# Patient Record
Sex: Male | Born: 1945
Health system: Southern US, Community
[De-identification: ages and names within clinical notes are randomized; demographics above are authoritative.]

## PROBLEM LIST (undated history)

## (undated) DIAGNOSIS — I4510 Unspecified right bundle-branch block: Secondary | ICD-10-CM

## (undated) DIAGNOSIS — M1990 Unspecified osteoarthritis, unspecified site: Secondary | ICD-10-CM

## (undated) DIAGNOSIS — C9000 Multiple myeloma not having achieved remission: Secondary | ICD-10-CM

## (undated) DIAGNOSIS — I5022 Chronic systolic (congestive) heart failure: Secondary | ICD-10-CM

## (undated) DIAGNOSIS — I10 Essential (primary) hypertension: Secondary | ICD-10-CM

## (undated) DIAGNOSIS — D472 Monoclonal gammopathy: Secondary | ICD-10-CM

## (undated) DIAGNOSIS — H269 Unspecified cataract: Secondary | ICD-10-CM

## (undated) DIAGNOSIS — E785 Hyperlipidemia, unspecified: Secondary | ICD-10-CM

## (undated) DIAGNOSIS — R55 Syncope and collapse: Secondary | ICD-10-CM

## (undated) DIAGNOSIS — Z950 Presence of cardiac pacemaker: Secondary | ICD-10-CM

## (undated) DIAGNOSIS — K219 Gastro-esophageal reflux disease without esophagitis: Secondary | ICD-10-CM

## (undated) DIAGNOSIS — D649 Anemia, unspecified: Secondary | ICD-10-CM

## (undated) DIAGNOSIS — D500 Iron deficiency anemia secondary to blood loss (chronic): Secondary | ICD-10-CM

## (undated) HISTORY — DX: Multiple myeloma not having achieved remission: C90.00

## (undated) HISTORY — DX: Anemia, unspecified: D64.9

## (undated) HISTORY — DX: Hyperlipidemia, unspecified: E78.5

## (undated) HISTORY — DX: Unspecified right bundle-branch block: I45.10

## (undated) HISTORY — DX: Gastro-esophageal reflux disease without esophagitis: K21.9

## (undated) HISTORY — DX: Monoclonal gammopathy: D47.2

## (undated) HISTORY — DX: Syncope and collapse: R55

## (undated) HISTORY — PX: EYE SURGERY: SHX253

## (undated) HISTORY — DX: Unspecified cataract: H26.9

## (undated) HISTORY — DX: Unspecified osteoarthritis, unspecified site: M19.90

## (undated) HISTORY — DX: Essential (primary) hypertension: I10

---
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
## 2009-11-08 ENCOUNTER — Ambulatory Visit: Payer: Self-pay | Admitting: Oncology

## 2009-11-19 LAB — CBC WITH DIFFERENTIAL/PLATELET
BASO%: 0.3 % (ref 0.0–2.0)
EOS%: 5.5 % (ref 0.0–7.0)
HCT: 36.5 % — ABNORMAL LOW (ref 38.4–49.9)
LYMPH%: 38.8 % (ref 14.0–49.0)
MCH: 28.6 pg (ref 27.2–33.4)
MCHC: 33.2 g/dL (ref 32.0–36.0)
MCV: 86.3 fL (ref 79.3–98.0)
MONO%: 6.6 % (ref 0.0–14.0)
NEUT%: 48.8 % (ref 39.0–75.0)
Platelets: 245 10*3/uL (ref 140–400)
RBC: 4.23 10*6/uL (ref 4.20–5.82)

## 2009-11-23 ENCOUNTER — Ambulatory Visit (HOSPITAL_COMMUNITY): Admission: RE | Admit: 2009-11-23 | Discharge: 2009-11-23 | Payer: Self-pay | Admitting: Oncology

## 2009-11-24 LAB — SPEP & IFE WITH QIG
Albumin ELP: 54 % — ABNORMAL LOW (ref 55.8–66.1)
Alpha-1-Globulin: 3.6 % (ref 2.9–4.9)
Beta 2: 5.4 % (ref 3.2–6.5)
Beta Globulin: 7.9 % — ABNORMAL HIGH (ref 4.7–7.2)
Total Protein, Serum Electrophoresis: 7.1 g/dL (ref 6.0–8.3)

## 2009-11-24 LAB — COMPREHENSIVE METABOLIC PANEL
ALT: 19 U/L (ref 0–53)
AST: 17 U/L (ref 0–37)
Alkaline Phosphatase: 42 U/L (ref 39–117)
CO2: 24 mEq/L (ref 19–32)
Creatinine, Ser: 1.02 mg/dL (ref 0.40–1.50)
Total Bilirubin: 0.3 mg/dL (ref 0.3–1.2)

## 2009-11-24 LAB — KAPPA/LAMBDA LIGHT CHAINS
Kappa free light chain: 2.04 mg/dL — ABNORMAL HIGH (ref 0.33–1.94)
Lambda Free Lght Chn: 1.52 mg/dL (ref 0.57–2.63)

## 2009-11-24 LAB — BETA 2 MICROGLOBULIN, SERUM: Beta-2 Microglobulin: 1.27 mg/L (ref 1.01–1.73)

## 2009-11-24 LAB — UIFE/LIGHT CHAINS/TP QN, 24-HR UR
Albumin, U: DETECTED
Alpha 1, Urine: DETECTED — AB
Alpha 2, Urine: DETECTED — AB
Free Kappa Lt Chains,Ur: 2.07 mg/dL — ABNORMAL HIGH (ref 0.04–1.51)
Free Kappa/Lambda Ratio: 13.8 ratio — ABNORMAL HIGH (ref 0.46–4.00)
Free Lambda Excretion/Day: 5.33 mg/d
Gamma Globulin, Urine: DETECTED — AB
Total Protein, Urine: 3.8 mg/dL

## 2009-11-24 LAB — VITAMIN B12: Vitamin B-12: 740 pg/mL (ref 211–911)

## 2009-11-24 LAB — IRON AND TIBC
%SAT: 17 % — ABNORMAL LOW (ref 20–55)
TIBC: 411 ug/dL (ref 215–435)
UIBC: 342 ug/dL

## 2009-11-24 LAB — FOLATE: Folate: >20 ng/mL

## 2009-12-13 ENCOUNTER — Ambulatory Visit: Payer: Self-pay | Admitting: Oncology

## 2009-12-15 LAB — CBC WITH DIFFERENTIAL/PLATELET
Basophils Absolute: 0.1 10*3/uL (ref 0.0–0.1)
Eosinophils Absolute: 0.5 10*3/uL (ref 0.0–0.5)
HGB: 11.8 g/dL — ABNORMAL LOW (ref 13.0–17.1)
MCV: 88.2 fL (ref 79.3–98.0)
MONO#: 0.6 10*3/uL (ref 0.1–0.9)
MONO%: 9.3 % (ref 0.0–14.0)
NEUT#: 2.9 10*3/uL (ref 1.5–6.5)
RBC: 4.03 10*6/uL — ABNORMAL LOW (ref 4.20–5.82)
RDW: 14 % (ref 11.0–14.6)
WBC: 6.4 10*3/uL (ref 4.0–10.3)

## 2010-01-31 ENCOUNTER — Ambulatory Visit: Payer: Self-pay | Admitting: Oncology

## 2010-03-21 ENCOUNTER — Ambulatory Visit: Payer: Self-pay | Admitting: Oncology

## 2010-03-23 LAB — COMPREHENSIVE METABOLIC PANEL
Albumin: 3.9 g/dL (ref 3.5–5.2)
BUN: 15 mg/dL (ref 6–23)
CO2: 27 mEq/L (ref 19–32)
Calcium: 9.4 mg/dL (ref 8.4–10.5)
Chloride: 102 mEq/L (ref 96–112)
Creatinine, Ser: 0.94 mg/dL (ref 0.40–1.50)
Glucose, Bld: 122 mg/dL — ABNORMAL HIGH (ref 70–99)
Potassium: 3.9 mEq/L (ref 3.5–5.3)

## 2010-03-23 LAB — CBC WITH DIFFERENTIAL/PLATELET
Basophils Absolute: 0 10*3/uL (ref 0.0–0.1)
EOS%: 3.3 % (ref 0.0–7.0)
Eosinophils Absolute: 0.3 10*3/uL (ref 0.0–0.5)
HCT: 39.3 % (ref 38.4–49.9)
HGB: 12.7 g/dL — ABNORMAL LOW (ref 13.0–17.1)
MCH: 28.3 pg (ref 27.2–33.4)
MONO#: 0.7 10*3/uL (ref 0.1–0.9)
NEUT#: 5.2 10*3/uL (ref 1.5–6.5)
NEUT%: 60.9 % (ref 39.0–75.0)
RDW: 14.9 % — ABNORMAL HIGH (ref 11.0–14.6)
lymph#: 2.3 10*3/uL (ref 0.9–3.3)

## 2010-03-25 LAB — PROTEIN ELECTROPHORESIS, SERUM
Albumin ELP: 51.8 % — ABNORMAL LOW (ref 55.8–66.1)
Alpha-1-Globulin: 6.9 % — ABNORMAL HIGH (ref 2.9–4.9)
Alpha-2-Globulin: 10.5 % (ref 7.1–11.8)
Gamma Globulin: 18.7 % (ref 11.1–18.8)
Total Protein, Serum Electrophoresis: 7.4 g/dL (ref 6.0–8.3)

## 2010-03-25 LAB — KAPPA/LAMBDA LIGHT CHAINS
Kappa free light chain: 1.95 mg/dL — ABNORMAL HIGH (ref 0.33–1.94)
Lambda Free Lght Chn: 1.72 mg/dL (ref 0.57–2.63)

## 2010-07-18 ENCOUNTER — Ambulatory Visit: Payer: Self-pay | Admitting: Oncology

## 2010-11-14 ENCOUNTER — Ambulatory Visit: Payer: Self-pay | Admitting: Oncology

## 2010-11-16 LAB — CBC WITH DIFFERENTIAL/PLATELET
Basophils Absolute: 0 10*3/uL (ref 0.0–0.1)
EOS%: 4.4 % (ref 0.0–7.0)
MCHC: 33.4 g/dL (ref 32.0–36.0)
MCV: 87.6 fL (ref 79.3–98.0)
NEUT#: 2.8 10*3/uL (ref 1.5–6.5)
NEUT%: 50.8 % (ref 39.0–75.0)
Platelets: 243 10*3/uL (ref 140–400)
RBC: 4.14 10*6/uL — ABNORMAL LOW (ref 4.20–5.82)
RDW: 14.4 % (ref 11.0–14.6)
WBC: 5.4 10*3/uL (ref 4.0–10.3)
lymph#: 1.9 10*3/uL (ref 0.9–3.3)

## 2010-11-18 LAB — PROTEIN ELECTROPHORESIS, SERUM
Albumin ELP: 55.7 % — ABNORMAL LOW (ref 55.8–66.1)
Alpha-1-Globulin: 3.4 % (ref 2.9–4.9)
Alpha-2-Globulin: 10.3 % (ref 7.1–11.8)
Total Protein, Serum Electrophoresis: 7.1 g/dL (ref 6.0–8.3)

## 2010-11-18 LAB — COMPREHENSIVE METABOLIC PANEL
AST: 16 U/L (ref 0–37)
BUN: 17 mg/dL (ref 6–23)
CO2: 26 mEq/L (ref 19–32)
Calcium: 9.3 mg/dL (ref 8.4–10.5)
Chloride: 97 mEq/L (ref 96–112)
Creatinine, Ser: 0.91 mg/dL (ref 0.40–1.50)

## 2010-11-18 LAB — KAPPA/LAMBDA LIGHT CHAINS
Kappa free light chain: 1.81 mg/dL (ref 0.33–1.94)
Kappa:Lambda Ratio: 1.14 (ref 0.26–1.65)
Lambda Free Lght Chn: 1.59 mg/dL (ref 0.57–2.63)

## 2011-02-06 ENCOUNTER — Other Ambulatory Visit: Payer: Self-pay | Admitting: Oncology

## 2011-02-06 ENCOUNTER — Encounter (HOSPITAL_BASED_OUTPATIENT_CLINIC_OR_DEPARTMENT_OTHER): Payer: Medicare Other | Admitting: Oncology

## 2011-02-06 DIAGNOSIS — D6489 Other specified anemias: Secondary | ICD-10-CM

## 2011-02-06 DIAGNOSIS — D649 Anemia, unspecified: Secondary | ICD-10-CM

## 2011-02-06 DIAGNOSIS — D472 Monoclonal gammopathy: Secondary | ICD-10-CM

## 2011-02-06 LAB — CBC WITH DIFFERENTIAL/PLATELET
BASO%: 0.8 % (ref 0.0–2.0)
HCT: 37 % — ABNORMAL LOW (ref 38.4–49.9)
HGB: 12.3 g/dL — ABNORMAL LOW (ref 13.0–17.1)
MCHC: 33.3 g/dL (ref 32.0–36.0)
MONO#: 0.5 10*3/uL (ref 0.1–0.9)
NEUT%: 49 % (ref 39.0–75.0)
RDW: 14.6 % (ref 11.0–14.6)
WBC: 5.8 10*3/uL (ref 4.0–10.3)
lymph#: 2.1 10*3/uL (ref 0.9–3.3)

## 2011-02-08 LAB — COMPREHENSIVE METABOLIC PANEL
AST: 19 U/L (ref 0–37)
Albumin: 4.1 g/dL (ref 3.5–5.2)
Alkaline Phosphatase: 50 U/L (ref 39–117)
Potassium: 4.5 mEq/L (ref 3.5–5.3)
Sodium: 138 mEq/L (ref 135–145)
Total Protein: 6.9 g/dL (ref 6.0–8.3)

## 2011-02-08 LAB — PROTEIN ELECTROPHORESIS, SERUM
Beta 2: 5.1 % (ref 3.2–6.5)
Beta Globulin: 7.4 % — ABNORMAL HIGH (ref 4.7–7.2)
Gamma Globulin: 19.2 % — ABNORMAL HIGH (ref 11.1–18.8)

## 2011-05-08 ENCOUNTER — Encounter (HOSPITAL_BASED_OUTPATIENT_CLINIC_OR_DEPARTMENT_OTHER): Payer: Medicare Other | Admitting: Oncology

## 2011-05-08 ENCOUNTER — Other Ambulatory Visit: Payer: Self-pay | Admitting: Oncology

## 2011-05-08 DIAGNOSIS — D6489 Other specified anemias: Secondary | ICD-10-CM

## 2011-05-08 DIAGNOSIS — D472 Monoclonal gammopathy: Secondary | ICD-10-CM

## 2011-05-08 DIAGNOSIS — D649 Anemia, unspecified: Secondary | ICD-10-CM

## 2011-05-08 LAB — CBC WITH DIFFERENTIAL/PLATELET
BASO%: 0.4 % (ref 0.0–2.0)
Basophils Absolute: 0 10*3/uL (ref 0.0–0.1)
HCT: 38.4 % (ref 38.4–49.9)
HGB: 12.9 g/dL — ABNORMAL LOW (ref 13.0–17.1)
LYMPH%: 30.6 % (ref 14.0–49.0)
MCH: 29 pg (ref 27.2–33.4)
MCHC: 33.6 g/dL (ref 32.0–36.0)
MONO#: 0.5 10*3/uL (ref 0.1–0.9)
NEUT%: 58 % (ref 39.0–75.0)
Platelets: 220 10*3/uL (ref 140–400)
WBC: 6.2 10*3/uL (ref 4.0–10.3)

## 2011-05-10 LAB — PROTEIN ELECTROPHORESIS, SERUM
Albumin ELP: 53.9 % — ABNORMAL LOW (ref 55.8–66.1)
Alpha-1-Globulin: 3.5 % (ref 2.9–4.9)
Alpha-2-Globulin: 10.5 % (ref 7.1–11.8)
Beta Globulin: 7.3 % — ABNORMAL HIGH (ref 4.7–7.2)
Total Protein, Serum Electrophoresis: 7.4 g/dL (ref 6.0–8.3)

## 2011-05-10 LAB — KAPPA/LAMBDA LIGHT CHAINS
Kappa free light chain: 2.14 mg/dL — ABNORMAL HIGH (ref 0.33–1.94)
Kappa:Lambda Ratio: 1.37 (ref 0.26–1.65)
Lambda Free Lght Chn: 1.56 mg/dL (ref 0.57–2.63)

## 2011-05-10 LAB — COMPREHENSIVE METABOLIC PANEL
ALT: 17 U/L (ref 0–53)
BUN: 13 mg/dL (ref 6–23)
CO2: 19 mEq/L (ref 19–32)
Calcium: 9.6 mg/dL (ref 8.4–10.5)
Chloride: 102 mEq/L (ref 96–112)
Creatinine, Ser: 0.88 mg/dL (ref 0.40–1.50)
Glucose, Bld: 167 mg/dL — ABNORMAL HIGH (ref 70–99)
Total Bilirubin: 0.4 mg/dL (ref 0.3–1.2)

## 2011-08-07 ENCOUNTER — Other Ambulatory Visit: Payer: Self-pay | Admitting: Oncology

## 2011-08-07 ENCOUNTER — Encounter (HOSPITAL_BASED_OUTPATIENT_CLINIC_OR_DEPARTMENT_OTHER): Payer: Medicare Other | Admitting: Oncology

## 2011-08-07 DIAGNOSIS — D649 Anemia, unspecified: Secondary | ICD-10-CM

## 2011-08-07 DIAGNOSIS — D472 Monoclonal gammopathy: Secondary | ICD-10-CM

## 2011-08-07 DIAGNOSIS — D6489 Other specified anemias: Secondary | ICD-10-CM

## 2011-08-07 LAB — CBC WITH DIFFERENTIAL/PLATELET
Basophils Absolute: 0 10*3/uL (ref 0.0–0.1)
Eosinophils Absolute: 0.3 10*3/uL (ref 0.0–0.5)
HGB: 12.2 g/dL — ABNORMAL LOW (ref 13.0–17.1)
MCV: 86.8 fL (ref 79.3–98.0)
MONO%: 9.2 % (ref 0.0–14.0)
NEUT#: 3.1 10*3/uL (ref 1.5–6.5)
RBC: 4.19 10*6/uL — ABNORMAL LOW (ref 4.20–5.82)
RDW: 14.6 % (ref 11.0–14.6)
WBC: 6.1 10*3/uL (ref 4.0–10.3)
lymph#: 2.1 10*3/uL (ref 0.9–3.3)

## 2011-08-09 LAB — COMPREHENSIVE METABOLIC PANEL
AST: 21 U/L (ref 0–37)
Albumin: 3.7 g/dL (ref 3.5–5.2)
Alkaline Phosphatase: 45 U/L (ref 39–117)
BUN: 18 mg/dL (ref 6–23)
Calcium: 9.6 mg/dL (ref 8.4–10.5)
Chloride: 104 mEq/L (ref 96–112)
Glucose, Bld: 95 mg/dL (ref 70–99)
Potassium: 4.2 mEq/L (ref 3.5–5.3)
Sodium: 136 mEq/L (ref 135–145)
Total Protein: 6.9 g/dL (ref 6.0–8.3)

## 2011-08-09 LAB — PROTEIN ELECTROPHORESIS, SERUM
Albumin ELP: 51.4 % — ABNORMAL LOW (ref 55.8–66.1)
Beta 2: 5.7 % (ref 3.2–6.5)
Gamma Globulin: 19.4 % — ABNORMAL HIGH (ref 11.1–18.8)

## 2011-08-09 LAB — KAPPA/LAMBDA LIGHT CHAINS: Kappa free light chain: 3.92 mg/dL — ABNORMAL HIGH (ref 0.33–1.94)

## 2011-10-03 ENCOUNTER — Encounter: Payer: Self-pay | Admitting: *Deleted

## 2011-10-10 ENCOUNTER — Encounter: Payer: Self-pay | Admitting: Oncology

## 2011-10-10 DIAGNOSIS — K219 Gastro-esophageal reflux disease without esophagitis: Secondary | ICD-10-CM | POA: Insufficient documentation

## 2011-10-10 DIAGNOSIS — D472 Monoclonal gammopathy: Secondary | ICD-10-CM | POA: Insufficient documentation

## 2011-10-10 DIAGNOSIS — E0829 Diabetes mellitus due to underlying condition with other diabetic kidney complication: Secondary | ICD-10-CM | POA: Insufficient documentation

## 2011-10-10 DIAGNOSIS — I10 Essential (primary) hypertension: Secondary | ICD-10-CM | POA: Insufficient documentation

## 2011-10-10 DIAGNOSIS — E785 Hyperlipidemia, unspecified: Secondary | ICD-10-CM | POA: Insufficient documentation

## 2011-11-06 ENCOUNTER — Other Ambulatory Visit (HOSPITAL_BASED_OUTPATIENT_CLINIC_OR_DEPARTMENT_OTHER): Payer: Medicare Other | Admitting: Lab

## 2011-11-06 DIAGNOSIS — D472 Monoclonal gammopathy: Secondary | ICD-10-CM

## 2011-11-06 LAB — CBC WITH DIFFERENTIAL/PLATELET
BASO%: 0.3 % (ref 0.0–2.0)
Eosinophils Absolute: 0.4 10*3/uL (ref 0.0–0.5)
LYMPH%: 33.3 % (ref 14.0–49.0)
MCHC: 33.5 g/dL (ref 32.0–36.0)
MONO#: 0.5 10*3/uL (ref 0.1–0.9)
NEUT#: 3.7 10*3/uL (ref 1.5–6.5)
Platelets: 231 10*3/uL (ref 140–400)
RBC: 4.42 10*6/uL (ref 4.20–5.82)
RDW: 14.1 % (ref 11.0–14.6)
WBC: 6.9 10*3/uL (ref 4.0–10.3)
lymph#: 2.3 10*3/uL (ref 0.9–3.3)

## 2011-11-08 LAB — KAPPA/LAMBDA LIGHT CHAINS: Kappa free light chain: 2.36 mg/dL — ABNORMAL HIGH (ref 0.33–1.94)

## 2011-11-08 LAB — COMPREHENSIVE METABOLIC PANEL
ALT: 26 U/L (ref 0–53)
Albumin: 3.5 g/dL (ref 3.5–5.2)
CO2: 28 mEq/L (ref 19–32)
Potassium: 4.3 mEq/L (ref 3.5–5.3)
Sodium: 138 mEq/L (ref 135–145)
Total Bilirubin: 0.2 mg/dL — ABNORMAL LOW (ref 0.3–1.2)
Total Protein: 6.4 g/dL (ref 6.0–8.3)

## 2011-11-08 LAB — PROTEIN ELECTROPHORESIS, SERUM
Beta 2: 5.7 % (ref 3.2–6.5)
Beta Globulin: 7.5 % — ABNORMAL HIGH (ref 4.7–7.2)
M-Spike, %: 0.44 g/dL
Total Protein, Serum Electrophoresis: 6.4 g/dL (ref 6.0–8.3)

## 2011-11-13 ENCOUNTER — Ambulatory Visit (HOSPITAL_BASED_OUTPATIENT_CLINIC_OR_DEPARTMENT_OTHER): Payer: Medicare Other | Admitting: Oncology

## 2011-11-13 ENCOUNTER — Telehealth: Payer: Self-pay | Admitting: Oncology

## 2011-11-13 VITALS — BP 166/111 | HR 86 | Temp 97.0°F | Ht 75.5 in | Wt 234.9 lb

## 2011-11-13 DIAGNOSIS — D649 Anemia, unspecified: Secondary | ICD-10-CM

## 2011-11-13 DIAGNOSIS — R030 Elevated blood-pressure reading, without diagnosis of hypertension: Secondary | ICD-10-CM

## 2011-11-13 DIAGNOSIS — R0789 Other chest pain: Secondary | ICD-10-CM

## 2011-11-13 DIAGNOSIS — E119 Type 2 diabetes mellitus without complications: Secondary | ICD-10-CM

## 2011-11-13 DIAGNOSIS — Z87891 Personal history of nicotine dependence: Secondary | ICD-10-CM

## 2011-11-13 DIAGNOSIS — D509 Iron deficiency anemia, unspecified: Secondary | ICD-10-CM

## 2011-11-13 DIAGNOSIS — D472 Monoclonal gammopathy: Secondary | ICD-10-CM

## 2011-11-13 DIAGNOSIS — Z129 Encounter for screening for malignant neoplasm, site unspecified: Secondary | ICD-10-CM

## 2011-11-13 NOTE — Progress Notes (Signed)
[facility] OFFICE PROGRESS NOTE  DIAGNOSIS AND CURRENT THERAPIES:    1.  Monoclonal gammopathy of unknown significant (MGUS); on watchful observation. 2.  Iron deficiency anemia with negative GI work up.  He is on oral iron supplementation.  INTERVAL HISTORY: Michael Wiggins 65 y.o. male returns for regular follow up.  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  He has been having mild bilateral knee pain and occasional bilateral low anterior rib pain. He has good appetite, and has been having weight gain.  He denies cough, hemoptysis, chest pain, shortness of breath, hematemasis, abdominal pain, melena, hematochezia, hematuria, low back pain, bowel bladder incontinence, lower extremity weakness.  He has been having intermittent rash on the diet lateral Richard his nose and eyebrow for many years. This skin rash gets worse with dry weather and improves when he uses Vaseline.  He denies pleurisy, abdominal pain, swelling or pain in his finger joints.   MEDICAL HISTORY: Past Medical History  Diagnosis Date  . Diabetes mellitus   . Hyperlipidemia   . Hypertension   . Osteoarthritis   . MGUS (monoclonal gammopathy of unknown significance)   . GERD (gastroesophageal reflux disease)     SURGICAL HISTORY: No past surgical history on file.  MEDICATIONS: Current Outpatient Prescriptions  Medication Sig Dispense Refill  . atorvastatin (LIPITOR) 10 MG tablet Take 10 mg by mouth daily.        Marland Kitchen BENICAR HCT 40-12.5 MG per tablet       . CINNAMON PO Take 1,000 mg by mouth daily.        Marland Kitchen glipiZIDE (GLUCOTROL) 5 MG tablet Take 5 mg by mouth 2 (two) times daily before a meal.        . meloxicam (MOBIC) 15 MG tablet       . metFORMIN (GLUCOPHAGE) 1000 MG tablet Take 1,000 mg by mouth 2 (two) times daily with a meal.        . Multiple Vitamin (MULTIVITAMIN) tablet Take 1 tablet by mouth daily.        Marland Kitchen aspirin 81 MG tablet Take 81 mg by mouth daily.         . fish oil-omega-3 fatty acids 1000 MG capsule Take 1 capsule by mouth daily.          ALLERGIES:   has no known allergies.  REVIEW OF SYSTEMS:  The rest of the 14-point review of system was negative.   Filed Vitals:   11/13/11 1042  BP: 166/111  Pulse:   Temp:    Wt Readings from Last 3 Encounters:  11/13/11 234 lb 14.4 oz (106.55 kg)  11/16/10 226 lb 14.4 oz (102.921 kg)   ECOG Performance status: 0  PHYSICAL EXAMINATION:  General:  well-nourished in no acute distress.  Eyes:  no scleral icterus.  ENT:  There were no oropharyngeal lesions.  Neck was without thyromegaly.  Lymphatics:  Negative cervical, supraclavicular or axillary adenopathy.  Respiratory: lungs were clear bilaterally without wheezing or crackles.  Cardiovascular:  Regular rate and rhythm, S1/S2, without murmur, rub or gallop.  There was no pedal edema.  GI:  abdomen was soft, flat, nontender, nondistended, without organomegaly.  Muscoloskeletal:  no spinal tenderness of palpation of vertebral spine.  Skin exam showed dry silvery flaky skin with mild erythema in his bilateral nasal ridges and eyebrows.  Neuro exam was nonfocal.  Patient was able to get on and off exam table without  assistance.  Gait was normal.  Patient was alerted and oriented.  Attention was good.   Language was appropriate.  Mood was normal without depression.  Speech was not pressured.  Thought content was not tangential.    LABORATORY/RADIOLOGY DATA:  Lab Results  Component Value Date   WBC 6.9 11/06/2011   HGB 12.9* 11/06/2011   HCT 38.6 11/06/2011   PLT 231 11/06/2011   GLUCOSE 113* 11/06/2011   ALT 26 11/06/2011   AST 28 11/06/2011   NA 138 11/06/2011   K 4.3 11/06/2011   CL 103 11/06/2011   CREATININE 0.89 11/06/2011   BUN 14 11/06/2011   CO2 28 11/06/2011   TSH 1.135 11/19/2009   SPEP:  M-spike at 0.44; and normal kappa:lambda free light chain ratio.   ASSESSMENT AND PLAN:   1.  MGUS:  I discussed with Michael Wiggins that  there is no indication of MGUS progression into active myeloma given that his M spike is improving and normal light chain ratio. He does not have anemia, renal insufficiency or hypercalcemia to suggest active myeloma this time. I recommended again observation with lab test with M spike and light chains in 5 months with visit with me the week after.  2.  Iron deficiency anemia:  Negative colonoscopy and EGD.  He had some benign polyps.  His EGD showed some esophagitis.  His next screening colonoscopy is around 2015.  I advised him to continue his oral iron supplementation.   3.  GERD:  I advised him not to have to have heavy dinner in addition to give at least 3 hours between dinner and going to bed at night to improve his reflux symptoms.    4.  hyperlipidemia: He is on fish and atorvastatin.  5. Diabetes mellitus type 2 he is on metformin and glipizide per PCP.  6.  HTN:  His blood pressure was elevated today. He claims that this is whitecoat syndrome since at home his blood pressure is around 130/90. I advised him to check with his PCP to see whether his antihypertensive medication he to be titrated as appropriate.  7.  Follow up:  In 5 months.   8.  Primary care:  He has already had a flu shot this year.   He smoked up to 2 packs a day of cigarettes between the age of of 83 and 29.  His mother died from lung cancer. According to NCCN guideline, he is at higher risk of lung cancer.  Per NCCN guideline, he qualifies for low radiation CT chest for lung cancer screening.  I ordered this for within the next 1 month.

## 2011-11-13 NOTE — Telephone Encounter (Signed)
lmonvm advising the pt of his lab appt in April and the md appt in may 2013

## 2011-12-05 ENCOUNTER — Inpatient Hospital Stay (HOSPITAL_COMMUNITY)
Admission: RE | Admit: 2011-12-05 | Discharge: 2011-12-05 | Payer: BC Managed Care – PPO | Source: Ambulatory Visit | Attending: Oncology | Admitting: Oncology

## 2011-12-13 ENCOUNTER — Telehealth: Payer: Self-pay | Admitting: Oncology

## 2011-12-13 NOTE — Telephone Encounter (Signed)
Pt called to r/s his ct scan appt that he missed. gve the pt the number to rad scheduling

## 2011-12-14 NOTE — Progress Notes (Signed)
Patient cancelled last chest CT; left message with patient to call office to reschedule CT, per Dr. Gaylyn Rong.

## 2011-12-26 ENCOUNTER — Ambulatory Visit (HOSPITAL_COMMUNITY)
Admission: RE | Admit: 2011-12-26 | Discharge: 2011-12-26 | Disposition: A | Discharge status: Home / Self Care | Payer: Medicare Other | Source: Ambulatory Visit | Attending: Oncology | Admitting: Oncology

## 2011-12-26 DIAGNOSIS — Z129 Encounter for screening for malignant neoplasm, site unspecified: Secondary | ICD-10-CM

## 2011-12-26 DIAGNOSIS — Z87891 Personal history of nicotine dependence: Secondary | ICD-10-CM

## 2011-12-26 DIAGNOSIS — I2510 Atherosclerotic heart disease of native coronary artery without angina pectoris: Secondary | ICD-10-CM | POA: Insufficient documentation

## 2011-12-26 DIAGNOSIS — J438 Other emphysema: Secondary | ICD-10-CM | POA: Insufficient documentation

## 2011-12-26 DIAGNOSIS — R059 Cough, unspecified: Secondary | ICD-10-CM | POA: Insufficient documentation

## 2011-12-26 DIAGNOSIS — R0789 Other chest pain: Secondary | ICD-10-CM

## 2011-12-26 DIAGNOSIS — R05 Cough: Secondary | ICD-10-CM | POA: Insufficient documentation

## 2011-12-26 MED ORDER — IOHEXOL 300 MG/ML  SOLN
80.0000 mL | Freq: Once | INTRAMUSCULAR | Status: AC | PRN
  Administered 2011-12-26: 80 mL via INTRAVENOUS

## 2012-01-08 ENCOUNTER — Telehealth: Payer: Self-pay | Admitting: *Deleted

## 2012-01-08 ENCOUNTER — Ambulatory Visit (INDEPENDENT_AMBULATORY_CARE_PROVIDER_SITE_OTHER): Payer: Medicare Other | Admitting: Internal Medicine

## 2012-01-08 DIAGNOSIS — E782 Mixed hyperlipidemia: Secondary | ICD-10-CM

## 2012-01-08 DIAGNOSIS — E119 Type 2 diabetes mellitus without complications: Secondary | ICD-10-CM

## 2012-01-08 DIAGNOSIS — E8809 Other disorders of plasma-protein metabolism, not elsewhere classified: Secondary | ICD-10-CM

## 2012-01-08 DIAGNOSIS — Z79899 Other long term (current) drug therapy: Secondary | ICD-10-CM

## 2012-01-08 NOTE — Telephone Encounter (Signed)
Please call pt and let him know that the CT chest was clear.  No sign of lung cancer.  Thanks

## 2012-01-08 NOTE — Telephone Encounter (Signed)
Received call from pt stating that he had a ct chest done about 10 days ago and he would like to know the results. Informed pt that MD out of office until Wednesday but this nurse would notify MD via in basket of result request. Verbalized understanding

## 2012-01-11 ENCOUNTER — Telehealth: Payer: Self-pay | Admitting: *Deleted

## 2012-01-11 NOTE — Telephone Encounter (Signed)
Pt called back for results of CT scan.  Informed him that per Dr. Gaylyn Rong his CT chest was clear and no signs of lung cancer.  Pt verbalized understanding.

## 2012-01-18 ENCOUNTER — Telehealth: Payer: Self-pay

## 2012-01-18 NOTE — Telephone Encounter (Addendum)
.  [facility] PT WAS REFERRED TO ANOTHER DR AND WOULD LIKE TO SPEAK WITH SOMEONE WHAT THE FINDINGS WERE. PLEASE CALL [phone] IT WAS ABOUT HIS IRON AND ANEMIA LEVELS

## 2012-01-19 NOTE — Telephone Encounter (Signed)
Pt will return call. Chart in box behind TL

## 2012-01-19 NOTE — Telephone Encounter (Signed)
Called pt back as promised shortly after 5:00 pm, but no answer. LMOM at mobile and home to [facility].

## 2012-01-20 NOTE — Telephone Encounter (Signed)
Dr. Perrin Maltese wants to see the patient.

## 2012-01-20 NOTE — Telephone Encounter (Signed)
Pt came in and stated that he was actually trying to get in touch with Dr. Perrin Maltese regarding a butterfly shaped area of redness on his nose.  Dr. Gaylyn Rong noticed this and pt wanted Dr. Leodis Liverpool advice.  Also wanted [place] to know that Dr. Gaylyn Rong knows about his anemia since Oct 2012, and he is on Iron 30mg /day.  Pls advise.

## 2012-01-20 NOTE — Telephone Encounter (Signed)
LMOM for pt to call back.  ?Is question re: urology referral for rising PSA?  Please get questions.Marland KitchenMarland Kitchen

## 2012-01-21 NOTE — Telephone Encounter (Signed)
Patient notified and will rehcheck for this.

## 2012-02-26 ENCOUNTER — Telehealth: Payer: Self-pay

## 2012-02-26 NOTE — Telephone Encounter (Signed)
.  [facility] PT WOULD LIKE TO HAVE THE NEW SUGAR TEST ALONG WITH THE TEST SCRIPTS. STATES HIS IS SO OLD AND HE CAN GET IT FREE IF HE CAN GET THE NEW ONE PLEASE CALL [phone]  [facility] [address]

## 2012-02-27 NOTE — Telephone Encounter (Signed)
Who follows the pt's Diabetes - when was the last DM OV?  If not within 3 months - needs ov - if within last 3 months - what meter does he want?

## 2012-02-27 NOTE — Telephone Encounter (Signed)
Spoke with patient, last OV for DM was in September.  Patient states he will make appt to followup.  He was wanting the Accucheck Nano, but will check with his insurance first to see what they will cover.  He is currently testing once daily, and will call back after talking with insurance.

## 2012-02-27 NOTE — Telephone Encounter (Signed)
Can we rx new meter, strips and lancets for patient?

## 2012-03-16 ENCOUNTER — Other Ambulatory Visit: Payer: Self-pay | Admitting: Internal Medicine

## 2012-03-17 ENCOUNTER — Other Ambulatory Visit: Payer: Self-pay | Admitting: Physician Assistant

## 2012-03-17 MED ORDER — GLIPIZIDE 5 MG PO TABS: 5.0000 mg | ORAL_TABLET | Freq: Two times a day (BID) | ORAL | Status: DC

## 2012-03-17 NOTE — Telephone Encounter (Signed)
Pull chart please.  Michael Wiggins 

## 2012-03-18 ENCOUNTER — Other Ambulatory Visit: Payer: Self-pay

## 2012-03-18 ENCOUNTER — Other Ambulatory Visit: Payer: Self-pay | Admitting: *Deleted

## 2012-03-18 MED ORDER — ATORVASTATIN CALCIUM 20 MG PO TABS: 20.0000 mg | ORAL_TABLET | Freq: Every day | ORAL | Status: DC

## 2012-04-10 ENCOUNTER — Telehealth: Payer: Self-pay | Admitting: Oncology

## 2012-04-10 NOTE — Telephone Encounter (Signed)
Pt called to cx 4/30 lb appt and moved f/u to after 5/6 due to he is having a complete physical w/primay 5/6 and will have lbs from physical sent to [facility]. Lb for 4/30 cx per pt and f/u moved to 5/13. Message sent to desk nurse.

## 2012-04-11 ENCOUNTER — Other Ambulatory Visit: Payer: BC Managed Care – PPO | Admitting: Lab

## 2012-04-14 ENCOUNTER — Other Ambulatory Visit: Payer: Self-pay | Admitting: Physician Assistant

## 2012-04-18 ENCOUNTER — Ambulatory Visit: Payer: BC Managed Care – PPO | Admitting: Oncology

## 2012-04-29 ENCOUNTER — Ambulatory Visit: Payer: BC Managed Care – PPO | Admitting: Oncology

## 2012-05-06 ENCOUNTER — Other Ambulatory Visit: Payer: Self-pay | Admitting: Internal Medicine

## 2012-05-06 ENCOUNTER — Ambulatory Visit (INDEPENDENT_AMBULATORY_CARE_PROVIDER_SITE_OTHER): Payer: Medicare Other | Admitting: Internal Medicine

## 2012-05-06 ENCOUNTER — Encounter: Payer: Self-pay | Admitting: Internal Medicine

## 2012-05-06 VITALS — BP 159/95 | HR 82 | Temp 96.9°F | Resp 16 | Ht 73.5 in | Wt 222.0 lb

## 2012-05-06 DIAGNOSIS — E782 Mixed hyperlipidemia: Secondary | ICD-10-CM

## 2012-05-06 DIAGNOSIS — Z0000 Encounter for general adult medical examination without abnormal findings: Secondary | ICD-10-CM

## 2012-05-06 DIAGNOSIS — Z23 Encounter for immunization: Secondary | ICD-10-CM

## 2012-05-06 DIAGNOSIS — E119 Type 2 diabetes mellitus without complications: Secondary | ICD-10-CM

## 2012-05-06 DIAGNOSIS — I10 Essential (primary) hypertension: Secondary | ICD-10-CM

## 2012-05-06 DIAGNOSIS — Z79899 Other long term (current) drug therapy: Secondary | ICD-10-CM

## 2012-05-06 DIAGNOSIS — D649 Anemia, unspecified: Secondary | ICD-10-CM

## 2012-05-06 LAB — POCT URINALYSIS DIPSTICK
Bilirubin, UA: NEGATIVE
Glucose, UA: NEGATIVE
Ketones, UA: NEGATIVE
Leukocytes, UA: NEGATIVE
Spec Grav, UA: 1.01

## 2012-05-06 LAB — COMPREHENSIVE METABOLIC PANEL
ALT: 18 U/L (ref 0–53)
Albumin: 3.8 g/dL (ref 3.5–5.2)
CO2: 27 mEq/L (ref 19–32)
Calcium: 9.7 mg/dL (ref 8.4–10.5)
Chloride: 102 mEq/L (ref 96–112)
Glucose, Bld: 160 mg/dL — ABNORMAL HIGH (ref 70–99)
Potassium: 4.4 mEq/L (ref 3.5–5.3)
Sodium: 137 mEq/L (ref 135–145)
Total Bilirubin: 0.4 mg/dL (ref 0.3–1.2)
Total Protein: 6.8 g/dL (ref 6.0–8.3)

## 2012-05-06 LAB — GLUCOSE, POCT (MANUAL RESULT ENTRY): POC Glucose: 171 mg/dl — AB (ref 70–99)

## 2012-05-06 NOTE — Progress Notes (Signed)
  Subjective:    Patient ID: Michael Rabideau Sr., male    DOB: 10/04/1946, 66 y.o.   MRN: [id number]  HPI Recent stress test nl, chest CT scan also nl, Had bilateral catarract surgery and went well. See scanned hx  Diabetes is doing better, exercing more, lost weight Review of Systems See scanned ROS    Objective:   Physical Exam  Constitutional: He is oriented to person, place, and time. He appears well-developed and well-nourished.  HENT:  Right Ear: External ear normal.  Left Ear: External ear normal.  Mouth/Throat: Oropharynx is clear and moist.  Eyes: EOM are normal. Pupils are equal, round, and reactive to light.  Neck: Normal range of motion. Neck supple. No tracheal deviation present. No thyromegaly present.  Cardiovascular: Normal rate, regular rhythm, normal heart sounds and intact distal pulses.   No murmur heard. Pulmonary/Chest: Effort normal and breath sounds normal. No respiratory distress.  Abdominal: Soft. Bowel sounds are normal. He exhibits no mass. There is no tenderness.  Genitourinary: Rectum normal, prostate normal and penis normal.  Musculoskeletal: Normal range of motion. He exhibits tenderness.  Lymphadenopathy:    He has no cervical adenopathy.  Neurological: He is alert and oriented to person, place, and time. He displays normal reflexes. Coordination normal.  Skin: Skin is warm. No erythema.  Psychiatric: He has a normal mood and affect.   138/88  Results for orders placed in visit on 05/06/12  POCT URINALYSIS DIPSTICK      Component Value Range   Color, UA yellow     Clarity, UA clear     Glucose, UA neg     Bilirubin, UA neg     Ketones, UA neg     Spec Grav, UA 1.010     Blood, UA trace     pH, UA 7.0     Protein, UA 100     Urobilinogen, UA 0.2     Nitrite, UA neg     Leukocytes, UA Negative    GLUCOSE, POCT (MANUAL RESULT ENTRY)      Component Value Range   POC Glucose 171 (*) 70 - 99 (mg/dl)  POCT GLYCOSYLATED HEMOGLOBIN (HGB A1C)     Component Value Range   Hemoglobin A1C 8.6          Assessment & Plan:  Diabetes not to goal Glipyzide 10mg  am, 5mg  pm with meals RTC 1 month reck RF all meds 1 yr See nutrionist again

## 2012-05-08 ENCOUNTER — Other Ambulatory Visit: Payer: Self-pay

## 2012-05-08 MED ORDER — GLIPIZIDE 10 MG PO TABS: 10.0000 mg | ORAL_TABLET | Freq: Every morning | ORAL | Status: DC

## 2012-05-08 NOTE — Telephone Encounter (Signed)
[facility] Pt called and stated Dr Perrin Maltese wanted him to take 10 mg in the am and 5 mg at night. He only received 5 mg from pharmacy so Helmut Muster authorized verbally to send in separate RX for pt.

## 2012-05-10 ENCOUNTER — Encounter: Payer: Self-pay | Admitting: Oncology

## 2012-05-10 ENCOUNTER — Ambulatory Visit: Payer: BC Managed Care – PPO | Admitting: Oncology

## 2012-05-20 ENCOUNTER — Other Ambulatory Visit: Payer: Self-pay | Admitting: Internal Medicine

## 2012-06-03 ENCOUNTER — Other Ambulatory Visit: Payer: Self-pay | Admitting: Physician Assistant

## 2012-06-30 ENCOUNTER — Other Ambulatory Visit: Payer: Self-pay | Admitting: Internal Medicine

## 2012-07-08 ENCOUNTER — Ambulatory Visit (INDEPENDENT_AMBULATORY_CARE_PROVIDER_SITE_OTHER): Payer: Medicare Other | Admitting: Internal Medicine

## 2012-07-08 ENCOUNTER — Other Ambulatory Visit: Payer: Self-pay | Admitting: *Deleted

## 2012-07-08 ENCOUNTER — Encounter: Payer: Self-pay | Admitting: Internal Medicine

## 2012-07-08 VITALS — BP 153/90 | HR 63 | Temp 97.5°F | Resp 16 | Ht 73.5 in | Wt 220.8 lb

## 2012-07-08 DIAGNOSIS — E162 Hypoglycemia, unspecified: Secondary | ICD-10-CM

## 2012-07-08 DIAGNOSIS — Z79899 Other long term (current) drug therapy: Secondary | ICD-10-CM

## 2012-07-08 LAB — GLUCOSE, POCT (MANUAL RESULT ENTRY): POC Glucose: 148 mg/dl — AB (ref 70–99)

## 2012-07-08 MED ORDER — GLIPIZIDE 10 MG PO TABS: 10.0000 mg | ORAL_TABLET | Freq: Every morning | ORAL | Status: DC

## 2012-07-08 NOTE — Progress Notes (Signed)
  Subjective:    Patient ID: Michael Willis Sr., male    DOB: Oct 03, 1946, 66 y.o.   MRN: [id number]  HPI Having low blood sugar with 10mg  glypizide Understands how and when to use 10mg  or 5 mg. Works hard in the heat, lost wt. Not sleeping well   Review of Systems     Objective:   Physical Exam Normal  Results for orders placed in visit on 07/08/12  POCT GLYCOSYLATED HEMOGLOBIN (HGB A1C)      Component Value Range   Hemoglobin A1C 8.0    GLUCOSE, POCT (MANUAL RESULT ENTRY)      Component Value Range   POC Glucose 148 (*) 70 - 99 mg/dl   improved    Assessment & Plan:  Insomnia care Careful use of 10mg  glypizide RTC 4 mo RF meds 1 yr

## 2012-07-08 NOTE — Patient Instructions (Addendum)
Hypoglycemia (Low Blood Sugar) Hypoglycemia is when the glucose (sugar) in your blood is too low. Hypoglycemia can happen for many reasons. It can happen to people with or without diabetes. Hypoglycemia can develop quickly and can be a medical emergency.  CAUSES  Having hypoglycemia does not mean that you will develop diabetes. Different causes include:  Missed or delayed meals or not enough carbohydrates eaten.   Medication overdose. This could be by accident or deliberate. If by accident, your medication may need to be adjusted or changed.   Exercise or increased activity without adjustments in carbohydrates or medications.   A nerve disorder that affects body functions like your heart rate, blood pressure and digestion (autonomic neuropathy).   A condition where the stomach muscles do not function properly (gastroparesis). Therefore, medications may not absorb properly.   The inability to recognize the signs of hypoglycemia (hypoglycemic unawareness).   Absorption of insulin - may be altered.   Alcohol consumption.   Pregnancy/menstrual cycles/postpartum. This may be due to hormones.   Certain kinds of tumors. This is very rare.  SYMPTOMS   Sweating.   Hunger.   Dizziness.   Blurred vision.   Drowsiness.   Weakness.   Headache.   Rapid heart beat.   Shakiness.   Nervousness.  DIAGNOSIS  Diagnosis is made by monitoring blood glucose in one or all of the following ways:  Fingerstick blood glucose monitoring.   Laboratory results.  TREATMENT  If you think your blood glucose is low:  Check your blood glucose, if possible. If it is less than 70 mg/dl, take one of the following:   3-4 glucose tablets.    cup juice (prefer clear like apple).    cup "regular" soda pop.   1 cup milk.   -1 tube of glucose gel.   5-6 hard candies.   Do not over treat because your blood glucose (sugar) will only go too high.   Wait 15 minutes and recheck your blood  glucose. If it is still less than 70 mg/dl (or below your target range), repeat treatment.   Eat a snack if it is more than one hour until your next meal.  Sometimes, your blood glucose may go so low that you are unable to treat yourself. You may need someone to help you. You may even pass out or be unable to swallow. This may require you to get an injection of glucagon, which raises the blood glucose. HOME CARE INSTRUCTIONS  Check blood glucose as recommended by your caregiver.   Take medication as prescribed by your caregiver.   Follow your meal plan. Do not skip meals. Eat on time.   If you are going to drink alcohol, drink it only with meals.   Check your blood glucose before driving.   Check your blood glucose before and after exercise. If you exercise longer or different than usual, be sure to check blood glucose more frequently.   Always carry treatment with you. Glucose tablets are the easiest to carry.   Always wear medical alert jewelry or carry some form of identification that states that you have diabetes. This will alert people that you have diabetes. If you have hypoglycemia, they will have a better idea on what to do.  SEEK MEDICAL CARE IF:   You are having problems keeping your blood sugar at target range.   You are having frequent episodes of hypoglycemia.   You feel you might be having side effects from your medicines.  You have symptoms of an illness that is not improving after 3-4 days.   You notice a change in vision or a new problem with your vision.  SEEK IMMEDIATE MEDICAL CARE IF:   You are a family member or friend of a person whose blood glucose goes below 70 mg/dl and is accompanied by:   Confusion.   A change in mental status.   The inability to swallow.   Passing out.  Document Released: 12/04/2005 Document Revised: 11/23/2011 Document Reviewed: 07/29/2009 [facility] Patient Information 2012 [place], [place].Insomnia Insomnia is frequent trouble  falling and/or staying asleep. Insomnia can be a long term problem or a short term problem. Both are common. Insomnia can be a short term problem when the wakefulness is related to a certain stress or worry. Long term insomnia is often related to ongoing stress during waking hours and/or poor sleeping habits. Overtime, sleep deprivation itself can make the problem worse. Every little thing feels more severe because you are overtired and your ability to cope is decreased. CAUSES   Stress, anxiety, and depression.   Poor sleeping habits.   Distractions such as TV in the bedroom.   Naps close to bedtime.   Engaging in emotionally charged conversations before bed.   Technical reading before sleep.   Alcohol and other sedatives. They may make the problem worse. They can hurt normal sleep patterns and normal dream activity.   Stimulants such as caffeine for several hours prior to bedtime.   Pain syndromes and shortness of breath can cause insomnia.   Exercise late at night.   Changing time zones may cause sleeping problems (jet lag).  It is sometimes helpful to have someone observe your sleeping patterns. They should look for periods of not breathing during the night (sleep apnea). They should also look to see how long those periods last. If you live alone or observers are uncertain, you can also be observed at a sleep clinic where your sleep patterns will be professionally monitored. Sleep apnea requires a checkup and treatment. Give your caregivers your medical history. Give your caregivers observations your family has made about your sleep.  SYMPTOMS   Not feeling rested in the morning.   Anxiety and restlessness at bedtime.   Difficulty falling and staying asleep.  TREATMENT   Your caregiver may prescribe treatment for an underlying medical disorders. Your caregiver can give advice or help if you are using alcohol or other drugs for self-medication. Treatment of underlying problems  will usually eliminate insomnia problems.   Medications can be prescribed for short time use. They are generally not recommended for lengthy use.   Over-the-counter sleep medicines are not recommended for lengthy use. They can be habit forming.   You can promote easier sleeping by making lifestyle changes such as:   Using relaxation techniques that help with breathing and reduce muscle tension.   Exercising earlier in the day.   Changing your diet and the time of your last meal. No night time snacks.   Establish a regular time to go to bed.   Counseling can help with stressful problems and worry.   Soothing music and white noise may be helpful if there are background noises you cannot remove.   Stop tedious detailed work at least one hour before bedtime.  HOME CARE INSTRUCTIONS   Keep a diary. Inform your caregiver about your progress. This includes any medication side effects. See your caregiver regularly. Take note of:   Times when you are asleep.  Times when you are awake during the night.   The quality of your sleep.   How you feel the next day.  This information will help your caregiver care for you.  Get out of bed if you are still awake after 15 minutes. Read or do some quiet activity. Keep the lights down. Wait until you feel sleepy and go back to bed.   Keep regular sleeping and waking hours. Avoid naps.   Exercise regularly.   Avoid distractions at bedtime. Distractions include watching television or engaging in any intense or detailed activity like attempting to balance the household checkbook.   Develop a bedtime ritual. Keep a familiar routine of bathing, brushing your teeth, climbing into bed at the same time each night, listening to soothing music. Routines increase the success of falling to sleep faster.   Use relaxation techniques. This can be using breathing and muscle tension release routines. It can also include visualizing peaceful scenes. You can  also help control troubling or intruding thoughts by keeping your mind occupied with boring or repetitive thoughts like the old concept of counting sheep. You can make it more creative like imagining planting one beautiful flower after another in your backyard garden.   During your day, work to eliminate stress. When this is not possible use some of the previous suggestions to help reduce the anxiety that accompanies stressful situations.  MAKE SURE YOU:   Understand these instructions.   Will watch your condition.   Will get help right away if you are not doing well or get worse.  Document Released: 12/01/2000 Document Revised: 11/23/2011 Document Reviewed: 01/01/2008 [facility] Patient Information 2012 [place], [place].

## 2012-07-09 ENCOUNTER — Other Ambulatory Visit: Payer: Self-pay

## 2012-07-09 DIAGNOSIS — E162 Hypoglycemia, unspecified: Secondary | ICD-10-CM

## 2012-07-09 MED ORDER — GLIPIZIDE 10 MG PO TABS: 10.0000 mg | ORAL_TABLET | Freq: Every morning | ORAL | Status: DC

## 2012-08-21 ENCOUNTER — Ambulatory Visit (INDEPENDENT_AMBULATORY_CARE_PROVIDER_SITE_OTHER): Payer: Medicare Other | Admitting: Family Medicine

## 2012-08-21 VITALS — BP 135/72 | HR 70 | Temp 97.9°F | Resp 16 | Ht 73.5 in | Wt 220.0 lb

## 2012-08-21 DIAGNOSIS — S61219A Laceration without foreign body of unspecified finger without damage to nail, initial encounter: Secondary | ICD-10-CM | POA: Insufficient documentation

## 2012-08-21 DIAGNOSIS — M25549 Pain in joints of unspecified hand: Secondary | ICD-10-CM

## 2012-08-21 DIAGNOSIS — S61209A Unspecified open wound of unspecified finger without damage to nail, initial encounter: Secondary | ICD-10-CM

## 2012-08-21 DIAGNOSIS — E119 Type 2 diabetes mellitus without complications: Secondary | ICD-10-CM

## 2012-08-21 DIAGNOSIS — I10 Essential (primary) hypertension: Secondary | ICD-10-CM

## 2012-08-21 MED ORDER — CEPHALEXIN 500 MG PO CAPS: 500.0000 mg | ORAL_CAPSULE | Freq: Two times a day (BID) | ORAL | Status: AC

## 2012-08-21 MED ORDER — OLMESARTAN MEDOXOMIL-HCTZ 40-12.5 MG PO TABS: 1.0000 | ORAL_TABLET | Freq: Every day | ORAL | Status: DC

## 2012-08-21 NOTE — Progress Notes (Signed)
  Subjective:    Patient ID: Michael Vandrunen Sr., male    DOB: 11-10-1946, 66 y.o.   MRN: [id number]  HPI 66 year old male who was working in with a utility knife went to the top of his right index finger. Patient had a lot of bleeding as well as pain. Patient tried to clean it out on his own but didn't that he may need stitches so he came in. Patient denies any fevers or chills. Patient states that he had this done today at 2 pm. Patient denies any numbness any loss of sensation or any stiffness in the finger. Patient has not injured this finger previously.   Review of Systems As stated above in history of present illness    Objective:   Physical Exam Filed Vitals:   08/21/12 1748  BP: 135/72  Pulse: 70  Temp: 97.9 F (36.6 C)  Resp: 16    General: No apparent distress alert and oriented x3 Cardiovascular: Regular in rhythm 1/6 systolic ejection murmur Right index finger does have some dry blood but did have a peroxide placed on it and cleaned thoroughly. Patient has a very small linear incision over the most distal aspect of the index finger. This does not go down to his nail. No signs of erythema no signs of foreign body was cleaned out. Patient then was dressed with Dermabond and Band-Aid placed over it.      Assessment & Plan:   1. Laceration of finger   2. Finger laceration   3. Hypertension   4. Type II or unspecified type diabetes mellitus without mention of complication, not stated as uncontrolled      Finger laceration. Patient was prepped with Dermabond at this time. Due to patient's other medical problems such as diabetes and hypertension patient was given a prescription for Keflex to have on hand in case he has any erythema. Patient declined work note or any pain medications. Patient will followup if he gets any fever, chills, nausea vomiting or any significant swelling of the finger or increasing pain.

## 2012-08-21 NOTE — Patient Instructions (Signed)
Given verbal instructions

## 2012-08-21 NOTE — Assessment & Plan Note (Signed)
Finger laceration. Patient was prepped with Dermabond at this time. Due to patient's other medical problems such as diabetes and hypertension patient was given a prescription for Keflex to have on hand in case he has any erythema. Patient declined work note or any pain medications. Patient will followup if he gets any fever, chills, nausea vomiting or any significant swelling of the finger or increasing pain.

## 2012-08-21 NOTE — Assessment & Plan Note (Signed)
Patient has needed a refill of his medication. Patient was recently seen and has had labs    Chemistry      Component Value Date/Time   NA 137 05/06/2012 1112   K 4.4 05/06/2012 1112   CL 102 05/06/2012 1112   CO2 27 05/06/2012 1112   BUN 14 05/06/2012 1112   CREATININE 0.90 05/06/2012 1112   CREATININE 0.89 11/06/2011 1422      Component Value Date/Time   CALCIUM 9.7 05/06/2012 1112   ALKPHOS 46 05/06/2012 1112   AST 16 05/06/2012 1112   ALT 18 05/06/2012 1112   BILITOT 0.4 05/06/2012 1112    Refilled patient's Benicar today.

## 2012-08-28 ENCOUNTER — Encounter: Payer: Self-pay | Admitting: Internal Medicine

## 2012-09-16 ENCOUNTER — Telehealth: Payer: Self-pay

## 2012-09-16 NOTE — Telephone Encounter (Signed)
OK to dx and follow up in 1-2 months to check labwork and decide what medication to take the place of Lipitor if needed

## 2012-09-16 NOTE — Telephone Encounter (Signed)
Patient is experiencing muscle aches and was told by pharmacy it may be the generic lipitor and suggested he stop taking it for a few days if that is ok with his provider to see if that is what is causing the aches. He would like approval and/or suggestions.  Best [phone]

## 2012-09-17 NOTE — Telephone Encounter (Signed)
I spoke to him and he is advised.

## 2012-09-18 NOTE — Progress Notes (Signed)
History and physical exam reviewed; agree with below assessment and plan.  KMS

## 2012-09-20 NOTE — Progress Notes (Signed)
Reviewed and agree.

## 2012-10-11 ENCOUNTER — Other Ambulatory Visit: Payer: Self-pay | Admitting: Physician Assistant

## 2012-10-21 ENCOUNTER — Ambulatory Visit: Payer: BC Managed Care – PPO | Admitting: Internal Medicine

## 2012-11-04 ENCOUNTER — Encounter: Payer: Self-pay | Admitting: Internal Medicine

## 2012-11-04 ENCOUNTER — Ambulatory Visit (INDEPENDENT_AMBULATORY_CARE_PROVIDER_SITE_OTHER): Payer: Medicare Other | Admitting: Internal Medicine

## 2012-11-04 VITALS — BP 171/106 | HR 75 | Temp 97.9°F | Resp 16 | Ht 73.5 in | Wt 232.0 lb

## 2012-11-04 DIAGNOSIS — E785 Hyperlipidemia, unspecified: Secondary | ICD-10-CM

## 2012-11-04 DIAGNOSIS — E119 Type 2 diabetes mellitus without complications: Secondary | ICD-10-CM

## 2012-11-04 DIAGNOSIS — Z79899 Other long term (current) drug therapy: Secondary | ICD-10-CM

## 2012-11-04 DIAGNOSIS — I10 Essential (primary) hypertension: Secondary | ICD-10-CM

## 2012-11-04 DIAGNOSIS — E7800 Pure hypercholesterolemia, unspecified: Secondary | ICD-10-CM

## 2012-11-04 DIAGNOSIS — Z23 Encounter for immunization: Secondary | ICD-10-CM

## 2012-11-04 LAB — GLUCOSE, POCT (MANUAL RESULT ENTRY): POC Glucose: 139 mg/dl — AB (ref 70–99)

## 2012-11-04 LAB — COMPREHENSIVE METABOLIC PANEL
ALT: 16 U/L (ref 0–53)
CO2: 26 mEq/L (ref 19–32)
Calcium: 9.5 mg/dL (ref 8.4–10.5)
Chloride: 102 mEq/L (ref 96–112)
Creat: 0.86 mg/dL (ref 0.50–1.35)
Glucose, Bld: 139 mg/dL — ABNORMAL HIGH (ref 70–99)
Total Protein: 6.5 g/dL (ref 6.0–8.3)

## 2012-11-04 LAB — POCT GLYCOSYLATED HEMOGLOBIN (HGB A1C): Hemoglobin A1C: 7.5

## 2012-11-04 MED ORDER — AMLODIPINE BESYLATE 10 MG PO TABS: 10.0000 mg | ORAL_TABLET | Freq: Every day | ORAL | Status: DC

## 2012-11-04 NOTE — Progress Notes (Signed)
  Subjective:    Patient ID: Michael Hoctor Sr., male    DOB: 1946/04/25, 66 y.o.   MRN: [id number]  HPI T2D now controlled, less hypoglycemia sxs. HTN not controlled/will add new med today. Statin myopathy with 20mg  lipitor, agrees to cut in half trial. Flu vac Not taking asa as ordered  Review of Systems     Objective:   Physical Exam  Vitals reviewed. Constitutional: He is oriented to person, place, and time. He appears well-developed and well-nourished.  Eyes: No scleral icterus.  Cardiovascular: Normal rate, regular rhythm and normal heart sounds.   Pulmonary/Chest: Effort normal and breath sounds normal.  Neurological: He is alert and oriented to person, place, and time. Coordination normal.  Psychiatric: He has a normal mood and affect.  sensation intact BP 168/94  Results for orders placed in visit on 11/04/12  GLUCOSE, POCT (MANUAL RESULT ENTRY)      Component Value Range   POC Glucose 139 (*) 70 - 99 mg/dl  POCT GLYCOSYLATED HEMOGLOBIN (HGB A1C)      Component Value Range   Hemoglobin A1C 7.5          Assessment & Plan:  Add amlodipine 10mg  lipitor 10mg  to controll muscle aches Reck 2-3 weeks Take asa 81mg  qd

## 2012-11-04 NOTE — Patient Instructions (Addendum)

## 2012-11-25 ENCOUNTER — Ambulatory Visit (INDEPENDENT_AMBULATORY_CARE_PROVIDER_SITE_OTHER): Payer: Medicare Other | Admitting: Internal Medicine

## 2012-11-25 ENCOUNTER — Encounter: Payer: Self-pay | Admitting: Internal Medicine

## 2012-11-25 VITALS — BP 136/90 | HR 76 | Temp 97.7°F | Resp 16 | Ht 73.0 in | Wt 227.0 lb

## 2012-11-25 DIAGNOSIS — I10 Essential (primary) hypertension: Secondary | ICD-10-CM

## 2012-11-25 DIAGNOSIS — T887XXA Unspecified adverse effect of drug or medicament, initial encounter: Secondary | ICD-10-CM

## 2012-11-25 DIAGNOSIS — Z79899 Other long term (current) drug therapy: Secondary | ICD-10-CM

## 2012-11-25 DIAGNOSIS — E119 Type 2 diabetes mellitus without complications: Secondary | ICD-10-CM

## 2012-11-25 DIAGNOSIS — R609 Edema, unspecified: Secondary | ICD-10-CM

## 2012-11-25 LAB — POCT GLYCOSYLATED HEMOGLOBIN (HGB A1C): Hemoglobin A1C: 7.7

## 2012-11-25 LAB — BASIC METABOLIC PANEL
CO2: 27 mEq/L (ref 19–32)
Calcium: 9.2 mg/dL (ref 8.4–10.5)
Creat: 0.89 mg/dL (ref 0.50–1.35)
Glucose, Bld: 117 mg/dL — ABNORMAL HIGH (ref 70–99)

## 2012-11-25 MED ORDER — METOPROLOL TARTRATE 25 MG PO TABS: 25.0000 mg | ORAL_TABLET | Freq: Two times a day (BID) | ORAL | Status: DC

## 2012-11-25 NOTE — Patient Instructions (Signed)
DASH Diet The DASH diet stands for "Dietary Approaches to Stop Hypertension." It is a healthy eating plan that has been shown to reduce high blood pressure (hypertension) in as little as 14 days, while also possibly providing other significant health benefits. These other health benefits include reducing the risk of breast cancer after menopause and reducing the risk of type 2 diabetes, heart disease, colon cancer, and stroke. Health benefits also include weight loss and slowing kidney failure in patients with chronic kidney disease.  DIET GUIDELINES  Limit salt (sodium). Your diet should contain less than 1500 mg of sodium daily.  Limit refined or processed carbohydrates. Your diet should include mostly whole grains. Desserts and added sugars should be used sparingly.  Include small amounts of heart-healthy fats. These types of fats include nuts, oils, and tub margarine. Limit saturated and trans fats. These fats have been shown to be harmful in the body. CHOOSING FOODS  The following food groups are based on a 2000 calorie diet. See your Registered Dietitian for individual calorie needs. Grains and Grain Products (6 to 8 servings daily)  Eat More Often: Whole-wheat bread, brown rice, whole-grain or wheat pasta, quinoa, popcorn without added fat or salt (air popped).  Eat Less Often: White bread, white pasta, white rice, cornbread. Vegetables (4 to 5 servings daily)  Eat More Often: Fresh, frozen, and canned vegetables. Vegetables may be raw, steamed, roasted, or grilled with a minimal amount of fat.  Eat Less Often/Avoid: Creamed or fried vegetables. Vegetables in a cheese sauce. Fruit (4 to 5 servings daily)  Eat More Often: All fresh, canned (in natural juice), or frozen fruits. Dried fruits without added sugar. One hundred percent fruit juice ( cup [237 mL] daily).  Eat Less Often: Dried fruits with added sugar. Canned fruit in light or heavy syrup. [employer], Fish, and Poultry (2  servings or less daily. One serving is 3 to 4 oz [85-114 g]).  Eat More Often: Ninety percent or leaner ground beef, tenderloin, sirloin. Round cuts of beef, chicken breast, [place] breast. All fish. Grill, bake, or broil your meat. Nothing should be fried.  Eat Less Often/Avoid: Fatty cuts of meat, [place], or chicken leg, thigh, or wing. Fried cuts of meat or fish. Dairy (2 to 3 servings)  Eat More Often: Low-fat or fat-free milk, low-fat plain or light yogurt, reduced-fat or part-skim cheese.  Eat Less Often/Avoid: Milk (whole, 2%).Whole milk yogurt. Full-fat cheeses. Nuts, Seeds, and Legumes (4 to 5 servings per week)  Eat More Often: All without added salt.  Eat Less Often/Avoid: Salted nuts and seeds, canned beans with added salt. Fats and Sweets (limited)  Eat More Often: Vegetable oils, tub margarines without trans fats, sugar-free gelatin. Mayonnaise and salad dressings.  Eat Less Often/Avoid: Coconut oils, palm oils, butter, stick margarine, cream, half and half, cookies, candy, pie. FOR MORE INFORMATION The Dash Diet Eating Plan: www.dashdiet.org Document Released: 11/23/2011 Document Revised: 02/26/2012 Document Reviewed: 11/23/2011 [facility] Patient Information 2013 [place], [place]. Metoprolol tablets What is this medicine? METOPROLOL (me TOE proe lole) is a beta-blocker. Beta-blockers reduce the workload on the heart and help it to beat more regularly. This medicine is used to treat high blood pressure and to prevent chest pain. It is also used to after a heart attack and to prevent an additional heart attack from occurring. This medicine may be used for other purposes; ask your health care provider or pharmacist if you have questions. What should I tell my health care provider before  I take this medicine? They need to know if you have any of these conditions: -diabetes -heart or vessel disease like slow heart rate, worsening heart failure, heart block, sick sinus syndrome  or Raynaud's disease -kidney disease -liver disease -lung or breathing disease, like asthma or emphysema -pheochromocytoma -thyroid disease -an unusual or allergic reaction to metoprolol, other beta-blockers, medicines, foods, dyes, or preservatives -pregnant or trying to get pregnant -breast-feeding How should I use this medicine? Take this medicine by mouth with a drink of water. Follow the directions on the prescription label. Take this medicine immediately after meals. Take your doses at regular intervals. Do not take more medicine than directed. Do not stop taking this medicine suddenly. This could lead to serious heart-related effects. Talk to your pediatrician regarding the use of this medicine in children. Special care may be needed. Overdosage: If you think you have taken too much of this medicine contact a poison control center or emergency room at once. NOTE: This medicine is only for you. Do not share this medicine with others. What if I miss a dose? If you miss a dose, take it as soon as you can. If it is almost time for your next dose, take only that dose. Do not take double or extra doses. What may interact with this medicine? Do not take this medicine with any of the following medications: -sotalol This medicine may also interact with the following medications: -clonidine -digoxin -dobutamine -epinephrine -isoproterenol -medicine to control heart rhythm like quinidine, propafenone -medicine for depression like monoamine oxidase (MAO) inhibitors, fluoxetine, and paroxetine -medicine for high blood pressure like calcium channel blockers -reserpine This list may not describe all possible interactions. Give your health care provider a list of all the medicines, herbs, non-prescription drugs, or dietary supplements you use. Also tell them if you smoke, drink alcohol, or use illegal drugs. Some items may interact with your medicine. What should I watch for while using this  medicine? Visit your doctor or health care professional for regular check ups. Contact your doctor right away if your symptoms worsen. Check your blood pressure and pulse rate regularly. Ask your health care professional what your blood pressure and pulse rate should be, and when you should contact them. You may get drowsy or dizzy. Do not drive, use machinery, or do anything that needs mental alertness until you know how this medicine affects you. Do not sit or stand up quickly, especially if you are an older patient. This reduces the risk of dizzy or fainting spells. Contact your doctor if these symptoms continue. Alcohol may interfere with the effect of this medicine. Avoid alcoholic drinks. What side effects may I notice from receiving this medicine? Side effects that you should report to your doctor or health care professional as soon as possible: -allergic reactions like skin rash, itching or hives -cold or numb hands or feet -depression -difficulty breathing -faint -fever with sore throat -irregular heartbeat, chest pain -rapid weight gain -swollen legs or ankles Side effects that usually do not require medical attention (report to your doctor or health care professional if they continue or are bothersome): -anxiety or nervousness -change in sex drive or performance -dry skin -headache -nightmares or trouble sleeping -short term memory loss -stomach upset or diarrhea -unusually tired This list may not describe all possible side effects. Call your doctor for medical advice about side effects. You may report side effects to FDA at 1-800-FDA-1088. Where should I keep my medicine? Keep out of the reach of  children. Store at room temperature between 15 and 30 degrees C (59 and 86 degrees F). Throw away any unused medicine after the expiration date. NOTE: This sheet is a summary. It may not cover all possible information. If you have questions about this medicine, talk to your doctor,  pharmacist, or health care provider.  2012, Elsevier/Gold Standard. (02/14/2008 4:11:19 PM)

## 2012-11-25 NOTE — Progress Notes (Signed)
  Subjective:    Patient ID: Michael Sox Sr., male    DOB: [birth year], 66 y.o.   MRN: [id number]  HPI HTN improved NIDDM still improved Tolerating new amlodipine except marked swellin lower legs!  DC amlodipine Review of Systems     Objective:   Physical Exam  Vitals reviewed. Constitutional: He is oriented to person, place, and time. He appears well-developed and well-nourished.  Cardiovascular: Normal rate, regular rhythm and normal heart sounds.  Exam reveals no gallop.   No murmur heard. Pulmonary/Chest: Effort normal and breath sounds normal.  Musculoskeletal: He exhibits edema.  Neurological: He is alert and oriented to person, place, and time. Coordination normal.  Skin:          Pitting edema caused by amlodipine     Results for orders placed in visit on 11/25/12  GLUCOSE, POCT (MANUAL RESULT ENTRY)      Component Value Range   POC Glucose 124 (*) 70 - 99 mg/dl  POCT GLYCOSYLATED HEMOGLOBIN (HGB A1C)      Component Value Range   Hemoglobin A1C 7.7          Assessment & Plan:  DC amlodipine Start Metoprolol 25mg  bid F/up 2-4weeks

## 2012-11-26 ENCOUNTER — Encounter: Payer: Self-pay | Admitting: *Deleted

## 2012-11-30 ENCOUNTER — Telehealth: Payer: Self-pay

## 2012-11-30 MED ORDER — OLMESARTAN MEDOXOMIL-HCTZ 40-12.5 MG PO TABS: 1.0000 | ORAL_TABLET | Freq: Every day | ORAL | Status: DC

## 2012-11-30 NOTE — Telephone Encounter (Signed)
Patient is requesting a refill of Benicar.  [id number] - [place], [place] - [address]

## 2012-11-30 NOTE — Telephone Encounter (Signed)
The pharmacy should make their request electronically for faster service.  Rx sent.

## 2012-12-01 NOTE — Telephone Encounter (Signed)
Left message for patient rx sent in. Any questions give [place] a call.

## 2012-12-04 ENCOUNTER — Telehealth: Payer: Self-pay

## 2012-12-04 NOTE — Telephone Encounter (Signed)
Called patient and he has been taking metoprolol for a week and states in the mornings his BP readings: diastolic 170's systolic 100's, the afternoons diastolic 150's systolic high 80's low 90's, and the evenings diastolic 130's systolic 80's.

## 2012-12-04 NOTE — Telephone Encounter (Signed)
Patient notified and voiced understanding.

## 2012-12-04 NOTE — Telephone Encounter (Signed)
Pt states he was given BP meds and BP has not dropped at all. Would like to know how quickly meds can work. He would like to know flu symptoms can affect BP readings. #[phone]

## 2012-12-04 NOTE — Telephone Encounter (Signed)
1) It will take some time for your blood pressure to decrease. Your current dose of metoprolol is not likely to be your final dose, we will possibly have to titrate you up to bring your pressure down gradually. Per Dr. Ernestene Mention note RTC 2-4 weeks from 11/25/12, so please keep that follow up while maintaining your medication dose.  2) Having influenza like symptoms can certainly elevate your blood pressure. Blood pressure is very dynamic and is constantly changing.

## 2012-12-30 ENCOUNTER — Ambulatory Visit: Payer: Medicare Other

## 2012-12-30 ENCOUNTER — Encounter: Payer: Self-pay | Admitting: Internal Medicine

## 2012-12-30 ENCOUNTER — Ambulatory Visit (INDEPENDENT_AMBULATORY_CARE_PROVIDER_SITE_OTHER): Payer: Medicare Other | Admitting: Internal Medicine

## 2012-12-30 VITALS — BP 138/82 | HR 60 | Temp 98.0°F | Resp 16 | Ht 75.5 in | Wt 232.8 lb

## 2012-12-30 DIAGNOSIS — R609 Edema, unspecified: Secondary | ICD-10-CM

## 2012-12-30 DIAGNOSIS — R600 Localized edema: Secondary | ICD-10-CM

## 2012-12-30 DIAGNOSIS — E119 Type 2 diabetes mellitus without complications: Secondary | ICD-10-CM

## 2012-12-30 DIAGNOSIS — I10 Essential (primary) hypertension: Secondary | ICD-10-CM

## 2012-12-30 DIAGNOSIS — Z79899 Other long term (current) drug therapy: Secondary | ICD-10-CM

## 2012-12-30 LAB — CBC WITH DIFFERENTIAL/PLATELET
Basophils Absolute: 0.1 10*3/uL (ref 0.0–0.1)
Basophils Relative: 1 % (ref 0–1)
MCHC: 34.1 g/dL (ref 30.0–36.0)
Neutro Abs: 4.2 10*3/uL (ref 1.7–7.7)
Neutrophils Relative %: 55 % (ref 43–77)
RDW: 14.9 % (ref 11.5–15.5)

## 2012-12-30 LAB — POCT URINALYSIS DIPSTICK
Protein, UA: 300
Spec Grav, UA: 1.025
Urobilinogen, UA: 0.2

## 2012-12-30 LAB — COMPREHENSIVE METABOLIC PANEL
AST: 19 U/L (ref 0–37)
Albumin: 3.2 g/dL — ABNORMAL LOW (ref 3.5–5.2)
Alkaline Phosphatase: 49 U/L (ref 39–117)
Potassium: 4.1 mEq/L (ref 3.5–5.3)
Sodium: 139 mEq/L (ref 135–145)
Total Protein: 6.1 g/dL (ref 6.0–8.3)

## 2012-12-30 LAB — POCT UA - MICROSCOPIC ONLY
Casts, Ur, LPF, POC: NEGATIVE
Crystals, Ur, HPF, POC: NEGATIVE

## 2012-12-30 MED ORDER — CHLORTHALIDONE 25 MG PO TABS: 25.0000 mg | ORAL_TABLET | Freq: Every day | ORAL | Status: DC

## 2012-12-30 NOTE — Progress Notes (Signed)
  Subjective:    Patient ID: Michael Thor Sr., male    DOB: 1946-03-31, 67 y.o.   MRN: [id number]  HPI Pedal edema persists. Amlodipine dced and metoprolol started for htn. HTN is controlled, NIDDM improving. Is able to exercise and no sob. Last bmp 1 month ago normal   Review of Systems New ankle edema   Objective:   Physical Exam  Vitals reviewed. Constitutional: He is oriented to person, place, and time. He appears well-developed and well-nourished.  Neck: Neck supple.  Cardiovascular: Normal rate, regular rhythm, normal heart sounds and intact distal pulses.  Exam reveals no gallop.   No murmur heard. Pulmonary/Chest: Effort normal and breath sounds normal.  Musculoskeletal: He exhibits edema.  Neurological: He is alert and oriented to person, place, and time. He exhibits normal muscle tone. Coordination normal.  Skin: No bruising and no rash noted.          3+ pedal edema left worse than right No tenderness, no trauma  Psychiatric: He has a normal mood and affect.   Cmet/ua/microalbumin   [facility] reading (PRIMARY) by  Dr.Lashanti Chambless. Normal cxr     Assessment & Plan:  Persistant pedal edema/started with amlodipine/Has dced amlodipine Cardiology consult Support socks for varicose veins Start chlorthalidone 25mg  qd/RTC 1 week do bmet

## 2012-12-30 NOTE — Patient Instructions (Signed)
Edema Edema is an abnormal build-up of fluids in tissues. Because this is partly dependent on gravity (water flows to the lowest place), it is more common in the legs and thighs (lower extremities). It is also common in the looser tissues, like around the eyes. Painless swelling of the feet and ankles is common and increases as a person ages. It may affect both legs and may include the calves or even thighs. When squeezed, the fluid may move out of the affected area and may leave a dent for a few moments. CAUSES   Prolonged standing or sitting in one place for extended periods of time. Movement helps pump tissue fluid into the veins, and absence of movement prevents this, resulting in edema.  Varicose veins. The valves in the veins do not work as well as they should. This causes fluid to leak into the tissues.  Fluid and salt overload.  Injury, burn, or surgery to the leg, ankle, or foot, may damage veins and allow fluid to leak out.  Sunburn damages vessels. Leaky vessels allow fluid to go out into the sunburned tissues.  Allergies (from insect bites or stings, medications or chemicals) cause swelling by allowing vessels to become leaky.  Protein in the blood helps keep fluid in your vessels. Low protein, as in malnutrition, allows fluid to leak out.  Hormonal changes, including pregnancy and menstruation, cause fluid retention. This fluid may leak out of vessels and cause edema.  Medications that cause fluid retention. Examples are sex hormones, blood pressure medications, steroid treatment, or anti-depressants.  Some illnesses cause edema, especially heart failure, kidney disease, or liver disease.  Surgery that cuts veins or lymph nodes, such as surgery done for the heart or for breast cancer, may result in edema. DIAGNOSIS  Your caregiver is usually easily able to determine what is causing your swelling (edema) by simply asking what is wrong (getting a history) and examining you (doing  a physical). Sometimes x-rays, EKG (electrocardiogram or heart tracing), and blood work may be done to evaluate for underlying medical illness. TREATMENT  General treatment includes:  Leg elevation (or elevation of the affected body part).  Restriction of fluid intake.  Prevention of fluid overload.  Compression of the affected body part. Compression with elastic bandages or support stockings squeezes the tissues, preventing fluid from entering and forcing it back into the blood vessels.  Diuretics (also called water pills or fluid pills) pull fluid out of your body in the form of increased urination. These are effective in reducing the swelling, but can have side effects and must be used only under your caregiver's supervision. Diuretics are appropriate only for some types of edema. The specific treatment can be directed at any underlying causes discovered. Heart, liver, or kidney disease should be treated appropriately. HOME CARE INSTRUCTIONS   Elevate the legs (or affected body part) above the level of the heart, while lying down.  Avoid sitting or standing still for prolonged periods of time.  Avoid putting anything directly under the knees when lying down, and do not wear constricting clothing or garters on the upper legs.  Exercising the legs causes the fluid to work back into the veins and lymphatic channels. This may help the swelling go down.  The pressure applied by elastic bandages or support stockings can help reduce ankle swelling.  A low-salt diet may help reduce fluid retention and decrease the ankle swelling.  Take any medications exactly as prescribed. SEEK MEDICAL CARE IF:  Your edema is   not responding to recommended treatments. SEEK IMMEDIATE MEDICAL CARE IF:   You develop shortness of breath or chest pain.  You cannot breathe when you lay down; or if, while lying down, you have to get up and go to the window to get your breath.  You are having increasing  swelling without relief from treatment.  You develop a fever over 102 F (38.9 C).  You develop pain or redness in the areas that are swollen.  Tell your caregiver right away if you have gained 3 lb/1.4 kg in 1 day or 5 lb/2.3 kg in a week. MAKE SURE YOU:   Understand these instructions.  Will watch your condition.  Will get help right away if you are not doing well or get worse. Document Released: 12/04/2005 Document Revised: 06/04/2012 Document Reviewed: 07/22/2008 [employer] Patient Information 2013 [employer].  

## 2012-12-31 LAB — MICROALBUMIN, URINE: Microalb, Ur: 313.8 mg/dL — ABNORMAL HIGH (ref 0.00–1.89)

## 2013-01-02 ENCOUNTER — Other Ambulatory Visit: Payer: Self-pay | Admitting: Physician Assistant

## 2013-01-07 ENCOUNTER — Ambulatory Visit (INDEPENDENT_AMBULATORY_CARE_PROVIDER_SITE_OTHER): Payer: Medicare Other | Admitting: Internal Medicine

## 2013-01-07 VITALS — BP 153/94 | HR 64 | Temp 97.9°F | Resp 17 | Ht 75.5 in | Wt 228.0 lb

## 2013-01-07 DIAGNOSIS — E162 Hypoglycemia, unspecified: Secondary | ICD-10-CM

## 2013-01-07 DIAGNOSIS — E119 Type 2 diabetes mellitus without complications: Secondary | ICD-10-CM

## 2013-01-07 DIAGNOSIS — R809 Proteinuria, unspecified: Secondary | ICD-10-CM

## 2013-01-07 DIAGNOSIS — Z79899 Other long term (current) drug therapy: Secondary | ICD-10-CM

## 2013-01-07 DIAGNOSIS — I10 Essential (primary) hypertension: Secondary | ICD-10-CM

## 2013-01-07 DIAGNOSIS — R609 Edema, unspecified: Secondary | ICD-10-CM

## 2013-01-07 LAB — GLUCOSE, POCT (MANUAL RESULT ENTRY): POC Glucose: 83 mg/dl (ref 70–99)

## 2013-01-07 NOTE — Progress Notes (Signed)
  Subjective:    Patient ID: Michael Heslin Sr., male    DOB: 05-07-1946, 67 y.o.   MRN: [id number]  HPI Leg edema improved on hygroton/chlorthalidone. Has greatly increased proteinuria/microalbumin States had low Blood glucoses this week, as low as 52. Will adjust meds Blood pressure remains borderline high Need help and will refer back to nephrology   Review of Systems     Objective:   Physical Exam  Vitals reviewed. Constitutional: He is oriented to person, place, and time. He appears well-developed and well-nourished. No distress.  Eyes: EOM are normal.  Cardiovascular: Normal rate, regular rhythm and normal heart sounds.   Pulmonary/Chest: Effort normal and breath sounds normal.  Musculoskeletal: He exhibits edema.  Neurological: He is alert and oriented to person, place, and time.  Psychiatric: He has a normal mood and affect.   153/90  140/80  160/88 Edema much better still 3+ left greater than right  Results for orders placed in visit on 01/07/13  GLUCOSE, POCT (MANUAL RESULT ENTRY)      Component Value Range   POC Glucose 83  70 - 99 mg/dl         Assessment & Plan:  Edema and proteinuria Low glucose spells/ decrease glipizide Continue chlorthalidone HTN not to goal  Refer to nephrology

## 2013-01-07 NOTE — Patient Instructions (Addendum)
Edema Edema is an abnormal build-up of fluids in tissues. Because this is partly dependent on gravity (water flows to the lowest place), it is more common in the legs and thighs (lower extremities). It is also common in the looser tissues, like around the eyes. Painless swelling of the feet and ankles is common and increases as a person ages. It may affect both legs and may include the calves or even thighs. When squeezed, the fluid may move out of the affected area and may leave a dent for a few moments. CAUSES   Prolonged standing or sitting in one place for extended periods of time. Movement helps pump tissue fluid into the veins, and absence of movement prevents this, resulting in edema.  Varicose veins. The valves in the veins do not work as well as they should. This causes fluid to leak into the tissues.  Fluid and salt overload.  Injury, burn, or surgery to the leg, ankle, or foot, may damage veins and allow fluid to leak out.  Sunburn damages vessels. Leaky vessels allow fluid to go out into the sunburned tissues.  Allergies (from insect bites or stings, medications or chemicals) cause swelling by allowing vessels to become leaky.  Protein in the blood helps keep fluid in your vessels. Low protein, as in malnutrition, allows fluid to leak out.  Hormonal changes, including pregnancy and menstruation, cause fluid retention. This fluid may leak out of vessels and cause edema.  Medications that cause fluid retention. Examples are sex hormones, blood pressure medications, steroid treatment, or anti-depressants.  Some illnesses cause edema, especially heart failure, kidney disease, or liver disease.  Surgery that cuts veins or lymph nodes, such as surgery done for the heart or for breast cancer, may result in edema. DIAGNOSIS  Your caregiver is usually easily able to determine what is causing your swelling (edema) by simply asking what is wrong (getting a history) and examining you (doing  a physical). Sometimes x-rays, EKG (electrocardiogram or heart tracing), and blood work may be done to evaluate for underlying medical illness. TREATMENT  General treatment includes:  Leg elevation (or elevation of the affected body part).  Restriction of fluid intake.  Prevention of fluid overload.  Compression of the affected body part. Compression with elastic bandages or support stockings squeezes the tissues, preventing fluid from entering and forcing it back into the blood vessels.  Diuretics (also called water pills or fluid pills) pull fluid out of your body in the form of increased urination. These are effective in reducing the swelling, but can have side effects and must be used only under your caregiver's supervision. Diuretics are appropriate only for some types of edema. The specific treatment can be directed at any underlying causes discovered. Heart, liver, or kidney disease should be treated appropriately. HOME CARE INSTRUCTIONS   Elevate the legs (or affected body part) above the level of the heart, while lying down.  Avoid sitting or standing still for prolonged periods of time.  Avoid putting anything directly under the knees when lying down, and do not wear constricting clothing or garters on the upper legs.  Exercising the legs causes the fluid to work back into the veins and lymphatic channels. This may help the swelling go down.  The pressure applied by elastic bandages or support stockings can help reduce ankle swelling.  A low-salt diet may help reduce fluid retention and decrease the ankle swelling.  Take any medications exactly as prescribed. SEEK MEDICAL CARE IF:  Your edema is   not responding to recommended treatments. SEEK IMMEDIATE MEDICAL CARE IF:   You develop shortness of breath or chest pain.  You cannot breathe when you lay down; or if, while lying down, you have to get up and go to the window to get your breath.  You are having increasing  swelling without relief from treatment.  You develop a fever over 102 F (38.9 C).  You develop pain or redness in the areas that are swollen.  Tell your caregiver right away if you have gained 3 lb/1.4 kg in 1 day or 5 lb/2.3 kg in a week. MAKE SURE YOU:   Understand these instructions.  Will watch your condition.  Will get help right away if you are not doing well or get worse. Document Released: 12/04/2005 Document Revised: 06/04/2012 Document Reviewed: 07/22/2008 [facility] Patient Information 2013 [place], [place]. Hypoglycemia (Low Blood Sugar) Hypoglycemia is when the glucose (sugar) in your blood is too low. Hypoglycemia can happen for many reasons. It can happen to people with or without diabetes. Hypoglycemia can develop quickly and can be a medical emergency.  CAUSES  Having hypoglycemia does not mean that you will develop diabetes. Different causes include:  Missed or delayed meals or not enough carbohydrates eaten.  Medication overdose. This could be by accident or deliberate. If by accident, your medication may need to be adjusted or changed.  Exercise or increased activity without adjustments in carbohydrates or medications.  A nerve disorder that affects body functions like your heart rate, blood pressure and digestion (autonomic neuropathy).  A condition where the stomach muscles do not function properly (gastroparesis). Therefore, medications may not absorb properly.  The inability to recognize the signs of hypoglycemia (hypoglycemic unawareness).  Absorption of insulin  may be altered.  Alcohol consumption.  Pregnancy/menstrual cycles/postpartum. This may be due to hormones.  Certain kinds of tumors. This is very rare. SYMPTOMS   Sweating.  Hunger.  Dizziness.  Blurred vision.  Drowsiness.  Weakness.  Headache.  Rapid heart beat.  Shakiness.  Nervousness. DIAGNOSIS  Diagnosis is made by monitoring blood glucose in one or all of the  following ways:  Fingerstick blood glucose monitoring.  Laboratory results. TREATMENT  If you think your blood glucose is low:  Check your blood glucose, if possible. If it is less than 70 mg/dl, take one of the following:  3-4 glucose tablets.   cup juice (prefer clear like apple).   cup "regular" soda pop.  1 cup milk.  -1 tube of glucose gel.  5-6 hard candies.  Do not over treat because your blood glucose (sugar) will only go too high.  Wait 15 minutes and recheck your blood glucose. If it is still less than 70 mg/dl (or below your target range), repeat treatment.  Eat a snack if it is more than one hour until your next meal. Sometimes, your blood glucose may go so low that you are unable to treat yourself. You may need someone to help you. You may even pass out or be unable to swallow. This may require you to get an injection of glucagon, which raises the blood glucose. HOME CARE INSTRUCTIONS  Check blood glucose as recommended by your caregiver.  Take medication as prescribed by your caregiver.  Follow your meal plan. Do not skip meals. Eat on time.  If you are going to drink alcohol, drink it only with meals.  Check your blood glucose before driving.  Check your blood glucose before and after exercise. If you exercise longer  or different than usual, be sure to check blood glucose more frequently.  Always carry treatment with you. Glucose tablets are the easiest to carry.  Always wear medical alert jewelry or carry some form of identification that states that you have diabetes. This will alert people that you have diabetes. If you have hypoglycemia, they will have a better idea on what to do. SEEK MEDICAL CARE IF:   You are having problems keeping your blood sugar at target range.  You are having frequent episodes of hypoglycemia.  You feel you might be having side effects from your medicines.  You have symptoms of an illness that is not improving after  3-4 days.  You notice a change in vision or a new problem with your vision. SEEK IMMEDIATE MEDICAL CARE IF:   You are a family member or friend of a person whose blood glucose goes below 70 mg/dl and is accompanied by:  Confusion.  A change in mental status.  The inability to swallow.  Passing out. Document Released: 12/04/2005 Document Revised: 02/26/2012 Document Reviewed: 04/01/2012 [facility] Patient Information 2013 [place], [place].

## 2013-01-14 ENCOUNTER — Other Ambulatory Visit: Payer: Self-pay | Admitting: Physician Assistant

## 2013-01-28 ENCOUNTER — Other Ambulatory Visit: Payer: Self-pay | Admitting: Internal Medicine

## 2013-01-28 ENCOUNTER — Other Ambulatory Visit: Payer: Self-pay | Admitting: Physician Assistant

## 2013-02-11 ENCOUNTER — Other Ambulatory Visit: Payer: Self-pay | Admitting: Oncology

## 2013-02-11 DIAGNOSIS — D472 Monoclonal gammopathy: Secondary | ICD-10-CM

## 2013-02-11 NOTE — Progress Notes (Signed)
Received office notes from Dr. Cristal Deer Guest @ [facility]; forwarded to Dr. Gaylyn Rong.

## 2013-02-12 ENCOUNTER — Telehealth: Payer: Self-pay | Admitting: Oncology

## 2013-02-12 NOTE — Telephone Encounter (Signed)
lvm for pt regarding to 3.6.14 appt

## 2013-02-15 DIAGNOSIS — C9000 Multiple myeloma not having achieved remission: Secondary | ICD-10-CM

## 2013-02-15 HISTORY — DX: Multiple myeloma not having achieved remission: C90.00

## 2013-02-20 ENCOUNTER — Other Ambulatory Visit: Payer: BC Managed Care – PPO | Admitting: Lab

## 2013-02-20 ENCOUNTER — Ambulatory Visit: Payer: BC Managed Care – PPO | Admitting: Oncology

## 2013-02-21 NOTE — Progress Notes (Signed)
No show.  I sent reminder letter.

## 2013-02-26 ENCOUNTER — Telehealth: Payer: Self-pay | Admitting: Oncology

## 2013-02-26 NOTE — Telephone Encounter (Signed)
pt called to r/s missed appt....Done °

## 2013-02-27 ENCOUNTER — Other Ambulatory Visit: Payer: Self-pay | Admitting: Physician Assistant

## 2013-03-03 ENCOUNTER — Other Ambulatory Visit (HOSPITAL_BASED_OUTPATIENT_CLINIC_OR_DEPARTMENT_OTHER): Payer: Medicare Other

## 2013-03-03 ENCOUNTER — Ambulatory Visit (HOSPITAL_BASED_OUTPATIENT_CLINIC_OR_DEPARTMENT_OTHER): Payer: Medicare Other | Admitting: Oncology

## 2013-03-03 ENCOUNTER — Other Ambulatory Visit: Payer: Self-pay | Admitting: Oncology

## 2013-03-03 ENCOUNTER — Telehealth: Payer: Self-pay | Admitting: Oncology

## 2013-03-03 VITALS — BP 166/93 | HR 58 | Temp 97.7°F | Resp 20 | Ht 75.5 in | Wt 233.3 lb

## 2013-03-03 DIAGNOSIS — E119 Type 2 diabetes mellitus without complications: Secondary | ICD-10-CM

## 2013-03-03 DIAGNOSIS — D472 Monoclonal gammopathy: Secondary | ICD-10-CM

## 2013-03-03 DIAGNOSIS — D509 Iron deficiency anemia, unspecified: Secondary | ICD-10-CM

## 2013-03-03 DIAGNOSIS — R809 Proteinuria, unspecified: Secondary | ICD-10-CM

## 2013-03-03 LAB — CBC WITH DIFFERENTIAL/PLATELET
Basophils Absolute: 0 10*3/uL (ref 0.0–0.1)
EOS%: 4.7 % (ref 0.0–7.0)
Eosinophils Absolute: 0.4 10*3/uL (ref 0.0–0.5)
HGB: 11.9 g/dL — ABNORMAL LOW (ref 13.0–17.1)
MONO#: 0.6 10*3/uL (ref 0.1–0.9)
NEUT#: 4.9 10*3/uL (ref 1.5–6.5)
RDW: 14.4 % (ref 11.0–14.6)
WBC: 8.3 10*3/uL (ref 4.0–10.3)
lymph#: 2.3 10*3/uL (ref 0.9–3.3)

## 2013-03-03 LAB — COMPREHENSIVE METABOLIC PANEL (CC13)
AST: 15 U/L (ref 5–34)
Albumin: 2.2 g/dL — ABNORMAL LOW (ref 3.5–5.0)
BUN: 17 mg/dL (ref 7.0–26.0)
CO2: 28 mEq/L (ref 22–29)
Calcium: 9.2 mg/dL (ref 8.4–10.4)
Chloride: 104 mEq/L (ref 98–107)
Glucose: 222 mg/dl — ABNORMAL HIGH (ref 70–99)
Potassium: 4.5 mEq/L (ref 3.5–5.1)

## 2013-03-03 NOTE — Patient Instructions (Addendum)
1.  History of iron deficiency anemia:  Will check iron level again to see if your anemia is due to iron deficiency. 2.  History of monoclonal gammopathy of unknown significance (MGUS):  Your kidney doctor asked me to make sure that you're not having progression of MGUS to active myeloma.   *  Will check your SPEP for M-spike and serum light chain.  *  Will rule out anemia of iron deficiency.  If these are positive, we may consider bone marrow biopsy to rule out active myeloma which requires chemotherapy.  Without treatment, active myeloma can cause anemia, kidney failure, bone fracture, and high calcium.  3.  Follow up:  In about 3 months for lab and 6 months for return visit.

## 2013-03-03 NOTE — Progress Notes (Signed)
[facility] OFFICE PROGRESS NOTE  DIAGNOSIS AND CURRENT THERAPIES:    1.  Monoclonal gammopathy of unknown significant (MGUS); on watchful observation. 2.  Iron deficiency anemia with negative GI work up.  He has not been very adherent to oral iron.   INTERVAL HISTORY: Michael Amborn Sr. 67 y.o. male returns for regular follow up.  He has been lost to follow up.  He was referred back to [place] by Dr. Hyman Hopes.  He still works full time.  He has chronic back pain which worsens with exertional activities such has heavy lifting or having intercourse.  Back pain is mild to moderate; across in lower back; no need for pain meds; no radiating; no lower extremities weakness or paresthesia.    Patient denies fever, anorexia, weight loss, fatigue, headache, visual changes, confusion, drenching night sweats, palpable lymph node swelling, mucositis, odynophagia, dysphagia, nausea vomiting, jaundice, chest pain, palpitation, shortness of breath, dyspnea on exertion, productive cough, gum bleeding, epistaxis, hematemesis, hemoptysis, abdominal pain, abdominal swelling, early satiety, melena, hematochezia, hematuria, skin rash, spontaneous bleeding, joint swelling, joint pain, heat or cold intolerance, bowel bladder incontinence, focal motor weakness, paresthesia, depression.    MEDICAL HISTORY: Past Medical History  Diagnosis Date  . Diabetes mellitus   . Hyperlipidemia   . Hypertension   . Osteoarthritis   . MGUS (monoclonal gammopathy of unknown significance)   . GERD (gastroesophageal reflux disease)   . Anemia     SURGICAL HISTORY: No past surgical history on file.  MEDICATIONS: Current Outpatient Prescriptions  Medication Sig Dispense Refill  . aspirin 81 MG tablet Take 81 mg by mouth daily.        Marland Kitchen BENICAR HCT 40-12.5 MG per tablet TAKE 1 TABLET BY MOUTH DAILY.  90 tablet  0  . fish oil-omega-3 fatty acids 1000 MG capsule Take 1 capsule by mouth daily.        Marland Kitchen GINSENG PO Take by mouth  daily.      . Glucosamine-Chondroitin-MSM (TRIPLE FLEX PO) Take by mouth 2 (two) times daily.      . Iron TABS Take by mouth every other day.      . metoprolol tartrate (LOPRESSOR) 25 MG tablet Take 1 tablet (25 mg total) by mouth 2 (two) times daily.  180 tablet  3  . Multiple Vitamin (MULTIVITAMIN) tablet Take 1 tablet by mouth daily.        Marland Kitchen glipiZIDE (GLUCOTROL XL) 5 MG 24 hr tablet TAKE 1 TABLET BY MOUTH TWICE A DAY BEFORE A MEAL  180 tablet  0  . glipiZIDE (GLUCOTROL) 10 MG tablet TAKE 1 TABLET (10 MG TOTAL) BY MOUTH EVERY MORNING.  90 tablet  0  . metFORMIN (GLUCOPHAGE) 1000 MG tablet TAKE 1 TABLET BY MOUTH TWICE A DAY  180 tablet  0   No current facility-administered medications for this visit.    ALLERGIES:  is allergic to amlodipine and crestor.  REVIEW OF SYSTEMS:  The rest of the 14-point review of system was negative.   Filed Vitals:   03/03/13 1517  BP: 166/93  Pulse: 58  Temp: 97.7 F (36.5 C)  Resp: 20   Wt Readings from Last 3 Encounters:  03/03/13 233 lb 4.8 oz (105.824 kg)  01/07/13 228 lb (103.42 kg)  12/30/12 232 lb 12.8 oz (105.597 kg)   ECOG Performance status: 0  PHYSICAL EXAMINATION:  General:  well-nourished man, in no acute distress.  Eyes:  no scleral icterus.  ENT:  There were no oropharyngeal  lesions.  Neck was without thyromegaly.  Lymphatics:  Negative cervical, supraclavicular or axillary adenopathy.  Respiratory: lungs were clear bilaterally without wheezing or crackles.  Cardiovascular:  Regular rate and rhythm, S1/S2, without murmur, rub or gallop.  There was 1+ bilateral pedal edema to ankles left worse than right.  GI:  abdomen was soft, flat, nontender, nondistended, without organomegaly.  Muscoloskeletal:  no spinal tenderness of palpation of vertebral spine.    Neuro exam was nonfocal.  Patient was able to get on and off exam table without assistance.  Gait was normal.  Patient was alerted and oriented.  Attention was good.   Language was  appropriate.  Mood was normal without depression.  Speech was not pressured.  Thought content was not tangential.    LABORATORY/RADIOLOGY DATA:  Lab Results  Component Value Date   WBC 8.3 03/03/2013   HGB 11.9* 03/03/2013   HCT 35.9* 03/03/2013   PLT 264 03/03/2013   GLUCOSE 222* 03/03/2013   ALT 16 03/03/2013   AST 15 03/03/2013   NA 138 03/03/2013   K 4.5 03/03/2013   CL 104 03/03/2013   CREATININE 1.1 03/03/2013   BUN 17.0 03/03/2013   CO2 28 03/03/2013   TSH 1.135 11/19/2009   HGBA1C 7.7 11/25/2012   MICROALBUR 313.80* 12/30/2012   SPEP, serum free light chain, 24-hour urine for UPEP pending.   ASSESSMENT AND PLAN:   1.  History of iron deficiency anemia:  Will check iron level again to see if your anemia is due to iron deficiency. He had negative GI work up in 2010.  2.  History of monoclonal gammopathy of unknown significance (MGUS):  Given anemia, will need to make sure that MGUS has not progressed to active myeloma.  *  Will check your SPEP for M-spike and serum light chain.  *  Will rule out anemia of iron deficiency.  If these are positive, we may consider bone marrow biopsy to rule out active myeloma which requires chemotherapy.  Without treatment, active myeloma can cause anemia, kidney failure, bone fracture, and high calcium.  3.  HTN:  On benicar/HCTZ; metoprolol.  4.  DM:  He has taken himself off of Glipizide for unknown reason.  I asked him to discuss with his PCP.   5.  Follow up:  In about 3 months for lab and 6 months for return visit but sooner if active myeloma.    The length of time of the face-to-face encounter was 15 minutes. More than 50% of time was spent counseling and coordination of care.

## 2013-03-05 LAB — PROTEIN ELECTROPHORESIS, SERUM
Albumin ELP: 45.2 % — ABNORMAL LOW (ref 55.8–66.1)
Alpha-2-Globulin: 16.6 % — ABNORMAL HIGH (ref 7.1–11.8)
Beta Globulin: 7.3 % — ABNORMAL HIGH (ref 4.7–7.2)
M-Spike, %: 0.42 g/dL
Total Protein, Serum Electrophoresis: 5.8 g/dL — ABNORMAL LOW (ref 6.0–8.3)

## 2013-03-05 LAB — IRON AND TIBC: TIBC: 317 ug/dL (ref 215–435)

## 2013-03-05 LAB — KAPPA/LAMBDA LIGHT CHAINS
Kappa:Lambda Ratio: 1.92 — ABNORMAL HIGH (ref 0.26–1.65)
Lambda Free Lght Chn: 2.64 mg/dL — ABNORMAL HIGH (ref 0.57–2.63)

## 2013-03-06 ENCOUNTER — Other Ambulatory Visit: Payer: Self-pay | Admitting: Oncology

## 2013-03-07 ENCOUNTER — Telehealth: Payer: Self-pay | Admitting: Oncology

## 2013-03-07 NOTE — Telephone Encounter (Signed)
Added BMBX for 3/25 @ 8am (shameeka). BMBX added for 8:15am as pt was already on schedule for lb that day. Lb moved to 8am and BMBX added for 8:15am. S/w pt he is aware.

## 2013-03-11 ENCOUNTER — Other Ambulatory Visit (HOSPITAL_COMMUNITY)
Admission: RE | Admit: 2013-03-11 | Discharge: 2013-03-11 | Disposition: A | Discharge status: Home / Self Care | Payer: Medicare Other | Source: Ambulatory Visit | Attending: Oncology | Admitting: Oncology

## 2013-03-11 ENCOUNTER — Other Ambulatory Visit (HOSPITAL_BASED_OUTPATIENT_CLINIC_OR_DEPARTMENT_OTHER): Payer: BC Managed Care – PPO | Admitting: Lab

## 2013-03-11 ENCOUNTER — Ambulatory Visit (HOSPITAL_BASED_OUTPATIENT_CLINIC_OR_DEPARTMENT_OTHER): Payer: Medicare Other | Admitting: Oncology

## 2013-03-11 ENCOUNTER — Other Ambulatory Visit: Payer: Self-pay | Admitting: Oncology

## 2013-03-11 VITALS — BP 160/102 | HR 75

## 2013-03-11 DIAGNOSIS — D472 Monoclonal gammopathy: Secondary | ICD-10-CM

## 2013-03-11 DIAGNOSIS — R21 Rash and other nonspecific skin eruption: Secondary | ICD-10-CM

## 2013-03-11 DIAGNOSIS — D649 Anemia, unspecified: Secondary | ICD-10-CM | POA: Insufficient documentation

## 2013-03-11 LAB — CBC WITH DIFFERENTIAL/PLATELET
BASO%: 1.1 % (ref 0.0–2.0)
EOS%: 5.4 % (ref 0.0–7.0)
HCT: 36.7 % — ABNORMAL LOW (ref 38.4–49.9)
MCH: 29.1 pg (ref 27.2–33.4)
MCHC: 34 g/dL (ref 32.0–36.0)
NEUT%: 52.4 % (ref 39.0–75.0)
RBC: 4.28 10*6/uL (ref 4.20–5.82)
RDW: 14.3 % (ref 11.0–14.6)
WBC: 7.4 10*3/uL (ref 4.0–10.3)
lymph#: 2.3 10*3/uL (ref 0.9–3.3)

## 2013-03-11 LAB — COMPREHENSIVE METABOLIC PANEL (CC13)
ALT: 17 U/L (ref 0–55)
AST: 16 U/L (ref 5–34)
Calcium: 9.2 mg/dL (ref 8.4–10.4)
Chloride: 103 mEq/L (ref 98–107)
Creatinine: 1 mg/dL (ref 0.7–1.3)
Sodium: 138 mEq/L (ref 136–145)
Total Protein: 6.4 g/dL (ref 6.4–8.3)

## 2013-03-11 LAB — BONE MARROW EXAM: Bone Marrow Exam: 210

## 2013-03-11 LAB — UIFE/LIGHT CHAINS/TP QN, 24-HR UR
Albumin, U: DETECTED
Alpha 1, Urine: DETECTED — AB
Alpha 2, Urine: DETECTED — AB
Beta, Urine: DETECTED — AB
Free Kappa Lt Chains,Ur: 8.98 mg/dL — ABNORMAL HIGH (ref 0.14–2.42)
Free Kappa/Lambda Ratio: 12.65 ratio — ABNORMAL HIGH (ref 2.04–10.37)
Free Lambda Excretion/Day: 18.11 mg/d
Free Lambda Lt Chains,Ur: 0.71 mg/dL — ABNORMAL HIGH (ref 0.02–0.67)
Free Lt Chn Excr Rate: 228.99 mg/d
Gamma Globulin, Urine: DETECTED — AB
Time: 24 h
Total Protein, Urine-Ur/day: 2978 mg/d — ABNORMAL HIGH (ref 10–140)
Total Protein, Urine: 116.8 mg/dL
Volume, Urine: 2550 mL

## 2013-03-11 LAB — CBC
HCT: 36.5 % — ABNORMAL LOW (ref 39.0–52.0)
MCHC: 33.7 g/dL (ref 30.0–36.0)
RDW: 14.1 % (ref 11.5–15.5)

## 2013-03-11 NOTE — Progress Notes (Signed)
Please see bone marrow biopsy procedure note dated same day.  

## 2013-03-11 NOTE — Progress Notes (Signed)
Pt given AVS and verbal discharge instructions for bone marrow biopsy.  Pt instructed to call for any bleeding, pain or any other concerns.  Pt verbalized understanding.  Dressing does have some blood on it.  Pt made aware.  Pt to monitor for continued bleeding.

## 2013-03-11 NOTE — Progress Notes (Signed)
Pt states his b/p is normally high and he has not taken his meds this morning.

## 2013-03-11 NOTE — Procedures (Signed)
   [facility]  Telephone:([phone] Fax:([phone]) [phone]   BONE MARROW BIOPSY AND ASPIRATION   INDICATION:  MGUS with worsening anemia; ruling out progression to active myeloma.   Procedure: After obtained from consent, Jarrod Mcenery Sr. was placed in the prone position. Time out was performed verifying correct patient and procedure. The skin overlying the left posterior crest was prepped with Betadine and draped in the usual sterile fashion. The skin and periosteum were infiltrated with 20 mL of 2% lidocaine. A small puncture wound was made with #11 scalpel blade.  Bone marrow aspirate was obtained on the first pass of the aspiration needle.  Two separate core biopsies were obtained through the same incision as the first one was mainly cortical.   The aspirate was sent for routine histology, flow cytometry, and cytogenetics.  Core biopsy was sent for routine histology.   Jeraldine Loots Sr. tolerated procedure well with minimal  blood loss and without immediate complication.   A sterile dressing was applied.   Jethro Bolus M.D. 03/11/2013

## 2013-03-11 NOTE — Patient Instructions (Addendum)
Bone Marrow Aspiration, Bone Marrow Biopsy  Care After  Read the instructions outlined below and refer to this sheet in the next few weeks. These discharge instructions provide you with general information on caring for yourself after you leave the hospital. Your caregiver may also give you specific instructions. While your treatment has been planned according to the most current medical practices available, unavoidable complications occasionally occur. If you have any problems or questions after discharge, call your caregiver.  FINDING OUT THE RESULTS OF YOUR TEST  Not all test results are available during your visit. If your test results are not back during the visit, make an appointment with your caregiver to find out the results. Do not assume everything is normal if you have not heard from your caregiver or the medical facility. It is important for you to follow up on all of your test results.   HOME CARE INSTRUCTIONS   You have had sedation and may be sleepy or dizzy. Your thinking may not be as clear as usual. For the next 24 hours:   Only take over-the-counter or prescription medicines for pain, discomfort, and or fever as directed by your caregiver.   Do not drink alcohol.   Do not smoke.   Do not drive.   Do not make important legal decisions.   Do not operate heavy machinery.   Do not care for small children by yourself.   Keep your dressing clean and dry. You may replace dressing with a bandage after 24 hours.   You may take a bath or shower after 24 hours.   Use an ice pack for 20 minutes every 2 hours while awake for pain as needed.  SEEK MEDICAL CARE IF:    There is redness, swelling, or increasing pain at the biopsy site.   There is pus coming from the biopsy site.   There is drainage from a biopsy site lasting longer than one day.   An unexplained oral temperature above 102 F (38.9 C) develops.  SEEK IMMEDIATE MEDICAL CARE IF:    You develop a rash.   You have difficulty  breathing.   You develop any reaction or side effects to medications given.  Document Released: 06/23/2005 Document Revised: 02/26/2012 Document Reviewed: 12/01/2008  [employer] Patient Information 2013 [employer].

## 2013-03-12 LAB — ANA: Anti Nuclear Antibody(ANA): NEGATIVE

## 2013-03-12 LAB — ANTI-DNA ANTIBODY, DOUBLE-STRANDED: ds DNA Ab: 2 IU/mL (ref <30)

## 2013-03-13 ENCOUNTER — Other Ambulatory Visit: Payer: Self-pay | Admitting: Oncology

## 2013-03-13 DIAGNOSIS — Z5181 Encounter for therapeutic drug level monitoring: Secondary | ICD-10-CM | POA: Insufficient documentation

## 2013-03-15 ENCOUNTER — Telehealth: Payer: Self-pay | Admitting: Oncology

## 2013-03-15 NOTE — Telephone Encounter (Signed)
Called pt and leftb message regarding appt for 03/31/13 MD only

## 2013-03-18 LAB — CHROMOSOME ANALYSIS, BONE MARROW

## 2013-03-20 ENCOUNTER — Other Ambulatory Visit: Payer: Self-pay | Admitting: Oncology

## 2013-03-20 ENCOUNTER — Encounter: Payer: Self-pay | Admitting: Oncology

## 2013-03-20 DIAGNOSIS — C9000 Multiple myeloma not having achieved remission: Secondary | ICD-10-CM

## 2013-03-30 NOTE — Patient Instructions (Addendum)
1.  Diagnosis:  Multiple myeloma. 2.  Causes:  Unknown.  3.  What is it:  Most of the time, a slow growing cancer of the plasma cell in the bone marrow.  Untreated, it can cause:  Hypercalcemia, renal failure, anemia, and bone fracture.  Having one of these 4 criteria indicates need for treatment.   4.  Stage:  Not classily staged like solid cancer; but rather according to bone marrow cytogenetics and whether there are indication to treat.  5.  Treatment:    *  Oral chemo only:  Revlimid (days 1-21); Dexamethasone (once a week); every 4 week-cycle.  *  Oral + SQ chemo:  Revlimid (days 1-21); Velcade SQ once a week (3 weeks on, 1 week off);  Dexamethasone (once a week); every 4 week-cycle.  Combination chemo with both oral and SQ is better than just oral chemo alone in achieving remission faster and possibly improved survival.   *  In patients who are reasonably in good health, we may consider autologous bone marrow transplant to improve chance of cure.   6.  Potential side effects of these chemo:  Low blood count, bleeding, infection, neuropathy, skin rash, blood clot.   7.  Chance of response:  Upward to 70-80% depending on risk stratification.    8.  How to follow response:  M-spike (on SPEP) and serum light chain should decrease with chemo.

## 2013-03-31 ENCOUNTER — Telehealth: Payer: Self-pay | Admitting: Oncology

## 2013-03-31 ENCOUNTER — Ambulatory Visit (HOSPITAL_BASED_OUTPATIENT_CLINIC_OR_DEPARTMENT_OTHER): Payer: Medicare Other | Admitting: Oncology

## 2013-03-31 VITALS — BP 172/102 | HR 75 | Temp 98.5°F | Resp 20 | Ht 75.5 in | Wt 234.2 lb

## 2013-03-31 DIAGNOSIS — I10 Essential (primary) hypertension: Secondary | ICD-10-CM

## 2013-03-31 DIAGNOSIS — N189 Chronic kidney disease, unspecified: Secondary | ICD-10-CM

## 2013-03-31 DIAGNOSIS — D649 Anemia, unspecified: Secondary | ICD-10-CM

## 2013-03-31 DIAGNOSIS — C9000 Multiple myeloma not having achieved remission: Secondary | ICD-10-CM

## 2013-03-31 MED ORDER — DEXAMETHASONE 4 MG PO TABS: 40.0000 mg | ORAL_TABLET | ORAL | Status: DC

## 2013-03-31 MED ORDER — LENALIDOMIDE 25 MG PO CAPS: 25.0000 mg | ORAL_CAPSULE | Freq: Every day | ORAL | Status: DC

## 2013-03-31 NOTE — Progress Notes (Signed)
Pt enrolled in Revlimid REMs program and New Rx for Revlimid along w/ Pt Assistance Application given to Thelma Barge in [facility] Dept.Marland Kitchen   REMS Auth [id number].

## 2013-03-31 NOTE — Progress Notes (Signed)
[facility] OFFICE PROGRESS NOTE  DIAGNOSIS AND CURRENT THERAPIES:    1.  Monoclonal gammopathy of unknown significant (MGUS) with recent diagnosis of myeloma with worsening anemia; and bone marrow biopsy which showed 13% plasma cell.  2.  Iron deficiency anemia with negative GI work up.  He has not been very adherent to oral iron.   INTERVAL HISTORY: Michael Wymer Sr. 67 y.o. male returns for regular follow up to go over result of work up.  He reports feeling well.  He denies back pain.  He recently got over a cold.  He is working full time as Product/process development scientist.   Patient denies fever, anorexia, weight loss, fatigue, headache, visual changes, confusion, drenching night sweats, palpable lymph node swelling, mucositis, odynophagia, dysphagia, nausea vomiting, jaundice, chest pain, palpitation, shortness of breath, dyspnea on exertion, productive cough, gum bleeding, epistaxis, hematemesis, hemoptysis, abdominal pain, abdominal swelling, early satiety, melena, hematochezia, hematuria, skin rash, spontaneous bleeding, joint swelling, joint pain, heat or cold intolerance, bowel bladder incontinence, back pain, focal motor weakness, paresthesia, depression.     MEDICAL HISTORY: Past Medical History  Diagnosis Date  . Diabetes mellitus   . Hyperlipidemia   . Hypertension   . Osteoarthritis   . MGUS (monoclonal gammopathy of unknown significance)   . GERD (gastroesophageal reflux disease)   . Anemia   . Multiple myeloma 02/2013    normal cytogenetics and FISH panel on 03/11/2013.     SURGICAL HISTORY: No past surgical history on file.  MEDICATIONS: Current Outpatient Prescriptions  Medication Sig Dispense Refill  . aspirin 81 MG tablet Take 81 mg by mouth daily.        Marland Kitchen BENICAR HCT 40-12.5 MG per tablet TAKE 1 TABLET BY MOUTH DAILY.  90 tablet  0  . fish oil-omega-3 fatty acids 1000 MG capsule Take 1 capsule by mouth daily.        Marland Kitchen GINSENG PO Take by mouth daily.      Marland Kitchen  glipiZIDE (GLUCOTROL XL) 5 MG 24 hr tablet Take 10 mg by mouth daily.      . Glucosamine-Chondroitin-MSM (TRIPLE FLEX PO) Take by mouth 2 (two) times daily.      . Iron TABS Take by mouth every other day.      . metFORMIN (GLUCOPHAGE) 1000 MG tablet TAKE 1 TABLET BY MOUTH TWICE A DAY  180 tablet  0  . metoprolol tartrate (LOPRESSOR) 25 MG tablet Take 1 tablet (25 mg total) by mouth 2 (two) times daily.  180 tablet  3  . Multiple Vitamin (MULTIVITAMIN) tablet Take 1 tablet by mouth daily.        Marland Kitchen dexamethasone (DECADRON) 4 MG tablet Take 10 tablets (40 mg total) by mouth once a week.  200 tablet  0  . lenalidomide (REVLIMID) 25 MG capsule Take 1 capsule (25 mg total) by mouth daily.  21 capsule  0   No current facility-administered medications for this visit.    ALLERGIES:  is allergic to amlodipine and crestor.  REVIEW OF SYSTEMS:  The rest of the 14-point review of system was negative.   Filed Vitals:   03/31/13 1347  BP: 172/102  Pulse: 75  Temp: 98.5 F (36.9 C)  Resp: 20   Wt Readings from Last 3 Encounters:  03/31/13 234 lb 3.2 oz (106.232 kg)  03/03/13 233 lb 4.8 oz (105.824 kg)  01/07/13 228 lb (103.42 kg)   ECOG Performance status: 0  PHYSICAL EXAMINATION:  General:  well-nourished man, in  no acute distress.  Eyes:  no scleral icterus.  ENT:  There were no oropharyngeal lesions.  Neck was without thyromegaly.  Lymphatics:  Negative cervical, supraclavicular or axillary adenopathy.  Respiratory: lungs were clear bilaterally without wheezing or crackles.  Cardiovascular:  Regular rate and rhythm, S1/S2, without murmur, rub or gallop.  There was 1+ bilateral pedal edema to ankles left worse than right.  GI:  abdomen was soft, flat, nontender, nondistended, without organomegaly.  Muscoloskeletal:  no spinal tenderness of palpation of vertebral spine.    Neuro exam was nonfocal.  Patient was able to get on and off exam table without assistance.  Gait was normal.  Patient was  alerted and oriented.  Attention was good.   Language was appropriate.  Mood was normal without depression.  Speech was not pressured.  Thought content was not tangential.    LABORATORY/RADIOLOGY DATA:  Lab Results  Component Value Date   WBC 7.2 03/11/2013   HGB 12.3* 03/11/2013   HCT 36.5* 03/11/2013   PLT 271 03/11/2013   GLUCOSE 204* 03/11/2013   ALT 17 03/11/2013   AST 16 03/11/2013   NA 138 03/11/2013   K 4.2 03/11/2013   CL 103 03/11/2013   CREATININE 1.0 03/11/2013   BUN 16.8 03/11/2013   CO2 25 03/11/2013   TSH 1.135 11/19/2009   HGBA1C 7.7 11/25/2012   MICROALBUR 313.80* 12/30/2012     ASSESSMENT AND PLAN:   1.  History of iron deficiency anemia:  Recent iron panel did not show deficiency.  2.  HTN:  On benicar/HCTZ; metoprolol.  4.  DM:  He is on Glipizide and metformin per PCP.  5.  Progression of MGUS to myeloma: - Indication for treatment:  Anemia, slight chronic kidney disease.  - Treatment:    *  Oral chemo only:  Revlimid (days 1-21); Dexamethasone (once a week); every 4 week-cycle.  *  Oral + SQ chemo:  Revlimid (days 1-21); Velcade SQ once a week (3 weeks on, 1 week off);  Dexamethasone (once a week); every 4 week-cycle.  Combination chemo with both oral and SQ is better than just oral chemo alone in achieving remission faster and possibly improved survival.   *  In patients who are reasonably in good health, we may consider autologous bone marrow transplant to improve chance of cure.   He would like to only try Revlimid for now to avoid SQ Velcade.  We will reassess after about 2 months to see if he has good response.   If not, we may need to add on Velcade.   6.  Potential side effects of these chemo:  Low blood count, bleeding, infection, neuropathy, skin rash, blood clot.   7.  Chance of response:  Upward to 70-80% depending on risk stratification.    8.  How to follow response:  M-spike (on SPEP) and serum light chain should decrease with chemo.  5.  Follow up:   In about 2 weeks to go over last minute questions regarding side effects before starting chemo.    The length of time of the face-to-face encounter was 40 minutes. More than 50% of time was spent counseling and coordination of care.      Josalyn Dettmann T. Gaylyn Rong, M.D.

## 2013-03-31 NOTE — Telephone Encounter (Signed)
gv pt appt schedule May.

## 2013-04-01 ENCOUNTER — Encounter: Payer: Self-pay | Admitting: Oncology

## 2013-04-03 ENCOUNTER — Encounter: Payer: Self-pay | Admitting: Oncology

## 2013-04-04 ENCOUNTER — Encounter: Payer: Self-pay | Admitting: *Deleted

## 2013-04-10 ENCOUNTER — Encounter: Payer: Self-pay | Admitting: *Deleted

## 2013-04-10 NOTE — Progress Notes (Signed)
Fax received from [facility] ph #[phone], fax #[phone].  They shipped pt's Revlimid for delivery on 4/25.

## 2013-04-11 ENCOUNTER — Telehealth: Payer: Self-pay | Admitting: *Deleted

## 2013-04-11 ENCOUNTER — Ambulatory Visit (HOSPITAL_COMMUNITY)
Admission: RE | Admit: 2013-04-11 | Discharge: 2013-04-11 | Disposition: A | Discharge status: Home / Self Care | Payer: Medicare Other | Source: Ambulatory Visit | Attending: Oncology | Admitting: Oncology

## 2013-04-11 ENCOUNTER — Encounter: Payer: Self-pay | Admitting: *Deleted

## 2013-04-11 DIAGNOSIS — M5030 Other cervical disc degeneration, unspecified cervical region: Secondary | ICD-10-CM | POA: Insufficient documentation

## 2013-04-11 DIAGNOSIS — M51379 Other intervertebral disc degeneration, lumbosacral region without mention of lumbar back pain or lower extremity pain: Secondary | ICD-10-CM | POA: Insufficient documentation

## 2013-04-11 DIAGNOSIS — C9000 Multiple myeloma not having achieved remission: Secondary | ICD-10-CM | POA: Insufficient documentation

## 2013-04-11 DIAGNOSIS — M76899 Other specified enthesopathies of unspecified lower limb, excluding foot: Secondary | ICD-10-CM | POA: Insufficient documentation

## 2013-04-11 DIAGNOSIS — M439 Deforming dorsopathy, unspecified: Secondary | ICD-10-CM | POA: Insufficient documentation

## 2013-04-11 DIAGNOSIS — M5137 Other intervertebral disc degeneration, lumbosacral region: Secondary | ICD-10-CM | POA: Insufficient documentation

## 2013-04-11 NOTE — Telephone Encounter (Signed)
Pt called to report he is going to received delivery of his revlimid today.  Instructed pt to wait to start Revlimid until he sees dr. Gaylyn Rong again next week on 5/01.   Pt verbalized understanding.

## 2013-04-12 ENCOUNTER — Telehealth: Payer: Self-pay

## 2013-04-12 NOTE — Telephone Encounter (Signed)
PATIENT IS CALLING TO FIND OUT RESULTS FROM [facility]. ALSO STATES THAT KIDNEY DOCTOR HAS CANCELLED OUT SOME OF THE MEDICATION THAT DR Perrin Maltese PRESCRIBED FOR HIM. PLEASE CALL BACK AT [phone]

## 2013-04-14 NOTE — Telephone Encounter (Signed)
Left message for return call.

## 2013-04-14 NOTE — Telephone Encounter (Signed)
Pt wanted to give [place] an update on his visits with Dr. Gaylyn Rong.  Dr. Gaylyn Rong has started him on a chemo pill  (Revlimid), ASA 325mg , and dexamethasone and Dr. Hyman Hopes has taken him off some meds.  He would like for you to review Dr. Gaylyn Rong notes.  He has been taking his Glipizide only 10mg  once a day because glucose has been running average of 79.

## 2013-04-14 NOTE — Progress Notes (Signed)
4/15/4 Fax from Debby @ [facility] to get a PA for his Revlimid.  04/02/13 I called [facility] [phone] and spoke with Raynelle Fanning it was approved and the PA # is [id number]. It is valid from 04/02/13 to 10/02/13.  04/03/13 Faxed RX to Ellis Savage @ [facility] [phone]   04/03/13 [facility] Has the Rx.  04/04/13 Co-pay is 2684.01.  They will work on assistance.  04/07/13 [employer] has given him a 10,000.00 grant.  His ID is [id number]. Fax from [facility]  that it has been shipped.

## 2013-04-15 ENCOUNTER — Ambulatory Visit: Payer: Medicare Other

## 2013-04-15 ENCOUNTER — Ambulatory Visit: Payer: Self-pay

## 2013-04-15 ENCOUNTER — Encounter: Payer: Self-pay | Admitting: Internal Medicine

## 2013-04-15 ENCOUNTER — Ambulatory Visit (INDEPENDENT_AMBULATORY_CARE_PROVIDER_SITE_OTHER): Payer: Medicare Other | Admitting: Internal Medicine

## 2013-04-15 VITALS — BP 160/100 | HR 93 | Temp 98.7°F | Resp 16 | Ht 74.0 in | Wt 226.0 lb

## 2013-04-15 DIAGNOSIS — C9000 Multiple myeloma not having achieved remission: Secondary | ICD-10-CM

## 2013-04-15 DIAGNOSIS — E119 Type 2 diabetes mellitus without complications: Secondary | ICD-10-CM

## 2013-04-15 DIAGNOSIS — IMO0001 Reserved for inherently not codable concepts without codable children: Secondary | ICD-10-CM

## 2013-04-15 DIAGNOSIS — R0989 Other specified symptoms and signs involving the circulatory and respiratory systems: Secondary | ICD-10-CM

## 2013-04-15 DIAGNOSIS — M791 Myalgia, unspecified site: Secondary | ICD-10-CM

## 2013-04-15 DIAGNOSIS — R509 Fever, unspecified: Secondary | ICD-10-CM

## 2013-04-15 DIAGNOSIS — R05 Cough: Secondary | ICD-10-CM

## 2013-04-15 DIAGNOSIS — R059 Cough, unspecified: Secondary | ICD-10-CM

## 2013-04-15 DIAGNOSIS — R079 Chest pain, unspecified: Secondary | ICD-10-CM

## 2013-04-15 LAB — POCT CBC
HCT, POC: 41.3 % — AB (ref 43.5–53.7)
Lymph, poc: 1.3 (ref 0.6–3.4)
MCH, POC: 27.6 pg (ref 27–31.2)
MCHC: 31.5 g/dL — AB (ref 31.8–35.4)
MCV: 87.6 fL (ref 80–97)
MID (cbc): 0.8 (ref 0–0.9)
POC LYMPH PERCENT: 15.5 %L (ref 10–50)
Platelet Count, POC: 300 10*3/uL (ref 142–424)
RDW, POC: 14.5 %
WBC: 8.4 10*3/uL (ref 4.6–10.2)

## 2013-04-15 LAB — POCT GLYCOSYLATED HEMOGLOBIN (HGB A1C): Hemoglobin A1C: 7.9

## 2013-04-15 MED ORDER — AZITHROMYCIN 500 MG PO TABS: 500.0000 mg | ORAL_TABLET | Freq: Every day | ORAL | Status: DC

## 2013-04-15 MED ORDER — HYDROCODONE-ACETAMINOPHEN 7.5-325 MG/15ML PO SOLN: 5.0000 mL | Freq: Four times a day (QID) | ORAL | Status: DC | PRN

## 2013-04-15 MED ORDER — ALBUTEROL SULFATE (2.5 MG/3ML) 0.083% IN NEBU
2.5000 mg | INHALATION_SOLUTION | Freq: Once | RESPIRATORY_TRACT | Status: AC
  Administered 2013-04-15: 2.5 mg via RESPIRATORY_TRACT

## 2013-04-15 MED ORDER — IPRATROPIUM BROMIDE 0.02 % IN SOLN
0.5000 mg | Freq: Once | RESPIRATORY_TRACT | Status: AC
  Administered 2013-04-15: 0.5 mg via RESPIRATORY_TRACT

## 2013-04-15 NOTE — Progress Notes (Signed)
  Subjective:    Patient ID: Michael Siverson Sr., male    DOB: 01-Dec-1946, 67 y.o.   MRN: [id number]  HPI Fever, cough, body aches and recent dx of MM cancer.   Review of Systems     Objective:   Physical Exam  Constitutional: He is oriented to person, place, and time. He appears well-developed and well-nourished. No distress.  HENT:  Mouth/Throat: Oropharynx is clear and moist.  Neck: Neck supple.  Cardiovascular: Normal pulses.  A regularly irregular rhythm present. Frequent extrasystoles are present. Tachycardia present.  Exam reveals gallop and S3.   Pulmonary/Chest: Not tachypneic. No respiratory distress. He has decreased breath sounds. He has rhonchi.  Neurological: He is alert and oriented to person, place, and time. He exhibits normal muscle tone. Coordination normal.  Skin: Skin is warm and dry.  Psychiatric: He has a normal mood and affect.   [facility] reading (PRIMARY) by  Dr.Guest increased markings, no infiltrate.  EKG no change, stable  Results for orders placed in visit on 04/15/13  POCT CBC      Result Value Range   WBC 8.4  4.6 - 10.2 K/uL   Lymph, poc 1.3  0.6 - 3.4   POC LYMPH PERCENT 15.5  10 - 50 %L   MID (cbc) 0.8  0 - 0.9   POC MID % 9.2  0 - 12 %M   POC Granulocyte 6.3  2 - 6.9   Granulocyte percent 75.3  37 - 80 %G   RBC 4.71  4.69 - 6.13 M/uL   Hemoglobin 13.0 (*) 14.1 - 18.1 g/dL   HCT, POC 21.3 (*) 08.6 - 53.7 %   MCV 87.6  80 - 97 fL   MCH, POC 27.6  27 - 31.2 pg   MCHC 31.5 (*) 31.8 - 35.4 g/dL   RDW, POC 57.8     Platelet Count, POC 300  142 - 424 K/uL   MPV 9.8  0 - 99.8 fL  GLUCOSE, POCT (MANUAL RESULT ENTRY)      Result Value Range   POC Glucose 169 (*) 70 - 99 mg/dl  POCT GLYCOSYLATED HEMOGLOBIN (HGB A1C)      Result Value Range   Hemoglobin A1C 7.9          Assessment & Plan:  Asthmatic bronchitis

## 2013-04-15 NOTE — Patient Instructions (Addendum)
Multiple Myeloma Multiple myeloma is the most common cancer of bone. It is caused by the uncontrolled multiplication of a type of white blood cell in the marrow. This white blood cell is called a plasma cell. This means the bone marrow is overworking producing plasma cells. Soon these overproduced cells begin to take up room in the marrow that is needed by other cells. This means that there are soon not enough red or white blood cells or platelets. Not enough red cells mean that the person is anemic. There are not enough red blood cells to carry oxygen around the body. There are not enough white blood cells to fight disease. This causes the person with multiple myeloma to not feel well. There is also bone pain through much of the body. SYMPTOMS  Anemia causes fatigue (tiredness) and weakness.  Back pain is common. This is from fractures (break in bones) caused by damage to the bones of the back.  Lack of white blood cells makes infection more likely.  Bleeding is a common problem from lack of the cells (platelets). Platelets help blood clots form. This may show up as bleeding from any place. Commonly this shows up as bleeding from the nose or gums.  Fractures (bone breaks) are more common anywhere. The back and ribs are the most commonly fractured areas. DIAGNOSIS  This tumor is often suggested by blood tests. Often doing a bone marrow sample makes the diagnosis (learning what is wrong). This is a test performed by taking a small sample of bone with a small needle. This bone often comes from the sternum (breast bone). This sample is sent to a pathologist (a specialist in looking at tissue under a microscope). After looking at the sample under the microscope, the pathologist is able to make a diagnosis of the problem. X-rays may also show boney changes. TREATMENT   Occasionally, anti-cancer medications may be used with multiple myeloma. Your caregiver can discuss this with you.  Medications can  also be given to help with the bone pain.  There is no cure for multiple myeloma. Lifestyle changes can add years of quality living. HOME CARE INSTRUCTIONS  Often there is no specific treatment for multiple myeloma. Most of the treatment consists of adjustments in dietary and living activities. Some of these changes include:  Your dietitian or caregiver helping you with your dietary questions.  Taking iron and vitamins as prescribed by your caregiver.  Eating a well balanced diet.  Staying active, but follow restrictions suggested by your caregiver. Avoiding heavy lifting (more than 10 pounds) and activities that cause increased pain.  Drinking plenty of water.  Using back braces and a cane may help with some of the boney pain. SEEK IMMEDIATE MEDICAL CARE IF:  You develop severe, uncontrolled boney pain.  You or your family notices confusion, problems with decision-making or inability to stay awake.  You notice increased urination or constipation.  You notice problems holding your water or stool.  You have numbness or loss of control of your extremities (arms/hands or legs/feet). Document Released: 08/29/2001 Document Revised: 02/26/2012 Document Reviewed: 11/29/2008 [facility] Patient Information 2013 [place], [place]. Bronchitis Bronchitis is the body's way of reacting to injury and/or infection (inflammation) of the bronchi. Bronchi are the air tubes that extend from the windpipe into the lungs. If the inflammation becomes severe, it may cause shortness of breath. CAUSES  Inflammation may be caused by:  A virus.  Germs (bacteria).  Dust.  Allergens.  Pollutants and many other irritants.  The cells lining the bronchial tree are covered with tiny hairs (cilia). These constantly beat upward, away from the lungs, toward the mouth. This keeps the lungs free of pollutants. When these cells become too irritated and are unable to do their job, mucus begins to develop. This causes  the characteristic cough of bronchitis. The cough clears the lungs when the cilia are unable to do their job. Without either of these protective mechanisms, the mucus would settle in the lungs. Then you would develop pneumonia. Smoking is a common cause of bronchitis and can contribute to pneumonia. Stopping this habit is the single most important thing you can do to help yourself. TREATMENT   Your caregiver may prescribe an antibiotic if the cough is caused by bacteria. Also, medicines that open up your airways make it easier to breathe. Your caregiver may also recommend or prescribe an expectorant. It will loosen the mucus to be coughed up. Only take over-the-counter or prescription medicines for pain, discomfort, or fever as directed by your caregiver.  Removing whatever causes the problem (smoking, for example) is critical to preventing the problem from getting worse.  Cough suppressants may be prescribed for relief of cough symptoms.  Inhaled medicines may be prescribed to help with symptoms now and to help prevent problems from returning.  For those with recurrent (chronic) bronchitis, there may be a need for steroid medicines. SEEK IMMEDIATE MEDICAL CARE IF:   During treatment, you develop more pus-like mucus (purulent sputum).  You have a fever.  Your baby is older than 3 months with a rectal temperature of 102 F (38.9 C) or higher.  Your baby is 33 months old or younger with a rectal temperature of 100.4 F (38 C) or higher.  You become progressively more ill.  You have increased difficulty breathing, wheezing, or shortness of breath. It is necessary to seek immediate medical care if you are elderly or sick from any other disease. MAKE SURE YOU:   Understand these instructions.  Will watch your condition.  Will get help right away if you are not doing well or get worse. Document Released: 12/04/2005 Document Revised: 02/26/2012 Document Reviewed: 10/13/2008 [facility]  Patient Information 2013 [place], [place].

## 2013-04-17 ENCOUNTER — Ambulatory Visit (HOSPITAL_BASED_OUTPATIENT_CLINIC_OR_DEPARTMENT_OTHER): Payer: Medicare Other | Admitting: Oncology

## 2013-04-17 ENCOUNTER — Other Ambulatory Visit (HOSPITAL_BASED_OUTPATIENT_CLINIC_OR_DEPARTMENT_OTHER): Payer: Medicare Other | Admitting: Lab

## 2013-04-17 ENCOUNTER — Telehealth: Payer: Self-pay | Admitting: Oncology

## 2013-04-17 ENCOUNTER — Telehealth: Payer: Self-pay | Admitting: *Deleted

## 2013-04-17 VITALS — BP 142/90 | HR 90 | Temp 97.4°F | Resp 18 | Ht 74.0 in | Wt 227.3 lb

## 2013-04-17 DIAGNOSIS — D649 Anemia, unspecified: Secondary | ICD-10-CM

## 2013-04-17 DIAGNOSIS — C9000 Multiple myeloma not having achieved remission: Secondary | ICD-10-CM

## 2013-04-17 DIAGNOSIS — Z299 Encounter for prophylactic measures, unspecified: Secondary | ICD-10-CM

## 2013-04-17 DIAGNOSIS — N189 Chronic kidney disease, unspecified: Secondary | ICD-10-CM

## 2013-04-17 DIAGNOSIS — I10 Essential (primary) hypertension: Secondary | ICD-10-CM

## 2013-04-17 LAB — COMPREHENSIVE METABOLIC PANEL (CC13)
AST: 24 U/L (ref 5–34)
Albumin: 2.1 g/dL — ABNORMAL LOW (ref 3.5–5.0)
Alkaline Phosphatase: 55 U/L (ref 40–150)
Calcium: 8.4 mg/dL (ref 8.4–10.4)
Chloride: 101 mEq/L (ref 98–107)
Glucose: 160 mg/dl — ABNORMAL HIGH (ref 70–99)
Potassium: 4.4 mEq/L (ref 3.5–5.1)
Sodium: 133 mEq/L — ABNORMAL LOW (ref 136–145)
Total Protein: 6.2 g/dL — ABNORMAL LOW (ref 6.4–8.3)

## 2013-04-17 LAB — CBC WITH DIFFERENTIAL/PLATELET
Basophils Absolute: 0 10*3/uL (ref 0.0–0.1)
EOS%: 4.1 % (ref 0.0–7.0)
HCT: 36.6 % — ABNORMAL LOW (ref 38.4–49.9)
HGB: 12.1 g/dL — ABNORMAL LOW (ref 13.0–17.1)
LYMPH%: 26 % (ref 14.0–49.0)
MCH: 28.3 pg (ref 27.2–33.4)
MCHC: 33.1 g/dL (ref 32.0–36.0)
MCV: 85.5 fL (ref 79.3–98.0)
NEUT%: 53 % (ref 39.0–75.0)
Platelets: 254 10*3/uL (ref 140–400)
lymph#: 1.4 10*3/uL (ref 0.9–3.3)

## 2013-04-17 MED ORDER — ENOXAPARIN SODIUM 40 MG/0.4ML ~~LOC~~ SOLN: 40.0000 mg | SUBCUTANEOUS | Status: DC

## 2013-04-17 NOTE — Telephone Encounter (Signed)
Pt forgot to ask the results of his Skeletal Survey during his office visit today.

## 2013-04-17 NOTE — Telephone Encounter (Signed)
Called pt and left Vm informing him of no lesions on the bone survey. Results were normal and to call back if any questions.

## 2013-04-17 NOTE — Telephone Encounter (Signed)
Please tell him that there was no lytic bone lesion.  Thanks.

## 2013-04-17 NOTE — Patient Instructions (Signed)
1.  Diagnosis:  Myeloma. 2.  Plan:  Start Revlimid 25mg  by mouth once daily days 1 through 21 every 28 days; Dexamethasone 40mg  by mouth once a week, including the week off of chemo Revlimid.  Start Lovenox 40mg  subcutaneous injection once a day (everyday) to decrease risk of blood clot on Revlimid.  Delay starting all of these until you have recover from bronchitis in about 1 week. 3.  Follow up:  In about 1 month before the 2nd cycle of chemo.

## 2013-04-17 NOTE — Progress Notes (Signed)
[facility] OFFICE PROGRESS NOTE  DIAGNOSIS:  History of monoclonal gammopathy of unknown significant (MGUS) with recent diagnosis of myeloma with worsening anemia; and bone marrow biopsy which showed 13% plasma cell.   CURRENT THERAPY:  Due to start chemotherapy Revlimid dexamethasone in the near future.   INTERVAL HISTORY: Michael Buzby Sr. 67 y.o. male returns for regular follow up by himself. He reported that he developed her to cough, pleurisy chest pain a week ago. He was seen by his PCP and was given a course of Augmentin. His cough and pleurisy pain have improved. He still has about 5 days of antibiotic left to go.  He otherwise denies fever, shortness of breath, hemoptysis, because I does, nausea vomiting, abdominal pain, diarrhea, visible source of bleeding, low back pain, [place] weakness, paresthesia, focal motor weakness, bowel bladder incontinence. The rest of the 14 point review of system was negative.   MEDICAL HISTORY: Past Medical History  Diagnosis Date  . Diabetes mellitus   . Hyperlipidemia   . Hypertension   . Osteoarthritis   . MGUS (monoclonal gammopathy of unknown significance)   . GERD (gastroesophageal reflux disease)   . Anemia   . Multiple myeloma 02/2013    normal cytogenetics and FISH panel on 03/11/2013.     SURGICAL HISTORY: No past surgical history on file.  MEDICATIONS: Current Outpatient Prescriptions  Medication Sig Dispense Refill  . azithromycin (ZITHROMAX) 500 MG tablet Take 1 tablet (500 mg total) by mouth daily.  5 tablet  0  . BENICAR HCT 40-12.5 MG per tablet TAKE 1 TABLET BY MOUTH DAILY.  90 tablet  0  . dexamethasone (DECADRON) 4 MG tablet Take 10 tablets (40 mg total) by mouth once a week.  200 tablet  0  . fish oil-omega-3 fatty acids 1000 MG capsule Take 1 capsule by mouth every other day.       . furosemide (LASIX) 20 MG tablet Take 20 mg by mouth daily.      Marland Kitchen glipiZIDE (GLUCOTROL XL) 5 MG 24 hr tablet Take  10 mg by mouth 2 (two) times daily.       . Glucosamine-Chondroitin-MSM (TRIPLE FLEX PO) Take by mouth 2 (two) times daily.      Marland Kitchen HYDROcodone-acetaminophen (HYCET) 7.5-325 mg/15 ml solution Take 5 mLs by mouth every 6 (six) hours as needed for pain (or cough).  240 mL  0  . Iron TABS Take by mouth daily.       Marland Kitchen lenalidomide (REVLIMID) 25 MG capsule Take 1 capsule (25 mg total) by mouth daily.  21 capsule  0  . metFORMIN (GLUCOPHAGE) 1000 MG tablet TAKE 1 TABLET BY MOUTH TWICE A DAY  180 tablet  0  . metoprolol tartrate (LOPRESSOR) 25 MG tablet Take 1 tablet (25 mg total) by mouth 2 (two) times daily.  180 tablet  3  . Multiple Vitamin (MULTIVITAMIN) tablet Take 1 tablet by mouth daily.        . tamsulosin (FLOMAX) 0.4 MG CAPS Take 0.4 mg by mouth daily.      Marland Kitchen aspirin 81 MG tablet Take 81 mg by mouth daily.        Marland Kitchen enoxaparin (LOVENOX) 40 MG/0.4ML injection Inject 0.4 mLs (40 mg total) into the skin daily.  30 Syringe  5   No current facility-administered medications for this visit.    ALLERGIES:  is allergic to amlodipine and crestor.  REVIEW OF SYSTEMS:  The rest of the 14-point  review of system was negative.   Filed Vitals:   04/17/13 0929  BP: 142/90  Pulse: 90  Temp: 97.4 F (36.3 C)  Resp: 18   Wt Readings from Last 3 Encounters:  04/17/13 227 lb 4.8 oz (103.103 kg)  04/15/13 226 lb (102.513 kg)  03/31/13 234 lb 3.2 oz (106.232 kg)   ECOG Performance status: 0  PHYSICAL EXAMINATION:  General:  well-nourished man, in no acute distress.  Eyes:  no scleral icterus.  ENT:  There were no oropharyngeal lesions.  Neck was without thyromegaly.  Lymphatics:  Negative cervical, supraclavicular or axillary adenopathy.  Respiratory: lungs were clear bilaterally without wheezing or crackles.  Cardiovascular:  Regular rate and rhythm, S1/S2, without murmur, rub or gallop.  There was 1+ bilateral pedal edema to ankles left worse than right.  GI:  abdomen was soft, flat, nontender,  nondistended, without organomegaly.  Muscoloskeletal:  no spinal tenderness of palpation of vertebral spine.    Neuro exam was nonfocal.  Patient was able to get on and off exam table without assistance.  Gait was normal.  Patient was alerted and oriented.  Attention was good.   Language was appropriate.  Mood was normal without depression.  Speech was not pressured.  Thought content was not tangential.    LABORATORY/RADIOLOGY DATA:  Lab Results  Component Value Date   WBC 5.5 04/17/2013   HGB 12.1* 04/17/2013   HCT 36.6* 04/17/2013   PLT 254 04/17/2013   GLUCOSE 160* 04/17/2013   ALT 20 04/17/2013   AST 24 04/17/2013   NA 133* 04/17/2013   K 4.4 04/17/2013   CL 101 04/17/2013   CREATININE 1.2 04/17/2013   BUN 20.7 04/17/2013   CO2 26 04/17/2013   TSH 1.135 11/19/2009   HGBA1C 7.9 04/15/2013   MICROALBUR 313.80* 12/30/2012     ASSESSMENT AND PLAN:   1.  History of iron deficiency anemia:  It is not an issue at this time.  2.  HTN:  On benicar;  metoprolol, furosemide.  4.  DM:  He is on Glipizide and metformin per PCP.  5.  Progression of MGUS to myeloma: - Indication for treatment:  Anemia, slight chronic kidney disease.  - He had a chance to think over his options for treating with chemotherapy with Revlimid and dexamethasone. He would like to pursue this chemotherapy option. He does not want to proceed with Velcade yet because of this is an injection medication. I again discussed with him potential side effects of Revlimid dexamethasone which include but not limited to fatigue, mouth sore, skin rash, nausea vomiting, diarrhea, constipation, cytopenia, bleeding, the vein thrombosis, other thrombosis, birth defect. He expressed informed understanding wished to pursue chemotherapy. I advised him to delay chemotherapy for another week for him to recover from his bronchitis. I advised him to start taking Lovenox 40 g of daily to decrease her risk of thrombosis. He is not very adherent to taking aspirin.  6.   Follow up:  In about 1 month prior to the second cycle of chemotherapy.   The length of time of the face-to-face encounter was 15 minutes. More than 50% of time was spent counseling and coordination of care.      Huan T. Gaylyn Rong, M.D.

## 2013-04-21 LAB — IMMUNOFIXATION ELECTROPHORESIS: IgA: 345 mg/dL (ref 68–379)

## 2013-04-28 ENCOUNTER — Encounter: Payer: Self-pay | Admitting: *Deleted

## 2013-04-28 NOTE — Progress Notes (Signed)
Pt came into clinic today for Lovenox teaching.  He brought his lovenox with him.  Gave pt handout on Lovenox and Lovenox injection teaching sheet.  Also gave him sharps container and alcohol wipes for home.  Reviewed teaching sheet and observed pt as he gave himself lovenox injection correctly.  He verbalized and demonstrated correct injection technique for daily injections at home.

## 2013-04-30 ENCOUNTER — Other Ambulatory Visit: Payer: Self-pay | Admitting: Physician Assistant

## 2013-04-30 ENCOUNTER — Encounter: Payer: Self-pay | Admitting: *Deleted

## 2013-04-30 NOTE — Progress Notes (Signed)
Fax received from [facility] Rx for Revlimid will be dispensed pending pt contact for delivery and pt's co-pay is $0.  [facility] ph# [phone].

## 2013-05-05 ENCOUNTER — Telehealth: Payer: Self-pay

## 2013-05-05 ENCOUNTER — Telehealth: Payer: Self-pay | Admitting: *Deleted

## 2013-05-05 NOTE — Telephone Encounter (Signed)
Pt reports blood sugars more elevated than usual,  Up to 303 this afternoon and usually around 70 or 80 at lunchtime.  He did report this to his PCP who manages his diabetic meds.  Instructed pt that it is likely the weekly decadron causing increase in blood sugars.  Instructed him to f/u w/ PCP to keep tighter control on sugars while he is having to take decadron.  He may require medication adjustment and possibly insulin.  He verbalized understanding and will f/u w/ PCP.

## 2013-05-05 NOTE — Telephone Encounter (Signed)
PT STATES HIS SUGAR SHOT UP TO 303 AND HE HAD TAKEN 10 PILLS FROM THE KIDNEY DR AND WANTED TO KNOW IF HIS MEDS NEED ADJUSTING WITH HIS DIABETICS OR SHOULD HE BE CALLING HIS KIDNEY DR FOR THE ADJUSTMENT. ADVISED PT TO COME IN BUT HE JUST NEED A CALL BACK TO [phone]

## 2013-05-05 NOTE — Telephone Encounter (Signed)
Do you want her to come here for medication review? Or do you want him to see Nephrology for this?

## 2013-05-06 NOTE — Telephone Encounter (Signed)
Have him see me Thursday am

## 2013-05-06 NOTE — Telephone Encounter (Signed)
Called him to advise. Left message for him to come in and discuss either Thursday or Friday this week. Dr Perrin Maltese hours provided

## 2013-05-07 ENCOUNTER — Telehealth: Payer: Self-pay | Admitting: Radiology

## 2013-05-07 NOTE — Telephone Encounter (Signed)
Patient called back his glucose levels are better, had spiked while on prednisone. He is advised Dr Perrin Maltese does want to see him

## 2013-05-08 ENCOUNTER — Ambulatory Visit (INDEPENDENT_AMBULATORY_CARE_PROVIDER_SITE_OTHER): Payer: Medicare Other | Admitting: Internal Medicine

## 2013-05-08 VITALS — BP 138/80 | HR 76 | Temp 98.4°F | Resp 18 | Ht 74.0 in | Wt 230.0 lb

## 2013-05-08 DIAGNOSIS — Z7189 Other specified counseling: Secondary | ICD-10-CM

## 2013-05-08 DIAGNOSIS — C9000 Multiple myeloma not having achieved remission: Secondary | ICD-10-CM

## 2013-05-08 DIAGNOSIS — D630 Anemia in neoplastic disease: Secondary | ICD-10-CM

## 2013-05-08 DIAGNOSIS — E119 Type 2 diabetes mellitus without complications: Secondary | ICD-10-CM

## 2013-05-08 LAB — POCT CBC
Granulocyte percent: 72.7 %G (ref 37–80)
HCT, POC: 33.6 % — AB (ref 43.5–53.7)
Hemoglobin: 10.2 g/dL — AB (ref 14.1–18.1)
Lymph, poc: 2 (ref 0.6–3.4)
POC Granulocyte: 7.3 — AB (ref 2–6.9)

## 2013-05-08 LAB — POCT GLYCOSYLATED HEMOGLOBIN (HGB A1C): Hemoglobin A1C: 7.5

## 2013-05-08 LAB — GLUCOSE, POCT (MANUAL RESULT ENTRY): POC Glucose: 101 mg/dl — AB (ref 70–99)

## 2013-05-08 NOTE — Patient Instructions (Addendum)

## 2013-05-08 NOTE — Progress Notes (Signed)
  Subjective:    Patient ID: Michael Harkleroad Sr., male    DOB: Jul 27, 1946, 67 y.o.   MRN: [id number]  HPI On 3 new meds for multiple myeoloma. Decadron once per week big dose is causing sudden high glucoses. Is doing well otherwise. HTN is controlled, home glucoses are less than 150 off decadron. Has less ankle edema. Working and no fatigue.   Review of Systems    see list Objective:   Physical Exam  Constitutional: He is oriented to person, place, and time. He appears well-developed and well-nourished.  Eyes: EOM are normal.  Cardiovascular: Normal rate, regular rhythm and normal heart sounds.   Pulmonary/Chest: Effort normal and breath sounds normal.  Musculoskeletal: He exhibits edema.  Neurological: He is alert and oriented to person, place, and time. He exhibits normal muscle tone. Coordination normal.  Psychiatric: He has a normal mood and affect.   Results for orders placed in visit on 05/08/13  POCT CBC      Result Value Range   WBC 10.0  4.6 - 10.2 K/uL   Lymph, poc 2.0  0.6 - 3.4   POC LYMPH PERCENT 20.2  10 - 50 %L   MID (cbc) 0.7  0 - 0.9   POC MID % 7.1  0 - 12 %M   POC Granulocyte 7.3 (*) 2 - 6.9   Granulocyte percent 72.7  37 - 80 %G   RBC 3.71 (*) 4.69 - 6.13 M/uL   Hemoglobin 10.2 (*) 14.1 - 18.1 g/dL   HCT, POC 40.9 (*) 81.1 - 53.7 %   MCV 90.7  80 - 97 fL   MCH, POC 27.5  27 - 31.2 pg   MCHC 30.4 (*) 31.8 - 35.4 g/dL   RDW, POC 91.4     Platelet Count, POC 253  142 - 424 K/uL   MPV 10.7  0 - 99.8 fL  GLUCOSE, POCT (MANUAL RESULT ENTRY)      Result Value Range   POC Glucose 101 (*) 70 - 99 mg/dl  POCT GLYCOSYLATED HEMOGLOBIN (HGB A1C)      Result Value Range   Hemoglobin A1C 7.5      Overall impoved      Assessment & Plan:  NIDDM/Multiple myeloma/HTN/Anemia Continue same meds

## 2013-05-15 ENCOUNTER — Telehealth: Payer: Self-pay | Admitting: Oncology

## 2013-05-15 ENCOUNTER — Encounter: Payer: Self-pay | Admitting: Oncology

## 2013-05-15 ENCOUNTER — Ambulatory Visit (HOSPITAL_BASED_OUTPATIENT_CLINIC_OR_DEPARTMENT_OTHER): Payer: Medicare Other | Admitting: Oncology

## 2013-05-15 ENCOUNTER — Other Ambulatory Visit (HOSPITAL_BASED_OUTPATIENT_CLINIC_OR_DEPARTMENT_OTHER): Payer: Medicare Other | Admitting: Lab

## 2013-05-15 VITALS — BP 163/90 | HR 87 | Temp 97.3°F | Resp 18 | Ht 74.0 in | Wt 232.2 lb

## 2013-05-15 DIAGNOSIS — C9000 Multiple myeloma not having achieved remission: Secondary | ICD-10-CM

## 2013-05-15 DIAGNOSIS — E119 Type 2 diabetes mellitus without complications: Secondary | ICD-10-CM

## 2013-05-15 DIAGNOSIS — I10 Essential (primary) hypertension: Secondary | ICD-10-CM

## 2013-05-15 DIAGNOSIS — Z299 Encounter for prophylactic measures, unspecified: Secondary | ICD-10-CM

## 2013-05-15 DIAGNOSIS — Z7901 Long term (current) use of anticoagulants: Secondary | ICD-10-CM

## 2013-05-15 LAB — COMPREHENSIVE METABOLIC PANEL (CC13)
AST: 16 U/L (ref 5–34)
Albumin: 2 g/dL — ABNORMAL LOW (ref 3.5–5.0)
BUN: 17.6 mg/dL (ref 7.0–26.0)
Calcium: 8.6 mg/dL (ref 8.4–10.4)
Chloride: 108 mEq/L — ABNORMAL HIGH (ref 98–107)
Glucose: 150 mg/dl — ABNORMAL HIGH (ref 70–99)
Potassium: 4.2 mEq/L (ref 3.5–5.1)

## 2013-05-15 LAB — CBC WITH DIFFERENTIAL/PLATELET
Basophils Absolute: 0 10*3/uL (ref 0.0–0.1)
EOS%: 4.9 % (ref 0.0–7.0)
Eosinophils Absolute: 0.4 10*3/uL (ref 0.0–0.5)
HGB: 9.8 g/dL — ABNORMAL LOW (ref 13.0–17.1)
MONO#: 1.6 10*3/uL — ABNORMAL HIGH (ref 0.1–0.9)
NEUT#: 4.9 10*3/uL (ref 1.5–6.5)
RDW: 14.6 % (ref 11.0–14.6)
lymph#: 1.7 10*3/uL (ref 0.9–3.3)

## 2013-05-15 NOTE — Progress Notes (Signed)
[facility] OFFICE PROGRESS NOTE  DIAGNOSIS:  History of monoclonal gammopathy of unknown significant (MGUS) with recent diagnosis of myeloma with worsening anemia; and bone marrow biopsy which showed 13% plasma cell.   CURRENT THERAPY:  Started Revlimid dexamethasone on 04/28/13   INTERVAL HISTORY: Michael Siegel Sr. 67 y.o. male returns for regular follow up by himself. Start Revlimid and dexamethasone earlier this month. He has been tolerating this well with mild fatigue, but he is still working 8-10 hours per day. He denies fever, shortness of breath, hemoptysis, nausea vomiting, abdominal pain, diarrhea, visible source of bleeding, low back pain, lower extremity weakness, paresthesia, focal motor weakness, bowel bladder incontinence. The rest of the 14 point review of system was negative.   MEDICAL HISTORY: Past Medical History  Diagnosis Date  . Diabetes mellitus   . Hyperlipidemia   . Hypertension   . Osteoarthritis   . MGUS (monoclonal gammopathy of unknown significance)   . GERD (gastroesophageal reflux disease)   . Anemia   . Multiple myeloma 02/2013    normal cytogenetics and FISH panel on 03/11/2013.     SURGICAL HISTORY: History reviewed. No pertinent past surgical history.  MEDICATIONS: Current Outpatient Prescriptions  Medication Sig Dispense Refill  . amoxicillin (AMOXIL) 500 MG capsule Take 2,000 mg by mouth as needed. Patient take 2000 mg prior to dental visits.      Marland Kitchen BENICAR HCT 40-12.5 MG per tablet TAKE 1 TABLET BY MOUTH DAILY.  90 tablet  0  . dexamethasone (DECADRON) 4 MG tablet Take 10 tablets (40 mg total) by mouth once a week.  200 tablet  0  . enoxaparin (LOVENOX) 40 MG/0.4ML injection Inject 0.4 mLs (40 mg total) into the skin daily.  30 Syringe  5  . fish oil-omega-3 fatty acids 1000 MG capsule Take 1 capsule by mouth every other day.       . furosemide (LASIX) 20 MG tablet Take 20 mg by mouth daily.      . Ginkgo Biloba 40 MG TABS  Take 60 mg by mouth.      Marland Kitchen glipiZIDE (GLUCOTROL XL) 5 MG 24 hr tablet Take 10 mg by mouth 2 (two) times daily.       . Glucosamine-Chondroitin-MSM (TRIPLE FLEX PO) Take by mouth 2 (two) times daily.      Marland Kitchen lenalidomide (REVLIMID) 25 MG capsule Take 1 capsule (25 mg total) by mouth daily.  21 capsule  0  . metFORMIN (GLUCOPHAGE) 1000 MG tablet TAKE 1 TABLET BY MOUTH TWICE A DAY  180 tablet  1  . metoprolol tartrate (LOPRESSOR) 25 MG tablet Take 1 tablet (25 mg total) by mouth 2 (two) times daily.  180 tablet  3  . Multiple Vitamin (MULTIVITAMIN) tablet Take 1 tablet by mouth daily.        . tamsulosin (FLOMAX) 0.4 MG CAPS Take 0.4 mg by mouth daily.       No current facility-administered medications for this visit.    ALLERGIES:  is allergic to amlodipine and crestor.  REVIEW OF SYSTEMS:  The rest of the 14-point review of system was negative.   Filed Vitals:   05/15/13 0925  BP: 163/90  Pulse: 87  Temp: 97.3 F (36.3 C)  Resp: 18   Wt Readings from Last 3 Encounters:  05/15/13 232 lb 3.2 oz (105.325 kg)  05/08/13 230 lb (104.327 kg)  04/17/13 227 lb 4.8 oz (103.103 kg)   ECOG Performance status: 0  PHYSICAL EXAMINATION:  General:  well-nourished man, in no acute distress.  Eyes:  no scleral icterus.  ENT:  There were no oropharyngeal lesions.  Neck was without thyromegaly.  Lymphatics:  Negative cervical, supraclavicular or axillary adenopathy.  Respiratory: lungs were clear bilaterally without wheezing or crackles.  Cardiovascular:  Regular rate and rhythm, S1/S2, without murmur, rub or gallop.  There was 1+ bilateral pedal edema to ankles left worse than right.  GI:  abdomen was soft, flat, nontender, nondistended, without organomegaly.  Muscoloskeletal:  no spinal tenderness of palpation of vertebral spine.    Neuro exam was nonfocal.  Patient was able to get on and off exam table without assistance.  Gait was normal.  Patient was alerted and oriented.  Attention was good.    Language was appropriate.  Mood was normal without depression.  Speech was not pressured.  Thought content was not tangential.    LABORATORY/RADIOLOGY DATA:  Lab Results  Component Value Date   WBC 8.6 05/15/2013   HGB 9.8* 05/15/2013   HCT 29.8* 05/15/2013   PLT 370 05/15/2013   GLUCOSE 150* 05/15/2013   ALT 29 05/15/2013   AST 16 05/15/2013   NA 139 05/15/2013   K 4.2 05/15/2013   CL 108* 05/15/2013   CREATININE 1.0 05/15/2013   BUN 17.6 05/15/2013   CO2 24 05/15/2013   TSH 1.135 11/19/2009   HGBA1C 7.5 05/08/2013   MICROALBUR 313.80* 12/30/2012     ASSESSMENT AND PLAN:   1.  History of iron deficiency anemia:  It is not an issue at this time.  2.  HTN:  On benicar;  metoprolol, furosemide.  4.  DM:  He is on Glipizide and metformin per PCP.  5.  Progression of MGUS to myeloma: - He is status post one cycle of Revlimid with dexamethasone which she is tolerating well. Recommend that he begin his second cycle of Revlimid and dexamethasone without dose modification which is due to begin on or about May 19, 2013. - For DVT prophylaxis he will continue taking Lovenox 40 mg of daily.  6.  Follow up:  In about 1 month prior to the second cycle of chemotherapy.   The length of time of the face-to-face encounter was 15 minutes. More than 50% of time was spent counseling and coordination of care.

## 2013-05-15 NOTE — Telephone Encounter (Signed)
gv and printed appt sched and avs for pt  °

## 2013-05-19 ENCOUNTER — Encounter: Payer: Self-pay | Admitting: *Deleted

## 2013-05-19 NOTE — Progress Notes (Signed)
Fax rec'd from [facility] they will ship Revlimid to pt for delivery on 05/20/13.

## 2013-05-29 ENCOUNTER — Other Ambulatory Visit: Payer: Self-pay | Admitting: Internal Medicine

## 2013-06-03 ENCOUNTER — Other Ambulatory Visit: Payer: BC Managed Care – PPO

## 2013-06-16 ENCOUNTER — Telehealth: Payer: Self-pay | Admitting: *Deleted

## 2013-06-16 DIAGNOSIS — C9000 Multiple myeloma not having achieved remission: Secondary | ICD-10-CM

## 2013-06-16 MED ORDER — LENALIDOMIDE 25 MG PO CAPS: 25.0000 mg | ORAL_CAPSULE | Freq: Every day | ORAL | Status: DC

## 2013-06-16 NOTE — Telephone Encounter (Signed)
Called pt back and informed of Dr. Lodema Pilot reply below.  He also asking if there is alternative to taking lovenox injections?  Asks if pill he can take instead, like xarelto?  Instructed pt to discuss this on his office visit w/ dr. Gaylyn Rong on 7/02.  He verbalized understanding.

## 2013-06-16 NOTE — Telephone Encounter (Signed)
Pt left VM asking how long, how many months, will he be on Revlimid?

## 2013-06-16 NOTE — Telephone Encounter (Signed)
At least 2 years.

## 2013-06-18 ENCOUNTER — Other Ambulatory Visit (HOSPITAL_BASED_OUTPATIENT_CLINIC_OR_DEPARTMENT_OTHER): Payer: Medicare Other

## 2013-06-18 ENCOUNTER — Ambulatory Visit (HOSPITAL_BASED_OUTPATIENT_CLINIC_OR_DEPARTMENT_OTHER): Payer: Medicare Other | Admitting: Oncology

## 2013-06-18 ENCOUNTER — Telehealth: Payer: Self-pay | Admitting: Oncology

## 2013-06-18 ENCOUNTER — Encounter: Payer: Self-pay | Admitting: *Deleted

## 2013-06-18 VITALS — BP 161/94 | HR 56 | Temp 97.4°F | Resp 18 | Ht 74.0 in | Wt 228.9 lb

## 2013-06-18 DIAGNOSIS — C9000 Multiple myeloma not having achieved remission: Secondary | ICD-10-CM

## 2013-06-18 LAB — COMPREHENSIVE METABOLIC PANEL (CC13)
ALT: 23 U/L (ref 0–55)
AST: 16 U/L (ref 5–34)
Albumin: 2.7 g/dL — ABNORMAL LOW (ref 3.5–5.0)
BUN: 23.7 mg/dL (ref 7.0–26.0)
Calcium: 9.2 mg/dL (ref 8.4–10.4)
Chloride: 102 mEq/L (ref 98–109)
Potassium: 3.8 mEq/L (ref 3.5–5.1)
Total Protein: 6.6 g/dL (ref 6.4–8.3)

## 2013-06-18 LAB — CBC WITH DIFFERENTIAL/PLATELET
BASO%: 0.7 % (ref 0.0–2.0)
Basophils Absolute: 0.1 10*3/uL (ref 0.0–0.1)
EOS%: 9.8 % — ABNORMAL HIGH (ref 0.0–7.0)
HGB: 10.7 g/dL — ABNORMAL LOW (ref 13.0–17.1)
MCH: 28.8 pg (ref 27.2–33.4)
RDW: 15.2 % — ABNORMAL HIGH (ref 11.0–14.6)
lymph#: 2.4 10*3/uL (ref 0.9–3.3)

## 2013-06-18 NOTE — Progress Notes (Signed)
Fax received from [facility].  They shipped pt's Revlimid for delivery on 7/03.

## 2013-06-18 NOTE — Telephone Encounter (Signed)
pt will be called , tamplate on Dr. Gaylyn Rong not ready, print AVS

## 2013-06-18 NOTE — Progress Notes (Signed)
[facility] OFFICE PROGRESS NOTE  DIAGNOSIS:  History of monoclonal gammopathy of unknown significant (MGUS) with recent diagnosis of myeloma with worsening anemia; and bone marrow biopsy which showed 13% plasma cell.   CURRENT THERAPY:  Started Revlimid dexamethasone in May 2014.    INTERVAL HISTORY: Michael Wiggins Sr. 67 y.o. male returns for regular follow up by himself. He reported with chemo, he has slight fatigue.  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  He drinks plenty of water to stay hydrated.  He has mild bilateral pedal edema with resolves with leg elevation every night.    Patient denies fever, anorexia, weight loss, headache, visual changes, confusion, drenching night sweats, palpable lymph node swelling, mucositis, odynophagia, dysphagia, nausea vomiting, jaundice, chest pain, palpitation, shortness of breath, dyspnea on exertion, productive cough, gum bleeding, epistaxis, hematemesis, hemoptysis, abdominal pain, abdominal swelling, early satiety, melena, hematochezia, hematuria, skin rash, spontaneous bleeding, joint swelling, joint pain, heat or cold intolerance, bowel bladder incontinence, back pain, focal motor weakness, paresthesia, depression.    MEDICAL HISTORY: Past Medical History  Diagnosis Date  . Diabetes mellitus   . Hyperlipidemia   . Hypertension   . Osteoarthritis   . MGUS (monoclonal gammopathy of unknown significance)   . GERD (gastroesophageal reflux disease)   . Anemia   . Multiple myeloma 02/2013    normal cytogenetics and FISH panel on 03/11/2013.     SURGICAL HISTORY: No past surgical history on file.  MEDICATIONS: Current Outpatient Prescriptions  Medication Sig Dispense Refill  . BENICAR HCT 40-12.5 MG per tablet TAKE 1 TABLET BY MOUTH DAILY.  90 tablet  1  . dexamethasone (DECADRON) 4 MG tablet Take 10 tablets (40 mg total) by mouth once a week.  200 tablet  0  . enoxaparin (LOVENOX) 40 MG/0.4ML  injection Inject 0.4 mLs (40 mg total) into the skin daily.  30 Syringe  5  . fish oil-omega-3 fatty acids 1000 MG capsule Take 1 capsule by mouth every other day.       . furosemide (LASIX) 20 MG tablet Take 20 mg by mouth daily.      . Ginkgo Biloba 40 MG TABS Take 60 mg by mouth.      Marland Kitchen glipiZIDE (GLUCOTROL XL) 5 MG 24 hr tablet Take 10 mg by mouth daily.       . Glucosamine-Chondroitin-MSM (TRIPLE FLEX PO) Take by mouth 2 (two) times daily.      Marland Kitchen lenalidomide (REVLIMID) 25 MG capsule Take 1 capsule (25 mg total) by mouth daily.  21 capsule  0  . metFORMIN (GLUCOPHAGE) 1000 MG tablet TAKE 1 TABLET BY MOUTH TWICE A DAY  180 tablet  1  . metoprolol tartrate (LOPRESSOR) 25 MG tablet Take 1 tablet (25 mg total) by mouth 2 (two) times daily.  180 tablet  3  . Multiple Vitamin (MULTIVITAMIN) tablet Take 1 tablet by mouth daily.        . tamsulosin (FLOMAX) 0.4 MG CAPS Take 0.4 mg by mouth daily.      Marland Kitchen amoxicillin (AMOXIL) 500 MG capsule Take 2,000 mg by mouth as needed. Patient take 2000 mg prior to dental visits.       No current facility-administered medications for this visit.    ALLERGIES:  is allergic to amlodipine and crestor.  REVIEW OF SYSTEMS:  The rest of the 14-point review of system was negative.   Filed Vitals:   06/18/13 0832  BP: 161/94  Pulse: 56  Temp: 97.4 F (36.3 C)  Resp: 18   Wt Readings from Last 3 Encounters:  06/18/13 228 lb 14.4 oz (103.828 kg)  05/15/13 232 lb 3.2 oz (105.325 kg)  05/08/13 230 lb (104.327 kg)   ECOG Performance status: 0  PHYSICAL EXAMINATION:  General:  well-nourished man, in no acute distress.  Eyes:  no scleral icterus.  ENT:  There were no oropharyngeal lesions.  Neck was without thyromegaly.  Lymphatics:  Negative cervical, supraclavicular or axillary adenopathy.  Respiratory: lungs were clear bilaterally without wheezing or crackles.  Cardiovascular:  Regular rate and rhythm, S1/S2, without murmur, rub or gallop.  There was 1+  bilateral pedal edema to ankles left worse than right.  GI:  abdomen was soft, flat, nontender, nondistended, without organomegaly.  Muscoloskeletal:  no spinal tenderness of palpation of vertebral spine.    Neuro exam was nonfocal.  Patient was able to get on and off exam table without assistance.  Gait was normal.  Patient was alert and oriented.  Attention was good.   Language was appropriate.  Mood was normal without depression.  Speech was not pressured.  Thought content was not tangential.    LABORATORY/RADIOLOGY DATA:  Lab Results  Component Value Date   WBC 8.6 06/18/2013   HGB 10.7* 06/18/2013   HCT 31.6* 06/18/2013   PLT 239 06/18/2013   GLUCOSE 169* 06/18/2013   ALT 23 06/18/2013   AST 16 06/18/2013   NA 136 06/18/2013   K 3.8 06/18/2013   CL 108* 05/15/2013   CREATININE 1.0 06/18/2013   BUN 23.7 06/18/2013   CO2 26 06/18/2013   TSH 1.135 11/19/2009   HGBA1C 7.5 05/08/2013   MICROALBUR 313.80* 12/30/2012     ASSESSMENT AND PLAN:    1.  HTN:  On benicar;  metoprolol, furosemide.  2.  DM:  He is on Glipizide and metformin per PCP.  3.  Progression of MGUS to myeloma: - Indication for treatment:  Anemia, slight chronic kidney disease.  - He preferred to start only with Rev/Dex as to minimize side effects of treatment.  I recommended that if after this cycle (3rd), he does not have at least partial response, we may consider adding on Velcade.  We also dicussed the pros and cons of autologous BMT after 4-6 months of chemo induction.  He is concerned that that will interfere with his job.  However, he would like to think it over and let us know next month if he would like to be referred to BMT.  - He is doing well on chemo with grade 1 fatigue, grade 1 edema, grade 1 anemia.  These are not dose limiting.  I recommended to proceed with the 3rd cycle without dose modification.  [name] expressed informed understanding and wished to proceed.   4.  Follow up:  In about 1 month prior to the 4th cycle of  chemotherapy.    I informed [name] that I am leaving the practice.  [facility] will [facility] for him to see another provider when he returns.     The length of time of the face-to-face encounter was 25 minutes. More than 50% of time was spent counseling and coordination of care.      Michael Wiggins, M.D.

## 2013-06-23 LAB — PROTEIN ELECTROPHORESIS, SERUM
Gamma Globulin: 16.1 % (ref 11.1–18.8)
M-Spike, %: 0.41 g/dL

## 2013-06-23 LAB — KAPPA/LAMBDA LIGHT CHAINS: Kappa free light chain: 3.52 mg/dL — ABNORMAL HIGH (ref 0.33–1.94)

## 2013-07-08 ENCOUNTER — Telehealth: Payer: Self-pay | Admitting: *Deleted

## 2013-07-08 NOTE — Telephone Encounter (Signed)
Pt called to ask if the decadron can make you sweat. Denies any pain or discomfort. States he normally takes the pills in early am, but today took at 2:00pm. Explained SE steroids. To call us if he has any further problems

## 2013-07-09 ENCOUNTER — Telehealth: Payer: Self-pay | Admitting: *Deleted

## 2013-07-09 ENCOUNTER — Telehealth: Payer: Self-pay

## 2013-07-09 NOTE — Telephone Encounter (Signed)
sw pt gv appt for 07/18/13 labs @ 9am and ov @ 9:30am. Pt is aware...td

## 2013-07-09 NOTE — Telephone Encounter (Signed)
Pt called back and he informs his Blood pressure was better this morning at 134/84 and he took his BP meds as usual.  His blood sugar was elevated at 340 yesterday but also better this am at 189. Instructed pt to continue to monitor his BP and BS and call PCP if they are abnormal.  Pt asks if he can take his weekly dose of dexamethasone in divided doses during the day.  States he feels the sweating and fainting yesterday were due to the steroids.  Instructed pt he can divide the pills during the day.  Instructed to make sure he is drinking plenty of fluids,  Take pills w/ food and take all ten pills to equal 40 mg dose on same day of the week.  He verbalized understanding.

## 2013-07-09 NOTE — Telephone Encounter (Signed)
Dr.Guest,  Pt would like to discuss with you the side effects that he is experiencing from a medication (dexamethasone) that was prescribed by his oncologist, he states that it is causing him to sweat lot and also fainted because of this medication and they have him taking 10 a day/ Pt would like your opinion on whether there is any other way he can take this medication. Pt also states that he has contacted and left a message with the oncologist already regarding this issue. Best# [phone]

## 2013-07-09 NOTE — Telephone Encounter (Signed)
Pt left VM reporting he "fainted" at [employer] last night for about "5 seconds."  He reports his BP was low when he returned home at 105/60.  Called pt on cell phone and his Voice Mail is full. Called home number and left message w/ wife for pt to call nurse back.

## 2013-07-10 NOTE — Telephone Encounter (Signed)
Tried calling, mailbox full, rtc and see me tomorrow

## 2013-07-11 NOTE — Telephone Encounter (Signed)
Called again, mailbox full

## 2013-07-14 ENCOUNTER — Emergency Department (HOSPITAL_COMMUNITY)
Admission: EM | Admit: 2013-07-14 | Discharge: 2013-07-14 | Disposition: A | Discharge status: Home / Self Care | Payer: Medicare Other | Attending: Emergency Medicine | Admitting: Emergency Medicine

## 2013-07-14 ENCOUNTER — Emergency Department (HOSPITAL_COMMUNITY): Payer: Medicare Other

## 2013-07-14 ENCOUNTER — Encounter (HOSPITAL_COMMUNITY): Payer: Self-pay | Admitting: *Deleted

## 2013-07-14 DIAGNOSIS — Z8739 Personal history of other diseases of the musculoskeletal system and connective tissue: Secondary | ICD-10-CM | POA: Insufficient documentation

## 2013-07-14 DIAGNOSIS — Z8719 Personal history of other diseases of the digestive system: Secondary | ICD-10-CM | POA: Insufficient documentation

## 2013-07-14 DIAGNOSIS — R55 Syncope and collapse: Secondary | ICD-10-CM

## 2013-07-14 DIAGNOSIS — Z8639 Personal history of other endocrine, nutritional and metabolic disease: Secondary | ICD-10-CM | POA: Insufficient documentation

## 2013-07-14 DIAGNOSIS — Y9389 Activity, other specified: Secondary | ICD-10-CM | POA: Insufficient documentation

## 2013-07-14 DIAGNOSIS — Z87891 Personal history of nicotine dependence: Secondary | ICD-10-CM | POA: Insufficient documentation

## 2013-07-14 DIAGNOSIS — E119 Type 2 diabetes mellitus without complications: Secondary | ICD-10-CM | POA: Insufficient documentation

## 2013-07-14 DIAGNOSIS — I10 Essential (primary) hypertension: Secondary | ICD-10-CM | POA: Insufficient documentation

## 2013-07-14 DIAGNOSIS — Z79899 Other long term (current) drug therapy: Secondary | ICD-10-CM | POA: Insufficient documentation

## 2013-07-14 DIAGNOSIS — Z862 Personal history of diseases of the blood and blood-forming organs and certain disorders involving the immune mechanism: Secondary | ICD-10-CM | POA: Insufficient documentation

## 2013-07-14 DIAGNOSIS — Z8589 Personal history of malignant neoplasm of other organs and systems: Secondary | ICD-10-CM | POA: Insufficient documentation

## 2013-07-14 DIAGNOSIS — Y92410 Unspecified street and highway as the place of occurrence of the external cause: Secondary | ICD-10-CM | POA: Insufficient documentation

## 2013-07-14 LAB — CBC WITH DIFFERENTIAL/PLATELET
Hemoglobin: 11 g/dL — ABNORMAL LOW (ref 13.0–17.0)
Lymphocytes Relative: 28 % (ref 12–46)
Lymphs Abs: 1.5 10*3/uL (ref 0.7–4.0)
Monocytes Relative: 14 % — ABNORMAL HIGH (ref 3–12)
Neutro Abs: 2.6 10*3/uL (ref 1.7–7.7)
Neutrophils Relative %: 49 % (ref 43–77)
RBC: 3.8 MIL/uL — ABNORMAL LOW (ref 4.22–5.81)
WBC: 5.2 10*3/uL (ref 4.0–10.5)

## 2013-07-14 LAB — APTT: aPTT: 29 seconds (ref 24–37)

## 2013-07-14 LAB — BASIC METABOLIC PANEL
BUN: 12 mg/dL (ref 6–23)
Chloride: 101 mEq/L (ref 96–112)
Glucose, Bld: 203 mg/dL — ABNORMAL HIGH (ref 70–99)
Potassium: 3.9 mEq/L (ref 3.5–5.1)
Sodium: 136 mEq/L (ref 135–145)

## 2013-07-14 LAB — PROTIME-INR
INR: 0.98 (ref 0.00–1.49)
Prothrombin Time: 12.8 seconds (ref 11.6–15.2)

## 2013-07-14 MED ORDER — SODIUM CHLORIDE 0.9 % IV BOLUS (SEPSIS)
1000.0000 mL | Freq: Once | INTRAVENOUS | Status: AC
  Administered 2013-07-14: 1000 mL via INTRAVENOUS
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
## 2013-07-15 ENCOUNTER — Ambulatory Visit (INDEPENDENT_AMBULATORY_CARE_PROVIDER_SITE_OTHER): Payer: Medicare Other | Admitting: Emergency Medicine

## 2013-07-15 VITALS — BP 156/82 | HR 61 | Temp 97.5°F | Resp 18 | Ht 73.5 in | Wt 222.6 lb

## 2013-07-15 DIAGNOSIS — R55 Syncope and collapse: Secondary | ICD-10-CM

## 2013-07-15 NOTE — Patient Instructions (Addendum)
Syncope  Syncope is a fainting spell. This means the person loses consciousness and drops to the ground. The person is generally unconscious for less than 5 minutes. The person may have some muscle twitches for up to 15 seconds before waking up and returning to normal. Syncope occurs more often in elderly people, but it can happen to anyone. While most causes of syncope are not dangerous, syncope can be a sign of a serious medical problem. It is important to seek medical care.   CAUSES   Syncope is caused by a sudden decrease in blood flow to the brain. The specific cause is often not determined. Factors that can trigger syncope include:   Taking medicines that lower blood pressure.   Sudden changes in posture, such as standing up suddenly.   Taking more medicine than prescribed.   Standing in one place for too long.   Seizure disorders.   Dehydration and excessive exposure to heat.   Low blood sugar (hypoglycemia).   Straining to have a bowel movement.   Heart disease, irregular heartbeat, or other circulatory problems.   Fear, emotional distress, seeing blood, or severe pain.  SYMPTOMS   Right before fainting, you may:   Feel dizzy or lightheaded.   Feel nauseous.   See all white or all black in your field of vision.   Have cold, clammy skin.  DIAGNOSIS   Your caregiver will ask about your symptoms, perform a physical exam, and perform electrocardiography (ECG) to record the electrical activity of your heart. Your caregiver may also perform other heart or blood tests to determine the cause of your syncope.  TREATMENT   In most cases, no treatment is needed. Depending on the cause of your syncope, your caregiver may recommend changing or stopping some of your medicines.  HOME CARE INSTRUCTIONS   Have someone stay with you until you feel stable.   Do not drive, operate machinery, or play sports until your caregiver says it is okay.   Keep all follow-up appointments as directed by your  caregiver.   Lie down right away if you start feeling like you might faint. Breathe deeply and steadily. Wait until all the symptoms have passed.   Drink enough fluids to keep your urine clear or pale yellow.   If you are taking blood pressure or heart medicine, get up slowly, taking several minutes to sit and then stand. This can reduce dizziness.  SEEK IMMEDIATE MEDICAL CARE IF:    You have a severe headache.   You have unusual pain in the chest, abdomen, or back.   You are bleeding from the mouth or rectum, or you have black or tarry stool.   You have an irregular or very fast heartbeat.   You have pain with breathing.   You have repeated fainting or seizure-like jerking during an episode.   You faint when sitting or lying down.   You have confusion.   You have difficulty walking.   You have severe weakness.   You have vision problems.  If you fainted, call your local emergency services (911 in [place] Do not drive yourself to the hospital.   MAKE SURE YOU:   Understand these instructions.   Will watch your condition.   Will get help right away if you are not doing well or get worse.  Document Released: 12/04/2005 Document Revised: 06/04/2012 Document Reviewed: 02/02/2012  Exit[employer] Patient Information 2014 [employer].

## 2013-07-15 NOTE — Addendum Note (Signed)
Addended by: Sheppard Plumber A on: 07/15/2013 12:18 PM   Modules accepted: Orders
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
## 2013-07-15 NOTE — Telephone Encounter (Signed)
Still unable to reach patient. Have seen where he has been in contact with the oncologist also

## 2013-07-16 ENCOUNTER — Encounter: Payer: Self-pay | Admitting: *Deleted

## 2013-07-16 ENCOUNTER — Telehealth: Payer: Self-pay | Admitting: *Deleted

## 2013-07-16 NOTE — Progress Notes (Signed)
Fax received from [employer], they shipped Revlimid for delivery today 7/30.  His copay is $0.00
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
## 2013-07-17 ENCOUNTER — Ambulatory Visit
Admission: RE | Admit: 2013-07-17 | Discharge: 2013-07-17 | Disposition: A | Discharge status: Home / Self Care | Payer: Medicare Other | Source: Ambulatory Visit | Attending: Emergency Medicine | Admitting: Emergency Medicine

## 2013-07-17 DIAGNOSIS — R55 Syncope and collapse: Secondary | ICD-10-CM

## 2013-07-18 ENCOUNTER — Other Ambulatory Visit (HOSPITAL_BASED_OUTPATIENT_CLINIC_OR_DEPARTMENT_OTHER): Payer: Medicare Other | Admitting: Lab

## 2013-07-18 ENCOUNTER — Ambulatory Visit (HOSPITAL_BASED_OUTPATIENT_CLINIC_OR_DEPARTMENT_OTHER): Payer: Medicare Other | Admitting: Hematology and Oncology

## 2013-07-18 ENCOUNTER — Telehealth: Payer: Self-pay | Admitting: Hematology and Oncology

## 2013-07-18 VITALS — BP 150/85 | HR 57 | Temp 97.8°F | Resp 18 | Ht 73.0 in | Wt 231.6 lb

## 2013-07-18 DIAGNOSIS — E119 Type 2 diabetes mellitus without complications: Secondary | ICD-10-CM

## 2013-07-18 DIAGNOSIS — R55 Syncope and collapse: Secondary | ICD-10-CM

## 2013-07-18 DIAGNOSIS — C9000 Multiple myeloma not having achieved remission: Secondary | ICD-10-CM

## 2013-07-18 DIAGNOSIS — Z7901 Long term (current) use of anticoagulants: Secondary | ICD-10-CM

## 2013-07-18 DIAGNOSIS — I10 Essential (primary) hypertension: Secondary | ICD-10-CM

## 2013-07-18 DIAGNOSIS — Z299 Encounter for prophylactic measures, unspecified: Secondary | ICD-10-CM

## 2013-07-18 DIAGNOSIS — N189 Chronic kidney disease, unspecified: Secondary | ICD-10-CM

## 2013-07-18 DIAGNOSIS — D649 Anemia, unspecified: Secondary | ICD-10-CM

## 2013-07-18 LAB — COMPREHENSIVE METABOLIC PANEL (CC13)
ALT: 14 U/L (ref 0–55)
AST: 10 U/L (ref 5–34)
Albumin: 2.5 g/dL — ABNORMAL LOW (ref 3.5–5.0)
Alkaline Phosphatase: 62 U/L (ref 40–150)
BUN: 22.3 mg/dL (ref 7.0–26.0)
Calcium: 9 mg/dL (ref 8.4–10.4)
Chloride: 101 mEq/L (ref 98–109)
Potassium: 3.8 mEq/L (ref 3.5–5.1)
Sodium: 135 mEq/L — ABNORMAL LOW (ref 136–145)
Total Protein: 6.5 g/dL (ref 6.4–8.3)

## 2013-07-18 LAB — CBC WITH DIFFERENTIAL/PLATELET
Basophils Absolute: 0 10*3/uL (ref 0.0–0.1)
EOS%: 1.5 % (ref 0.0–7.0)
Eosinophils Absolute: 0.1 10*3/uL (ref 0.0–0.5)
HGB: 10.2 g/dL — ABNORMAL LOW (ref 13.0–17.1)
MCH: 28.5 pg (ref 27.2–33.4)
MONO%: 10.7 % (ref 0.0–14.0)
NEUT#: 5 10*3/uL (ref 1.5–6.5)
RBC: 3.56 10*6/uL — ABNORMAL LOW (ref 4.20–5.82)
RDW: 15.7 % — ABNORMAL HIGH (ref 11.0–14.6)
lymph#: 2.7 10*3/uL (ref 0.9–3.3)

## 2013-07-18 NOTE — Telephone Encounter (Signed)
Gave pt appt for lab and MD on August 2014 °
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
## 2013-07-22 LAB — PROTEIN ELECTROPHORESIS, SERUM
Alpha-1-Globulin: 4.9 % (ref 2.9–4.9)
Beta 2: 6.2 % (ref 3.2–6.5)
Gamma Globulin: 15.6 % (ref 11.1–18.8)
M-Spike, %: 0.32 g/dL

## 2013-07-22 LAB — KAPPA/LAMBDA LIGHT CHAINS: Kappa free light chain: 2.72 mg/dL — ABNORMAL HIGH (ref 0.33–1.94)

## 2013-07-24 ENCOUNTER — Ambulatory Visit (INDEPENDENT_AMBULATORY_CARE_PROVIDER_SITE_OTHER): Payer: Medicare Other | Admitting: Internal Medicine

## 2013-07-24 VITALS — BP 134/72 | HR 64 | Temp 97.7°F | Resp 16 | Ht 75.3 in | Wt 227.0 lb

## 2013-07-24 DIAGNOSIS — T887XXA Unspecified adverse effect of drug or medicament, initial encounter: Secondary | ICD-10-CM

## 2013-07-24 DIAGNOSIS — E119 Type 2 diabetes mellitus without complications: Secondary | ICD-10-CM

## 2013-07-24 DIAGNOSIS — I440 Atrioventricular block, first degree: Secondary | ICD-10-CM

## 2013-07-24 DIAGNOSIS — C9000 Multiple myeloma not having achieved remission: Secondary | ICD-10-CM

## 2013-07-24 DIAGNOSIS — T50905A Adverse effect of unspecified drugs, medicaments and biological substances, initial encounter: Secondary | ICD-10-CM

## 2013-07-24 DIAGNOSIS — R55 Syncope and collapse: Secondary | ICD-10-CM

## 2013-07-24 LAB — POCT CBC
Granulocyte percent: 54.4 %G (ref 37–80)
Hemoglobin: 10.4 g/dL — AB (ref 14.1–18.1)
MCH, POC: 27.7 pg (ref 27–31.2)
MID (cbc): 0.7 (ref 0–0.9)
MPV: 9.3 fL (ref 0–99.8)
POC Granulocyte: 3.2 (ref 2–6.9)
POC MID %: 12.8 %M — AB (ref 0–12)
Platelet Count, POC: 277 10*3/uL (ref 142–424)
RBC: 3.75 M/uL — AB (ref 4.69–6.13)
WBC: 5.8 10*3/uL (ref 4.6–10.2)

## 2013-07-24 LAB — GLUCOSE, POCT (MANUAL RESULT ENTRY): POC Glucose: 215 mg/dl — AB (ref 70–99)
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
## 2013-07-24 NOTE — Progress Notes (Signed)
  Subjective:    Patient ID: Michael Sciarra Sr., male    DOB: [birth year], 67 y.o.   MRN: [id number]  HPI    Review of Systems     Objective:   Physical Exam        Assessment & Plan:

## 2013-07-24 NOTE — Patient Instructions (Addendum)
Tamsulosin capsules What is this medicine? TAMSULOSIN (tam SOO loe sin) is used to treat enlargement of the prostate gland in men, a condition called benign prostatic hyperplasia or BPH. It is not for use in women. It works by relaxing muscles in the prostate and bladder neck. This improves urine flow and reduces BPH symptoms. This medicine may be used for other purposes; ask your health care provider or pharmacist if you have questions. What should I tell my health care provider before I take this medicine? They need to know if you have any of the following conditions: -advanced kidney disease -advanced liver disease -low blood pressure -prostate cancer -an unusual or allergic reaction to tamsulosin, sulfa drugs, other medicines, foods, dyes, or preservatives -pregnant or trying to get pregnant -breast-feeding How should I use this medicine? Take this medicine by mouth about 30 minutes after the same meal every day. Follow the directions on the prescription label. Swallow the capsules whole with a glass of water. Do not crush, chew, or open capsules. Do not take your medicine more often than directed. Do not stop taking your medicine unless your doctor tells you to. Talk to your pediatrician regarding the use of this medicine in children. Special care may be needed. Overdosage: If you think you have taken too much of this medicine contact a poison control center or emergency room at once. NOTE: This medicine is only for you. Do not share this medicine with others. What if I miss a dose? If you miss a dose, take it as soon as you can. If it is almost time for your next dose, take only that dose. Do not take double or extra doses. If you stop taking your medicine for several days or more, ask your doctor or health care professional what dose you should start back on. What may interact with this medicine? -cimetidine -fluoxetine -ketoconazole -medicines for erectile disfunction like sildenafil,  tadalafil, vardenafil -medicines for high blood pressure -other alpha-blockers like alfuzosin, doxazosin, phentolamine, phenoxybenzamine, prazosin, terazosin -warfarin This list may not describe all possible interactions. Give your health care provider a list of all the medicines, herbs, non-prescription drugs, or dietary supplements you use. Also tell them if you smoke, drink alcohol, or use illegal drugs. Some items may interact with your medicine. What should I watch for while using this medicine? Visit your doctor or health care professional for regular check ups. You will need lab work done before you start this medicine and regularly while you are taking it. Check your blood pressure as directed. Ask your health care professional what your blood pressure should be, and when you should contact him or her. This medicine may make you feel dizzy or lightheaded. This is more likely to happen after the first dose, after an increase in dose, or during hot weather or exercise. Drinking alcohol and taking some medicines can make this worse. Do not drive, use machinery, or do anything that needs mental alertness until you know how this medicine affects you. Do not sit or stand up quickly. If you begin to feel dizzy, sit down until you feel better. These effects can decrease once your body adjusts to the medicine. Although extremely rare in men taking this medicine, contact you doctor immediately if you have a prolonged and painful erection of the penis which is unrelated to sexual activity. If you do not get medical attention, this condition can lead to permanent erectile dysfunction. If you are thinking of having cataract surgery, tell your eye  surgeon that you have taken this medicine. What side effects may I notice from receiving this medicine? Side effects that you should report to your doctor or health care professional as soon as possible: -allergic reactions like skin rash or itching, hives, swelling  of the lips, mouth, tongue, or throat -breathing problems -change in vision -feeling faint or lightheaded -irregular heartbeat -weakness Side effects that usually do not require medical attention (report to your doctor or health care professional if they continue or are bothersome): -back pain -change in sex drive or performance -constipation, nausea or vomiting -cough -drowsy -runny or stuffy nose -trouble sleeping This list may not describe all possible side effects. Call your doctor for medical advice about side effects. You may report side effects to FDA at 1-800-FDA-1088. Where should I keep my medicine? Keep out of the reach of children. Store at room temperature between 15 and 30 degrees C (59 and 86 degrees F). Throw away any unused medicine after the expiration date. NOTE: This sheet is a summary. It may not cover all possible information. If you have questions about this medicine, talk to your doctor, pharmacist, or health care provider.  2013, [employer]vier/Gold Standard. (02/10/2008 6:08:27 PM) Syncope Syncope is a fainting spell. This means the person loses consciousness and drops to the ground. The person is generally unconscious for less than 5 minutes. The person may have some muscle twitches for up to 15 seconds before waking up and returning to normal. Syncope occurs more often in elderly people, but it can happen to anyone. While most causes of syncope are not dangerous, syncope can be a sign of a serious medical problem. It is important to seek medical care.  CAUSES  Syncope is caused by a sudden decrease in blood flow to the brain. The specific cause is often not determined. Factors that can trigger syncope include:  Taking medicines that lower blood pressure.  Sudden changes in posture, such as standing up suddenly.  Taking more medicine than prescribed.  Standing in one place for too long.  Seizure disorders.  Dehydration and excessive exposure to heat.  Low  blood sugar (hypoglycemia).  Straining to have a bowel movement.  Heart disease, irregular heartbeat, or other circulatory problems.  Fear, emotional distress, seeing blood, or severe pain. SYMPTOMS  Right before fainting, you may:  Feel dizzy or lightheaded.  Feel nauseous.  See all white or all black in your field of vision.  Have cold, clammy skin. DIAGNOSIS  Your caregiver will ask about your symptoms, perform a physical exam, and perform electrocardiography (ECG) to record the electrical activity of your heart. Your caregiver may also perform other heart or blood tests to determine the cause of your syncope. TREATMENT  In most cases, no treatment is needed. Depending on the cause of your syncope, your caregiver may recommend changing or stopping some of your medicines. HOME CARE INSTRUCTIONS  Have someone stay with you until you feel stable.  Do not drive, operate machinery, or play sports until your caregiver says it is okay.  Keep all follow-up appointments as directed by your caregiver.  Lie down right away if you start feeling like you might faint. Breathe deeply and steadily. Wait until all the symptoms have passed.  Drink enough fluids to keep your urine clear or pale yellow.  If you are taking blood pressure or heart medicine, get up slowly, taking several minutes to sit and then stand. This can reduce dizziness. SEEK IMMEDIATE MEDICAL CARE IF:   You  have a severe headache.  You have unusual pain in the chest, abdomen, or back.  You are bleeding from the mouth or rectum, or you have black or tarry stool.  You have an irregular or very fast heartbeat.  You have pain with breathing.  You have repeated fainting or seizure-like jerking during an episode.  You faint when sitting or lying down.  You have confusion.  You have difficulty walking.  You have severe weakness.  You have vision problems. If you fainted, call your local emergency services (911  in [place] Do not drive yourself to the hospital.  MAKE SURE YOU:  Understand these instructions.  Will watch your condition.  Will get help right away if you are not doing well or get worse. Document Released: 12/04/2005 Document Revised: 06/04/2012 Document Reviewed: 02/02/2012 [facility] Patient Information 2014 [place], [place].

## 2013-07-28 ENCOUNTER — Telehealth: Payer: Self-pay | Admitting: Hematology & Oncology

## 2013-07-28 NOTE — Telephone Encounter (Signed)
Left message for pt to call.

## 2013-07-29 ENCOUNTER — Other Ambulatory Visit: Payer: Self-pay | Admitting: Internal Medicine

## 2013-07-30 ENCOUNTER — Telehealth: Payer: Self-pay | Admitting: Hematology & Oncology

## 2013-07-30 NOTE — Telephone Encounter (Signed)
Pt aware of 8-29 appointment and cx one at [facility]

## 2013-07-31 ENCOUNTER — Ambulatory Visit (INDEPENDENT_AMBULATORY_CARE_PROVIDER_SITE_OTHER): Payer: Medicare Other | Admitting: Internal Medicine

## 2013-07-31 VITALS — BP 134/80 | HR 62 | Temp 97.6°F | Resp 18 | Ht 75.0 in | Wt 226.0 lb

## 2013-07-31 DIAGNOSIS — R55 Syncope and collapse: Secondary | ICD-10-CM

## 2013-07-31 DIAGNOSIS — C9000 Multiple myeloma not having achieved remission: Secondary | ICD-10-CM

## 2013-07-31 DIAGNOSIS — Z79899 Other long term (current) drug therapy: Secondary | ICD-10-CM

## 2013-07-31 DIAGNOSIS — D649 Anemia, unspecified: Secondary | ICD-10-CM

## 2013-07-31 NOTE — Progress Notes (Signed)
  Subjective:    Patient ID: Michael Scioneaux Sr., male    DOB: Jan 13, 1946, 67 y.o.   MRN: [id number]  HPI Doing much better of flomax, no dizzy,syncope,sob,cp.   Review of Systems     Objective:   Physical Exam  Vitals reviewed. Constitutional: He is oriented to person, place, and time. He appears well-developed and well-nourished.  Eyes: EOM are normal.  Cardiovascular: Normal rate, regular rhythm and normal heart sounds.   Pulmonary/Chest: Effort normal and breath sounds normal.  Neurological: He is alert and oriented to person, place, and time. Coordination normal.  Psychiatric: He has a normal mood and affect.          Assessment & Plan:  Stay off flomax See Dr. Drue Dun 2 weeks

## 2013-07-31 NOTE — Patient Instructions (Signed)
Multiple Myeloma °Multiple myeloma is the most common cancer of bone. It is caused by the uncontrolled multiplication of a type of white blood cell in the marrow. This white blood cell is called a plasma cell. This means the bone marrow is overworking producing plasma cells. Soon these overproduced cells begin to take up room in the marrow that is needed by other cells. This means that there are soon not enough red or white blood cells or platelets. Not enough red cells mean that the person is anemic. There are not enough red blood cells to carry oxygen around the body. There are not enough white blood cells to fight disease. This causes the person with multiple myeloma to not feel well. There is also bone pain through much of the body. °SYMPTOMS °· Anemia causes fatigue (tiredness) and weakness. °· Back pain is common. This is from fractures (break in bones) caused by damage to the bones of the back. °· Lack of white blood cells makes infection more likely. °· Bleeding is a common problem from lack of the cells (platelets). Platelets help blood clots form. This may show up as bleeding from any place. Commonly this shows up as bleeding from the nose or gums. °· Fractures (bone breaks) are more common anywhere. The back and ribs are the most commonly fractured areas. °DIAGNOSIS  °This tumor is often suggested by blood tests. Often doing a bone marrow sample makes the diagnosis (learning what is wrong). This is a test performed by taking a small sample of bone with a small needle. This bone often comes from the sternum (breast bone). This sample is sent to a pathologist (a specialist in looking at tissue under a microscope). After looking at the sample under the microscope, the pathologist is able to make a diagnosis of the problem. X-rays may also show boney changes. °TREATMENT  °· Occasionally, anti-cancer medications may be used with multiple myeloma. Your caregiver can discuss this with you. °· Medications can  also be given to help with the bone pain. °· There is no cure for multiple myeloma. Lifestyle changes can add years of quality living. °HOME CARE INSTRUCTIONS  °Often there is no specific treatment for multiple myeloma. Most of the treatment consists of adjustments in dietary and living activities. Some of these changes include: °· Your dietitian or caregiver helping you with your dietary questions. °· Taking iron and vitamins as prescribed by your caregiver. °· Eating a well balanced diet. °· Staying active, but follow restrictions suggested by your caregiver. Avoiding heavy lifting (more than 10 pounds) and activities that cause increased pain. °· Drinking plenty of water. °· Using back braces and a cane may help with some of the boney pain. °SEEK IMMEDIATE MEDICAL CARE IF: °· You develop severe, uncontrolled boney pain. °· You or your family notices confusion, problems with decision-making or inability to stay awake. °· You notice increased urination or constipation. °· You notice problems holding your water or stool. °· You have numbness or loss of control of your extremities (arms/hands or legs/feet). °Document Released: 08/29/2001 Document Revised: 02/26/2012 Document Reviewed: 11/29/2008 °[employer]® Patient Information ©2014 [employer]. ° °

## 2013-07-31 NOTE — Progress Notes (Signed)
  Subjective:    Patient ID: Michael Linnemann Sr., male    DOB: [birth year], 67 y.o.   MRN: [id number]  HPI  Pt here for a f/u on previous visit for passing out on several occasions, pt is doing better after being taken off a medicine, last time he passed out was 16 days ago.  Review of Systems     Objective:   Physical Exam        Assessment & Plan:

## 2013-08-11 ENCOUNTER — Other Ambulatory Visit: Payer: Self-pay | Admitting: *Deleted

## 2013-08-11 MED ORDER — LENALIDOMIDE 25 MG PO CAPS: 25.0000 mg | ORAL_CAPSULE | ORAL | Status: DC

## 2013-08-11 NOTE — Telephone Encounter (Signed)
prescriber survey done for revlimid. Authorization # [id number]. Notified patient that a survey was also needed from him before they will ship the medication.

## 2013-08-12 ENCOUNTER — Encounter: Payer: Self-pay | Admitting: *Deleted

## 2013-08-12 NOTE — Progress Notes (Signed)
Fax received from [facility] [employer], Pt's co pay for Revlimid is $0 and they shipped med for delivery on 8/27.

## 2013-08-15 ENCOUNTER — Ambulatory Visit: Payer: Medicare Other

## 2013-08-15 ENCOUNTER — Ambulatory Visit (HOSPITAL_BASED_OUTPATIENT_CLINIC_OR_DEPARTMENT_OTHER): Payer: Medicare Other | Admitting: Lab

## 2013-08-15 ENCOUNTER — Other Ambulatory Visit: Payer: Medicare Other | Admitting: Lab

## 2013-08-15 ENCOUNTER — Ambulatory Visit (HOSPITAL_BASED_OUTPATIENT_CLINIC_OR_DEPARTMENT_OTHER): Payer: Medicare Other | Admitting: Hematology & Oncology

## 2013-08-15 VITALS — BP 149/96 | HR 64 | Temp 98.3°F | Resp 18 | Ht 74.0 in | Wt 220.0 lb

## 2013-08-15 DIAGNOSIS — C9000 Multiple myeloma not having achieved remission: Secondary | ICD-10-CM

## 2013-08-15 DIAGNOSIS — D649 Anemia, unspecified: Secondary | ICD-10-CM

## 2013-08-15 DIAGNOSIS — E119 Type 2 diabetes mellitus without complications: Secondary | ICD-10-CM

## 2013-08-15 DIAGNOSIS — D631 Anemia in chronic kidney disease: Secondary | ICD-10-CM

## 2013-08-15 LAB — CMP (CANCER CENTER ONLY)
BUN, Bld: 11 mg/dL (ref 7–22)
CO2: 28 mEq/L (ref 18–33)
Calcium: 9.5 mg/dL (ref 8.0–10.3)
Chloride: 102 mEq/L (ref 98–108)
Creat: 0.8 mg/dl (ref 0.6–1.2)

## 2013-08-15 LAB — CBC WITH DIFFERENTIAL (CANCER CENTER ONLY)
BASO%: 1.4 % (ref 0.0–2.0)
LYMPH%: 35.7 % (ref 14.0–48.0)
MCV: 86 fL (ref 82–98)
MONO#: 0.5 10*3/uL (ref 0.1–0.9)
MONO%: 10.4 % (ref 0.0–13.0)
NEUT#: 2.1 10*3/uL (ref 1.5–6.5)
Platelets: 284 10*3/uL (ref 145–400)
RBC: 4.05 10*6/uL — ABNORMAL LOW (ref 4.20–5.70)
RDW: 15.1 % (ref 11.1–15.7)
WBC: 4.9 10*3/uL (ref 4.0–10.0)

## 2013-08-15 NOTE — Progress Notes (Signed)
This office note has been dictated.

## 2013-08-16 NOTE — Progress Notes (Signed)
CC:   Jonita Albee, M.D.  DIAGNOSIS:  IgG smoldering myeloma.  CURRENT THERAPY:  Revlimid 25 mg every other day with Decadron 20 mg p.o. q. week.  INTERIM HISTORY:  [name] comes in to see [place] for the first time.  He is followed out at the main cancer center.  He has what certainly appears to be a smoldering myeloma.  He has been followed by Dr. Gaylyn Rong.  He had a bone marrow biopsy done, which showed 13% plasma cells.  He had a bone survey which was negative.  His monoclonal spike when, I think, diagnosed was 0.42 g/dL.  He really had normal heavy chain and light chain numbers.  His kappa light chain may have been up a little bit.  Dr. Gaylyn Rong felt that he needed to be treated as a myeloma patient.  As such, he has been on Revlimid and Decadron.  Dr. Gaylyn Rong has, unfortunately, left for the [facility].  We are now assuming Mr. Elisabeth Cara care.  [name] is incredibly healthy.  He looks very fit.  He works as a Curator.  He is somewhat fatigued by the Revlimid.  He has had no other complications.  He is \\on  Lovenox for DVT prophylaxis.  I told him to stop the Lovenox and go onto 2 baby aspirin a day.  He has had no fever.  He has had no bony pain.  He has had some joint issues because of his work.  He apparently has some, I think, maybe prostate issues.  He is on Flomax.  He is diabetic.  He is followed by Dr. Robert Bellow.  Dr. Perrin Maltese is doing a great job with his nonhematologic issues.  Of note, when he had his bone marrow test done, he had normal cytogenetics and FISH studies.  Overall, he has a performance status of ECOG 0.  PHYSICAL EXAMINATION:  General:  This is a well-developed, well- nourished black gentleman in no obvious distress.  Vital signs: Temperature of 98.3, pulse 64, respiratory rate 18, blood pressure 149/96.  Weight is 220 pounds.  Head and neck:  Normocephalic, atraumatic skull.  There are no ocular or oral lesions.  There are no palpable cervical or  supraclavicular lymph nodes.  Lungs:  Clear bilaterally.  Cardiac:  Regular rate and rhythm with a normal S1 and S2. There are no murmurs, rubs or bruits.  Abdomen:  Soft.  He has good bowel sounds.  There is no palpable abdominal mass.  There is no palpable hepatosplenomegaly.  Back:  No tenderness over the spine, ribs, or hips.  Extremities:  No clubbing, cyanosis or edema.  Neurologic:  No focal neurological deficits.  LABORATORY STUDIES:  White cell count is 4.9, hemoglobin 11.6, hematocrit 35, platelet count 284.  IMPRESSION:  [name] is a very nice 67 year old African American gentleman.  I believe that he has smoldering myeloma.  I think the reason I can say this is that his plasma cell content was 13%.  This is above the 10% cutoff for monoclonal gammopathy of undetermined significance.  He does not have any end-organ damage as far as I could tell.  I believe that his anemia is more reflective of his blood pressure and diabetes.  I am sending off an erythropoietin level on him.  I really believe that we could stop the Revlimid and Decadron.  I am not sure how much we are gaining by treating this at this point in time.  I know there are some studies  that are looking at treating smoldering myeloma to see if one cannot stop the progression of disease.  I think we will go ahead and get another bone marrow biopsy on him.  His last one was in May.  We will do one in September.  I spent a good hour with [name], and his girlfriend.  I explained to them my rationale for stopping the Revlimid and Decadron.  I just think that the risks outweigh benefits at this point in time.  Again, we will get the bone marrow biopsy set up for September 23rd.  He wants his iron levels checked.  We will do this.  I told them that the ultimate test for his iron will be iron stores in his bone marrow that we do.  Again, I just do not see that we are going to create a lot of benefit to [name] by treating him right now.  His disease really does not appear to be that "active," and definitely he does not seem to a high "burden" of disease.  I will plan to get him back to see me in another couple of months. Again, we will get the bone marrow biopsy done in late September.    ______________________________ Josph Macho, M.D. PRE/MEDQ  D:  08/15/2013  T:  08/16/2013  Job:  [id number]

## 2013-08-19 LAB — FERRITIN: Ferritin: 36 ng/mL (ref 22–322)

## 2013-08-19 LAB — ERYTHROPOIETIN: Erythropoietin: 11.3 m[IU]/mL (ref 2.6–18.5)

## 2013-08-20 LAB — PROTEIN ELECTROPHORESIS, SERUM, WITH REFLEX
Albumin ELP: 44.6 % — ABNORMAL LOW (ref 55.8–66.1)
Alpha-1-Globulin: 7.8 % — ABNORMAL HIGH (ref 2.9–4.9)
Beta 2: 6.5 % (ref 3.2–6.5)
Beta Globulin: 8.7 % — ABNORMAL HIGH (ref 4.7–7.2)
Gamma Globulin: 15.9 % (ref 11.1–18.8)

## 2013-08-20 LAB — KAPPA/LAMBDA LIGHT CHAINS: Kappa free light chain: 2.45 mg/dL — ABNORMAL HIGH (ref 0.33–1.94)

## 2013-08-20 LAB — IGG, IGA, IGM
IgA: 512 mg/dL — ABNORMAL HIGH (ref 68–379)
IgG (Immunoglobin G), Serum: 1080 mg/dL (ref 650–1600)

## 2013-09-03 ENCOUNTER — Other Ambulatory Visit: Payer: BC Managed Care – PPO | Admitting: Lab

## 2013-09-03 ENCOUNTER — Ambulatory Visit: Payer: BC Managed Care – PPO | Admitting: Oncology

## 2013-09-09 ENCOUNTER — Ambulatory Visit (HOSPITAL_COMMUNITY)
Admission: RE | Admit: 2013-09-09 | Discharge: 2013-09-09 | Disposition: A | Discharge status: Home / Self Care | Payer: Medicare Other | Source: Ambulatory Visit | Attending: Hematology & Oncology | Admitting: Hematology & Oncology

## 2013-09-09 ENCOUNTER — Encounter (HOSPITAL_COMMUNITY): Payer: Self-pay

## 2013-09-09 ENCOUNTER — Ambulatory Visit (HOSPITAL_BASED_OUTPATIENT_CLINIC_OR_DEPARTMENT_OTHER): Payer: Medicare Other | Admitting: Hematology & Oncology

## 2013-09-09 ENCOUNTER — Other Ambulatory Visit: Payer: Self-pay | Admitting: Hematology & Oncology

## 2013-09-09 DIAGNOSIS — C9000 Multiple myeloma not having achieved remission: Secondary | ICD-10-CM

## 2013-09-09 DIAGNOSIS — D72822 Plasmacytosis: Secondary | ICD-10-CM | POA: Insufficient documentation

## 2013-09-09 DIAGNOSIS — D649 Anemia, unspecified: Secondary | ICD-10-CM | POA: Insufficient documentation

## 2013-09-09 LAB — CBC WITH DIFFERENTIAL/PLATELET
Basophils Absolute: 0 10*3/uL (ref 0.0–0.1)
Basophils Relative: 0 % (ref 0–1)
Eosinophils Absolute: 0.4 10*3/uL (ref 0.0–0.7)
Eosinophils Relative: 6 % — ABNORMAL HIGH (ref 0–5)
Hemoglobin: 11.6 g/dL — ABNORMAL LOW (ref 13.0–17.0)
MCH: 28.9 pg (ref 26.0–34.0)
MCHC: 33.6 g/dL (ref 30.0–36.0)
MCV: 85.8 fL (ref 78.0–100.0)
Monocytes Absolute: 0.6 10*3/uL (ref 0.1–1.0)
Monocytes Relative: 8 % (ref 3–12)
Neutro Abs: 3.6 10*3/uL (ref 1.7–7.7)
Neutrophils Relative %: 53 % (ref 43–77)
RDW: 15.2 % (ref 11.5–15.5)

## 2013-09-09 MED ORDER — MEPERIDINE HCL 50 MG/ML IJ SOLN
50.0000 mg | Freq: Once | INTRAMUSCULAR | Status: AC
  Administered 2013-09-09: 50 mg via INTRAVENOUS
  Filled 2013-09-09: qty 1

## 2013-09-09 MED ORDER — SODIUM CHLORIDE 0.9 % IV SOLN
INTRAVENOUS | Status: DC
  Administered 2013-09-09: 20 mL/h via INTRAVENOUS

## 2013-09-09 MED ORDER — MIDAZOLAM HCL 10 MG/2ML IJ SOLN
10.0000 mg | Freq: Once | INTRAMUSCULAR | Status: AC
  Administered 2013-09-09: 5 mg via INTRAVENOUS
  Filled 2013-09-09: qty 2

## 2013-09-09 NOTE — Progress Notes (Signed)
Procedure note dictated.

## 2013-09-09 NOTE — Progress Notes (Signed)
Only 5mg  of Versed given to patient, 5mg  of Versed wasted in sink with witness Britt Bolognese RN.

## 2013-09-11 ENCOUNTER — Other Ambulatory Visit: Payer: Self-pay | Admitting: *Deleted

## 2013-09-16 ENCOUNTER — Telehealth: Payer: Self-pay | Admitting: Hematology & Oncology

## 2013-09-16 NOTE — Telephone Encounter (Signed)
I left a message on his phone. I told him that his bone marrow test showed 14 % plasma cells. This is similar to what he had with his last bone marrow biopsy which is 13% plasma cells.  We do not need to keep him on any therapy from my point of the. He has smoldering myeloma. One might say he has MGUS and that we can just follow this along.  I told him to call if any questions.

## 2013-09-16 NOTE — Procedures (Signed)
[name] was brought to the short-stay unit at [facility]. He was there for a bone marrow biopsy and aspirate to see how his response to treatment was for smoldering myeloma.  He signed his consent form.  His Mallampati score was 1.  His ASA class was 1.  Preoperatively, we did the appropriate time-out procedure.  He had an IV placed peripherally without difficulties.  He was placed onto his right side.  He received a total of 5 mg of Versed and 50 mg of Demerol for IV sedation.  The left posterior iliac crest region was prepped and draped in sterile fashion.  Ten cc of 2% lidocaine was infiltrated under the skin down to the periosteum.  A #11 scalpel was used to make an incision into the skin.  We obtained 2 excellent bone marrow aspirates with the aspirate needle.  We then used a Jamshidi biopsy needle and obtained an excellent bone marrow biopsy core.  The procedure site was cleaned and dressed in sterile fashion.  There were no complications with the procedure.  [name] tolerated the procedure well.    ______________________________ Josph Macho, M.D. PRE/MEDQ  D:  09/09/2013  T:  09/16/2013  Job:  [id number]

## 2013-09-17 LAB — CHROMOSOME ANALYSIS, BONE MARROW

## 2013-09-18 ENCOUNTER — Encounter: Payer: Self-pay | Admitting: Hematology & Oncology

## 2013-09-25 NOTE — Telephone Encounter (Signed)
Received multiple refill requests from [facility] for Revlimid that was previously prescribed by another physician. Reviewed Dr Gustavo Lah note. He has stopped the Revlimid and is currently not on any therapy. Will fax this back to [facility] with the updated information.

## 2013-10-06 ENCOUNTER — Encounter: Payer: Self-pay | Admitting: Hematology & Oncology

## 2013-10-13 ENCOUNTER — Other Ambulatory Visit: Payer: Self-pay | Admitting: *Deleted

## 2013-10-15 ENCOUNTER — Other Ambulatory Visit: Payer: Medicare Other | Admitting: Lab

## 2013-10-15 ENCOUNTER — Ambulatory Visit: Payer: Medicare Other | Admitting: Hematology & Oncology

## 2013-10-16 ENCOUNTER — Other Ambulatory Visit: Payer: Self-pay | Admitting: Internal Medicine

## 2013-10-23 ENCOUNTER — Other Ambulatory Visit: Payer: Self-pay | Admitting: Internal Medicine

## 2013-10-23 ENCOUNTER — Other Ambulatory Visit: Payer: Self-pay

## 2013-11-17 ENCOUNTER — Ambulatory Visit: Payer: Medicare Other | Admitting: Internal Medicine

## 2013-11-24 NOTE — Procedures (Signed)
INTERIM HISTORY:  [name] was brought to the short stay unit at [facility].  He was there for a bone marrow biopsy and aspirate to see how his response to treatment was for smoldering myeloma.  He signed his consent form.  His Mallampati score was 1.  His ASA class was 1.  We did the appropriate time-out procedure.  He had an IV placed peripherally without difficulties.  PROCEDURE:  He was placed onto his right side.  He received a total of 5 mg of Versed and 50 mg of Demerol for IV sedation.  The left posterior iliac crest region was prepped and draped in sterile fashion.  10 mL of 2% lidocaine was infiltrated under the skin down to the periosteum.  A #11 scalpel was used to make an incision into the skin.  We obtained 2 excellent bone marrow aspirates with the aspirate needle.  We then used a Jamshidi biopsy needle and obtained an excellent bone marrow biopsy core.  The procedure site was cleaned and dressed in sterile fashion.  There are no complications with the procedure.  [name] tolerated the procedure well.    ______________________________ Josph Macho, M.D. PRE/MEDQ  D:  09/09/2013  T:  11/23/2013  Job:  [id number]

## 2013-11-26 ENCOUNTER — Other Ambulatory Visit: Payer: Self-pay | Admitting: Internal Medicine

## 2013-12-02 ENCOUNTER — Other Ambulatory Visit: Payer: Self-pay | Admitting: Internal Medicine

## 2013-12-23 ENCOUNTER — Other Ambulatory Visit: Payer: Self-pay | Admitting: Nurse Practitioner

## 2013-12-23 DIAGNOSIS — D472 Monoclonal gammopathy: Secondary | ICD-10-CM

## 2013-12-23 DIAGNOSIS — C9000 Multiple myeloma not having achieved remission: Secondary | ICD-10-CM

## 2013-12-24 ENCOUNTER — Ambulatory Visit (HOSPITAL_BASED_OUTPATIENT_CLINIC_OR_DEPARTMENT_OTHER): Payer: Medicare Other | Admitting: Hematology & Oncology

## 2013-12-24 ENCOUNTER — Other Ambulatory Visit (HOSPITAL_BASED_OUTPATIENT_CLINIC_OR_DEPARTMENT_OTHER): Payer: Medicare Other | Admitting: Lab

## 2013-12-24 ENCOUNTER — Encounter: Payer: Self-pay | Admitting: *Deleted

## 2013-12-24 VITALS — BP 155/92 | HR 61 | Temp 97.5°F | Resp 18 | Ht 74.0 in | Wt 224.0 lb

## 2013-12-24 DIAGNOSIS — C9000 Multiple myeloma not having achieved remission: Secondary | ICD-10-CM

## 2013-12-24 DIAGNOSIS — D509 Iron deficiency anemia, unspecified: Secondary | ICD-10-CM

## 2013-12-24 DIAGNOSIS — D472 Monoclonal gammopathy: Secondary | ICD-10-CM

## 2013-12-24 DIAGNOSIS — D649 Anemia, unspecified: Secondary | ICD-10-CM

## 2013-12-24 LAB — IRON AND TIBC CHCC
%SAT: 28 % (ref 20–55)
Iron: 104 ug/dL (ref 42–163)
TIBC: 373 ug/dL (ref 202–409)
UIBC: 269 ug/dL (ref 117–376)

## 2013-12-24 LAB — CBC WITH DIFFERENTIAL (CANCER CENTER ONLY)
BASO#: 0 10*3/uL (ref 0.0–0.2)
BASO%: 0.3 % (ref 0.0–2.0)
EOS%: 5.4 % (ref 0.0–7.0)
Eosinophils Absolute: 0.3 10*3/uL (ref 0.0–0.5)
HEMATOCRIT: 37.1 % — AB (ref 38.7–49.9)
HGB: 12.3 g/dL — ABNORMAL LOW (ref 13.0–17.1)
LYMPH#: 2.3 10*3/uL (ref 0.9–3.3)
LYMPH%: 35.9 % (ref 14.0–48.0)
MCH: 28.8 pg (ref 28.0–33.4)
MCHC: 33.2 g/dL (ref 32.0–35.9)
MCV: 87 fL (ref 82–98)
MONO#: 0.5 10*3/uL (ref 0.1–0.9)
MONO%: 8.5 % (ref 0.0–13.0)
NEUT%: 49.9 % (ref 40.0–80.0)
NEUTROS ABS: 3.1 10*3/uL (ref 1.5–6.5)
Platelets: 248 10*3/uL (ref 145–400)
RBC: 4.27 10*6/uL (ref 4.20–5.70)
RDW: 13.3 % (ref 11.1–15.7)
WBC: 6.3 10*3/uL (ref 4.0–10.0)

## 2013-12-24 LAB — FERRITIN CHCC: Ferritin: 42 ng/ml (ref 22–316)

## 2013-12-24 NOTE — Progress Notes (Signed)
Called patient to let him know that he left the office without his 24 hour urine jug.  Left message to have him come and pick it up

## 2013-12-24 NOTE — Progress Notes (Signed)
This office note has been dictated.

## 2013-12-25 ENCOUNTER — Telehealth: Payer: Self-pay | Admitting: *Deleted

## 2013-12-25 LAB — BETA 2 MICROGLOBULIN, SERUM: BETA 2 MICROGLOBULIN: 1.72 mg/L (ref 1.01–1.73)

## 2013-12-25 NOTE — Telephone Encounter (Addendum)
Message copied by Orlando Penner on Thu Dec 25, 2013 12:18 PM ------      Message from: Burney Gauze R      Created: Wed Dec 24, 2013  5:53 PM       Call - iron is ok!!  Laurey Arrow ------This message left on pt's home answering machine.

## 2013-12-25 NOTE — Progress Notes (Signed)
CC:   Michael Wiggins, M.D.  DIAGNOSES: 1. IgG kappa smoldering myeloma. 2. Iron-deficiency anemia.  CURRENT THERAPY:  Observation.  INTERIM HISTORY:  [name] comes in for followup.  He is doing pretty well.  We last saw him back in August.  We did do a bone marrow test on him in September.  The bone marrow report ([id number]) showed variably cell marrow with 14% plasma cells. There were no sheets.  Plasma cells were slightly atypical.  There were some small clusters.  He did have an decreased iron stores.  He had been on Revlimid.  I thought we could probably stop the Revlimid. His bone marrow biopsy back in the springtime showed 13% plasma cells.  He did have a bone survey done back in April of past year.  There was no suspicious abnormalities with lytic lesion in his bones.  When we saw him in August, his monoclonal spike was 0.37 g/dL.  Kappa light chain was 2.45 mg/dL.  IgG level was 1080.  Again, he feels pretty well right now.  His diabetes is under poor control.  He says his hemoglobin A1c was 8.1.  I talked to him at length about this.  I told him that his blood sugars will be much more problem than myeloma ever if he does get the hemoglobin A1c down below 7.  He is taking iron pills.  I told him that he can stop taking iron pills if he wanted to.  He has had no problem with fever.  He has had no change in bowel or bladder habits.  PHYSICAL EXAMINATION:  General:  This is a well-developed, well- nourished African American gentleman in no obvious distress.  Vital Signs:  Temperature of 97.5, pulse 61, respiratory rate 18, blood pressure 155/92, weight is 224 pounds.  Head and Neck:  Normocephalic, atraumatic skull.  There are no ocular or oral lesions.  There are no palpable cervical or supraclavicular lymph nodes.  Lungs:  Clear bilaterally.  Cardiac:  Regular rate and rhythm with normal S1 and S2. There are no murmurs, rubs, or bruits.  Abdomen:  Soft.  He has  good bowel sounds.  There is no fluid wave.  There is no palpable abdominal mass.  There is no palpable hepatosplenomegaly.  Back:  No tenderness over the spine, ribs, or hips.  Extremities:  No clubbing, cyanosis, or edema.  He has good strength in his arms and legs.  Skin:  No rashes, ecchymosis, or petechia.  LABORATORY STUDIES:  White cell count is 6.3, hemoglobin 12.3, hematocrit 37.1, platelet count 248.  MCV is 87.  Ferritin is 42 with an iron saturation of 28%.  IMPRESSION:  [name] is a 68 year old gentleman with IgG kappa smoldering myeloma.  He is doing well right now.  He is asymptomatic. He was on Revlimid.  We stopped the Revlimid for now.  We will plan to get him back in about 3 more months.  I think this would be appropriate for him.  I do want to get a 24-hour urine test on him when we see him back.  I do not see that we need any additional studies on him right now.  Again, I do not see anything that would suggest actual myeloma.    ______________________________ Volanda Napoleon, M.D. PRE/MEDQ  D:  12/24/2013  T:  12/25/2013  Job:  [id number]

## 2013-12-31 ENCOUNTER — Other Ambulatory Visit: Payer: Medicare Other | Admitting: Lab

## 2014-01-02 LAB — UIFE/LIGHT CHAINS/TP QN, 24-HR UR
ALPHA 1 UR: DETECTED — AB
Albumin, U: DETECTED
Beta, Urine: DETECTED — AB
FREE KAPPA LT CHAINS, UR: 4.01 mg/dL — AB (ref 0.14–2.42)
Free Kappa/Lambda Ratio: 6.17 ratio (ref 2.04–10.37)
Free Lambda Excretion/Day: 24.05 mg/d
Free Lambda Lt Chains,Ur: 0.65 mg/dL (ref 0.02–0.67)
Free Lt Chn Excr Rate: 148.37 mg/d
Time: 24 hours
Total Protein, Urine-Ur/day: 1702 mg/d — ABNORMAL HIGH (ref 10–140)
Total Protein, Urine: 46 mg/dL
Volume, Urine: 3700 mL

## 2014-01-05 ENCOUNTER — Telehealth: Payer: Self-pay

## 2014-01-05 ENCOUNTER — Other Ambulatory Visit: Payer: Self-pay | Admitting: Internal Medicine

## 2014-01-05 NOTE — Telephone Encounter (Addendum)
Message copied by Jennell Corner on Mon Jan 05, 2014  2:02 PM ------      Message from: Burney Gauze R      Created: Sun Jan 04, 2014  7:54 PM       Call - urine dosen't show any myeloma protein.  Laurey Arrow ------Left message for Mr. Rosanna Randy voice mail regarding her lab result.

## 2014-01-20 ENCOUNTER — Ambulatory Visit (INDEPENDENT_AMBULATORY_CARE_PROVIDER_SITE_OTHER): Payer: Medicare Other | Admitting: Internal Medicine

## 2014-01-20 VITALS — BP 145/90 | HR 64 | Temp 97.7°F | Resp 18 | Wt 220.0 lb

## 2014-01-20 DIAGNOSIS — C9002 Multiple myeloma in relapse: Secondary | ICD-10-CM

## 2014-01-20 DIAGNOSIS — E785 Hyperlipidemia, unspecified: Secondary | ICD-10-CM

## 2014-01-20 DIAGNOSIS — E119 Type 2 diabetes mellitus without complications: Secondary | ICD-10-CM

## 2014-01-20 DIAGNOSIS — I10 Essential (primary) hypertension: Secondary | ICD-10-CM

## 2014-01-20 LAB — COMPREHENSIVE METABOLIC PANEL
ALT: 14 U/L (ref 0–53)
AST: 13 U/L (ref 0–37)
Albumin: 4 g/dL (ref 3.5–5.2)
Alkaline Phosphatase: 53 U/L (ref 39–117)
BILIRUBIN TOTAL: 0.4 mg/dL (ref 0.2–1.2)
BUN: 15 mg/dL (ref 6–23)
CALCIUM: 10.2 mg/dL (ref 8.4–10.5)
CHLORIDE: 99 meq/L (ref 96–112)
CO2: 28 meq/L (ref 19–32)
Creat: 0.89 mg/dL (ref 0.50–1.35)
Glucose, Bld: 181 mg/dL — ABNORMAL HIGH (ref 70–99)
Potassium: 4.2 mEq/L (ref 3.5–5.3)
SODIUM: 137 meq/L (ref 135–145)
Total Protein: 7 g/dL (ref 6.0–8.3)

## 2014-01-20 LAB — TSH: TSH: 1.486 u[IU]/mL (ref 0.350–4.500)

## 2014-01-20 LAB — GLUCOSE, POCT (MANUAL RESULT ENTRY): POC Glucose: 175 mg/dl — AB (ref 70–99)

## 2014-01-20 LAB — PSA, MEDICARE: PSA: 2.88 ng/mL (ref <=4.00)

## 2014-01-20 LAB — LIPID PANEL
CHOL/HDL RATIO: 6.3 ratio
Cholesterol: 222 mg/dL — ABNORMAL HIGH (ref 0–200)
HDL: 35 mg/dL — AB (ref >39)
LDL CALC: 155 mg/dL — AB (ref 0–99)
Triglycerides: 160 mg/dL — ABNORMAL HIGH (ref <150)
VLDL: 32 mg/dL (ref 0–40)

## 2014-01-20 LAB — POCT GLYCOSYLATED HEMOGLOBIN (HGB A1C): HEMOGLOBIN A1C: 7.8

## 2014-01-20 MED ORDER — GLIPIZIDE ER 5 MG PO TB24: 5.0000 mg | ORAL_TABLET | Freq: Two times a day (BID) | ORAL | Status: DC

## 2014-01-20 MED ORDER — METOPROLOL TARTRATE 25 MG PO TABS: 25.0000 mg | ORAL_TABLET | Freq: Two times a day (BID) | ORAL | Status: DC

## 2014-01-20 MED ORDER — OLMESARTAN MEDOXOMIL-HCTZ 40-12.5 MG PO TABS: 1.0000 | ORAL_TABLET | Freq: Every morning | ORAL | Status: DC

## 2014-01-20 MED ORDER — METFORMIN HCL 1000 MG PO TABS: 1000.0000 mg | ORAL_TABLET | Freq: Two times a day (BID) | ORAL | Status: DC

## 2014-01-20 MED ORDER — ATORVASTATIN CALCIUM 10 MG PO TABS: 10.0000 mg | ORAL_TABLET | Freq: Every day | ORAL | Status: DC

## 2014-01-20 NOTE — Progress Notes (Signed)
   Subjective:    Patient ID: Michael Shon Sr., male    DOB: [birth year], 68 y.o.   MRN: [id number]  HPI Here for rfs of meds. Dr. Marin Olp has his multiple myeloma controlled. His NIDDM has been slipping, last A1c 7.9 HTN usually controlled but he did not take his meds today! Will cover with statin, start lipitor $RemoveBefor'10mg'QxBiCsqVQHVP$  today. He feels good with no side affects.   Review of Systems     Objective:   Physical Exam  Constitutional: He is oriented to person, place, and time. He appears well-developed and well-nourished. No distress.  HENT:  Head: Normocephalic.  Eyes: EOM are normal. Pupils are equal, round, and reactive to light.  Neck: Normal range of motion. Neck supple.  Cardiovascular: Normal rate, regular rhythm, normal heart sounds and intact distal pulses.   Pulmonary/Chest: Effort normal.  Musculoskeletal: Normal range of motion. He exhibits no edema.  Neurological: He is alert and oriented to person, place, and time. No cranial nerve deficit or sensory deficit. He exhibits normal muscle tone. Coordination normal.  Skin: No rash noted.  Psychiatric: He has a normal mood and affect. His behavior is normal.   Results for orders placed in visit on 01/20/14  GLUCOSE, POCT (MANUAL RESULT ENTRY)      Result Value Range   POC Glucose 175 (*) 70 - 99 mg/dl  POCT GLYCOSYLATED HEMOGLOBIN (HGB A1C)      Result Value Range   Hemoglobin A1C 7.8            Assessment & Plan:  NIDDM/Hyperlipidemia/HTN/MM RF all meds F/up 3 mo

## 2014-01-20 NOTE — Patient Instructions (Signed)
Diabetes Meal Planning Guide The diabetes meal planning guide is a tool to help you plan your meals and snacks. It is important for people with diabetes to manage their blood glucose (sugar) levels. Choosing the right foods and the right amounts throughout your day will help control your blood glucose. Eating right can even help you improve your blood pressure and reach or maintain a healthy weight. CARBOHYDRATE COUNTING MADE EASY When you eat carbohydrates, they turn to sugar. This raises your blood glucose level. Counting carbohydrates can help you control this level so you feel better. When you plan your meals by counting carbohydrates, you can have more flexibility in what you eat and balance your medicine with your food intake. Carbohydrate counting simply means adding up the total amount of carbohydrate grams in your meals and snacks. Try to eat about the same amount at each meal. Foods with carbohydrates are listed below. Each portion below is 1 carbohydrate serving or 15 grams of carbohydrates. Ask your dietician how many grams of carbohydrates you should eat at each meal or snack. Grains and Starches  1 slice bread.   English muffin or hotdog/hamburger bun.   cup cold cereal (unsweetened).   cup cooked pasta or rice.   cup starchy vegetables (corn, potatoes, peas, beans, winter squash).  1 tortilla (6 inches).   bagel.  1 waffle or pancake (size of a CD).   cup cooked cereal.  4 to 6 small crackers. *Whole grain is recommended. Fruit  1 cup fresh unsweetened berries, melon, papaya, pineapple.  1 small fresh fruit.   banana or mango.   cup fruit juice (4 oz unsweetened).   cup canned fruit in natural juice or water.  2 tbs dried fruit.  12 to 15 grapes or cherries. Milk and Yogurt  1 cup fat-free or 1% milk.  1 cup soy milk.  6 oz light yogurt with sugar-free sweetener.  6 oz low-fat soy yogurt.  6 oz plain yogurt. Vegetables  1 cup raw or  cup  cooked is counted as 0 carbohydrates or a "free" food.  If you eat 3 or more servings at 1 meal, count them as 1 carbohydrate serving. Other Carbohydrates   oz chips or pretzels.   cup ice cream or frozen yogurt.   cup sherbet or sorbet.  2 inch square cake, no frosting.  1 tbs honey, sugar, jam, jelly, or syrup.  2 small cookies.  3 squares of graham crackers.  3 cups popcorn.  6 crackers.  1 cup broth-based soup.  Count 1 cup casserole or other mixed foods as 2 carbohydrate servings.  Foods with less than 20 calories in a serving may be counted as 0 carbohydrates or a "free" food. You may want to purchase a book or computer software that lists the carbohydrate gram counts of different foods. In addition, the nutrition facts panel on the labels of the foods you eat are a good source of this information. The label will tell you how big the serving size is and the total number of carbohydrate grams you will be eating per serving. Divide this number by 15 to obtain the number of carbohydrate servings in a portion. Remember, 1 carbohydrate serving equals 15 grams of carbohydrate. SERVING SIZES Measuring foods and serving sizes helps you make sure you are getting the right amount of food. The list below tells how big or small some common serving sizes are.  1 oz.........4 stacked dice.  3 oz.........Deck of cards.  1 tsp........Tip   of little finger.  1 tbs......Marland KitchenMarland KitchenThumb.  2 tbs.......Marland KitchenGolf ball.   cup......Marland KitchenHalf of a fist.  1 cup.......Marland KitchenA fist. SAMPLE DIABETES MEAL PLAN Below is a sample meal plan that includes foods from the grain and starches, dairy, vegetable, fruit, and meat groups. A dietician can individualize a meal plan to fit your calorie needs and tell you the number of servings needed from each food group. However, controlling the total amount of carbohydrates in your meal or snack is more important than making sure you include all of the food groups at every  meal. You may interchange carbohydrate containing foods (dairy, starches, and fruits). The meal plan below is an example of a 2000 calorie diet using carbohydrate counting. This meal plan has 17 carbohydrate servings. Breakfast  1 cup oatmeal (2 carb servings).   cup light yogurt (1 carb serving).  1 cup blueberries (1 carb serving).   cup almonds. Snack  1 large apple (2 carb servings).  1 low-fat string cheese stick. Lunch  Chicken breast salad.  1 cup spinach.   cup chopped tomatoes.  2 oz chicken breast, sliced.  2 tbs low-fat [place] dressing.  12 whole-wheat crackers (2 carb servings).  12 to 15 grapes (1 carb serving).  1 cup low-fat milk (1 carb serving). Snack  1 cup carrots.   cup hummus (1 carb serving). Dinner  3 oz broiled salmon.  1 cup brown rice (3 carb servings). Snack  1  cups steamed broccoli (1 carb serving) drizzled with 1 tsp olive oil and lemon juice.  1 cup light pudding (2 carb servings). DIABETES MEAL PLANNING WORKSHEET Your dietician can use this worksheet to help you decide how many servings of foods and what types of foods are right for you.  BREAKFAST Food Group and Servings / Carb Servings Grain/Starches __________________________________ Dairy __________________________________________ Vegetable ______________________________________ Fruit ___________________________________________ Meat __________________________________________ Fat ____________________________________________ LUNCH Food Group and Servings / Carb Servings Grain/Starches ___________________________________ Dairy ___________________________________________ Fruit ____________________________________________ Meat ___________________________________________ Fat _____________________________________________ Michael Wiggins Food Group and Servings / Carb Servings Grain/Starches ___________________________________ Dairy  ___________________________________________ Fruit ____________________________________________ Meat ___________________________________________ Fat _____________________________________________ SNACKS Food Group and Servings / Carb Servings Grain/Starches ___________________________________ Dairy ___________________________________________ Vegetable _______________________________________ Fruit ____________________________________________ Meat ___________________________________________ Fat _____________________________________________ DAILY TOTALS Starches _________________________ Vegetable ________________________ Fruit ____________________________ Dairy ____________________________ Meat ____________________________ Fat ______________________________ Document Released: 08/31/2005 Document Revised: 02/26/2012 Document Reviewed: 07/12/2009 [employer] Patient Information 2014 [employer]. Diabetes and Foot Care Diabetes may cause you to have problems because of poor blood supply (circulation) to your feet and legs. This may cause the skin on your feet to become thinner, break easier, and heal more slowly. Your skin may become dry, and the skin may peel and crack. You may also have nerve damage in your legs and feet causing decreased feeling in them. You may not notice minor injuries to your feet that could lead to infections or more serious problems. Taking care of your feet is one of the most important things you can do for yourself.  HOME CARE INSTRUCTIONS  Wear shoes at all times, even in the house. Do not go barefoot. Bare feet are easily injured.  Check your feet daily for blisters, cuts, and redness. If you cannot see the bottom of your feet, use a mirror or ask someone for help.  Wash your feet with warm water (do not use hot water) and mild soap. Then pat your feet and the areas between your toes until they are completely dry. Do not soak your feet as this can dry your  skin.  Apply a moisturizing lotion or petroleum jelly (that does not contain alcohol and is unscented) to the skin on your feet and to dry, brittle toenails. Do not apply lotion between your toes.  Trim your toenails straight across. Do not dig under them or around the cuticle. File the edges of your nails with an emery board or nail file.  Do not cut corns or calluses or try to remove them with medicine.  Wear clean socks or stockings every day. Make sure they are not too tight. Do not wear knee-high stockings since they may decrease blood flow to your legs.  Wear shoes that fit properly and have enough cushioning. To break in new shoes, wear them for just a few hours a day. This prevents you from injuring your feet. Always look in your shoes before you put them on to be sure there are no objects inside.  Do not cross your legs. This may decrease the blood flow to your feet.  If you find a minor scrape, cut, or break in the skin on your feet, keep it and the skin around it clean and dry. These areas may be cleansed with mild soap and water. Do not cleanse the area with peroxide, alcohol, or iodine.  When you remove an adhesive bandage, be sure not to damage the skin around it.  If you have a wound, look at it several times a day to make sure it is healing.  Do not use heating pads or hot water bottles. They may burn your skin. If you have lost feeling in your feet or legs, you may not know it is happening until it is too late.  Make sure your health care provider performs a complete foot exam at least annually or more often if you have foot problems. Report any cuts, sores, or bruises to your health care provider immediately. SEEK MEDICAL CARE IF:   You have an injury that is not healing.  You have cuts or breaks in the skin.  You have an ingrown nail.  You notice redness on your legs or feet.  You feel burning or tingling in your legs or feet.  You have pain or cramps in your  legs and feet.  Your legs or feet are numb.  Your feet always feel cold. SEEK IMMEDIATE MEDICAL CARE IF:   There is increasing redness, swelling, or pain in or around a wound.  There is a red line that goes up your leg.  Pus is coming from a wound.  You develop a fever or as directed by your health care provider.  You notice a bad smell coming from an ulcer or wound. Document Released: 12/01/2000 Document Revised: 08/06/2013 Document Reviewed: 05/13/2013 [facility] Patient Information 2014 [place]. Diabetes and Exercise Exercising regularly is important. It is not just about losing weight. It has many health benefits, such as:  Improving your overall fitness, flexibility, and endurance.  Increasing your bone density.  Helping with weight control.  Decreasing your body fat.  Increasing your muscle strength.  Reducing stress and tension.  Improving your overall health. People with diabetes who exercise gain additional benefits because exercise:  Reduces appetite.  Improves the body's use of blood sugar (glucose).  Helps lower or control blood glucose.  Decreases blood pressure.  Helps control blood lipids (such as cholesterol and triglycerides).  Improves the body's use of the hormone insulin by:  Increasing the body's insulin sensitivity.  Reducing the body's insulin needs.  Decreases  the risk for heart disease because exercising:  Lowers cholesterol and triglycerides levels.  Increases the levels of good cholesterol (such as high-density lipoproteins [HDL]) in the body.  Lowers blood glucose levels. YOUR ACTIVITY PLAN  Choose an activity that you enjoy and set realistic goals. Your health care provider or diabetes educator can help you make an activity plan that works for you. You can break activities into 2 or 3 sessions throughout the day. Doing so is as good as one long session. Exercise ideas include:  Taking the dog for a walk.  Taking the  stairs instead of the elevator.  Dancing to your favorite song.  Doing your favorite exercise with a friend. RECOMMENDATIONS FOR EXERCISING WITH TYPE 1 OR TYPE 2 DIABETES   Check your blood glucose before exercising. If blood glucose levels are greater than 240 mg/dL, check for urine ketones. Do not exercise if ketones are present.  Avoid injecting insulin into areas of the body that are going to be exercised. For example, avoid injecting insulin into:  The arms when playing tennis.  The legs when jogging.  Keep a record of:  Food intake before and after you exercise.  Expected peak times of insulin action.  Blood glucose levels before and after you exercise.  The type and amount of exercise you have done.  Review your records with your health care provider. Your health care provider will help you to develop guidelines for adjusting food intake and insulin amounts before and after exercising.  If you take insulin or oral hypoglycemic agents, watch for signs and symptoms of hypoglycemia. They include:  Dizziness.  Shaking.  Sweating.  Chills.  Confusion.  Drink plenty of water while you exercise to prevent dehydration or heat stroke. Body water is lost during exercise and must be replaced.  Talk to your health care provider before starting an exercise program to make sure it is safe for you. Remember, almost any type of activity is better than none. Document Released: 02/24/2004 Document Revised: 08/06/2013 Document Reviewed: 05/13/2013 [facility] Patient Information 2014 [place].

## 2014-01-21 ENCOUNTER — Encounter: Payer: Self-pay | Admitting: *Deleted

## 2014-01-29 ENCOUNTER — Telehealth: Payer: Self-pay

## 2014-01-29 NOTE — Telephone Encounter (Signed)
Patient wants a glucose monitor  [phone]

## 2014-01-30 NOTE — Telephone Encounter (Signed)
Ok to send in a script for a glucose monitor?

## 2014-01-30 NOTE — Telephone Encounter (Signed)
Ok to send rx for glucometer

## 2014-01-31 MED ORDER — BLOOD GLUCOSE METER KIT: PACK | Status: DC

## 2014-01-31 MED ORDER — BLOOD GLUCOSE TEST VI STRP: ORAL_STRIP | Status: DC

## 2014-01-31 MED ORDER — LANCETS MISC: Status: DC

## 2014-01-31 NOTE — Telephone Encounter (Signed)
Pt notified and rx sent in

## 2014-03-25 ENCOUNTER — Ambulatory Visit (HOSPITAL_BASED_OUTPATIENT_CLINIC_OR_DEPARTMENT_OTHER): Payer: Medicare Other | Admitting: Lab

## 2014-03-25 ENCOUNTER — Encounter: Payer: Self-pay | Admitting: Hematology & Oncology

## 2014-03-25 ENCOUNTER — Ambulatory Visit (HOSPITAL_BASED_OUTPATIENT_CLINIC_OR_DEPARTMENT_OTHER): Payer: Medicare Other | Admitting: Hematology & Oncology

## 2014-03-25 VITALS — BP 180/93 | HR 93 | Temp 97.6°F | Resp 18 | Ht 74.0 in | Wt 221.0 lb

## 2014-03-25 DIAGNOSIS — D509 Iron deficiency anemia, unspecified: Secondary | ICD-10-CM

## 2014-03-25 DIAGNOSIS — C9000 Multiple myeloma not having achieved remission: Secondary | ICD-10-CM

## 2014-03-25 DIAGNOSIS — D472 Monoclonal gammopathy: Secondary | ICD-10-CM

## 2014-03-25 LAB — CBC WITH DIFFERENTIAL (CANCER CENTER ONLY)
BASO#: 0 10*3/uL (ref 0.0–0.2)
BASO%: 0.7 % (ref 0.0–2.0)
EOS%: 5.4 % (ref 0.0–7.0)
Eosinophils Absolute: 0.3 10*3/uL (ref 0.0–0.5)
HEMATOCRIT: 37.9 % — AB (ref 38.7–49.9)
HGB: 12.8 g/dL — ABNORMAL LOW (ref 13.0–17.1)
LYMPH#: 2 10*3/uL (ref 0.9–3.3)
LYMPH%: 35.5 % (ref 14.0–48.0)
MCH: 29 pg (ref 28.0–33.4)
MCHC: 33.8 g/dL (ref 32.0–35.9)
MCV: 86 fL (ref 82–98)
MONO#: 0.5 10*3/uL (ref 0.1–0.9)
MONO%: 9.4 % (ref 0.0–13.0)
NEUT#: 2.8 10*3/uL (ref 1.5–6.5)
NEUT%: 49 % (ref 40.0–80.0)
Platelets: 271 10*3/uL (ref 145–400)
RBC: 4.42 10*6/uL (ref 4.20–5.70)
RDW: 14.2 % (ref 11.1–15.7)
WBC: 5.7 10*3/uL (ref 4.0–10.0)

## 2014-03-25 LAB — CHCC SATELLITE - SMEAR

## 2014-03-25 NOTE — Progress Notes (Signed)
Hematology and Oncology Follow Up Visit  Rayon Mcchristian Sr. [id number] 1946/10/02 68 y.o. 03/25/2014   Principle Diagnosis:   IgG kappa smoldering myeloma  Iron deficiency anemia  Current Therapy:    Observation     Interim History:  Mr.  Rosanna Randy is back for followup. Last him back in January. Since then, he's been doing pretty well.  Assessment patient with his blood sugars. Apparently, his blood sugars drop quite a bit in the afternoon.  He's also having some blood pressure problems. He's not on blood pressure medicine for a month. This was too expensive for him. He says he now can get another refill.  We saw him back in January, his I. she has urine was 148 mg per day.  We checked his iron studies back in January, his ferritin was 42 with an iron saturation of 20%.  He's working out. He is into exercise. Fatigue has not been much of a problem.  Medications: Current outpatient prescriptions:Blood Glucose Monitoring Suppl (BLOOD GLUCOSE METER) kit, Use as instructed, Disp: 1 each, Rfl: 0;  Ginkgo Biloba 30 MG CAPS, Take by mouth every other day., Disp: , Rfl: ;  glipiZIDE (GLUCOTROL XL) 5 MG 24 hr tablet, Take 1 tablet (5 mg total) by mouth 2 (two) times daily at 8 am and 10 pm., Disp: 180 tablet, Rfl: 3 Glucosamine-Chondroitin-MSM (TRIPLE FLEX PO), Take 1 tablet by mouth 2 (two) times daily. , Disp: , Rfl: ;  Glucose Blood (BLOOD GLUCOSE TEST STRIPS) STRP, Use as instructed twice a day, Disp: 50 each, Rfl: 2;  Lancets MISC, Use as instructed twice a day, Disp: 100 each, Rfl: 2;  metFORMIN (GLUCOPHAGE) 1000 MG tablet, Take 1 tablet (1,000 mg total) by mouth 2 (two) times daily with a meal., Disp: 180 tablet, Rfl: 3 metoprolol tartrate (LOPRESSOR) 25 MG tablet, Take 1 tablet (25 mg total) by mouth 2 (two) times daily. PATIENT NEEDS OFFICE VISIT FOR ADDITIONAL REFILLS - 2nd NOTICE, Disp: 180 tablet, Rfl: 3;  Multiple Vitamin (MULTIVITAMIN WITH MINERALS) TABS, Take 1 tablet by mouth every  morning., Disp: , Rfl: ;  atorvastatin (LIPITOR) 10 MG tablet, Take 1 tablet (10 mg total) by mouth daily., Disp: 90 tablet, Rfl: 3 olmesartan-hydrochlorothiazide (BENICAR HCT) 40-12.5 MG per tablet, Take 1 tablet by mouth every morning., Disp: 90 tablet, Rfl: 3  Allergies:  Allergies  Allergen Reactions  . Amlodipine Swelling  . Crestor [Rosuvastatin] Other (See Comments)    Per pt unable to focus    Past Medical History, Surgical history, Social history, and Family History were reviewed and updated.  Review of Systems: As above  Physical Exam:  height is _0  (1.88 m) and weight is 221 lb (100.245 kg). His oral temperature is 97.6 F (36.4 C). His blood pressure is 180/93 and his pulse is 93. His respiration is 18.   Well-developed well-nourished Afro-American gentleman. Lungs are clear. Cardiac exam regular in rhythm with no murmurs rubs or bruits. Head and neck exam shows no sclera icterus. He has no adenopathy in the neck. Abdomen is soft. Has good bowel sounds. There is no fluid wave. There is no palpable liver or spleen tip. Extremities shows no clubbing cyanosis or edema. Neurological exam shows no focal neurological deficits. Skin exam no rashes.  Lab Results  Component Value Date   WBC 5.7 03/25/2014   HGB 12.8* 03/25/2014   HCT 37.9* 03/25/2014   MCV 86 03/25/2014   PLT 271 03/25/2014     Chemistry  Component Value Date/Time   NA 137 01/20/2014 1006   NA 138 08/15/2013 1020   NA 135* 07/18/2013 0907   K 4.2 01/20/2014 1006   K 4.2 08/15/2013 1020   K 3.8 07/18/2013 0907   CL 99 01/20/2014 1006   CL 102 08/15/2013 1020   CL 108* 05/15/2013 0912   CO2 28 01/20/2014 1006   CO2 28 08/15/2013 1020   CO2 23 07/18/2013 0907   BUN 15 01/20/2014 1006   BUN 11 08/15/2013 1020   BUN 22.3 07/18/2013 0907   CREATININE 0.89 01/20/2014 1006   CREATININE 1.1 07/18/2013 0907   CREATININE 0.94 07/14/2013 1645      Component Value Date/Time   CALCIUM 10.2 01/20/2014 1006   CALCIUM 9.5 08/15/2013 1020    CALCIUM 9.0 07/18/2013 0907   ALKPHOS 53 01/20/2014 1006   ALKPHOS 58 08/15/2013 1020   ALKPHOS 62 07/18/2013 0907   AST 13 01/20/2014 1006   AST 21 08/15/2013 1020   AST 10 07/18/2013 0907   ALT 14 01/20/2014 1006   ALT 16 08/15/2013 1020   ALT 14 07/18/2013 0907   BILITOT 0.4 01/20/2014 1006   BILITOT 0.70 08/15/2013 1020   BILITOT 0.23 07/18/2013 2706         Impression and Plan: Mr. Rosanna Randy is 68 year old gentleman with IgG kappa smoldering myeloma. So far, I do not see any evidence of progressive disease.  A bone marrow test noted on him back in September showed 40% plasma cells.  We're still watch him closely.  We will plan to get him back in another 3 months. We will check his blood and urine.  I don't see a need for any x-ray studies right now.  Spent a good half hour with him. He comes in with his girlfriend. I answered all of their questions. I reviewed his blood smear. This looked pretty much bland.   Volanda Napoleon, MD 4/8/20159:04 AM

## 2014-03-26 LAB — IRON AND TIBC CHCC
%SAT: 21 % (ref 20–55)
Iron: 74 ug/dL (ref 42–163)
TIBC: 361 ug/dL (ref 202–409)
UIBC: 287 ug/dL (ref 117–376)

## 2014-03-26 LAB — FERRITIN CHCC: Ferritin: 29 ng/ml (ref 22–316)

## 2014-03-27 LAB — PROTEIN ELECTROPHORESIS, SERUM, WITH REFLEX
Albumin ELP: 51.9 % — ABNORMAL LOW (ref 55.8–66.1)
Alpha-1-Globulin: 7.2 % — ABNORMAL HIGH (ref 2.9–4.9)
Alpha-2-Globulin: 11.6 % (ref 7.1–11.8)
BETA 2: 4.6 % (ref 3.2–6.5)
Beta Globulin: 7.9 % — ABNORMAL HIGH (ref 4.7–7.2)
GAMMA GLOBULIN: 16.8 % (ref 11.1–18.8)
M-Spike, %: 0.43 g/dL
Total Protein, Serum Electrophoresis: 6.9 g/dL (ref 6.0–8.3)

## 2014-03-27 LAB — COMPREHENSIVE METABOLIC PANEL
ALT: 14 U/L (ref 0–53)
AST: 15 U/L (ref 0–37)
Albumin: 3.9 g/dL (ref 3.5–5.2)
Alkaline Phosphatase: 44 U/L (ref 39–117)
BILIRUBIN TOTAL: 0.4 mg/dL (ref 0.2–1.2)
BUN: 14 mg/dL (ref 6–23)
CALCIUM: 9.7 mg/dL (ref 8.4–10.5)
CHLORIDE: 101 meq/L (ref 96–112)
CO2: 22 meq/L (ref 19–32)
CREATININE: 0.88 mg/dL (ref 0.50–1.35)
GLUCOSE: 156 mg/dL — AB (ref 70–99)
Potassium: 4.2 mEq/L (ref 3.5–5.3)
Sodium: 136 mEq/L (ref 135–145)
Total Protein: 6.9 g/dL (ref 6.0–8.3)

## 2014-03-27 LAB — KAPPA/LAMBDA LIGHT CHAINS
KAPPA FREE LGHT CHN: 2.49 mg/dL — AB (ref 0.33–1.94)
Kappa:Lambda Ratio: 1.2 (ref 0.26–1.65)
LAMBDA FREE LGHT CHN: 2.08 mg/dL (ref 0.57–2.63)

## 2014-03-27 LAB — IGG, IGA, IGM
IGA: 388 mg/dL — AB (ref 68–379)
IGG (IMMUNOGLOBIN G), SERUM: 1220 mg/dL (ref 650–1600)
IGM, SERUM: 24 mg/dL — AB (ref 41–251)

## 2014-03-27 LAB — IFE INTERPRETATION: Immunofix Electr Int: 0

## 2014-03-30 ENCOUNTER — Telehealth: Payer: Self-pay | Admitting: Hematology & Oncology

## 2014-03-30 ENCOUNTER — Telehealth: Payer: Self-pay | Admitting: Nurse Practitioner

## 2014-03-30 ENCOUNTER — Other Ambulatory Visit: Payer: Self-pay | Admitting: Nurse Practitioner

## 2014-03-30 DIAGNOSIS — D509 Iron deficiency anemia, unspecified: Secondary | ICD-10-CM

## 2014-03-30 DIAGNOSIS — C9000 Multiple myeloma not having achieved remission: Secondary | ICD-10-CM

## 2014-03-30 NOTE — Telephone Encounter (Addendum)
Message copied by Jimmy Footman on Mon Mar 30, 2014 11:19 AM ------      Message from: Burney Gauze R      Created: Thu Mar 26, 2014  6:24 PM       Please call and let him know that the myeloma still looks nice and low. However, his iron is low. He just is not absorbing iron by mouth. This is because of his diabetes. He really needs a dose of IV iron. Please set him up with Feraheme at 1020 mg x1 dose. Set this up in one or 2 weeks. This will make him feel better and have more energy to work out. Thanks. Pete ------LVM for patient to gives [place] a call back to set-up his iron appointments.

## 2014-03-30 NOTE — Telephone Encounter (Signed)
Pt aware of 4-15 iron infusion, Tara aware to precert. Transferred to RN for questions

## 2014-04-01 ENCOUNTER — Ambulatory Visit (HOSPITAL_BASED_OUTPATIENT_CLINIC_OR_DEPARTMENT_OTHER): Payer: Medicare Other

## 2014-04-01 VITALS — BP 171/93 | HR 89 | Temp 97.8°F | Resp 16

## 2014-04-01 DIAGNOSIS — D509 Iron deficiency anemia, unspecified: Secondary | ICD-10-CM

## 2014-04-01 DIAGNOSIS — C9000 Multiple myeloma not having achieved remission: Secondary | ICD-10-CM

## 2014-04-01 MED ORDER — SODIUM CHLORIDE 0.9 % IV SOLN
1020.0000 mg | Freq: Once | INTRAVENOUS | Status: AC
  Administered 2014-04-01: 1020 mg via INTRAVENOUS
  Filled 2014-04-01: qty 34

## 2014-04-01 MED ORDER — SODIUM CHLORIDE 0.9 % IV SOLN
Freq: Once | INTRAVENOUS | Status: AC
  Administered 2014-04-01: 15:00:00 via INTRAVENOUS

## 2014-05-12 ENCOUNTER — Encounter (HOSPITAL_COMMUNITY): Payer: Self-pay

## 2014-06-01 ENCOUNTER — Ambulatory Visit (INDEPENDENT_AMBULATORY_CARE_PROVIDER_SITE_OTHER): Payer: Medicare Other | Admitting: Family Medicine

## 2014-06-01 ENCOUNTER — Encounter: Payer: Self-pay | Admitting: Family Medicine

## 2014-06-01 VITALS — BP 140/92 | HR 59 | Temp 97.6°F | Resp 16 | Ht 73.5 in | Wt 219.0 lb

## 2014-06-01 DIAGNOSIS — E119 Type 2 diabetes mellitus without complications: Secondary | ICD-10-CM

## 2014-06-01 DIAGNOSIS — E785 Hyperlipidemia, unspecified: Secondary | ICD-10-CM

## 2014-06-01 DIAGNOSIS — Z23 Encounter for immunization: Secondary | ICD-10-CM

## 2014-06-01 DIAGNOSIS — Z0000 Encounter for general adult medical examination without abnormal findings: Secondary | ICD-10-CM

## 2014-06-01 LAB — LIPID PANEL
Cholesterol: 211 mg/dL — ABNORMAL HIGH (ref 0–200)
HDL: 33 mg/dL — ABNORMAL LOW (ref >39)
LDL CALC: 152 mg/dL — AB (ref 0–99)
Total CHOL/HDL Ratio: 6.4 Ratio
Triglycerides: 128 mg/dL (ref <150)
VLDL: 26 mg/dL (ref 0–40)

## 2014-06-01 LAB — HEMOGLOBIN A1C
HEMOGLOBIN A1C: 7.8 % — AB (ref <5.7)
Mean Plasma Glucose: 177 mg/dL — ABNORMAL HIGH (ref <117)

## 2014-06-01 LAB — MICROALBUMIN, URINE: Microalb, Ur: 10.3 mg/dL — ABNORMAL HIGH (ref 0.00–1.89)

## 2014-06-01 NOTE — Patient Instructions (Addendum)
I will be in touch with your labs when they come in.  Keep up the good work with exercise and diet.  If your cholesterol continues to run high we can try lipitor or another medication such as pravastatin.     You can get the shingles vaccine at your convenience- however wait a least a month after today's vaccines and double check with Dr. Marin Olp prior to having this vaccine.

## 2014-06-01 NOTE — Progress Notes (Signed)
[facility] [address], [place] [address] [phone]  Date:  06/01/2014   Name:  Michael Danielski Sr.   DOB:  September 14, 1946   MRN:  [id number]  PCP:  Kennon Portela, MD    Chief Complaint: Annual Exam   History of Present Illness:  Michael Plant Sr. is a 68 y.o. very pleasant male patient who presents with the following:  Gentleman with history of DM, HTN, high cholesterol and MGUS/ MM here today for a CPE.  He had labs performed in February; A1c at that time 7.8, PSA looked good.  Then had CMP in April which looked fine.  He is seeing Dr. Marin Olp for his MM which is at this point just being monitored. For DM he is taking metformin and glipizide Lopressor for his HTN- he has not been taking benicar in some time, notes that his BP seems to be ok without this.  He has been rx lipitor for hyperlipidemia - however he is not taking it because it seems to make him "groggy" in the am.  When he does not take it he feels well.  He has really never taken his lipitor.   Next follow-up with Dr. Marin Olp in July.   He has not yet had the zostavax.   His glucose seems to be ok.  He will check in the am- get 130- 180.  In the afternoon he will be 60- 70 generally.     Pneumovax in 2013, td in 2005.  Will need tdap and prevnar today, rx for zostavax He has been out of benicar for about one month due to  Patient Active Problem List   Diagnosis Date Noted  . Encounter for therapeutic drug monitoring 03/13/2013  . Multiple myeloma 02/15/2013  . Edema 01/07/2013  . Finger laceration 08/21/2012  . Diabetes mellitus   . Hyperlipidemia   . Hypertension   . MGUS (monoclonal gammopathy of unknown significance)   . GERD (gastroesophageal reflux disease)     Past Medical History  Diagnosis Date  . Diabetes mellitus   . Hyperlipidemia   . Hypertension   . Osteoarthritis   . MGUS (monoclonal gammopathy of unknown significance)   . GERD (gastroesophageal reflux disease)    . Anemia   . Multiple myeloma 02/2013    normal cytogenetics and FISH panel on 03/11/2013.   . Cataract     Past Surgical History  Procedure Laterality Date  . Eye surgery      History  Substance Use Topics  . Smoking status: Former Smoker -- 2.00 packs/day for 35 years    Types: Cigarettes    Start date: 03/26/1963    Quit date: 12/18/1997  . Smokeless tobacco: Never Used     Comment: quit 15 years  . Alcohol Use: No    No family history on file.  Allergies  Allergen Reactions  . Amlodipine Swelling  . Crestor [Rosuvastatin] Other (See Comments)    Per pt unable to focus    Medication list has been reviewed and updated.  Current Outpatient Prescriptions on File Prior to Visit  Medication Sig Dispense Refill  . Blood Glucose Monitoring Suppl (BLOOD GLUCOSE METER) kit Use as instructed  1 each  0  . Ginkgo Biloba 30 MG CAPS Take by mouth every other day.      Marland Kitchen glipiZIDE (GLUCOTROL XL) 5 MG 24 hr tablet Take 1 tablet (5 mg total) by mouth 2 (two) times daily at 8 am and 10  pm.  180 tablet  3  . Glucose Blood (BLOOD GLUCOSE TEST STRIPS) STRP Use as instructed twice a day  50 each  2  . Lancets MISC Use as instructed twice a day  100 each  2  . metFORMIN (GLUCOPHAGE) 1000 MG tablet Take 1 tablet (1,000 mg total) by mouth 2 (two) times daily with a meal.  180 tablet  3  . metoprolol tartrate (LOPRESSOR) 25 MG tablet Take 1 tablet (25 mg total) by mouth 2 (two) times daily. PATIENT NEEDS OFFICE VISIT FOR ADDITIONAL REFILLS - 2nd NOTICE  180 tablet  3  . Multiple Vitamin (MULTIVITAMIN WITH MINERALS) TABS Take 1 tablet by mouth every morning.      Marland Kitchen atorvastatin (LIPITOR) 10 MG tablet Take 1 tablet (10 mg total) by mouth daily.  90 tablet  3  . Glucosamine-Chondroitin-MSM (TRIPLE FLEX PO) Take 1 tablet by mouth 2 (two) times daily.       Marland Kitchen olmesartan-hydrochlorothiazide (BENICAR HCT) 40-12.5 MG per tablet Take 1 tablet by mouth every morning.  90 tablet  3   No current  facility-administered medications on file prior to visit.    Review of Systems:  As per HPI- otherwise negative.   Physical Examination: Filed Vitals:   06/01/14 0816  BP: 140/92  Pulse: 59  Temp: 97.6 F (36.4 C)  Resp: 16   Filed Vitals:   06/01/14 0816  Height: 6' 1.5" (1.867 m)  Weight: 219 lb (99.338 kg)   Body mass index is 28.5 kg/(m^2). Ideal Body Weight: Weight in (lb) to have BMI = 25: 191.7  GEN: WDWN, NAD, Non-toxic, A & O x 3, mild overweight, looks well HEENT: Atraumatic, Normocephalic. Neck supple. No masses, No LAD.  Bilateral TM wnl, oropharynx normal.  PEERL,EOMI.   Ears and Nose: No external deformity. CV: RRR, No M/G/R. No JVD. No thrill. No extra heart sounds. PULM: CTA B, no wheezes, crackles, rhonchi. No retractions. No resp. distress. No accessory muscle use. ABD: S, NT, ND. No rebound. No HSM. EXTR: No c/c/e NEURO Normal gait.  PSYCH: Normally interactive. Conversant. Not depressed or anxious appearing.  Calm demeanor.  GU: normal exam, no masses or discharge.  Normal prostate exam.    Assessment and Plan: Type II or unspecified type diabetes mellitus without mention of complication, not stated as uncontrolled - Plan: Hemoglobin A1c, Microalbumin, urine, Pneumococcal conjugate vaccine 13-valent IM, CANCELED: Tdap vaccine greater than or equal to 7yo IM  Physical exam  Other and unspecified hyperlipidemia - Plan: Lipid panel  Await A1c and will be in touch with him.  Given immunizations as per orders.   He has not been taking his lipitor due to fatigue when he uses this medication.  Will check FLP and decide what to do next/ he would be willing to try lipitor again if necessary   Signed Lamar Blinks, MD

## 2014-06-24 ENCOUNTER — Other Ambulatory Visit: Payer: Medicare Other | Admitting: Lab

## 2014-06-24 ENCOUNTER — Ambulatory Visit (HOSPITAL_BASED_OUTPATIENT_CLINIC_OR_DEPARTMENT_OTHER): Payer: Medicare Other | Admitting: Hematology & Oncology

## 2014-06-24 DIAGNOSIS — C9000 Multiple myeloma not having achieved remission: Secondary | ICD-10-CM

## 2014-06-24 DIAGNOSIS — E611 Iron deficiency: Secondary | ICD-10-CM

## 2014-06-24 LAB — IRON AND TIBC CHCC
%SAT: 31 % (ref 20–55)
Iron: 95 ug/dL (ref 42–163)
TIBC: 310 ug/dL (ref 202–409)
UIBC: 215 ug/dL (ref 117–376)

## 2014-06-24 LAB — CBC WITH DIFFERENTIAL (CANCER CENTER ONLY)
BASO#: 0 10*3/uL (ref 0.0–0.2)
BASO%: 0.5 % (ref 0.0–2.0)
EOS%: 4.7 % (ref 0.0–7.0)
Eosinophils Absolute: 0.3 10*3/uL (ref 0.0–0.5)
HCT: 37.5 % — ABNORMAL LOW (ref 38.7–49.9)
HEMOGLOBIN: 12.8 g/dL — AB (ref 13.0–17.1)
LYMPH#: 2.4 10*3/uL (ref 0.9–3.3)
LYMPH%: 37.5 % (ref 14.0–48.0)
MCH: 29.6 pg (ref 28.0–33.4)
MCHC: 34.1 g/dL (ref 32.0–35.9)
MCV: 87 fL (ref 82–98)
MONO#: 0.5 10*3/uL (ref 0.1–0.9)
MONO%: 7.6 % (ref 0.0–13.0)
NEUT%: 49.7 % (ref 40.0–80.0)
NEUTROS ABS: 3.1 10*3/uL (ref 1.5–6.5)
Platelets: 251 10*3/uL (ref 145–400)
RBC: 4.32 10*6/uL (ref 4.20–5.70)
RDW: 14.3 % (ref 11.1–15.7)
WBC: 6.3 10*3/uL (ref 4.0–10.0)

## 2014-06-24 LAB — CMP (CANCER CENTER ONLY)
ALT(SGPT): 24 U/L (ref 10–47)
AST: 19 U/L (ref 11–38)
Albumin: 3.6 g/dL (ref 3.3–5.5)
Alkaline Phosphatase: 42 U/L (ref 26–84)
BUN: 10 mg/dL (ref 7–22)
CO2: 26 mEq/L (ref 18–33)
Calcium: 9.1 mg/dL (ref 8.0–10.3)
Chloride: 97 mEq/L — ABNORMAL LOW (ref 98–108)
Creat: 1.2 mg/dl (ref 0.6–1.2)
Glucose, Bld: 161 mg/dL — ABNORMAL HIGH (ref 73–118)
Potassium: 4 mEq/L (ref 3.3–4.7)
Sodium: 139 mEq/L (ref 128–145)
TOTAL PROTEIN: 7.7 g/dL (ref 6.4–8.1)
Total Bilirubin: 0.6 mg/dl (ref 0.20–1.60)

## 2014-06-24 LAB — FERRITIN CHCC: Ferritin: 179 ng/ml (ref 22–316)

## 2014-06-24 LAB — CHCC SATELLITE - SMEAR

## 2014-06-25 ENCOUNTER — Encounter: Payer: Self-pay | Admitting: *Deleted

## 2014-06-25 NOTE — Progress Notes (Signed)
Hematology and Oncology Follow Up Visit  Michael Kuck Sr. [id number] 1946-07-18 68 y.o. 06/25/2014   Principle Diagnosis:   IgG kappa smoldering myeloma  Iron deficiency anemia  Current Therapy:    Observation     Interim History:  Michael Wiggins is back for followup. We last saw him back in April. At that point in time, his myeloma studies showed a monoclonal spike of 0.43 g/L. His IgG level was 1220 mg/dL. His kappa light chain was 2.49 mg/dL.  His iron studies at that time showed a ferritin of 29 with an iron saturation of 21%. He did get some IV iron.  There's been some issues with his blood pressure. This also this issue with his blood sugars. This is being managed by his family doctor.  He still working. He's been very physical. He really has had no problems with this. There is no fever. He's had no rashes. No change in bowel or bladder habits.   Medications: Current outpatient prescriptions:amoxicillin (AMOXIL) 500 MG capsule, Take 500 mg by mouth as needed. FOR DENTAL PROCEDURES ONLY, Disp: , Rfl: ;  Blood Glucose Monitoring Suppl (BLOOD GLUCOSE METER) kit, Use as instructed, Disp: 1 each, Rfl: 0;  Ferrous Fumarate (IRON) 18 MG TBCR, Take by mouth every morning. OTC   MED, Disp: , Rfl: ;  Ginkgo Biloba 30 MG CAPS, Take by mouth every other day., Disp: , Rfl:  glipiZIDE (GLUCOTROL XL) 5 MG 24 hr tablet, Take 1 tablet (5 mg total) by mouth 2 (two) times daily at 8 am and 10 pm., Disp: 180 tablet, Rfl: 3;  Glucosamine-Chondroitin-MSM (TRIPLE FLEX PO), Take 1 tablet by mouth 2 (two) times daily. , Disp: , Rfl: ;  Lancets MISC, Use as instructed twice a day, Disp: 100 each, Rfl: 2 metFORMIN (GLUCOPHAGE) 1000 MG tablet, Take 1 tablet (1,000 mg total) by mouth 2 (two) times daily with a meal., Disp: 180 tablet, Rfl: 3;  metoprolol tartrate (LOPRESSOR) 25 MG tablet, Take 1 tablet (25 mg total) by mouth 2 (two) times daily. PATIENT NEEDS OFFICE VISIT FOR ADDITIONAL REFILLS - 2nd NOTICE, Disp:  180 tablet, Rfl: 3;  Multiple Vitamin (MULTIVITAMIN WITH MINERALS) TABS, Take 1 tablet by mouth every morning., Disp: , Rfl:   Allergies:  Allergies  Allergen Reactions  . Amlodipine Swelling  . Crestor [Rosuvastatin] Other (See Comments)    Per pt unable to focus    Past Medical History, Surgical history, Social history, and Family History were reviewed and updated.  Review of Systems: As above  Physical Exam:  vitals were not taken for this visit.  Well-developed well-nourished Afro-American gentleman. Lungs are clear. Cardiac exam regular in rhythm with no murmurs rubs or bruits. Head and neck exam shows no sclera icterus. He has no adenopathy in the neck. Abdomen is soft. Has good bowel sounds. There is no fluid wave. There is no palpable liver or spleen tip. Extremities shows no clubbing cyanosis or edema. Neurological exam shows no focal neurological deficits. Skin exam no rashes.  Lab Results  Component Value Date   WBC 6.3 06/24/2014   HGB 12.8* 06/24/2014   HCT 37.5* 06/24/2014   MCV 87 06/24/2014   PLT 251 06/24/2014     Chemistry      Component Value Date/Time   NA 139 06/24/2014 1024   NA 136 03/25/2014 0816   NA 135* 07/18/2013 0907   K 4.0 06/24/2014 1024   K 4.2 03/25/2014 0816   K 3.8 07/18/2013 0102  CL 97* 06/24/2014 1024   CL 101 03/25/2014 0816   CL 108* 05/15/2013 0912   CO2 26 06/24/2014 1024   CO2 22 03/25/2014 0816   CO2 23 07/18/2013 0907   BUN 10 06/24/2014 1024   BUN 14 03/25/2014 0816   BUN 22.3 07/18/2013 0907   CREATININE 1.2 06/24/2014 1024   CREATININE 0.88 03/25/2014 0816   CREATININE 1.1 07/18/2013 0907      Component Value Date/Time   CALCIUM 9.1 06/24/2014 1024   CALCIUM 9.7 03/25/2014 0816   CALCIUM 9.0 07/18/2013 0907   ALKPHOS 42 06/24/2014 1024   ALKPHOS 44 03/25/2014 0816   ALKPHOS 62 07/18/2013 0907   AST 19 06/24/2014 1024   AST 15 03/25/2014 0816   AST 10 07/18/2013 0907   ALT 24 06/24/2014 1024   ALT 14 03/25/2014 0816   ALT 14 07/18/2013 0907   BILITOT 0.60 06/24/2014 1024    BILITOT 0.4 03/25/2014 0816   BILITOT 0.23 07/18/2013 0907         Impression and Plan: [name] is 68 year old gentleman with IgG kappa smoldering myeloma. So far, I do not see any evidence of progressive disease.  A bone marrow test done on him back in September 2014 showed 14% plasma cells.  Cytogenetics were normal.   Spent a good half hour with him. He comes in with his girlfriend. She is having quite a bit of problems with her back. I helped her a little bit.   I answered all of their questions. I reviewed his blood smear. This looked pretty much bland.   Volanda Napoleon, MD 7/9/20157:40 AM

## 2014-06-26 LAB — PROTEIN ELECTROPHORESIS, SERUM, WITH REFLEX
ALPHA-2-GLOBULIN: 11.3 % (ref 7.1–11.8)
Albumin ELP: 52.5 % — ABNORMAL LOW (ref 55.8–66.1)
Alpha-1-Globulin: 6.5 % — ABNORMAL HIGH (ref 2.9–4.9)
Beta 2: 5 % (ref 3.2–6.5)
Beta Globulin: 7.2 % (ref 4.7–7.2)
Gamma Globulin: 17.5 % (ref 11.1–18.8)
M-Spike, %: 0.44 g/dL
TOTAL PROTEIN, SERUM ELECTROPHOR: 7.1 g/dL (ref 6.0–8.3)

## 2014-06-26 LAB — IFE INTERPRETATION

## 2014-06-26 LAB — RETICULOCYTES (CHCC)
ABS Retic: 51.8 10*3/uL (ref 19.0–186.0)
RBC.: 4.32 MIL/uL (ref 4.22–5.81)
RETIC CT PCT: 1.2 % (ref 0.4–2.3)

## 2014-06-26 LAB — IGG, IGA, IGM
IGG (IMMUNOGLOBIN G), SERUM: 1440 mg/dL (ref 650–1600)
IgA: 386 mg/dL — ABNORMAL HIGH (ref 68–379)
IgM, Serum: 22 mg/dL — ABNORMAL LOW (ref 41–251)

## 2014-06-26 LAB — KAPPA/LAMBDA LIGHT CHAINS
Kappa free light chain: 2.78 mg/dL — ABNORMAL HIGH (ref 0.33–1.94)
Kappa:Lambda Ratio: 1.49 (ref 0.26–1.65)
Lambda Free Lght Chn: 1.87 mg/dL (ref 0.57–2.63)

## 2014-06-26 LAB — BETA 2 MICROGLOBULIN, SERUM: Beta-2 Microglobulin: 1.93 mg/L (ref <=2.51)

## 2014-06-26 LAB — LACTATE DEHYDROGENASE: LDH: 130 U/L (ref 94–250)

## 2014-08-07 ENCOUNTER — Telehealth: Payer: Self-pay | Admitting: *Deleted

## 2014-08-07 NOTE — Telephone Encounter (Signed)
Patient returned phone call & stated that his eye doctor is Dr. Wille Glaser @ [facility] [phone]).  He is going to call & schedule his appointment & have them fax me a copy of his diabetic eye exam afterwards to [phone].

## 2014-08-07 NOTE — Telephone Encounter (Signed)
Phoned patient's cell number and Wilder me.

## 2014-09-21 ENCOUNTER — Encounter: Payer: Self-pay | Admitting: Family Medicine

## 2014-09-21 ENCOUNTER — Ambulatory Visit (INDEPENDENT_AMBULATORY_CARE_PROVIDER_SITE_OTHER): Payer: Medicare Other | Admitting: Family Medicine

## 2014-09-21 VITALS — BP 160/100 | HR 58 | Temp 97.8°F | Resp 16 | Ht 74.0 in | Wt 217.2 lb

## 2014-09-21 DIAGNOSIS — I10 Essential (primary) hypertension: Secondary | ICD-10-CM

## 2014-09-21 DIAGNOSIS — E119 Type 2 diabetes mellitus without complications: Secondary | ICD-10-CM

## 2014-09-21 DIAGNOSIS — Z23 Encounter for immunization: Secondary | ICD-10-CM

## 2014-09-21 LAB — POCT GLYCOSYLATED HEMOGLOBIN (HGB A1C): Hemoglobin A1C: 7.4

## 2014-09-21 MED ORDER — LOSARTAN POTASSIUM 50 MG PO TABS: ORAL_TABLET | ORAL | Status: DC

## 2014-09-21 NOTE — Progress Notes (Signed)
[facility] [address], [place] [address] [phone]  Date:  09/21/2014   Name:  Michael Seddon Sr.   DOB:  [birth year]   MRN:  [id number]  PCP:  Lamar Blinks, MD    Chief Complaint: Diabetes   History of Present Illness:  Michael Shimabukuro Sr. is a 68 y.o. very pleasant male patient who presents with the following:  Here today for a recheck.  He has HTN, DM, hyperlipidemia, and MM which is being followed by Dr. Marin Olp.    BP Readings from Last 3 Encounters:  09/21/14 160/100  06/01/14 140/92  04/01/14 171/93   Lab Results  Component Value Date   HGBA1C 7.8* 06/01/2014   He is just taking metoprolol 25 BID right now; had been on benicar HCT in the past.  He has noted that his BP is running high recently and feels that he likely needs to add another BP medication. Home readings are mid 130s- 150/ 80s- 90s.  Glucose running between 81 and 159  He is fasting today for labs He has been off cholesterol medication for a few months.  He had a hard time with crestor, and also with lipitor.  These both seemed to make him tired.  He would like to try taking his atorvastatin again but will take it at night; he has 10 and 20 mg pills at home.  He does not have any muscle aches or other problems with statins   Patient Active Problem List   Diagnosis Date Noted  . Encounter for therapeutic drug monitoring 03/13/2013  . Multiple myeloma 02/15/2013  . Edema 01/07/2013  . Finger laceration 08/21/2012  . Diabetes mellitus   . Hyperlipidemia   . Hypertension   . MGUS (monoclonal gammopathy of unknown significance)   . GERD (gastroesophageal reflux disease)     Past Medical History  Diagnosis Date  . Diabetes mellitus   . Hyperlipidemia   . Hypertension   . Osteoarthritis   . MGUS (monoclonal gammopathy of unknown significance)   . GERD (gastroesophageal reflux disease)   . Anemia   . Multiple myeloma 02/2013    normal cytogenetics and FISH panel on  03/11/2013.   . Cataract     Past Surgical History  Procedure Laterality Date  . Eye surgery      History  Substance Use Topics  . Smoking status: Former Smoker -- 2.00 packs/day for 35 years    Types: Cigarettes    Start date: 03/26/1963    Quit date: 12/18/1997  . Smokeless tobacco: Never Used     Comment: quit 15 years  . Alcohol Use: No    No family history on file.  Allergies  Allergen Reactions  . Amlodipine Swelling  . Crestor [Rosuvastatin] Other (See Comments)    Per pt unable to focus    Medication list has been reviewed and updated.  Current Outpatient Prescriptions on File Prior to Visit  Medication Sig Dispense Refill  . amoxicillin (AMOXIL) 500 MG capsule Take 500 mg by mouth as needed. FOR DENTAL PROCEDURES ONLY      . Blood Glucose Monitoring Suppl (BLOOD GLUCOSE METER) kit Use as instructed  1 each  0  . Ferrous Fumarate (IRON) 18 MG TBCR Take by mouth every morning. OTC   MED      . Ginkgo Biloba 30 MG CAPS Take by mouth every other day.      Marland Kitchen glipiZIDE (GLUCOTROL XL) 5 MG 24 hr tablet Take 1 tablet (  5 mg total) by mouth 2 (two) times daily at 8 am and 10 pm.  180 tablet  3  . Glucosamine-Chondroitin-MSM (TRIPLE FLEX PO) Take 1 tablet by mouth 2 (two) times daily.       . Lancets MISC Use as instructed twice a day  100 each  2  . metFORMIN (GLUCOPHAGE) 1000 MG tablet Take 1 tablet (1,000 mg total) by mouth 2 (two) times daily with a meal.  180 tablet  3  . metoprolol tartrate (LOPRESSOR) 25 MG tablet Take 1 tablet (25 mg total) by mouth 2 (two) times daily. PATIENT NEEDS OFFICE VISIT FOR ADDITIONAL REFILLS - 2nd NOTICE  180 tablet  3  . Multiple Vitamin (MULTIVITAMIN WITH MINERALS) TABS Take 1 tablet by mouth every morning.       No current facility-administered medications on file prior to visit.    Review of Systems:  As per HPI- otherwise negative.   Physical Examination: Filed Vitals:   09/21/14 0827  BP: 160/100  Pulse: 58  Temp: 97.8 F  (36.6 C)  Resp: 16   Filed Vitals:   09/21/14 0827  Height: _0  (1.88 m)  Weight: 217 lb 3.2 oz (98.521 kg)   Body mass index is 27.87 kg/(m^2). Ideal Body Weight: Weight in (lb) to have BMI = 25: 194.3  GEN: WDWN, NAD, Non-toxic, A & O x 3 HEENT: Atraumatic, Normocephalic. Neck supple. No masses, No LAD.  Bilateral TM wnl, oropharynx normal.  PEERL,EOMI.   Ears and Nose: No external deformity. CV: RRR, No M/G/R. No JVD. No thrill. No extra heart sounds. PULM: CTA B, no wheezes, crackles, rhonchi. No retractions. No resp. distress. No accessory muscle use. ABD: S, NT, ND EXTR: No c/c/e NEURO Normal gait.  PSYCH: Normally interactive. Conversant. Not depressed or anxious appearing.  Calm demeanor.  Looks well  Results for orders placed in visit on 09/21/14  POCT GLYCOSYLATED HEMOGLOBIN (HGB A1C)      Result Value Ref Range   Hemoglobin A1C 7.4      Assessment and Plan: Diabetes mellitus without complication - Plan: HM Diabetes Foot Exam, POCT glycosylated hemoglobin (Hb A1C)  Flu vaccine need - Plan: Flu Vaccine QUAD 36+ mos IM  Essential hypertension - Plan: losartan (COZAAR) 50 MG tablet  DM control is acceptable today; he will continue to work on diet and exercise Will add losartan 50; start with 1/2 pill and go up to a whole pill if needed He will try taking his lipitor 34m at night and then go to 20 mg if he can See patient instructions for more details.   Follow-up in 3 months   Signed JLamar Blinks MD

## 2014-09-21 NOTE — Patient Instructions (Addendum)
As a diabetic, there are several things you can do to monitor your condition and maintain your health.  1. Check your feet daily for any skin breakdown 2. Exercise and keep track of your diet 3. Let us know before you run out of your medications 4. Get your annual flu shot, and ask if you need a pneumonia shot 5. Ask if you are up to date on your labs; you should have an A1c every 6 months, a urine protein test annually, and a cholesterol test annually.  Your doctor may decide to do labs more often if indicated 6. Take off your shoes and socks at each visit.  Be sure your doctor examines your feet.   7. Ask about your blood pressure.  Your goal is 130/ 80 or less 8. Get an annual eye exam.  Please ask your ophthalmologist to send [place] your report 9. Keep up with your dental cleanings and exams.    Be sure to see your eye doctor!    Try taking 10mg  of atorvastatin (lipitor, for your cholesterol) at night.  If you do well with this try going to 20 mg.   For your blood pressure, start taking 1/2 of a losartan pill (this is similar to benicar but should be less expensive).  If you are running about 130/85 or less, stay with a 1/2 pill. If you are running higher please go to a whole pill   Please see me in 3 months; let's do a fasting cholesterol panel then

## 2014-10-26 ENCOUNTER — Encounter: Payer: Self-pay | Admitting: Hematology & Oncology

## 2014-10-26 ENCOUNTER — Ambulatory Visit (HOSPITAL_BASED_OUTPATIENT_CLINIC_OR_DEPARTMENT_OTHER): Payer: Medicare Other | Admitting: Hematology & Oncology

## 2014-10-26 ENCOUNTER — Ambulatory Visit (HOSPITAL_BASED_OUTPATIENT_CLINIC_OR_DEPARTMENT_OTHER): Payer: Medicare Other | Admitting: Lab

## 2014-10-26 VITALS — BP 178/102 | HR 100 | Temp 97.7°F | Resp 18 | Ht 72.0 in | Wt 220.0 lb

## 2014-10-26 DIAGNOSIS — C9000 Multiple myeloma not having achieved remission: Secondary | ICD-10-CM

## 2014-10-26 DIAGNOSIS — D649 Anemia, unspecified: Secondary | ICD-10-CM

## 2014-10-26 DIAGNOSIS — D508 Other iron deficiency anemias: Secondary | ICD-10-CM

## 2014-10-26 DIAGNOSIS — E611 Iron deficiency: Secondary | ICD-10-CM

## 2014-10-26 LAB — CBC WITH DIFFERENTIAL (CANCER CENTER ONLY)
BASO#: 0 10*3/uL (ref 0.0–0.2)
BASO%: 0.5 % (ref 0.0–2.0)
EOS%: 5.1 % (ref 0.0–7.0)
Eosinophils Absolute: 0.3 10*3/uL (ref 0.0–0.5)
HCT: 37.8 % — ABNORMAL LOW (ref 38.7–49.9)
HEMOGLOBIN: 12.7 g/dL — AB (ref 13.0–17.1)
LYMPH#: 2 10*3/uL (ref 0.9–3.3)
LYMPH%: 31.6 % (ref 14.0–48.0)
MCH: 29.6 pg (ref 28.0–33.4)
MCHC: 33.6 g/dL (ref 32.0–35.9)
MCV: 88 fL (ref 82–98)
MONO#: 0.5 10*3/uL (ref 0.1–0.9)
MONO%: 8.4 % (ref 0.0–13.0)
NEUT#: 3.4 10*3/uL (ref 1.5–6.5)
NEUT%: 54.4 % (ref 40.0–80.0)
PLATELETS: 256 10*3/uL (ref 145–400)
RBC: 4.29 10*6/uL (ref 4.20–5.70)
RDW: 13.4 % (ref 11.1–15.7)
WBC: 6.3 10*3/uL (ref 4.0–10.0)

## 2014-10-26 LAB — CMP (CANCER CENTER ONLY)
ALT(SGPT): 21 U/L (ref 10–47)
AST: 20 U/L (ref 11–38)
Albumin: 3.6 g/dL (ref 3.3–5.5)
Alkaline Phosphatase: 45 U/L (ref 26–84)
BILIRUBIN TOTAL: 0.6 mg/dL (ref 0.20–1.60)
BUN, Bld: 10 mg/dL (ref 7–22)
CALCIUM: 9.5 mg/dL (ref 8.0–10.3)
CO2: 28 meq/L (ref 18–33)
Chloride: 98 mEq/L (ref 98–108)
Creat: 0.9 mg/dl (ref 0.6–1.2)
GLUCOSE: 160 mg/dL — AB (ref 73–118)
Potassium: 3.9 mEq/L (ref 3.3–4.7)
Sodium: 142 mEq/L (ref 128–145)
TOTAL PROTEIN: 7.7 g/dL (ref 6.4–8.1)

## 2014-10-26 LAB — FERRITIN CHCC: Ferritin: 156 ng/ml (ref 22–316)

## 2014-10-26 LAB — IRON AND TIBC CHCC
%SAT: 24 % (ref 20–55)
IRON: 72 ug/dL (ref 42–163)
TIBC: 305 ug/dL (ref 202–409)
UIBC: 233 ug/dL (ref 117–376)

## 2014-10-26 NOTE — Progress Notes (Signed)
Hematology and Oncology Follow Up Visit  Michael Haydel Sr. [id number] 03-12-1946 68 y.o. 10/26/2014   Principle Diagnosis:   IgG kappa smoldering myeloma  Iron deficiency anemia  Current Therapy:    Observation     Interim History:  [name] is back for followup. We last saw him back in July. At that point in time, his myeloma studies showed a monoclonal spike of 0.44 g/L. His IgG level was 1440 mg/dL. His kappa light chain was 2.78 mg/dL.  His iron studies in July showed a ferritin of 179 with an iron saturation of 31%.   There's been some issues with his blood pressure. There also this issue with his blood sugars. This is being managed by his family doctor.  He still working. He's been very physical. He really has had no problems with this. There is no fever. He's had no rashes. No change in bowel or bladder habits. He has had no problems with pain. He has had no cough. He has had no fever.   Medications: Current outpatient prescriptions: aspirin 81 MG tablet, Take 81 mg by mouth daily., Disp: , Rfl: ;  Ferrous Fumarate (IRON) 18 MG TBCR, Take by mouth every morning. OTC   MED, Disp: , Rfl: ;  Ginkgo Biloba 30 MG CAPS, Take by mouth every other day., Disp: , Rfl: ;  glipiZIDE (GLUCOTROL XL) 5 MG 24 hr tablet, Take 1 tablet (5 mg total) by mouth 2 (two) times daily at 8 am and 10 pm., Disp: 180 tablet, Rfl: 3 Glucosamine-Chondroitin-MSM (TRIPLE FLEX PO), Take 1 tablet by mouth 2 (two) times daily. , Disp: , Rfl: ;  Lancets MISC, Use as instructed twice a day, Disp: 100 each, Rfl: 2;  metFORMIN (GLUCOPHAGE) 1000 MG tablet, Take 1 tablet (1,000 mg total) by mouth 2 (two) times daily with a meal., Disp: 180 tablet, Rfl: 3 metoprolol tartrate (LOPRESSOR) 25 MG tablet, Take 1 tablet (25 mg total) by mouth 2 (two) times daily. PATIENT NEEDS OFFICE VISIT FOR ADDITIONAL REFILLS - 2nd NOTICE, Disp: 180 tablet, Rfl: 3;  Multiple Vitamin (MULTIVITAMIN WITH MINERALS) TABS, Take 1 tablet by mouth  every morning., Disp: , Rfl: ;  amoxicillin (AMOXIL) 500 MG capsule, Take 500 mg by mouth as needed. FOR DENTAL PROCEDURES ONLY, Disp: , Rfl:  losartan (COZAAR) 50 MG tablet, Start with a 1/2 tablet daily and go to a whole tablet as needed (Patient taking differently: Start with a 1/2 tablet daily and go to a whole tablet as needed  10-26-14 DID NOT START YET), Disp: 90 tablet, Rfl: 3  Allergies:  Allergies  Allergen Reactions  . Amlodipine Swelling  . Crestor [Rosuvastatin] Other (See Comments)    Per pt unable to focus    Past Medical History, Surgical history, Social history, and Family History were reviewed and updated.  Review of Systems: As above  Physical Exam:  height is 6' (1.829 m) and weight is 220 lb (99.791 kg). His oral temperature is 97.7 F (36.5 C). His blood pressure is 178/102 and his pulse is 100. His respiration is 18.   Well-developed well-nourished Afro-American gentleman. Lungs are clear. Cardiac exam shows a regular rate and rhythm with no murmurs rubs or bruits. Head and neck exam shows no sclera icterus. He has no adenopathy in the neck. Abdomen is soft. Has good bowel sounds. There is no fluid wave. There is no palpable liver or spleen tip. Back exam shows no tenderness over the spine, ribs or hips. Extremities shows  no clubbing cyanosis or edema. He has good range of motion of his joints. He has good strength in his extremities. Neurological exam shows no focal neurological deficits. Skin exam no rashes.  Lab Results  Component Value Date   WBC 6.3 10/26/2014   HGB 12.7* 10/26/2014   HCT 37.8* 10/26/2014   MCV 88 10/26/2014   PLT 256 10/26/2014     Chemistry      Component Value Date/Time   NA 142 10/26/2014 1009   NA 136 03/25/2014 0816   NA 135* 07/18/2013 0907   K 3.9 10/26/2014 1009   K 4.2 03/25/2014 0816   K 3.8 07/18/2013 0907   CL 98 10/26/2014 1009   CL 101 03/25/2014 0816   CL 108* 05/15/2013 0912   CO2 28 10/26/2014 1009   CO2 22  03/25/2014 0816   CO2 23 07/18/2013 0907   BUN 10 10/26/2014 1009   BUN 14 03/25/2014 0816   BUN 22.3 07/18/2013 0907   CREATININE 0.9 10/26/2014 1009   CREATININE 0.88 03/25/2014 0816   CREATININE 1.1 07/18/2013 0907      Component Value Date/Time   CALCIUM 9.5 10/26/2014 1009   CALCIUM 9.7 03/25/2014 0816   CALCIUM 9.0 07/18/2013 0907   ALKPHOS 45 10/26/2014 1009   ALKPHOS 44 03/25/2014 0816   ALKPHOS 62 07/18/2013 0907   AST 20 10/26/2014 1009   AST 15 03/25/2014 0816   AST 10 07/18/2013 0907   ALT 21 10/26/2014 1009   ALT 14 03/25/2014 0816   ALT 14 07/18/2013 0907   BILITOT 0.60 10/26/2014 1009   BILITOT 0.4 03/25/2014 0816   BILITOT 0.23 07/18/2013 0907         Impression and Plan: Michael Wiggins is 68 year old gentleman with IgG kappa smoldering myeloma. So far, I do not see any evidence of progressive disease. His myeloma numbers are slowly trending upward. We will have to watch this closely.  A bone marrow test done on him back in September 2014 showed 14% plasma cells.  Cytogenetics were normal.   Spent a good half hour with him. I talked to him about his blood pressure issues. I told him that this and his blood sugars will be a much bigger problem for him in the long run then myeloma. We can easily treat the myeloma. However, if he does not get better control of his blood pressure and blood sugars, and has a cardiovascular or cerebrovascular event, then this would potentially be a long-term issue.   I will plan to get him back in another 4 months.   Volanda Napoleon, MD 11/9/201511:08 AM

## 2014-11-02 ENCOUNTER — Other Ambulatory Visit: Payer: Self-pay | Admitting: Family Medicine

## 2014-11-03 LAB — IGG, IGA, IGM
IgA: 386 mg/dL — ABNORMAL HIGH (ref 68–379)
IgG (Immunoglobin G), Serum: 1450 mg/dL (ref 650–1600)
IgM, Serum: 21 mg/dL — ABNORMAL LOW (ref 41–251)

## 2014-11-03 LAB — IFE INTERPRETATION

## 2014-11-03 LAB — KAPPA/LAMBDA LIGHT CHAINS
KAPPA LAMBDA RATIO: 1.53 (ref 0.26–1.65)
Kappa free light chain: 2.96 mg/dL — ABNORMAL HIGH (ref 0.33–1.94)
Lambda Free Lght Chn: 1.93 mg/dL (ref 0.57–2.63)

## 2014-11-03 LAB — PROTEIN ELECTROPHORESIS, SERUM, WITH REFLEX
ALBUMIN ELP: 53.6 % — AB (ref 55.8–66.1)
ALPHA-2-GLOBULIN: 11.5 % (ref 7.1–11.8)
Alpha-1-Globulin: 3.4 % (ref 2.9–4.9)
Beta 2: 5.8 % (ref 3.2–6.5)
Beta Globulin: 7.1 % (ref 4.7–7.2)
Gamma Globulin: 18.6 % (ref 11.1–18.8)
M-Spike, %: 0.48 g/dL
Total Protein, Serum Electrophoresis: 7 g/dL (ref 6.0–8.3)

## 2014-11-03 LAB — LACTATE DEHYDROGENASE: LDH: 126 U/L (ref 94–250)

## 2014-11-04 ENCOUNTER — Encounter: Payer: Self-pay | Admitting: *Deleted

## 2014-11-12 ENCOUNTER — Encounter: Payer: Self-pay | Admitting: Family Medicine

## 2014-11-12 DIAGNOSIS — E1129 Type 2 diabetes mellitus with other diabetic kidney complication: Secondary | ICD-10-CM | POA: Insufficient documentation

## 2014-11-12 DIAGNOSIS — R809 Proteinuria, unspecified: Secondary | ICD-10-CM

## 2014-11-21 ENCOUNTER — Ambulatory Visit (INDEPENDENT_AMBULATORY_CARE_PROVIDER_SITE_OTHER): Payer: Medicare Other | Admitting: Family Medicine

## 2014-11-21 VITALS — BP 160/90 | HR 61 | Temp 97.9°F | Resp 16 | Ht 75.0 in | Wt 224.8 lb

## 2014-11-21 DIAGNOSIS — E0829 Diabetes mellitus due to underlying condition with other diabetic kidney complication: Secondary | ICD-10-CM

## 2014-11-21 DIAGNOSIS — Z23 Encounter for immunization: Secondary | ICD-10-CM

## 2014-11-21 DIAGNOSIS — S60552A Superficial foreign body of left hand, initial encounter: Secondary | ICD-10-CM

## 2014-11-21 DIAGNOSIS — M79642 Pain in left hand: Secondary | ICD-10-CM

## 2014-11-21 MED ORDER — AMOXICILLIN-POT CLAVULANATE 875-125 MG PO TABS: 1.0000 | ORAL_TABLET | Freq: Two times a day (BID) | ORAL | Status: DC

## 2014-11-21 NOTE — Progress Notes (Signed)
5cc 2% lido without epi injected to anesthetize area . Area cleaned with betadine. Small x-incision over area. 0.5cm splinter removed. Incision closed with 1 simple inturrupted 5-0 prolene suture. Wound dressed.   Julieta Gutting, PA-C Physician Assistant-Certified [facility]  11/21/2014 4:32 PM

## 2014-11-21 NOTE — Progress Notes (Signed)
I actively participated during the procedure.

## 2014-11-21 NOTE — Progress Notes (Signed)
 Subjective:    Patient ID: Michael Rhett Sr., male    DOB: 08/27/1946, 68 y.o.   MRN: [id number]  HPI Chief Complaint  Patient presents with  . other    happen yesterday; pt states he has something in his hand; possible an splinter   This chart was scribed for Kristi Smith, MD by Raven Small, ED Scribe. This patient was seen in room 10 and the patient's care was started at 10:55 AM.  HPI Comments: Michael Sprowl Sr. is a 68 y.o. male who presents to the [facility] complaining of a foreign body to hand 1 day ago. 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 He denies pain with movement and itchiness. Pt states he tried to remove splinter himself.  Pt last TD- 10/16/2004.  He denies pain with movement and itchiness.  Denies fever/chills/sweats; denies streaking; denies axillary pain.  Past Medical History  Diagnosis Date  . Diabetes mellitus   . Hyperlipidemia   . Hypertension   . Osteoarthritis   . MGUS (monoclonal gammopathy of unknown significance)   . GERD (gastroesophageal reflux disease)   . Anemia   . Multiple myeloma 02/2013    normal cytogenetics and FISH panel on 03/11/2013.   . Cataract    Allergies  Allergen Reactions  . Amlodipine Swelling  . Crestor [Rosuvastatin] Other (See Comments)    Per pt unable to focus   Prior to Admission medications   Medication Sig Start Date End Date Taking? Authorizing Provider  aspirin 81 MG tablet Take 81 mg by mouth daily.   Yes Historical Provider, MD  Ferrous Fumarate (IRON) 18 MG TBCR Take by mouth every morning. OTC   MED   Yes Historical Provider, MD  Ginkgo Biloba 30 MG CAPS Take by mouth every other day.   Yes Historical Provider, MD  glipiZIDE (GLUCOTROL XL) 5 MG 24 hr tablet Take 1 tablet (5 mg total) by mouth 2 (two) times daily at 8 am and 10 pm. 01/20/14  Yes Chris W Guest, MD  Glucosamine-Chondroitin-MSM (TRIPLE FLEX PO) Take 1 tablet by mouth 2 (two) times  daily.    Yes Historical Provider, MD  Lancets MISC Use as instructed twice a day 01/31/14  Yes Heather M Marte, PA-C  losartan (COZAAR) 50 MG tablet Start with a 1/2 tablet daily and go to a whole tablet as needed Patient taking differently: Start with a 1/2 tablet daily and go to a whole tablet as needed  10-26-14 DID NOT START YET 09/21/14  Yes Jessica C [name], MD  metFORMIN (GLUCOPHAGE) 1000 MG tablet Take 1 tablet (1,000 mg total) by mouth 2 (two) times daily with a meal. 01/20/14  Yes Chris W Guest, MD  metoprolol tartrate (LOPRESSOR) 25 MG tablet TAKE 1 TABLET BY MOUTH TWICE A DAY 11/03/14  Yes Sarah L Weber, PA-C  Multiple Vitamin (MULTIVITAMIN WITH MINERALS) TABS Take 1 tablet by mouth every morning.   Yes Historical Provider, MD    Review of Systems  Skin: Positive for color change and wound.   Objective:   Physical Exam  Constitutional: He is oriented to person, place, and time. He appears well-developed and well-nourished. No distress.  HENT:  Head: Normocephalic and atraumatic.  Eyes: Conjunctivae and EOM are normal.  Neck: Neck supple.  Cardiovascular: Normal rate.   Pulmonary/Chest: Effort normal.  Musculoskeletal: Normal range of motion.  Neurological: He is alert and oriented to person, place, and time.    Skin: Skin is warm and dry.  Left thenar region with moderate swelling. Open wound with surrounding erythema. No streaking.  Psychiatric: He has a normal mood and affect. His behavior is normal.  Nursing note and vitals reviewed.  TDAP ADMINISTERED.    Assessment & Plan:  Need for diphtheria-tetanus-pertussis (Tdap) vaccine, adult/adolescent - Plan: Tdap vaccine greater than or equal to 7yo IM  Pain, hand joint, left  Splinter of hand, left, initial encounter  Diabetes mellitus due to underlying condition with other diabetic kidney complication   1.  Pain L hand: New.  Secondary to FB: recommend Tylenol PRN. 2.  FB/splinter L hand:  New.  S/p removal.  Local  wound care reviewed in detail.  Rx for Augmentin provided. 3.  S/p TDAP. 4. DMII: stable; high risk for secondary infection; local wound care.  Monitor sugars closely.   Meds ordered this encounter  Medications  . amoxicillin-clavulanate (AUGMENTIN) 875-125 MG per tablet    Sig: Take 1 tablet by mouth 2 (two) times daily.    Dispense:  20 tablet    Refill:  0    I personally performed the services described in this documentation, which was scribed in my presence. The recorded information has been reviewed and considered.  Kristi Smith, M.D.  [facility]  [place] [address] [place], [place]  [address] [phone] phone [phone] fax  

## 2014-11-28 ENCOUNTER — Ambulatory Visit (INDEPENDENT_AMBULATORY_CARE_PROVIDER_SITE_OTHER): Payer: Medicare Other

## 2014-11-28 VITALS — BP 160/90 | HR 63 | Temp 97.5°F | Resp 16 | Wt 224.0 lb

## 2014-11-28 DIAGNOSIS — S61412D Laceration without foreign body of left hand, subsequent encounter: Secondary | ICD-10-CM

## 2014-11-28 DIAGNOSIS — Z23 Encounter for immunization: Secondary | ICD-10-CM

## 2014-11-28 MED ORDER — ZOSTER VACCINE LIVE 19400 UNT/0.65ML ~~LOC~~ SOLR: 0.6500 mL | Freq: Once | SUBCUTANEOUS | Status: DC

## 2014-11-28 NOTE — Addendum Note (Signed)
Addended by: Gustavus Bryant on: 11/28/2014 11:32 AM   Modules accepted: Orders

## 2014-11-28 NOTE — Addendum Note (Signed)
Addended by: Gustavus Bryant on: 11/28/2014 11:38 AM   Modules accepted: Orders

## 2014-11-28 NOTE — Progress Notes (Signed)
Pt in for suture removal. Wound closed on 11/21/14.  No indicators of infection. Pt has not had fever, chills, redness, swelling or drainage.  Would is healed with one intact suture Suture removed without any complications. Followed up if needed.  Approved by Harrison Mons, PA-C

## 2014-11-28 NOTE — Addendum Note (Signed)
Addended by: Gustavus Bryant on: 11/28/2014 11:42 AM   Modules accepted: Orders

## 2014-12-08 ENCOUNTER — Ambulatory Visit (INDEPENDENT_AMBULATORY_CARE_PROVIDER_SITE_OTHER): Payer: Medicare Other | Admitting: Family Medicine

## 2014-12-08 VITALS — BP 154/95 | HR 73 | Temp 98.4°F | Resp 18 | Ht 75.0 in | Wt 221.0 lb

## 2014-12-08 DIAGNOSIS — Z862 Personal history of diseases of the blood and blood-forming organs and certain disorders involving the immune mechanism: Secondary | ICD-10-CM

## 2014-12-08 DIAGNOSIS — R519 Headache, unspecified: Secondary | ICD-10-CM

## 2014-12-08 DIAGNOSIS — R55 Syncope and collapse: Secondary | ICD-10-CM

## 2014-12-08 DIAGNOSIS — R51 Headache: Secondary | ICD-10-CM

## 2014-12-08 LAB — BASIC METABOLIC PANEL
BUN: 14 mg/dL (ref 6–23)
CHLORIDE: 98 meq/L (ref 96–112)
CO2: 26 meq/L (ref 19–32)
Calcium: 10.2 mg/dL (ref 8.4–10.5)
Creat: 0.94 mg/dL (ref 0.50–1.35)
GLUCOSE: 130 mg/dL — AB (ref 70–99)
Potassium: 4.5 mEq/L (ref 3.5–5.3)
SODIUM: 135 meq/L (ref 135–145)

## 2014-12-08 LAB — POCT CBC
Granulocyte percent: 50.5 %G (ref 37–80)
HEMATOCRIT: 41.1 % — AB (ref 43.5–53.7)
HEMOGLOBIN: 13.2 g/dL — AB (ref 14.1–18.1)
LYMPH, POC: 3.5 — AB (ref 0.6–3.4)
MCH, POC: 28.9 pg (ref 27–31.2)
MCHC: 32.1 g/dL (ref 31.8–35.4)
MCV: 90.1 fL (ref 80–97)
MID (cbc): 0.6 (ref 0–0.9)
MPV: 8.4 fL (ref 0–99.8)
POC Granulocyte: 4.1 (ref 2–6.9)
POC LYMPH PERCENT: 42.5 %L (ref 10–50)
POC MID %: 7 % (ref 0–12)
Platelet Count, POC: 246 10*3/uL (ref 142–424)
RBC: 4.56 M/uL — AB (ref 4.69–6.13)
RDW, POC: 14.9 %
WBC: 8.2 10*3/uL (ref 4.6–10.2)

## 2014-12-08 LAB — C-REACTIVE PROTEIN

## 2014-12-08 LAB — GLUCOSE, POCT (MANUAL RESULT ENTRY): POC Glucose: 132 mg/dl — AB (ref 70–99)

## 2014-12-08 MED ORDER — VALACYCLOVIR HCL 1 G PO TABS: 1000.0000 mg | ORAL_TABLET | Freq: Three times a day (TID) | ORAL | Status: DC

## 2014-12-08 NOTE — Patient Instructions (Addendum)
You may have shingles- watch carefully for any sign of a rash on the left side of your face We will go ahead and start valtrex in the meantime in hopes of avoiding any complication.   Also watch carefully for any worsening sx in your left eye.   Please see your eye doctor next week . Call Dr. Wille Glaser and ask if he is able to see this for you- if not please call [facility] and schedule an appt.  Address: [address], [place], [place] [address]  [phone]  I will also do a couple of blood tests to help [place] make sure this is not temporal arteritis- I will be in touch with you soon I will also refer you back to cardiology for further evaluation   Please keep me updated on your progress.

## 2014-12-08 NOTE — Progress Notes (Addendum)
[facility] [address], [place] [address] [phone]  Date:  12/08/2014   Name:  Michael Bratton Sr.   DOB:  01/18/1946   MRN:  [id number]  PCP:  [name],JESSICA, MD    Chief Complaint: Headache and Dizziness   History of Present Illness:  Michael Venturella Sr. is a 68 y.o. very pleasant male patient who presents with the following:  He has noted left sided head pain for about 4 days.  He does "not describe it as a headache", but notes that when he uses a facial muscle it will hurt. "it feels like an insect drilled its way into my head and decided to take lunch."    He has also noted some near fainting spells; 2 episodes as of late.  He was doing some strenuous physical labor and suddenly had to stop and catch his breath. Also, he had been bending down and then stood up- this caused him to go to his knees, did not have LOC and recovered within seconds.  He also fainted while he was driving about a year ago- he was evaluated and this was thought to be due to a medication He is still able to exercise without difficulty- he will have this pre- syncope more when he has been resting.    He has been dx with anemia in the past and received an iron infusion about 5 months ago- he was told he might need to have this twice a year.   He ran out of his iron supplement about a month ago  He noted that is BP was high the other day but he had held his losartan because he was worried about running too low He notes that his left eye is "running a bit, the eye feels like it is burning slightly No vision change  He had rheumatic fever as a child and used to see Dr. Berry.  He has not followed up in a while as he seemed to be doing very well.   BP Readings from Last 3 Encounters:  12/08/14 142/82  11/28/14 160/90  11/21/14 160/90     Patient Active Problem List   Diagnosis Date Noted  . Proteinuria due to type 2 diabetes mellitus 11/12/2014  . Encounter for therapeutic drug  monitoring 03/13/2013  . Multiple myeloma 02/15/2013  . Edema 01/07/2013  . Finger laceration 08/21/2012  . Diabetes mellitus due to underlying condition with other diabetic kidney complication   . Hyperlipidemia   . Hypertension   . MGUS (monoclonal gammopathy of unknown significance)   . GERD (gastroesophageal reflux disease)     Past Medical History  Diagnosis Date  . Diabetes mellitus   . Hyperlipidemia   . Hypertension   . Osteoarthritis   . MGUS (monoclonal gammopathy of unknown significance)   . GERD (gastroesophageal reflux disease)   . Anemia   . Multiple myeloma 02/2013    normal cytogenetics and FISH panel on 03/11/2013.   . Cataract     Past Surgical History  Procedure Laterality Date  . Eye surgery      History  Substance Use Topics  . Smoking status: Former Smoker -- 2.00 packs/day for 35 years    Types: Cigarettes    Start date: 03/26/1963    Quit date: 12/18/1997  . Smokeless tobacco: Never Used     Comment: quit 15 years  . Alcohol Use: No    History reviewed. No pertinent family history.  Allergies  Allergen Reactions  .   Amlodipine Swelling  . Crestor [Rosuvastatin] Other (See Comments)    Per pt unable to focus    Medication list has been reviewed and updated.  Current Outpatient Prescriptions on File Prior to Visit  Medication Sig Dispense Refill  . amoxicillin-clavulanate (AUGMENTIN) 875-125 MG per tablet Take 1 tablet by mouth 2 (two) times daily. 20 tablet 0  . glipiZIDE (GLUCOTROL XL) 5 MG 24 hr tablet Take 1 tablet (5 mg total) by mouth 2 (two) times daily at 8 am and 10 pm. 180 tablet 3  . Glucosamine-Chondroitin-MSM (TRIPLE FLEX PO) Take 1 tablet by mouth 2 (two) times daily.     . Lancets MISC Use as instructed twice a day 100 each 2  . losartan (COZAAR) 50 MG tablet Start with a 1/2 tablet daily and go to a whole tablet as needed (Patient taking differently: Start with a 1/2 tablet daily and go to a whole tablet as needed  10-26-14  DID NOT START YET) 90 tablet 3  . metFORMIN (GLUCOPHAGE) 1000 MG tablet Take 1 tablet (1,000 mg total) by mouth 2 (two) times daily with a meal. 180 tablet 3  . metoprolol tartrate (LOPRESSOR) 25 MG tablet TAKE 1 TABLET BY MOUTH TWICE A DAY 180 tablet 3  . Multiple Vitamin (MULTIVITAMIN WITH MINERALS) TABS Take 1 tablet by mouth every morning.    Marland Kitchen aspirin 81 MG tablet Take 81 mg by mouth daily.    . Ferrous Fumarate (IRON) 18 MG TBCR Take by mouth every morning. OTC   MED    . Ginkgo Biloba 30 MG CAPS Take by mouth every other day.    . zoster vaccine live, PF, (ZOSTAVAX) 41937 UNT/0.65ML injection Inject 19,400 Units into the skin once. (Patient not taking: Reported on 12/08/2014) 1 each 0   No current facility-administered medications on file prior to visit.    Review of Systems:  As per HPI- otherwise negative.   Physical Examination: Filed Vitals:   12/08/14 1136  BP: 142/82  Pulse: 74  Temp: 98.4 F (36.9 C)  Resp: 18   Filed Vitals:   12/08/14 1136  Height: 6' 3" (1.905 m)  Weight: 221 lb (100.245 kg)   Body mass index is 27.62 kg/(m^2). Ideal Body Weight: Weight in (lb) to have BMI = 25: 199.6  GEN: WDWN, NAD, Non-toxic, A & O x 3, looks well HEENT: Atraumatic, Normocephalic. Neck supple. No masses, No LAD.  Bilateral TM wnl, oropharynx normal.  PEERL,EOMI.   He notes mild tenderness over the left side of the face, non- specific.  No rash or ptosis.  Denies any tooth pain Ears and Nose: No external deformity. CV: RRR, No M/G/R. No JVD. No thrill. No extra heart sounds. PULM: CTA B, no wheezes, crackles, rhonchi. No retractions. No resp. distress. No accessory muscle use. ABD: S, NT, ND, +BS. No rebound. No HSM. EXTR: No c/c/e NEURO Normal gait.  Normal strength, sensation and DTR all extremities.  Normal balance PSYCH: Normally interactive. Conversant. Not depressed or anxious appearing.  Calm demeanor.  Negative fluorescin left eye.  No visible rash on face  Lab  Results  Component Value Date   HGBA1C 7.4 09/21/2014   See orthostatics  Results for orders placed or performed in visit on 12/08/14  POCT CBC  Result Value Ref Range   WBC 8.2 4.6 - 10.2 K/uL   Lymph, poc 3.5 (A) 0.6 - 3.4   POC LYMPH PERCENT 42.5 10 - 50 %L   MID (cbc) 0.6 0 -  0.9   POC MID % 7.0 0 - 12 %M   POC Granulocyte 4.1 2 - 6.9   Granulocyte percent 50.5 37 - 80 %G   RBC 4.56 (A) 4.69 - 6.13 M/uL   Hemoglobin 13.2 (A) 14.1 - 18.1 g/dL   HCT, POC 41.1 (A) 43.5 - 53.7 %   MCV 90.1 80 - 97 fL   MCH, POC 28.9 27 - 31.2 pg   MCHC 32.1 31.8 - 35.4 g/dL   RDW, POC 14.9 %   Platelet Count, POC 246 142 - 424 K/uL   MPV 8.4 0 - 99.8 fL  POCT glucose (manual entry)  Result Value Ref Range   POC Glucose 132 (A) 70 - 99 mg/dl   Assessment and Plan: History of iron deficiency anemia - Plan: POCT CBC, POCT glucose (manual entry), Ferritin, Basic metabolic panel  Left facial pain - Plan: valACYclovir (VALTREX) 1000 MG tablet, POCT SEDIMENTATION RATE, C-reactive protein  Pre-syncope  Kristine is here today with a few different concerns.   His left facial pain may be TMJ, but also consider something more significant such as zoster or temporal arteritis.  Will start valtrex now, and he will watch carefully for any rash.  He will see his eye doctor next week for a recheck.  Await ESR and CRP.   Reassured that he does not appear to have any significant anemia at this time.  Await ferritin Will refer back to cardiology for pre- syncope.  He has a history of rheumatic fever, and has been noted to have bradycardia and a 1st degree block in the past.  This may be related to his current sx.    Signed Lamar Blinks, MD  12/29 addnd.  Sed rate sent out instead of being done in house so I received late on 12/23.  Called pt 12/24.  He reported that he was overall feeling better, eye was better, vision ok.  He also reported seeing his optometrist the day prior and being told all looked ok.  I  called and spoke with optho on call as his ESR was a bit high.  She confirmed that this was unlikely to represent TA, but that he should see his eye professional the next week for a recheck.  Called pt and gave him this information. Called 12/29:  He reports that his sx are gone.  He will see cardiology tomorrow.  He plans to come in for a CPE in a few months and we will check his ESR again then.

## 2014-12-09 ENCOUNTER — Telehealth: Payer: Self-pay

## 2014-12-09 LAB — POCT SEDIMENTATION RATE: POCT SED RATE: 57 mm/h — AB (ref 0–22)

## 2014-12-09 LAB — FERRITIN: Ferritin: 174 ng/mL (ref 22–322)

## 2014-12-15 ENCOUNTER — Encounter: Payer: Self-pay | Admitting: Family Medicine

## 2014-12-16 ENCOUNTER — Telehealth: Payer: Self-pay

## 2014-12-16 ENCOUNTER — Ambulatory Visit (INDEPENDENT_AMBULATORY_CARE_PROVIDER_SITE_OTHER): Payer: Medicare Other | Admitting: Cardiovascular Disease

## 2014-12-16 ENCOUNTER — Ambulatory Visit: Payer: Medicare Other | Admitting: Cardiovascular Disease

## 2014-12-16 ENCOUNTER — Encounter: Payer: Self-pay | Admitting: Cardiovascular Disease

## 2014-12-16 VITALS — BP 152/88 | HR 65 | Ht 75.0 in | Wt 222.8 lb

## 2014-12-16 DIAGNOSIS — I4510 Unspecified right bundle-branch block: Secondary | ICD-10-CM

## 2014-12-16 DIAGNOSIS — E785 Hyperlipidemia, unspecified: Secondary | ICD-10-CM

## 2014-12-16 DIAGNOSIS — R55 Syncope and collapse: Secondary | ICD-10-CM | POA: Insufficient documentation

## 2014-12-16 DIAGNOSIS — I10 Essential (primary) hypertension: Secondary | ICD-10-CM

## 2014-12-16 NOTE — Assessment & Plan Note (Signed)
History of hypertension with blood pressure measured today 152/88. He is on losartan 50 mg a day and metoprolol titrate 25 mg. Continue current medications at current dosing

## 2014-12-16 NOTE — Assessment & Plan Note (Signed)
chronic

## 2014-12-16 NOTE — Assessment & Plan Note (Signed)
The patient was referred back to me by Dr. Edilia Bo first several episodes of near syncope or syncope. To these episodes occurred during strenuous exertion while performing his job. What occurred after changing positions quickly suggesting orthostasis. He denies chest pain or shortness of breath. He has chronic right upper lobe branch block. I think his episodes are explainable by his activities. I do not think further workup is necessary at this time.

## 2014-12-16 NOTE — Assessment & Plan Note (Signed)
Objective hyperlipidemia in the past. He was on atorvastatin 20 mg by last saw him in 2013. His primary care physician has since stopped that and is following him.

## 2014-12-16 NOTE — Telephone Encounter (Signed)
Pt of Dr. Lorelei Pont saw cardiologist, and was told that everything was the same as three years ago.

## 2014-12-16 NOTE — Progress Notes (Signed)
12/16/2014 Lucien Mons Sr.   30-Jul-1946  [id number]  Primary Physician Lamar Blinks, MD Primary Cardiologist: Lorretta Harp MD Renae Gloss   HPI:  Mr. Rosanna Randy is a 68 year old thin appearing married African-American male father of 2 children, grandfather and 6 grandchildren who I last saw in the office 01/02/12 area and he does maintenance work. His problems include chronic right bundle-branch block, treated hypertension, hyperlipidemia and non-insulin-dependent requiring diabetes. He had a negative Myoview performed in 2006 and again in 2013. His EF has been normal by 2-D echo. He denies chest pain or shortness of breath. He has developed multiple myeloma and is being treated by Dr. Elnoria Howard . He has had several episodes of syncope or near syncope that occurred after strenuous exertion and rapid change in position.   Current Outpatient Prescriptions  Medication Sig Dispense Refill  . glipiZIDE (GLUCOTROL XL) 5 MG 24 hr tablet Take 1 tablet (5 mg total) by mouth 2 (two) times daily at 8 am and 10 pm. 180 tablet 3  . Glucosamine-Chondroitin-MSM (TRIPLE FLEX PO) Take 1 tablet by mouth 2 (two) times daily.     . Lancets MISC Use as instructed twice a day 100 each 2  . losartan (COZAAR) 50 MG tablet Start with a 1/2 tablet daily and go to a whole tablet as needed (Patient taking differently: Start with a 1/2 tablet daily and go to a whole tablet as needed  10-26-14 DID NOT START YET) 90 tablet 3  . metFORMIN (GLUCOPHAGE) 1000 MG tablet Take 1 tablet (1,000 mg total) by mouth 2 (two) times daily with a meal. 180 tablet 3  . metoprolol tartrate (LOPRESSOR) 25 MG tablet TAKE 1 TABLET BY MOUTH TWICE A DAY 180 tablet 3  . Multiple Vitamin (MULTIVITAMIN WITH MINERALS) TABS Take 1 tablet by mouth every morning.    . valACYclovir (VALTREX) 1000 MG tablet Take 1 tablet (1,000 mg total) by mouth 3 (three) times daily. 21 tablet 0  . zoster vaccine live, PF, (ZOSTAVAX) 62130 UNT/0.65ML  injection Inject 19,400 Units into the skin once. 1 each 0   No current facility-administered medications for this visit.    Allergies  Allergen Reactions  . Amlodipine Swelling  . Crestor [Rosuvastatin] Other (See Comments)    Per pt unable to focus    History   Social History  . Marital Status: Married    Spouse Name: N/A    Number of Children: N/A  . Years of Education: N/A   Occupational History  . Not on file.   Social History Main Topics  . Smoking status: Former Smoker -- 2.00 packs/day for 35 years    Types: Cigarettes    Start date: 03/26/1963    Quit date: 12/18/1997  . Smokeless tobacco: Never Used     Comment: quit 15 years  . Alcohol Use: No  . Drug Use: No  . Sexual Activity: Yes    Birth Control/ Protection: None   Other Topics Concern  . Not on file   Social History Narrative     Review of Systems: General: negative for chills, fever, night sweats or weight changes.  Cardiovascular: negative for chest pain, dyspnea on exertion, edema, orthopnea, palpitations, paroxysmal nocturnal dyspnea or shortness of breath Dermatological: negative for rash Respiratory: negative for cough or wheezing Urologic: negative for hematuria Abdominal: negative for nausea, vomiting, diarrhea, bright red blood per rectum, melena, or hematemesis Neurologic: negative for visual changes, syncope, or dizziness All other systems reviewed and are otherwise  negative except as noted above.    Blood pressure 152/88, pulse 65, height $RemoveBe'6\' 3"'WcIkWEmrs$  (1.905 m), weight 222 lb 12.8 oz (101.061 kg).  General appearance: alert and no distress Neck: no adenopathy, no carotid bruit, no JVD, supple, symmetrical, trachea midline and thyroid not enlarged, symmetric, no tenderness/mass/nodules Lungs: clear to auscultation bilaterally Heart: regular rate and rhythm, S1, S2 normal, no murmur, click, rub or gallop Extremities: extremities normal, atraumatic, no cyanosis or edema  EKG normal sinus  rhythm 65 with right bundle branch block unchanged from prior EKGs. A prescription reviewed this EKG  ASSESSMENT AND PLAN:   Hypertension History of hypertension with blood pressure measured today 152/88. He is on losartan 50 mg a day and metoprolol titrate 25 mg. Continue current medications at current dosing  Hyperlipidemia Objective hyperlipidemia in the past. He was on atorvastatin 20 mg by last saw him in 2013. His primary care physician has since stopped that and is following him.  Right bundle branch block chronic  Syncope The patient was referred back to me by Dr. Edilia Bo first several episodes of near syncope or syncope. To these episodes occurred during strenuous exertion while performing his job. What occurred after changing positions quickly suggesting orthostasis. He denies chest pain or shortness of breath. He has chronic right upper lobe branch block. I think his episodes are explainable by his activities. I do not think further workup is necessary at this time.      Lorretta Harp MD FACP,FACC,FAHA, [facility] 12/16/2014 9:55 AM

## 2014-12-16 NOTE — Patient Instructions (Signed)
Please follow up with Dr. Gwenlyn Found as needed.

## 2014-12-21 ENCOUNTER — Ambulatory Visit: Payer: Medicare Other | Admitting: Family Medicine

## 2014-12-23 DIAGNOSIS — M9903 Segmental and somatic dysfunction of lumbar region: Secondary | ICD-10-CM | POA: Diagnosis not present

## 2014-12-23 DIAGNOSIS — M5136 Other intervertebral disc degeneration, lumbar region: Secondary | ICD-10-CM | POA: Diagnosis not present

## 2014-12-23 DIAGNOSIS — M955 Acquired deformity of pelvis: Secondary | ICD-10-CM | POA: Diagnosis not present

## 2014-12-23 DIAGNOSIS — M9905 Segmental and somatic dysfunction of pelvic region: Secondary | ICD-10-CM | POA: Diagnosis not present

## 2015-01-25 DIAGNOSIS — M5136 Other intervertebral disc degeneration, lumbar region: Secondary | ICD-10-CM | POA: Diagnosis not present

## 2015-01-25 DIAGNOSIS — M9905 Segmental and somatic dysfunction of pelvic region: Secondary | ICD-10-CM | POA: Diagnosis not present

## 2015-01-25 DIAGNOSIS — M955 Acquired deformity of pelvis: Secondary | ICD-10-CM | POA: Diagnosis not present

## 2015-01-25 DIAGNOSIS — M9903 Segmental and somatic dysfunction of lumbar region: Secondary | ICD-10-CM | POA: Diagnosis not present

## 2015-01-29 ENCOUNTER — Other Ambulatory Visit: Payer: Self-pay | Admitting: Internal Medicine

## 2015-02-04 ENCOUNTER — Other Ambulatory Visit: Payer: Self-pay | Admitting: Family Medicine

## 2015-02-24 ENCOUNTER — Telehealth: Payer: Self-pay | Admitting: Hematology & Oncology

## 2015-02-24 ENCOUNTER — Other Ambulatory Visit: Payer: Medicare Other | Admitting: Lab

## 2015-02-24 ENCOUNTER — Ambulatory Visit: Payer: Medicare Other | Admitting: Hematology & Oncology

## 2015-02-24 NOTE — Telephone Encounter (Signed)
Patient called and cx 02/24/15 apt and resch for 03/09/15

## 2015-03-03 DIAGNOSIS — M9905 Segmental and somatic dysfunction of pelvic region: Secondary | ICD-10-CM | POA: Diagnosis not present

## 2015-03-03 DIAGNOSIS — M5136 Other intervertebral disc degeneration, lumbar region: Secondary | ICD-10-CM | POA: Diagnosis not present

## 2015-03-03 DIAGNOSIS — M955 Acquired deformity of pelvis: Secondary | ICD-10-CM | POA: Diagnosis not present

## 2015-03-03 DIAGNOSIS — M9903 Segmental and somatic dysfunction of lumbar region: Secondary | ICD-10-CM | POA: Diagnosis not present

## 2015-03-05 ENCOUNTER — Other Ambulatory Visit: Payer: Medicare Other | Admitting: Lab

## 2015-03-05 DIAGNOSIS — C9000 Multiple myeloma not having achieved remission: Secondary | ICD-10-CM | POA: Diagnosis not present

## 2015-03-09 ENCOUNTER — Ambulatory Visit (HOSPITAL_BASED_OUTPATIENT_CLINIC_OR_DEPARTMENT_OTHER): Payer: Medicare Other | Admitting: Lab

## 2015-03-09 ENCOUNTER — Encounter: Payer: Self-pay | Admitting: Hematology & Oncology

## 2015-03-09 ENCOUNTER — Ambulatory Visit (HOSPITAL_BASED_OUTPATIENT_CLINIC_OR_DEPARTMENT_OTHER): Payer: Medicare Other | Admitting: Hematology & Oncology

## 2015-03-09 VITALS — BP 166/97 | HR 58 | Temp 98.1°F | Resp 18 | Ht 73.0 in | Wt 224.0 lb

## 2015-03-09 DIAGNOSIS — D508 Other iron deficiency anemias: Secondary | ICD-10-CM | POA: Diagnosis not present

## 2015-03-09 DIAGNOSIS — C9000 Multiple myeloma not having achieved remission: Secondary | ICD-10-CM

## 2015-03-09 DIAGNOSIS — E119 Type 2 diabetes mellitus without complications: Secondary | ICD-10-CM

## 2015-03-09 LAB — IRON AND TIBC CHCC
%SAT: 27 % (ref 20–55)
IRON: 81 ug/dL (ref 42–163)
TIBC: 301 ug/dL (ref 202–409)
UIBC: 221 ug/dL (ref 117–376)

## 2015-03-09 LAB — CMP (CANCER CENTER ONLY)
ALT(SGPT): 18 U/L (ref 10–47)
AST: 18 U/L (ref 11–38)
Albumin: 3.6 g/dL (ref 3.3–5.5)
Alkaline Phosphatase: 54 U/L (ref 26–84)
BUN, Bld: 10 mg/dL (ref 7–22)
CO2: 30 meq/L (ref 18–33)
CREATININE: 1.1 mg/dL (ref 0.6–1.2)
Calcium: 9.6 mg/dL (ref 8.0–10.3)
Chloride: 98 mEq/L (ref 98–108)
Glucose, Bld: 209 mg/dL — ABNORMAL HIGH (ref 73–118)
Potassium: 4.3 mEq/L (ref 3.3–4.7)
Sodium: 138 mEq/L (ref 128–145)
Total Bilirubin: 0.6 mg/dl (ref 0.20–1.60)
Total Protein: 8.3 g/dL — ABNORMAL HIGH (ref 6.4–8.1)

## 2015-03-09 LAB — CBC WITH DIFFERENTIAL (CANCER CENTER ONLY)
BASO#: 0 10*3/uL (ref 0.0–0.2)
BASO%: 0.4 % (ref 0.0–2.0)
EOS%: 5.3 % (ref 0.0–7.0)
Eosinophils Absolute: 0.4 10*3/uL (ref 0.0–0.5)
HCT: 36.7 % — ABNORMAL LOW (ref 38.7–49.9)
HGB: 12.3 g/dL — ABNORMAL LOW (ref 13.0–17.1)
LYMPH#: 2.6 10*3/uL (ref 0.9–3.3)
LYMPH%: 30.8 % (ref 14.0–48.0)
MCH: 29.1 pg (ref 28.0–33.4)
MCHC: 33.5 g/dL (ref 32.0–35.9)
MCV: 87 fL (ref 82–98)
MONO#: 0.8 10*3/uL (ref 0.1–0.9)
MONO%: 9 % (ref 0.0–13.0)
NEUT%: 54.5 % (ref 40.0–80.0)
NEUTROS ABS: 4.5 10*3/uL (ref 1.5–6.5)
Platelets: 295 10*3/uL (ref 145–400)
RBC: 4.22 10*6/uL (ref 4.20–5.70)
RDW: 13.5 % (ref 11.1–15.7)
WBC: 8.3 10*3/uL (ref 4.0–10.0)

## 2015-03-09 LAB — FERRITIN CHCC: Ferritin: 174 ng/ml (ref 22–316)

## 2015-03-09 LAB — CHCC SATELLITE - SMEAR

## 2015-03-09 NOTE — Progress Notes (Signed)
Hematology and Oncology Follow Up Visit  Michael Austad Sr. [id number] 1946/01/26 69 y.o. 03/09/2015   Principle Diagnosis:   IgG kappa smoldering myeloma  Iron deficiency anemia  Current Therapy:    Observation     Interim History:  [name] is back for followup. We last saw him back in November. At that point in time, his myeloma studies showed a monoclonal spike of 0. 48 g/L. His IgG level was 1450 mg/dL. His kappa light chain was 2.96 mg/dL.  His iron studies in November showed a ferritin of 156 with an iron saturation of 24%.   There's been some issues with his blood pressure. There also this issue with his blood sugars. This is being managed by his family doctor. I'm not sure that Michael Wiggins is all that worried about these issues.  He still working. He's been very physical. He really has had no problems with this. There is no fever. He's had no rashes. No change in bowel or bladder habits. He has had no problems with pain. He has had no cough. He has had no fever.  He Bross in a 24-hour urine last week. I do not have the results of this.   Medications:  Current outpatient prescriptions:  .  glipiZIDE (GLUCOTROL XL) 5 MG 24 hr tablet, TAKE 1 TABLET BY MOUTH TWICE A DAY (8 AM AND 10 PM), Disp: 180 tablet, Rfl: 0 .  Glucosamine-Chondroitin-MSM (TRIPLE FLEX PO), Take 1 tablet by mouth 2 (two) times daily. , Disp: , Rfl:  .  Lancets MISC, Use as instructed twice a day, Disp: 100 each, Rfl: 2 .  losartan (COZAAR) 50 MG tablet, Start with a 1/2 tablet daily and go to a whole tablet as needed (Patient taking differently: Start with a 1/2 tablet daily and go to a whole tablet as need), Disp: 90 tablet, Rfl: 3 .  metFORMIN (GLUCOPHAGE) 1000 MG tablet, Take 1 tablet (1,000 mg total) by mouth 2 (two) times daily with a meal., Disp: 180 tablet, Rfl: 3 .  metoprolol tartrate (LOPRESSOR) 25 MG tablet, TAKE 1 TABLET BY MOUTH TWICE A DAY, Disp: 180 tablet, Rfl: 3 .  Multiple Vitamin  (MULTIVITAMIN WITH MINERALS) TABS, Take 1 tablet by mouth every morning., Disp: , Rfl:   Allergies:  Allergies  Allergen Reactions  . Amlodipine Swelling  . Crestor [Rosuvastatin] Other (See Comments)    Per pt unable to focus    Past Medical History, Surgical history, Social history, and Family History were reviewed and updated.  Review of Systems: As above  Physical Exam:  height is 6\' 1"  (1.854 m) and weight is 224 lb (101.606 kg). His oral temperature is 98.1 F (36.7 C). His blood pressure is 166/97 and his pulse is 58. His respiration is 18.   Well-developed well-nourished Afro-American gentleman. Lungs are clear. Cardiac exam shows a regular rate and rhythm with no murmurs rubs or bruits. Head and neck exam shows no sclera icterus. He has no adenopathy in the neck. Abdomen is soft. Has good bowel sounds. There is no fluid wave. There is no palpable liver or spleen tip. Back exam shows no tenderness over the spine, ribs or hips. Extremities shows no clubbing cyanosis or edema. He has good range of motion of his joints. He has good strength in his extremities. Neurological exam shows no focal neurological deficits. Skin exam no rashes.  Lab Results  Component Value Date   WBC 8.3 03/09/2015   HGB 12.3* 03/09/2015   HCT  36.7* 03/09/2015   MCV 87 03/09/2015   PLT 295 03/09/2015     Chemistry      Component Value Date/Time   NA 138 03/09/2015 1111   NA 135 12/08/2014 1217   NA 135* 07/18/2013 0907   K 4.3 03/09/2015 1111   K 4.5 12/08/2014 1217   K 3.8 07/18/2013 0907   CL 98 03/09/2015 1111   CL 98 12/08/2014 1217   CL 108* 05/15/2013 0912   CO2 30 03/09/2015 1111   CO2 26 12/08/2014 1217   CO2 23 07/18/2013 0907   BUN 10 03/09/2015 1111   BUN 14 12/08/2014 1217   BUN 22.3 07/18/2013 0907   CREATININE 1.1 03/09/2015 1111   CREATININE 0.88 03/25/2014 0816   CREATININE 1.1 07/18/2013 0907      Component Value Date/Time   CALCIUM 9.6 03/09/2015 1111   CALCIUM 10.2  12/08/2014 1217   CALCIUM 9.0 07/18/2013 0907   ALKPHOS 54 03/09/2015 1111   ALKPHOS 44 03/25/2014 0816   ALKPHOS 62 07/18/2013 0907   AST 18 03/09/2015 1111   AST 15 03/25/2014 0816   AST 10 07/18/2013 0907   ALT 18 03/09/2015 1111   ALT 14 03/25/2014 0816   ALT 14 07/18/2013 0907   BILITOT 0.60 03/09/2015 1111   BILITOT 0.4 03/25/2014 0816   BILITOT 0.23 07/18/2013 0907         Impression and Plan: Michael Wiggins is 69 year old gentleman with IgG kappa smoldering myeloma. So far, I do not see any evidence of progressive disease. His myeloma numbers are slowly trending upward. His total protein level was on the higher side this time.We will have to watch this.  A bone marrow test done on him back in September 2014 showed 14% plasma cells.  Cytogenetics were normal.   Spent a good half hour with him. I talked to him about his blood pressure issues. I told him that this and his blood sugars will be a much bigger problem for him in the long run then myeloma. We can easily treat the myeloma. However, if he does not get better control of his blood pressure and blood sugars, and has a cardiovascular or cerebrovascular event, then this would potentially be a long-term issue.   I will plan to get him back in another 4 months.   Volanda Napoleon, MD 3/22/201612:17 PM

## 2015-03-10 ENCOUNTER — Telehealth: Payer: Self-pay | Admitting: *Deleted

## 2015-03-10 LAB — 24 HR URINE,KAPPA/LAMBDA LIGHT CHAINS
24H Urine Volume: 2500 mL/24 h
Measured Kappa Chain: 0.85 mg/dL (ref <2.00)
Measured Lambda Chain: <0.4 mg/dL (ref <2.00)
Total Kappa Chain: 21.25 mg/24 h

## 2015-03-10 LAB — UIFE/LIGHT CHAINS/TP QN, 24-HR UR
ALPHA 1 UR: DETECTED — AB
Albumin, U: DETECTED
Alpha 2, Urine: DETECTED — AB
Beta, Urine: DETECTED — AB
GAMMA UR: DETECTED — AB
TIME-UPE24: 24 h
TOTAL PROTEIN, URINE-UPE24: 15 mg/dL (ref 5–25)
Total Protein, Urine-Ur/day: 375 mg/d — ABNORMAL HIGH (ref <150)
VOLUME, URINE-UPE24: 2500 mL

## 2015-03-10 NOTE — Telephone Encounter (Signed)
-----   Message from Volanda Napoleon, MD sent at 03/10/2015  2:14 PM EDT ----- Call - urine studies are better!!  Less protein!!! pete

## 2015-03-12 LAB — PROTEIN ELECTROPHORESIS, SERUM, WITH REFLEX
ALBUMIN ELP: 3.9 g/dL (ref 3.8–4.8)
ALPHA-1-GLOBULIN: 0.3 g/dL (ref 0.2–0.3)
ALPHA-2-GLOBULIN: 0.9 g/dL (ref 0.5–0.9)
Abnormal Protein Band1: 0.5 g/dL
BETA GLOBULIN: 0.5 g/dL (ref 0.4–0.6)
Beta 2: 0.4 g/dL (ref 0.2–0.5)
Gamma Globulin: 1.4 g/dL (ref 0.8–1.7)
TOTAL PROTEIN, SERUM ELECTROPHOR: 7.4 g/dL (ref 6.1–8.1)

## 2015-03-12 LAB — IFE INTERPRETATION

## 2015-03-12 LAB — KAPPA/LAMBDA LIGHT CHAINS
Kappa free light chain: 3.96 mg/dL — ABNORMAL HIGH (ref 0.33–1.94)
Kappa:Lambda Ratio: 1.83 — ABNORMAL HIGH (ref 0.26–1.65)
Lambda Free Lght Chn: 2.16 mg/dL (ref 0.57–2.63)

## 2015-03-12 LAB — IGG, IGA, IGM
IgA: 406 mg/dL — ABNORMAL HIGH (ref 68–379)
IgG (Immunoglobin G), Serum: 1630 mg/dL — ABNORMAL HIGH (ref 650–1600)
IgM, Serum: 19 mg/dL — ABNORMAL LOW (ref 41–251)

## 2015-03-21 ENCOUNTER — Other Ambulatory Visit: Payer: Self-pay | Admitting: Family Medicine

## 2015-04-02 DIAGNOSIS — M955 Acquired deformity of pelvis: Secondary | ICD-10-CM | POA: Diagnosis not present

## 2015-04-02 DIAGNOSIS — M9903 Segmental and somatic dysfunction of lumbar region: Secondary | ICD-10-CM | POA: Diagnosis not present

## 2015-04-02 DIAGNOSIS — M5136 Other intervertebral disc degeneration, lumbar region: Secondary | ICD-10-CM | POA: Diagnosis not present

## 2015-04-02 DIAGNOSIS — M9905 Segmental and somatic dysfunction of pelvic region: Secondary | ICD-10-CM | POA: Diagnosis not present

## 2015-04-16 ENCOUNTER — Ambulatory Visit (INDEPENDENT_AMBULATORY_CARE_PROVIDER_SITE_OTHER): Payer: Medicare Other

## 2015-04-16 ENCOUNTER — Ambulatory Visit (INDEPENDENT_AMBULATORY_CARE_PROVIDER_SITE_OTHER): Payer: Medicare Other | Admitting: Family Medicine

## 2015-04-16 VITALS — BP 136/72 | HR 72 | Temp 98.0°F | Resp 16 | Ht 75.0 in | Wt 218.0 lb

## 2015-04-16 DIAGNOSIS — J208 Acute bronchitis due to other specified organisms: Secondary | ICD-10-CM

## 2015-04-16 DIAGNOSIS — R059 Cough, unspecified: Secondary | ICD-10-CM

## 2015-04-16 DIAGNOSIS — R05 Cough: Secondary | ICD-10-CM

## 2015-04-16 MED ORDER — DOXYCYCLINE HYCLATE 100 MG PO CAPS: 100.0000 mg | ORAL_CAPSULE | Freq: Two times a day (BID) | ORAL | Status: DC

## 2015-04-16 NOTE — Patient Instructions (Signed)
Use your antibiotic twice a day with food. Let me know if you are not feeling better in the next couple of days- Sooner if worse.

## 2015-04-16 NOTE — Progress Notes (Signed)
[facility] [address], [place] [address] [phone]  Date:  04/16/2015   Name:  Michael Feeny Sr.   DOB:  01-Nov-1946   MRN:  [id number]  PCP:  Lamar Blinks, MD    Chief Complaint: Cough; Shortness of Breath; Dizziness; Fever; and Nasal Congestion   History of Present Illness:  Michael Larusso Sr. is a 69 y.o. very pleasant male patient who presents with the following:  Here today with illness.  His wife recently had bronchitis. He has noted sx himself over the last couple of weeks. He has had cough, some SOB, increased difficulty with exercise, and some "rattling in mychest." He notes that he may have to rest for a moment, but after catching his breath he "could walk 10 miles."   He went to see his GI doctor this am for pre-colonoscopy eval.  He had a temp of 99.4 at their office this am so he came in to be seen  He has had some pain under his ribs due to cough- however he denies any other sort of CP, no exertional CP.  This pain is now resolved. He was seen by cardiology last year after a couple of fainting spells. These were felt to be benign and he never had to undergo additional cardiac testing . He feels that his energy level is "still pretty good."  Patient Active Problem List   Diagnosis Date Noted  . Right bundle branch block 12/16/2014  . Syncope 12/16/2014  . Proteinuria due to type 2 diabetes mellitus 11/12/2014  . Encounter for therapeutic drug monitoring 03/13/2013  . Multiple myeloma 02/15/2013  . Edema 01/07/2013  . Finger laceration 08/21/2012  . Diabetes mellitus due to underlying condition with other diabetic kidney complication   . Hyperlipidemia   . Hypertension   . MGUS (monoclonal gammopathy of unknown significance)   . GERD (gastroesophageal reflux disease)     Past Medical History  Diagnosis Date  . Diabetes mellitus   . Hyperlipidemia   . Hypertension   . Osteoarthritis   . MGUS (monoclonal gammopathy of unknown  significance)   . GERD (gastroesophageal reflux disease)   . Anemia   . Multiple myeloma 02/2013    normal cytogenetics and FISH panel on 03/11/2013.   . Cataract   . Syncope   . Right bundle branch block     Past Surgical History  Procedure Laterality Date  . Eye surgery      History  Substance Use Topics  . Smoking status: Former Smoker -- 2.00 packs/day for 35 years    Types: Cigarettes    Start date: 03/26/1963    Quit date: 12/18/1997  . Smokeless tobacco: Never Used     Comment: quit 15 years  . Alcohol Use: No    History reviewed. No pertinent family history.  Allergies  Allergen Reactions  . Amlodipine Swelling  . Crestor [Rosuvastatin] Other (See Comments)    Per pt unable to focus    Medication list has been reviewed and updated.  Current Outpatient Prescriptions on File Prior to Visit  Medication Sig Dispense Refill  . glipiZIDE (GLUCOTROL XL) 5 MG 24 hr tablet TAKE 1 TABLET BY MOUTH TWICE A DAY (8 AM AND 10 PM) 180 tablet 0  . Glucosamine-Chondroitin-MSM (TRIPLE FLEX PO) Take 1 tablet by mouth 2 (two) times daily.     . Lancets MISC Use as instructed twice a day 100 each 2  . losartan (COZAAR) 50 MG tablet Start with  a 1/2 tablet daily and go to a whole tablet as needed (Patient taking differently: Start with a 1/2 tablet daily and go to a whole tablet as need) 90 tablet 3  . metFORMIN (GLUCOPHAGE) 1000 MG tablet TAKE 1 TABLET BY MOUTH TWICE A DAY WITH MEALS. 180 tablet 0  . metoprolol tartrate (LOPRESSOR) 25 MG tablet TAKE 1 TABLET BY MOUTH TWICE A DAY 180 tablet 3  . Multiple Vitamin (MULTIVITAMIN WITH MINERALS) TABS Take 1 tablet by mouth every morning.     No current facility-administered medications on file prior to visit.    Review of Systems:  As per HPI- otherwise negative.   Physical Examination: Filed Vitals:   04/16/15 1419  BP: 136/72  Pulse: 72  Temp: 98 F (36.7 C)  Resp: 16   Filed Vitals:   04/16/15 1419  Height: 6' 3" (1.905  m)  Weight: 218 lb (98.884 kg)   Body mass index is 27.25 kg/(m^2). Ideal Body Weight: Weight in (lb) to have BMI = 25: 199.6  GEN: WDWN, NAD, Non-toxic, A & O x 3, looks well HEENT: Atraumatic, Normocephalic. Neck supple. No masses, No LAD.  Bilateral TM wnl, oropharynx normal.  PEERL,EOMI.   Ears and Nose: No external deformity. CV: RRR, No M/G/R. No JVD. No thrill. No extra heart sounds. PULM: CTA B, no wheezes, crackles, rhonchi. No retractions. No resp. distress. No accessory muscle use. ABD: S, NT, ND, No rebound. No HSM.  Not able to reproduce rib pain at this time.  No rash to suggest shingles  EXTR: No c/c/e NEURO Normal gait.  PSYCH: Normally interactive. Conversant. Not depressed or anxious appearing.  Calm demeanor.   [facility] reading (PRIMARY) by  Dr. Lorelei Pont. CXR:  Negative  CHEST 2 VIEW  COMPARISON: 04/15/2013  FINDINGS: The heart size and mediastinal contours are within normal limits. Both lungs are clear. Mild thoracic spondylosis.  IMPRESSION: No active cardiopulmonary disease.   BP Readings from Last 3 Encounters:  04/16/15 136/72  03/09/15 166/97  12/16/14 152/88    Assessment and Plan: Cough - Plan: DG Chest 2 View, doxycycline (VIBRAMYCIN) 100 MG capsule  Acute bronchitis due to other specified organisms - Plan: doxycycline (VIBRAMYCIN) 100 MG capsule  Persistent cough for 2 weeks.  Recent exposure to illness.  Will treat with doxycycline antibiotic.  He will seek care if not feeling better soon.  Asked him to alert me if he continues to notice reduced exercise tolerance as he may need a stress test in that case.     Signed Lamar Blinks, MD

## 2015-04-28 ENCOUNTER — Encounter: Payer: Self-pay | Admitting: *Deleted

## 2015-05-03 DIAGNOSIS — D123 Benign neoplasm of transverse colon: Secondary | ICD-10-CM | POA: Diagnosis not present

## 2015-05-03 DIAGNOSIS — D126 Benign neoplasm of colon, unspecified: Secondary | ICD-10-CM | POA: Diagnosis not present

## 2015-05-03 DIAGNOSIS — Z86010 Personal history of colonic polyps: Secondary | ICD-10-CM | POA: Diagnosis not present

## 2015-05-03 DIAGNOSIS — Z09 Encounter for follow-up examination after completed treatment for conditions other than malignant neoplasm: Secondary | ICD-10-CM | POA: Diagnosis not present

## 2015-05-03 DIAGNOSIS — K5730 Diverticulosis of large intestine without perforation or abscess without bleeding: Secondary | ICD-10-CM | POA: Diagnosis not present

## 2015-05-05 DIAGNOSIS — M9905 Segmental and somatic dysfunction of pelvic region: Secondary | ICD-10-CM | POA: Diagnosis not present

## 2015-05-05 DIAGNOSIS — M955 Acquired deformity of pelvis: Secondary | ICD-10-CM | POA: Diagnosis not present

## 2015-05-05 DIAGNOSIS — M5136 Other intervertebral disc degeneration, lumbar region: Secondary | ICD-10-CM | POA: Diagnosis not present

## 2015-05-05 DIAGNOSIS — M9903 Segmental and somatic dysfunction of lumbar region: Secondary | ICD-10-CM | POA: Diagnosis not present

## 2015-05-15 ENCOUNTER — Other Ambulatory Visit: Payer: Self-pay | Admitting: Family Medicine

## 2015-06-07 ENCOUNTER — Encounter: Payer: Self-pay | Admitting: Family Medicine

## 2015-06-07 ENCOUNTER — Ambulatory Visit (INDEPENDENT_AMBULATORY_CARE_PROVIDER_SITE_OTHER): Payer: Medicare Other | Admitting: Family Medicine

## 2015-06-07 VITALS — BP 146/100 | HR 58 | Temp 97.7°F | Resp 16 | Ht 74.0 in | Wt 216.8 lb

## 2015-06-07 DIAGNOSIS — E785 Hyperlipidemia, unspecified: Secondary | ICD-10-CM | POA: Diagnosis not present

## 2015-06-07 DIAGNOSIS — E119 Type 2 diabetes mellitus without complications: Secondary | ICD-10-CM | POA: Diagnosis not present

## 2015-06-07 DIAGNOSIS — Z0000 Encounter for general adult medical examination without abnormal findings: Secondary | ICD-10-CM

## 2015-06-07 DIAGNOSIS — Z125 Encounter for screening for malignant neoplasm of prostate: Secondary | ICD-10-CM | POA: Diagnosis not present

## 2015-06-07 DIAGNOSIS — I10 Essential (primary) hypertension: Secondary | ICD-10-CM

## 2015-06-07 LAB — LIPID PANEL
CHOL/HDL RATIO: 6.2 ratio
Cholesterol: 193 mg/dL (ref 0–200)
HDL: 31 mg/dL — ABNORMAL LOW (ref >=40)
LDL Cholesterol: 128 mg/dL — ABNORMAL HIGH (ref 0–99)
Triglycerides: 168 mg/dL — ABNORMAL HIGH (ref <150)
VLDL: 34 mg/dL (ref 0–40)

## 2015-06-07 LAB — COMPREHENSIVE METABOLIC PANEL
ALBUMIN: 4.1 g/dL (ref 3.5–5.2)
ALT: 18 U/L (ref 0–53)
AST: 16 U/L (ref 0–37)
Alkaline Phosphatase: 42 U/L (ref 39–117)
BUN: 16 mg/dL (ref 6–23)
CHLORIDE: 101 meq/L (ref 96–112)
CO2: 28 mEq/L (ref 19–32)
Calcium: 9.8 mg/dL (ref 8.4–10.5)
Creat: 0.94 mg/dL (ref 0.50–1.35)
Glucose, Bld: 202 mg/dL — ABNORMAL HIGH (ref 70–99)
POTASSIUM: 4.3 meq/L (ref 3.5–5.3)
Sodium: 135 mEq/L (ref 135–145)
Total Bilirubin: 0.4 mg/dL (ref 0.2–1.2)
Total Protein: 7.6 g/dL (ref 6.0–8.3)

## 2015-06-07 LAB — MICROALBUMIN, URINE: Microalb, Ur: 5.2 mg/dL — ABNORMAL HIGH (ref <2.0)

## 2015-06-07 LAB — POCT GLYCOSYLATED HEMOGLOBIN (HGB A1C): HEMOGLOBIN A1C: 8.9

## 2015-06-07 MED ORDER — LOSARTAN POTASSIUM 50 MG PO TABS: ORAL_TABLET | ORAL | Status: DC

## 2015-06-07 MED ORDER — GLIPIZIDE ER 5 MG PO TB24: ORAL_TABLET | ORAL | Status: DC

## 2015-06-07 MED ORDER — METFORMIN HCL 1000 MG PO TABS: ORAL_TABLET | ORAL | Status: DC

## 2015-06-07 MED ORDER — METOPROLOL TARTRATE 25 MG PO TABS: 25.0000 mg | ORAL_TABLET | Freq: Two times a day (BID) | ORAL | Status: DC

## 2015-06-07 NOTE — Patient Instructions (Signed)
I will be in touch with your labs asap Your blood pressure if a little bit up- take the losartan regularly if you can Please get your shingles shot when you can If you will make an appt (or just come to the walk- in) we can remove the nodule from your back at your convenience

## 2015-06-07 NOTE — Progress Notes (Signed)
[facility] [address], [place] [address] [phone]  Date:  06/07/2015   Name:  Michael Micucci Sr.   DOB:  09/19/1946   MRN:  [id number]  PCP:  Lamar Blinks, MD    Chief Complaint: Annual Exam   History of Present Illness:  Michael Alperin Sr. is a 69 y.o. very pleasant male patient who presents with the following:  Here today for a CPE.  He has a history of syncope, proteinuria, DM, HTN, hyperlipidemia.  Most recent A1c showed ok control  He was evaluated by Dr. Gwenlyn Found last year after episodes of near syncope; this was thought to be due to orthostasis and further w/u was not thought to be necessary.  He has not had any episodes recently but admits he is not taking his losartan all the time.  He plans to start taking this more regularly as his BP is running a bit high  BP Readings from Last 3 Encounters:  06/07/15 146/100  04/16/15 136/72  03/09/15 166/97   He is doing some outside work right now and tends to get quite hot and worn out from this work.  He also notes a little nodule on his back which is getting to be "a little touchy" and he is not sure if anything needs to be done with it.  He has not yet had zostavax  Last PSA 2/15- ok  Lab Results  Component Value Date   HGBA1C 7.4 09/21/2014   Patient Active Problem List   Diagnosis Date Noted  . Right bundle branch block 12/16/2014  . Syncope 12/16/2014  . Proteinuria due to type 2 diabetes mellitus 11/12/2014  . Encounter for therapeutic drug monitoring 03/13/2013  . Multiple myeloma 02/15/2013  . Edema 01/07/2013  . Finger laceration 08/21/2012  . Diabetes mellitus due to underlying condition with other diabetic kidney complication   . Hyperlipidemia   . Hypertension   . MGUS (monoclonal gammopathy of unknown significance)   . GERD (gastroesophageal reflux disease)     Past Medical History  Diagnosis Date  . Diabetes mellitus   . Hyperlipidemia   . Hypertension   .  Osteoarthritis   . MGUS (monoclonal gammopathy of unknown significance)   . GERD (gastroesophageal reflux disease)   . Anemia   . Multiple myeloma 02/2013    normal cytogenetics and FISH panel on 03/11/2013.   . Cataract   . Syncope   . Right bundle branch block   . Bundle branch block, right     Past Surgical History  Procedure Laterality Date  . Eye surgery      History  Substance Use Topics  . Smoking status: Former Smoker -- 2.00 packs/day for 35 years    Types: Cigarettes    Start date: 03/26/1963    Quit date: 12/18/1997  . Smokeless tobacco: Never Used     Comment: quit 15 years  . Alcohol Use: No    Family History  Problem Relation Age of Onset  . Hypertension Mother   . Cancer Mother   . Stroke Mother   . Hypertension Sister   . Hypertension Brother   . Hypertension Maternal Grandmother     Allergies  Allergen Reactions  . Amlodipine Swelling  . Crestor [Rosuvastatin] Other (See Comments)    Per pt unable to focus    Medication list has been reviewed and updated.  Current Outpatient Prescriptions on File Prior to Visit  Medication Sig Dispense Refill  . doxycycline (VIBRAMYCIN) 100  MG capsule Take 1 capsule (100 mg total) by mouth 2 (two) times daily. 20 capsule 0  . glipiZIDE (GLUCOTROL XL) 5 MG 24 hr tablet TAKE 1 TABLET BY MOUTH TWICE A DAY (8 AM AND 10 PM) 180 tablet 0  . Glucosamine-Chondroitin-MSM (TRIPLE FLEX PO) Take 1 tablet by mouth 2 (two) times daily.     . Lancets MISC Use as instructed twice a day 100 each 2  . losartan (COZAAR) 50 MG tablet Start with a 1/2 tablet daily and go to a whole tablet as needed (Patient taking differently: Start with a 1/2 tablet daily and go to a whole tablet as need) 90 tablet 3  . metFORMIN (GLUCOPHAGE) 1000 MG tablet TAKE 1 TABLET BY MOUTH TWICE A DAY WITH MEALS. 180 tablet 0  . metoprolol tartrate (LOPRESSOR) 25 MG tablet TAKE 1 TABLET BY MOUTH TWICE A DAY 180 tablet 3  . Multiple Vitamin (MULTIVITAMIN WITH  MINERALS) TABS Take 1 tablet by mouth every morning.     No current facility-administered medications on file prior to visit.    Review of Systems:  As per HPI- otherwise negative.  He has noted some weight loss recently- he is not upset about this loss but is not sure if he should be concerned.  Admits that he is not eating as much recently because food just does not seem to taste as good  Wt Readings from Last 3 Encounters:  06/07/15 216 lb 12.8 oz (98.34 kg)  04/16/15 218 lb (98.884 kg)  03/09/15 224 lb (101.606 kg)    Physical Examination: Filed Vitals:   06/07/15 0842  BP: 146/100  Pulse: 58  Temp: 97.7 F (36.5 C)  Resp: 16   Filed Vitals:   06/07/15 0842  Height: _0  (1.88 m)  Weight: 216 lb 12.8 oz (98.34 kg)   Body mass index is 27.82 kg/(m^2). Ideal Body Weight: Weight in (lb) to have BMI = 25: 194.3  GEN: WDWN, NAD, Non-toxic, A & O x 3, mild overweight, looks well HEENT: Atraumatic, Normocephalic. Neck supple. No masses, No LAD.  Bilateral TM wnl, oropharynx normal.  PEERL,EOMI.   Ears and Nose: No external deformity. CV: RRR, No M/G/R. No JVD. No thrill. No extra heart sounds. PULM: CTA B, no wheezes, crackles, rhonchi. No retractions. No resp. distress. No accessory muscle use. ABD: S, NT, ND, +BS. No rebound. No HSM. EXTR: No c/c/e NEURO Normal gait.  PSYCH: Normally interactive. Conversant. Not depressed or anxious appearing.  Calm demeanor.  GU: normal testes and penis, normal DRE He has a pedunculated fibroma on his back- right sided thoracic area Assessment and Plan: Physical exam  Diabetes mellitus type 2, controlled - Plan: Microalbumin, urine, Comprehensive metabolic panel, metFORMIN (GLUCOPHAGE) 1000 MG tablet, glipiZIDE (GLUCOTROL XL) 5 MG 24 hr tablet, losartan (COZAAR) 50 MG tablet, POCT glycosylated hemoglobin (Hb A1C)  Hyperlipidemia - Plan: Lipid panel  Essential hypertension - Plan: losartan (COZAAR) 50 MG tablet, metoprolol tartrate  (LOPRESSOR) 25 MG tablet  Screening for prostate cancer - Plan: PSA  Await labs, screening updated as above.   Advised that some lack of appetite is common in older people but we do need to keep an eye out for continued weight loss.  Asked him to weigh himself once a week and report any continued change.  Also notes smoking history and will suggest screening CT scan  See mychart comments about the rest of his labs below  Results for orders placed or performed in visit on  06/07/15  Microalbumin, urine  Result Value Ref Range   Microalb, Ur 5.2 (H) <2.0 mg/dL  Comprehensive metabolic panel  Result Value Ref Range   Sodium 135 135 - 145 mEq/L   Potassium 4.3 3.5 - 5.3 mEq/L   Chloride 101 96 - 112 mEq/L   CO2 28 19 - 32 mEq/L   Glucose, Bld 202 (H) 70 - 99 mg/dL   BUN 16 6 - 23 mg/dL   Creat 0.94 0.50 - 1.35 mg/dL   Total Bilirubin 0.4 0.2 - 1.2 mg/dL   Alkaline Phosphatase 42 39 - 117 U/L   AST 16 0 - 37 U/L   ALT 18 0 - 53 U/L   Total Protein 7.6 6.0 - 8.3 g/dL   Albumin 4.1 3.5 - 5.2 g/dL   Calcium 9.8 8.4 - 10.5 mg/dL  Lipid panel  Result Value Ref Range   Cholesterol 193 0 - 200 mg/dL   Triglycerides 168 (H) <150 mg/dL   HDL 31 (L) >=40 mg/dL   Total CHOL/HDL Ratio 6.2 Ratio   VLDL 34 0 - 40 mg/dL   LDL Cholesterol 128 (H) 0 - 99 mg/dL  PSA  Result Value Ref Range   PSA  <=4.00 ng/mL  POCT glycosylated hemoglobin (Hb A1C)  Result Value Ref Range   Hemoglobin A1C 8.9      Signed Lamar Blinks, MD

## 2015-06-08 LAB — PSA: PSA: 2.87 ng/mL (ref <=4.00)

## 2015-06-10 DIAGNOSIS — M9905 Segmental and somatic dysfunction of pelvic region: Secondary | ICD-10-CM | POA: Diagnosis not present

## 2015-06-10 DIAGNOSIS — M5136 Other intervertebral disc degeneration, lumbar region: Secondary | ICD-10-CM | POA: Diagnosis not present

## 2015-06-10 DIAGNOSIS — M9903 Segmental and somatic dysfunction of lumbar region: Secondary | ICD-10-CM | POA: Diagnosis not present

## 2015-06-10 DIAGNOSIS — M955 Acquired deformity of pelvis: Secondary | ICD-10-CM | POA: Diagnosis not present

## 2015-06-16 ENCOUNTER — Other Ambulatory Visit: Payer: Self-pay | Admitting: Physician Assistant

## 2015-06-28 ENCOUNTER — Ambulatory Visit (INDEPENDENT_AMBULATORY_CARE_PROVIDER_SITE_OTHER): Payer: Medicare Other | Admitting: Family Medicine

## 2015-06-28 ENCOUNTER — Ambulatory Visit (INDEPENDENT_AMBULATORY_CARE_PROVIDER_SITE_OTHER): Payer: Medicare Other

## 2015-06-28 VITALS — BP 180/110 | HR 74 | Temp 97.6°F | Resp 16 | Ht 74.0 in | Wt 215.0 lb

## 2015-06-28 DIAGNOSIS — R938 Abnormal findings on diagnostic imaging of other specified body structures: Secondary | ICD-10-CM

## 2015-06-28 DIAGNOSIS — D492 Neoplasm of unspecified behavior of bone, soft tissue, and skin: Secondary | ICD-10-CM | POA: Diagnosis not present

## 2015-06-28 DIAGNOSIS — R9389 Abnormal findings on diagnostic imaging of other specified body structures: Secondary | ICD-10-CM

## 2015-06-28 DIAGNOSIS — M25511 Pain in right shoulder: Secondary | ICD-10-CM | POA: Diagnosis not present

## 2015-06-28 MED ORDER — HYDROCODONE-ACETAMINOPHEN 5-325 MG PO TABS: 1.0000 | ORAL_TABLET | Freq: Four times a day (QID) | ORAL | Status: DC | PRN

## 2015-06-28 NOTE — Patient Instructions (Addendum)
There is a possible abnormal area on the outside part of your collarbone. I would like to see the reading from the radiologist, but for now can use shoulder sling, hydrocodone up to every 4-6 hours as needed, and follow-up with Dr. Everlene Farrier on Wednesday. We may need to do a CT scan or MRI of your shoulder for further evaluation of this area. If you have any fevers, chills or any worsening of symptoms return sooner.  Avoid use of the right shoulder for now until we can recheck it on Wednesday.   Make sure you take her blood pressure medicine as prescribed.. Your blood pressure was elevated today, so we'll hold off on anti-inflammatory medicines until it is a little better controlled. Can discuss this with Dr. Everlene Farrier in 2 days  Return to the clinic or go to the nearest emergency room if any of your symptoms worsen or new symptoms occur.

## 2015-06-28 NOTE — Progress Notes (Signed)
Subjective:    Patient ID: Michael Rokosz Sr., male    DOB: Dec 10, 1946, 69 y.o.   MRN: [id number] This chart was scribed for Meredith Staggers, MD by Littie Deeds, Medical Scribe. This patient was seen in Room 10 and the patient's care was started at 11:53 AM.    HPI HPI Comments: Michael Fergusson Sr. is a 69 y.o. left hand dominant male with a history of multiple myeloma, hypertension, and DM who presents to the [facility] complaining of sudden onset, worsening right shoulder pain that started 10 days ago after he felt a pull to his right shoulder while he was changing out an old water heater, but worsened 3-4 days ago. He describes the pain as "tremendous." He has been able to use his shoulder for work and has continued working with his right shoulder. 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 The pain has radiated to the right side of his neck, although he describes neck stiffness rather than neck pain. Patient also reports having partial numbness to all the fingers of his right hand that started yesterday. He has had difficulty sleeping due to the pain. He has tried Advil, once a day for the past few days, but without relief. Patient denies chest pain, SOB, weakness, headache and slurred speech. He was seen by Dr. Perrin Maltese about 12 years ago for bursitis in both of his shoulders. He did not have any injections or surgery at that time.  He was seen approximately 3 weeks ago for annual physical. Blood pressure was noted to be elevated at that visit, but he had not been taking losartan all the time. Advised to take it each day. He is taking the losartan intermittently and metoprolol every day. He has not taken his blood pressure medications this morning yet. He has been getting an average of 140s systolic blood pressure in the mornings which typically goes down to 120s-130s in the afternoons.   Patient Active Problem List   Diagnosis Date Noted  . Right bundle branch block  12/16/2014  . Syncope 12/16/2014  . Proteinuria due to type 2 diabetes mellitus 11/12/2014  . Encounter for therapeutic drug monitoring 03/13/2013  . Multiple myeloma 02/15/2013  . Edema 01/07/2013  . Finger laceration 08/21/2012  . Diabetes mellitus due to underlying condition with other diabetic kidney complication   . Hyperlipidemia   . Hypertension   . MGUS (monoclonal gammopathy of unknown significance)   . GERD (gastroesophageal reflux disease)    Past Medical History  Diagnosis Date  . Diabetes mellitus   . Hyperlipidemia   . Hypertension   . Osteoarthritis   . MGUS (monoclonal gammopathy of unknown significance)   . GERD (gastroesophageal reflux disease)   . Anemia   . Multiple myeloma 02/2013    normal cytogenetics and FISH panel on 03/11/2013.   . Cataract   . Syncope   . Right bundle branch block   . Bundle branch block, right    Past Surgical History  Procedure Laterality Date  . Eye surgery     Allergies  Allergen Reactions  . Amlodipine Swelling  . Crestor [Rosuvastatin] Other (See Comments)    Per pt unable to focus   Prior to Admission medications   Medication Sig Start Date End Date Taking? Authorizing Provider  glipiZIDE (GLUCOTROL XL) 5 MG 24 hr tablet TAKE 1 TABLET BY MOUTH TWICE A DAY (8 AM AND 10 PM) 06/07/15  Yes Pearline Cables, MD  Glucosamine-Chondroitin-MSM (TRIPLE FLEX PO) Take 1 tablet by mouth 2 (two) times daily.    Yes Historical Provider, MD  Lancets MISC Use as instructed twice a day 01/31/14  Yes Heather M Marte, PA-C  losartan (COZAAR) 50 MG tablet Start with a 1/2 tablet daily and go to a whole tablet as needed 06/07/15  Yes [name] [name], MD  metFORMIN (GLUCOPHAGE) 1000 MG tablet TAKE 1 TABLET BY MOUTH TWICE A DAY WITH MEALS. 06/07/15  Yes [name] [name], MD  metoprolol tartrate (LOPRESSOR) 25 MG tablet Take 1 tablet (25 mg total) by mouth 2 (two) times daily. 06/07/15  Yes [name] [name], MD  Multiple Vitamin (MULTIVITAMIN  WITH MINERALS) TABS Take 1 tablet by mouth every morning.   Yes Historical Provider, MD   History   Social History  . Marital Status: Married    Spouse Name: N/A  . Number of Children: N/A  . Years of Education: N/A   Occupational History  . Not on file.   Social History Main Topics  . Smoking status: Former Smoker -- 2.00 packs/day for 35 years    Types: Cigarettes    Start date: 03/26/1963    Quit date: 12/18/1997  . Smokeless tobacco: Never Used     Comment: quit 15 years  . Alcohol Use: No  . Drug Use: No  . Sexual Activity: Yes    Birth Control/ Protection: None   Other Topics Concern  . Not on file   Social History Narrative   Married   Building Maintenance        Review of Systems  Respiratory: Negative for shortness of breath.   Cardiovascular: Negative for chest pain.  Musculoskeletal: Positive for arthralgias and neck stiffness. Negative for neck pain.  Neurological: Positive for numbness. Negative for speech difficulty, weakness and headaches.       Objective:   Physical Exam  Constitutional: He is oriented to person, place, and time. He appears well-developed and well-nourished.  HENT:  Head: Normocephalic and atraumatic.  Eyes: EOM are normal. Pupils are equal, round, and reactive to light.  Neck: Normal range of motion. No JVD present. Muscular tenderness present. No spinous process tenderness present. Carotid bruit is not present.  C-spine with full ROM, non-tender at the midline. Spasm and tenderness in right paraspinals into trapezius.   Cardiovascular: Normal rate, regular rhythm and normal heart sounds.   No murmur heard. Pulses:      Radial pulses are 2+ on the right side.  Capillary refill < 1 second at fingertips, which are all warm.  Pulmonary/Chest: Effort normal and breath sounds normal. He has no rales.  Musculoskeletal: He exhibits tenderness. He exhibits no edema.  Right shoulder: Baca joint non-tender. Clavicle slightly tender along  distal aspect to acromion. Diffusely tender along anterior and lateral upper shoulder with slight fullness appreciated anteriorly. Guarded with active ROM with only approximately 20-30 degrees of flexion. Guarded with abduction. Internal rotation is equal to the left side. Passive ROM approximately 90 degrees of flexion. 50-60 degrees abduction with passive ROM but still some guarding.   Right arm: Tender along proximal biceps and biceps tendon. Elbow with full ROM, no bony tenderness. Wrist with full ROM, no bony tenderness.  Neurological: He is alert and oriented to person, place, and time.  Normal grip strength in right hand.  Skin: Skin is warm and dry.  Psychiatric: He has a normal mood and affect.  Vitals reviewed.  [facility] (PRIMARY) x-ray report read by Dr. Carlota Raspberry:  Right Shoulder - Calcific changes superolateral to humeral head without discrete fracture. Distal clavicle with cystic change vs. mass. Calcified area middle aspect of clavicle.   Filed Vitals:   06/28/15 1040 06/28/15 1150  BP: 178/108 180/110  Pulse: 74   Temp: 97.6 F (36.4 C)   TempSrc: Oral   Resp: 16   Height: $Remove'6\' 2"'VoryTnd$  (1.88 m)   Weight: 215 lb (97.523 kg)   SpO2: 99%        Assessment & Plan:   Michael Vensel Sr. is a 69 y.o. male Pain in joint, shoulder region, right - Plan: DG Shoulder Right, HYDROcodone-acetaminophen (NORCO/VICODIN) 5-325 MG per tablet  Abnormal growth of clavicle  Abnormal x-ray  Possible initial injury with moving heavy water heater, with subsequent increasing pain suggest possible rotator cuff syndrome, or subacromial bursitis.  However with history of multiple myeloma and questionable irregularity of distal clavicle, was placed in shoulder sling, hydrocodone if needed for pain, and follow-up in the next few days with Dr. Everlene Farrier. Could consider subacromial injection for bursitis/RTC syndrome, MRI, or possible eval with orthopedics.  X-ray over read noted: IMPRESSION: 1. Acromioclavicular  glenohumeral degenerative change.  2. Calcific supraspinatus tendinitis.  Meds ordered this encounter  Medications  . HYDROcodone-acetaminophen (NORCO/VICODIN) 5-325 MG per tablet    Sig: Take 1 tablet by mouth every 6 (six) hours as needed for moderate pain.    Dispense:  20 tablet    Refill:  0   Patient Instructions  There is a possible abnormal area on the outside part of your collarbone. I would like to see the reading from the radiologist, but for now can use shoulder sling, hydrocodone up to every 4-6 hours as needed, and follow-up with Dr. Everlene Farrier on Wednesday. We may need to do a CT scan or MRI of your shoulder for further evaluation of this area. If you have any fevers, chills or any worsening of symptoms return sooner.  Avoid use of the right shoulder for now until we can recheck it on Wednesday.   Make sure you take her blood pressure medicine as prescribed.. Your blood pressure was elevated today, so we'll hold off on anti-inflammatory medicines until it is a little better controlled. Can discuss this with Dr. Everlene Farrier in 2 days  Return to the clinic or go to the nearest emergency room if any of your symptoms worsen or new symptoms occur.     I personally performed the services described in this documentation, which was scribed in my presence. The recorded information has been reviewed and considered, and addended by me as needed.

## 2015-07-08 ENCOUNTER — Telehealth: Payer: Self-pay | Admitting: Hematology & Oncology

## 2015-07-08 NOTE — Telephone Encounter (Signed)
Pt called to reschedule appt. Pt confirmed 8/22

## 2015-07-09 ENCOUNTER — Ambulatory Visit: Payer: Medicare Other | Admitting: Hematology & Oncology

## 2015-07-09 ENCOUNTER — Other Ambulatory Visit: Payer: Medicare Other

## 2015-07-31 ENCOUNTER — Ambulatory Visit (INDEPENDENT_AMBULATORY_CARE_PROVIDER_SITE_OTHER): Payer: Medicare Other | Admitting: Emergency Medicine

## 2015-07-31 VITALS — BP 138/94 | HR 74 | Temp 97.8°F | Ht 74.0 in | Wt 213.6 lb

## 2015-07-31 DIAGNOSIS — R55 Syncope and collapse: Secondary | ICD-10-CM | POA: Diagnosis not present

## 2015-07-31 DIAGNOSIS — E0829 Diabetes mellitus due to underlying condition with other diabetic kidney complication: Secondary | ICD-10-CM | POA: Diagnosis not present

## 2015-07-31 LAB — COMPREHENSIVE METABOLIC PANEL WITH GFR
ALT: 17 U/L (ref 9–46)
AST: 14 U/L (ref 10–35)
Albumin: 4 g/dL (ref 3.6–5.1)
Alkaline Phosphatase: 57 U/L (ref 40–115)
BUN: 10 mg/dL (ref 7–25)
CO2: 24 mmol/L (ref 20–31)
Calcium: 9.6 mg/dL (ref 8.6–10.3)
Chloride: 98 mmol/L (ref 98–110)
Creat: 1.01 mg/dL (ref 0.70–1.25)
Glucose, Bld: 294 mg/dL — ABNORMAL HIGH (ref 65–99)
Potassium: 4.5 mmol/L (ref 3.5–5.3)
Sodium: 135 mmol/L (ref 135–146)
Total Bilirubin: 0.5 mg/dL (ref 0.2–1.2)
Total Protein: 7.6 g/dL (ref 6.1–8.1)

## 2015-07-31 LAB — LIPID PANEL
Cholesterol: 199 mg/dL (ref 125–200)
HDL: 32 mg/dL — ABNORMAL LOW
LDL Cholesterol: 135 mg/dL — ABNORMAL HIGH
Total CHOL/HDL Ratio: 6.2 ratio — ABNORMAL HIGH
Triglycerides: 161 mg/dL — ABNORMAL HIGH
VLDL: 32 mg/dL — ABNORMAL HIGH

## 2015-07-31 LAB — POCT URINALYSIS DIPSTICK
Bilirubin, UA: NEGATIVE
Blood, UA: NEGATIVE
Glucose, UA: 500
Ketones, UA: NEGATIVE
Leukocytes, UA: NEGATIVE
Nitrite, UA: NEGATIVE
Protein, UA: 100
Spec Grav, UA: 1.015
Urobilinogen, UA: 0.2
pH, UA: 5.5

## 2015-07-31 LAB — POCT UA - MICROSCOPIC ONLY
BACTERIA, U MICROSCOPIC: NEGATIVE
Casts, Ur, LPF, POC: NEGATIVE
Crystals, Ur, HPF, POC: NEGATIVE
Mucus, UA: NEGATIVE
RBC, URINE, MICROSCOPIC: NEGATIVE
WBC, Ur, HPF, POC: NEGATIVE
Yeast, UA: NEGATIVE

## 2015-07-31 LAB — CBC
HCT: 38.1 % — ABNORMAL LOW (ref 39.0–52.0)
Hemoglobin: 12.5 g/dL — ABNORMAL LOW (ref 13.0–17.0)
MCH: 28.4 pg (ref 26.0–34.0)
MCHC: 32.8 g/dL (ref 30.0–36.0)
MCV: 86.6 fL (ref 78.0–100.0)
MPV: 11.1 fL (ref 8.6–12.4)
Platelets: 236 10*3/uL (ref 150–400)
RBC: 4.4 MIL/uL (ref 4.22–5.81)
RDW: 14.1 % (ref 11.5–15.5)
WBC: 6.6 10*3/uL (ref 4.0–10.5)

## 2015-07-31 LAB — POCT GLYCOSYLATED HEMOGLOBIN (HGB A1C): Hemoglobin A1C: 9.6

## 2015-07-31 LAB — MICROALBUMIN, URINE: Microalb, Ur: 23.3 mg/dL — ABNORMAL HIGH (ref <2.0)

## 2015-07-31 MED ORDER — INSULIN PEN NEEDLE 31G X 5 MM MISC: 1.0000 [IU] | Freq: Every day | Status: DC

## 2015-07-31 MED ORDER — LIRAGLUTIDE 18 MG/3ML ~~LOC~~ SOPN: PEN_INJECTOR | SUBCUTANEOUS | Status: DC

## 2015-07-31 NOTE — Progress Notes (Signed)
Subjective:  Patient ID: Michael Witucki Sr., male    DOB: June 03, 1946  Age: 69 y.o. MRN: [id number]  CC: Shortness of Breath; Hypotension; Diabetes; and Dizziness   HPI Michael Koeppen Sr. presents  with increasingly poorly controlled type 2 diabetes. He said that he's had several episodes of dizziness and one episode of syncope this week. He has increasing worsening shortness of breath. This is with exertion such as going up steps. He denies any chest pain tightness heaviness pressure. Said that when he exerts himself he gets short of breath and dizzy. Has no peripheral numbness tingling or weakness. No double vision or blurred vision. Said that his blood sugar is been falling out of control. He was satisfied that his daily fasting sugars were running in the 140-170 range and now they're consistently over 200.  History Michael Wiggins has a past medical history of Diabetes mellitus; Hyperlipidemia; Hypertension; Osteoarthritis; MGUS (monoclonal gammopathy of unknown significance); GERD (gastroesophageal reflux disease); Anemia; Multiple myeloma (02/2013); Cataract; Syncope; Right bundle branch block; and Bundle branch block, right.   He has past surgical history that includes Eye surgery.   His  family history includes Cancer in his mother; Hypertension in his brother, maternal grandmother, mother, and sister; Stroke in his mother.  He   reports that he quit smoking about 17 years ago. His smoking use included Cigarettes. He started smoking about 52 years ago. He has a 70 pack-year smoking history. He has never used smokeless tobacco. He reports that he does not drink alcohol or use illicit drugs.  Outpatient Prescriptions Prior to Visit  Medication Sig Dispense Refill  . Glucosamine-Chondroitin-MSM (TRIPLE FLEX PO) Take 1 tablet by mouth 2 (two) times daily.     Marland Kitchen HYDROcodone-acetaminophen (NORCO/VICODIN) 5-325 MG per tablet Take 1 tablet by mouth every 6 (six) hours as needed for moderate pain. 20  tablet 0  . Lancets MISC Use as instructed twice a day 100 each 2  . losartan (COZAAR) 50 MG tablet Start with a 1/2 tablet daily and go to a whole tablet as needed 90 tablet 3  . metFORMIN (GLUCOPHAGE) 1000 MG tablet TAKE 1 TABLET BY MOUTH TWICE A DAY WITH MEALS. 180 tablet 3  . metoprolol tartrate (LOPRESSOR) 25 MG tablet Take 1 tablet (25 mg total) by mouth 2 (two) times daily. 180 tablet 3  . Multiple Vitamin (MULTIVITAMIN WITH MINERALS) TABS Take 1 tablet by mouth every morning.    Marland Kitchen glipiZIDE (GLUCOTROL XL) 5 MG 24 hr tablet TAKE 1 TABLET BY MOUTH TWICE A DAY (8 AM AND 10 PM) 180 tablet 3   No facility-administered medications prior to visit.    Social History   Social History  . Marital Status: Married    Spouse Name: N/A  . Number of Children: N/A  . Years of Education: N/A   Social History Main Topics  . Smoking status: Former Smoker -- 2.00 packs/day for 35 years    Types: Cigarettes    Start date: 03/26/1963    Quit date: 12/18/1997  . Smokeless tobacco: Never Used     Comment: quit 15 years  . Alcohol Use: No  . Drug Use: No  . Sexual Activity: Yes    Birth Control/ Protection: None   Other Topics Concern  . None   Social History Narrative   Married   Building Maintenance        Review of Systems  Constitutional: Negative for fever, chills and appetite change.  HENT: Negative for congestion, ear pain,  postnasal drip, sinus pressure and sore throat.   Eyes: Negative for pain and redness.  Respiratory: Negative for cough, shortness of breath and wheezing.   Cardiovascular: Negative for leg swelling.  Gastrointestinal: Negative for nausea, vomiting, abdominal pain, diarrhea, constipation and blood in stool.  Endocrine: Negative for polyuria.  Genitourinary: Negative for dysuria, urgency, frequency and flank pain.  Musculoskeletal: Negative for gait problem.  Skin: Negative for rash.  Neurological: Negative for weakness and headaches.    Psychiatric/Behavioral: Negative for confusion and decreased concentration. The patient is not nervous/anxious.     Objective:  BP 138/94 mmHg  Pulse 74  Temp(Src) 97.8 F (36.6 C) (Oral)  Ht _0  (1.88 m)  Wt 213 lb 9.6 oz (96.888 kg)  BMI 27.41 kg/m2  SpO2 98%  Physical Exam  Constitutional: He is oriented to person, place, and time. He appears well-developed and well-nourished. No distress.  HENT:  Head: Normocephalic and atraumatic.  Right Ear: External ear normal.  Left Ear: External ear normal.  Nose: Nose normal.  Eyes: Conjunctivae and EOM are normal. Pupils are equal, round, and reactive to light. No scleral icterus.  Neck: Normal range of motion. Neck supple. No tracheal deviation present.  Cardiovascular: Normal rate, regular rhythm and normal heart sounds.   Pulmonary/Chest: Effort normal. No respiratory distress. He has no wheezes. He has no rales.  Abdominal: He exhibits no mass. There is no tenderness. There is no rebound and no guarding.  Musculoskeletal: He exhibits no edema.  Lymphadenopathy:    He has no cervical adenopathy.  Neurological: He is alert and oriented to person, place, and time. Coordination normal.  Skin: Skin is warm and dry. No rash noted.  Psychiatric: He has a normal mood and affect. His behavior is normal.      Assessment & Plan:   Michael Wiggins was seen today for shortness of breath, hypotension, diabetes and dizziness.  Diagnoses and all orders for this visit:  Diabetes mellitus due to underlying condition with other diabetic kidney complication -     POCT glycosylated hemoglobin (Hb A1C) -     POCT UA - Microscopic Only -     POCT urinalysis dipstick -     CBC -     Comprehensive metabolic panel -     Microalbumin, urine -     Lipid panel -     EKG 12-Lead -     MR Brain Wo Contrast; Future  Syncope and collapse -     EKG 12-Lead -     MR Brain Wo Contrast; Future  Other orders -     Liraglutide 18 MG/3ML SOPN; Inject 0.6 mg  daily for one week then increase to 1.2 mg daily for one week finally increased to 1.8 following that  second week -     Insulin Pen Needle 31G X 5 MM MISC; 1 Units by Does not apply route daily.   I have discontinued Mr. Alben Spittle glipiZIDE. I am also having him start on Liraglutide and Insulin Pen Needle. Additionally, I am having him maintain his Glucosamine-Chondroitin-MSM (TRIPLE FLEX PO), multivitamin with minerals, Lancets, metFORMIN, losartan, metoprolol tartrate, and HYDROcodone-acetaminophen.  Meds ordered this encounter  Medications  . Liraglutide 18 MG/3ML SOPN    Sig: Inject 0.6 mg daily for one week then increase to 1.2 mg daily for one week finally increased to 1.8 following that  second week    Dispense:  3 pen    Refill:  5  . Insulin Pen Needle 31G  X 5 MM MISC    Sig: 1 Units by Does not apply route daily.    Dispense:  100 each    Refill:  5   He was asked here but daily blood pressure and blood sugar along and will follow-up in in a month  Appropriate red flag conditions were discussed with the patient as well as actions that should be taken.  Patient expressed his understanding.  Follow-up: No Follow-up on file.  Roselee Culver, MD

## 2015-08-04 ENCOUNTER — Other Ambulatory Visit: Payer: Self-pay | Admitting: Family Medicine

## 2015-08-08 ENCOUNTER — Emergency Department (HOSPITAL_COMMUNITY): Payer: Medicare Other

## 2015-08-08 ENCOUNTER — Ambulatory Visit (INDEPENDENT_AMBULATORY_CARE_PROVIDER_SITE_OTHER): Payer: Medicare Other

## 2015-08-08 ENCOUNTER — Inpatient Hospital Stay (HOSPITAL_COMMUNITY)
Admission: EM | Admit: 2015-08-08 | Discharge: 2015-08-10 | DRG: 243 | Disposition: A | Discharge status: Home / Self Care | Payer: Medicare Other | Attending: Cardiovascular Disease | Admitting: Cardiovascular Disease

## 2015-08-08 ENCOUNTER — Encounter (HOSPITAL_COMMUNITY): Payer: Self-pay | Admitting: Emergency Medicine

## 2015-08-08 ENCOUNTER — Ambulatory Visit (INDEPENDENT_AMBULATORY_CARE_PROVIDER_SITE_OTHER): Payer: Medicare Other | Admitting: Family Medicine

## 2015-08-08 VITALS — BP 124/72 | HR 44 | Temp 97.6°F | Resp 18 | Ht 74.0 in | Wt 217.0 lb

## 2015-08-08 DIAGNOSIS — R001 Bradycardia, unspecified: Secondary | ICD-10-CM

## 2015-08-08 DIAGNOSIS — Z7982 Long term (current) use of aspirin: Secondary | ICD-10-CM | POA: Diagnosis not present

## 2015-08-08 DIAGNOSIS — D509 Iron deficiency anemia, unspecified: Secondary | ICD-10-CM | POA: Diagnosis not present

## 2015-08-08 DIAGNOSIS — I4510 Unspecified right bundle-branch block: Secondary | ICD-10-CM | POA: Diagnosis not present

## 2015-08-08 DIAGNOSIS — R55 Syncope and collapse: Secondary | ICD-10-CM

## 2015-08-08 DIAGNOSIS — R0602 Shortness of breath: Secondary | ICD-10-CM | POA: Diagnosis present

## 2015-08-08 DIAGNOSIS — I459 Conduction disorder, unspecified: Secondary | ICD-10-CM | POA: Diagnosis not present

## 2015-08-08 DIAGNOSIS — Z0389 Encounter for observation for other suspected diseases and conditions ruled out: Secondary | ICD-10-CM | POA: Diagnosis not present

## 2015-08-08 DIAGNOSIS — K219 Gastro-esophageal reflux disease without esophagitis: Secondary | ICD-10-CM | POA: Diagnosis present

## 2015-08-08 DIAGNOSIS — Z823 Family history of stroke: Secondary | ICD-10-CM

## 2015-08-08 DIAGNOSIS — I442 Atrioventricular block, complete: Secondary | ICD-10-CM | POA: Diagnosis not present

## 2015-08-08 DIAGNOSIS — R404 Transient alteration of awareness: Secondary | ICD-10-CM | POA: Diagnosis not present

## 2015-08-08 DIAGNOSIS — I441 Atrioventricular block, second degree: Principal | ICD-10-CM | POA: Diagnosis present

## 2015-08-08 DIAGNOSIS — Z87891 Personal history of nicotine dependence: Secondary | ICD-10-CM | POA: Diagnosis not present

## 2015-08-08 DIAGNOSIS — E119 Type 2 diabetes mellitus without complications: Secondary | ICD-10-CM | POA: Diagnosis present

## 2015-08-08 DIAGNOSIS — Z79899 Other long term (current) drug therapy: Secondary | ICD-10-CM

## 2015-08-08 DIAGNOSIS — R531 Weakness: Secondary | ICD-10-CM | POA: Diagnosis not present

## 2015-08-08 DIAGNOSIS — E785 Hyperlipidemia, unspecified: Secondary | ICD-10-CM | POA: Diagnosis not present

## 2015-08-08 DIAGNOSIS — Z8249 Family history of ischemic heart disease and other diseases of the circulatory system: Secondary | ICD-10-CM | POA: Diagnosis not present

## 2015-08-08 DIAGNOSIS — Z950 Presence of cardiac pacemaker: Secondary | ICD-10-CM

## 2015-08-08 DIAGNOSIS — C9000 Multiple myeloma not having achieved remission: Secondary | ICD-10-CM | POA: Diagnosis present

## 2015-08-08 DIAGNOSIS — I10 Essential (primary) hypertension: Secondary | ICD-10-CM | POA: Diagnosis present

## 2015-08-08 DIAGNOSIS — R918 Other nonspecific abnormal finding of lung field: Secondary | ICD-10-CM | POA: Diagnosis not present

## 2015-08-08 DIAGNOSIS — Z794 Long term (current) use of insulin: Secondary | ICD-10-CM

## 2015-08-08 DIAGNOSIS — D472 Monoclonal gammopathy: Secondary | ICD-10-CM | POA: Diagnosis not present

## 2015-08-08 LAB — COMPREHENSIVE METABOLIC PANEL
ALK PHOS: 48 U/L (ref 38–126)
ALT: 26 U/L (ref 17–63)
AST: 32 U/L (ref 15–41)
Albumin: 3.5 g/dL (ref 3.5–5.0)
Anion gap: 11 (ref 5–15)
BILIRUBIN TOTAL: 0.6 mg/dL (ref 0.3–1.2)
BUN: 15 mg/dL (ref 6–20)
CALCIUM: 9.1 mg/dL (ref 8.9–10.3)
CO2: 21 mmol/L — ABNORMAL LOW (ref 22–32)
CREATININE: 0.98 mg/dL (ref 0.61–1.24)
Chloride: 102 mmol/L (ref 101–111)
Glucose, Bld: 186 mg/dL — ABNORMAL HIGH (ref 65–99)
Potassium: 4 mmol/L (ref 3.5–5.1)
Sodium: 134 mmol/L — ABNORMAL LOW (ref 135–145)
TOTAL PROTEIN: 6.9 g/dL (ref 6.5–8.1)

## 2015-08-08 LAB — CBC WITH DIFFERENTIAL/PLATELET
BASOS ABS: 0.1 10*3/uL (ref 0.0–0.1)
Basophils Relative: 1 % (ref 0–1)
EOS PCT: 3 % (ref 0–5)
Eosinophils Absolute: 0.2 10*3/uL (ref 0.0–0.7)
HEMATOCRIT: 34.2 % — AB (ref 39.0–52.0)
Hemoglobin: 11.6 g/dL — ABNORMAL LOW (ref 13.0–17.0)
LYMPHS ABS: 2.8 10*3/uL (ref 0.7–4.0)
LYMPHS PCT: 35 % (ref 12–46)
MCH: 29.4 pg (ref 26.0–34.0)
MCHC: 33.9 g/dL (ref 30.0–36.0)
MCV: 86.8 fL (ref 78.0–100.0)
Monocytes Absolute: 0.7 10*3/uL (ref 0.1–1.0)
Monocytes Relative: 8 % (ref 3–12)
NEUTROS ABS: 4.1 10*3/uL (ref 1.7–7.7)
Neutrophils Relative %: 53 % (ref 43–77)
PLATELETS: 236 10*3/uL (ref 150–400)
RBC: 3.94 MIL/uL — AB (ref 4.22–5.81)
RDW: 13.8 % (ref 11.5–15.5)
WBC: 7.8 10*3/uL (ref 4.0–10.5)

## 2015-08-08 LAB — TSH: TSH: 1.039 u[IU]/mL (ref 0.350–4.500)

## 2015-08-08 LAB — PROTIME-INR
INR: 1.12 (ref 0.00–1.49)
Prothrombin Time: 14.6 seconds (ref 11.6–15.2)

## 2015-08-08 LAB — APTT: APTT: 28 s (ref 24–37)

## 2015-08-08 LAB — TROPONIN I: Troponin I: <0.03 ng/mL (ref <0.031)

## 2015-08-08 LAB — GLUCOSE, CAPILLARY: GLUCOSE-CAPILLARY: 158 mg/dL — AB (ref 65–99)

## 2015-08-08 LAB — MRSA PCR SCREENING: MRSA BY PCR: NEGATIVE

## 2015-08-08 MED ORDER — SODIUM CHLORIDE 0.9 % IV SOLN
INTRAVENOUS | Status: DC
  Administered 2015-08-09: 50 mL/h via INTRAVENOUS

## 2015-08-08 MED ORDER — CHLORHEXIDINE GLUCONATE 4 % EX LIQD
60.0000 mL | Freq: Once | CUTANEOUS | Status: AC
  Administered 2015-08-08: 4 via TOPICAL
  Filled 2015-08-08: qty 60

## 2015-08-08 MED ORDER — HEPARIN SODIUM (PORCINE) 5000 UNIT/ML IJ SOLN
5000.0000 [IU] | Freq: Three times a day (TID) | INTRAMUSCULAR | Status: DC
  Administered 2015-08-09 (×2): 5000 [IU] via SUBCUTANEOUS
  Filled 2015-08-08 (×4): qty 1

## 2015-08-08 MED ORDER — SODIUM CHLORIDE 0.9 % IR SOLN
80.0000 mg | Status: AC
  Administered 2015-08-09: 80 mg
  Filled 2015-08-08 (×2): qty 2

## 2015-08-08 MED ORDER — NITROGLYCERIN 0.4 MG SL SUBL: 0.4000 mg | SUBLINGUAL_TABLET | SUBLINGUAL | Status: DC | PRN

## 2015-08-08 MED ORDER — CHLORHEXIDINE GLUCONATE 4 % EX LIQD
60.0000 mL | Freq: Once | CUTANEOUS | Status: AC
  Administered 2015-08-09: 4 via TOPICAL
  Filled 2015-08-08: qty 15

## 2015-08-08 MED ORDER — ACETAMINOPHEN 325 MG PO TABS
650.0000 mg | ORAL_TABLET | ORAL | Status: DC | PRN
  Administered 2015-08-09 – 2015-08-10 (×2): 325 mg via ORAL
  Filled 2015-08-08 (×3): qty 2

## 2015-08-08 MED ORDER — ADULT MULTIVITAMIN W/MINERALS CH
1.0000 | ORAL_TABLET | Freq: Every morning | ORAL | Status: DC
  Administered 2015-08-09 – 2015-08-10 (×2): 1 via ORAL
  Filled 2015-08-08 (×2): qty 1

## 2015-08-08 MED ORDER — HYDROCODONE-ACETAMINOPHEN 5-325 MG PO TABS: 1.0000 | ORAL_TABLET | Freq: Four times a day (QID) | ORAL | Status: DC | PRN

## 2015-08-08 MED ORDER — ASPIRIN EC 81 MG PO TBEC
81.0000 mg | DELAYED_RELEASE_TABLET | Freq: Every day | ORAL | Status: DC
  Administered 2015-08-09 – 2015-08-10 (×2): 81 mg via ORAL
  Filled 2015-08-08 (×2): qty 1

## 2015-08-08 MED ORDER — CEFAZOLIN SODIUM-DEXTROSE 2-3 GM-% IV SOLR
2.0000 g | INTRAVENOUS | Status: DC
  Filled 2015-08-08 (×2): qty 50

## 2015-08-08 MED ORDER — ONDANSETRON HCL 4 MG/2ML IJ SOLN: 4.0000 mg | Freq: Four times a day (QID) | INTRAMUSCULAR | Status: DC | PRN

## 2015-08-08 NOTE — H&P (Signed)
Patient ID: Michael Mons Sr. MRN: [id number], DOB/AGE: January 17, 1946   Admit date: 08/08/2015   Primary Physician: Volanda Napoleon, MD Primary Cardiologist: Dr. Gwenlyn Found  Pt. Profile:   pleasant 69 year old African-American male with past medical history of HTN, HLD, DM, history of multiple myeloma, chronic right bundle branch block, and remote history of rheumatic fever transferred from [facility] for 2:1 heart block  Problem List  Past Medical History  Diagnosis Date  . Diabetes mellitus   . Hyperlipidemia   . Hypertension   . Osteoarthritis   . MGUS (monoclonal gammopathy of unknown significance)   . GERD (gastroesophageal reflux disease)   . Anemia   . Multiple myeloma 02/2013    normal cytogenetics and FISH panel on 03/11/2013.   . Cataract   . Syncope   . Right bundle branch block   . Bundle branch block, right     Past Surgical History  Procedure Laterality Date  . Eye surgery       Allergies  Allergies  Allergen Reactions  . Amlodipine Swelling  . Crestor [Rosuvastatin] Other (See Comments)    Per pt unable to focus    HPI  The patient is a pleasant 69 year old African-American male with past medical history of HTN, HLD, DM, history of multiple myeloma, chronic right bundle branch block, and remote history of rheumatic fever. He is a Architectural technologist. For the past year, he has been having occasional dizziness episode which occurs once every several month. He sees Dr. Marin Olp for follow-up on multiple myeloma. The last office follow-up was on 03/09/2015 at which time, it was noted there was not any evidence of progressive disease. Although his myeloma numbers slowly trending upward. Continued observation was recommended.  He denies any history of chem or radiation therapy and under the impression his myeloma was still at early stage.   His PCP recently stopped his metoprolol on a/13/2016 due to several episodes of dizziness. He was also complaining of increasing shortness  of breath with exertion, however denies ever having any chest pain. His blood glucose has been elevated.  He was seen at [place] on 08/08/2015. He had one episode of syncope in in the recent weeks. However he continued to have dizziness. He was previously seen by Dr. Gwenlyn Found in December 2015 due to presyncope, his symptom was originally thought to be due to orthostasis, his right bundle branch block was chronic. The EKG obtained which shows he was in 2:1 heart block with severe bradycardia. He was sent to [facility] for further evaluation.   Prior to arrival to [facility], Dr. Lorelei Pont has discussed the case with Dr. Johnsie Cancel. On arrival to ED, his heart rate was in the 30s to 40s. He was seen by cardiology service in the ED directly.   Home Medications  Prior to Admission medications   Medication Sig Start Date End Date Taking? Authorizing Provider  Glucosamine-Chondroitin-MSM (TRIPLE FLEX PO) Take 1 tablet by mouth 2 (two) times daily.     Historical Provider, MD  HYDROcodone-acetaminophen (NORCO/VICODIN) 5-325 MG per tablet Take 1 tablet by mouth every 6 (six) hours as needed for moderate pain. Patient not taking: Reported on 08/08/2015 06/28/15   Wendie Agreste, MD  Insulin Pen Needle 31G X 5 MM MISC 1 Units by Does not apply route daily. Patient not taking: Reported on 08/08/2015 07/31/15   Roselee Culver, MD  Lancets MISC Use as instructed twice a day 01/31/14   Collene Leyden, PA-C  Liraglutide 18 MG/3ML SOPN Inject 0.6 mg daily for one week then increase to 1.2 mg daily for one week finally increased to 1.8 following that  second week Patient not taking: Reported on 08/08/2015 07/31/15   Roselee Culver, MD  losartan (COZAAR) 50 MG tablet Start with a 1/2 tablet daily and go to a whole tablet as needed Patient not taking: Reported on 08/08/2015 06/07/15   [name] [name], MD  metFORMIN (GLUCOPHAGE) 1000 MG tablet TAKE 1 TABLET BY MOUTH TWICE A DAY WITH MEALS. 06/07/15    Darreld Mclean, MD  metoprolol tartrate (LOPRESSOR) 25 MG tablet Take 1 tablet (25 mg total) by mouth 2 (two) times daily. Patient not taking: Reported on 08/08/2015 06/07/15   Darreld Mclean, MD  Multiple Vitamin (MULTIVITAMIN WITH MINERALS) TABS Take 1 tablet by mouth every morning.    Historical Provider, MD  ONE TOUCH ULTRA TEST test strip USE AS INSTRUCTED TWICE A DAY 08/04/15   Darreld Mclean, MD    Family History  Family History  Problem Relation Age of Onset  . Hypertension Mother   . Cancer Mother   . Stroke Mother   . Hypertension Sister   . Hypertension Brother   . Hypertension Maternal Grandmother     Social History  Social History   Social History  . Marital Status: Married    Spouse Name: N/A  . Number of Children: N/A  . Years of Education: N/A   Occupational History  . Not on file.   Social History Main Topics  . Smoking status: Former Smoker -- 2.00 packs/day for 35 years    Types: Cigarettes    Start date: 03/26/1963    Quit date: 12/18/1997  . Smokeless tobacco: Never Used     Comment: quit 15 years  . Alcohol Use: No  . Drug Use: No  . Sexual Activity: Yes    Birth Control/ Protection: None   Other Topics Concern  . Not on file   Social History Narrative   Married   Building Maintenance        Review of Systems General:  No chills, fever, night sweats or weight changes.  Cardiovascular:  No chest pain, edema, orthopnea, palpitations, paroxysmal nocturnal dyspnea. +dyspnea on exertion, dizziness Dermatological: No rash, lesions/masses Respiratory: No cough, dyspnea Urologic: No hematuria, dysuria Abdominal:   No nausea, vomiting, diarrhea, bright red blood per rectum, melena, or hematemesis Neurologic:  No visual changes, wkns. All other systems reviewed and are otherwise negative except as noted above.  Physical Exam  There were no vitals taken for this visit.  General: Pleasant, NAD Psych: Normal affect. Neuro: Alert and  oriented X 3. Moves all extremities spontaneously. HEENT: Normal  Neck: Supple without bruits or JVD. Lungs:  Resp regular and unlabored, CTA. Heart: RRR no s3, s4, or murmurs. Abdomen: Soft, non-tender, non-distended, BS + x 4.  Extremities: No clubbing, cyanosis or edema. DP/PT/Radials 2+ and equal bilaterally.  Labs  Troponin (Point of Care Test) No results for input(s): TROPIPOC in the last 72 hours. No results for input(s): CKTOTAL, CKMB, TROPONINI in the last 72 hours. Lab Results  Component Value Date   WBC 6.6 07/31/2015   HGB 12.5* 07/31/2015   HCT 38.1* 07/31/2015   MCV 86.6 07/31/2015   PLT 236 07/31/2015   No results for input(s): NA, K, CL, CO2, BUN, CREATININE, CALCIUM, PROT, BILITOT, ALKPHOS, ALT, AST, GLUCOSE in the last 168 hours.  Invalid input(s): LABALBU Lab Results  Component  Value Date   CHOL 199 07/31/2015   HDL 32* 07/31/2015   LDLCALC 135* 07/31/2015   TRIG 161* 07/31/2015   No results found for: DDIMER   Radiology/Studies  Dg Chest 2 View  08/08/2015   CLINICAL DATA:  Cough.  Bradycardia.  Presyncope.  EXAM: CHEST  2 VIEW  COMPARISON:  04/16/2015  FINDINGS: The heart size and mediastinal contours are within normal limits. Both lungs are clear. Mild thoracic spine degenerative changes again noted.  IMPRESSION: No active cardiopulmonary disease.   Electronically Signed   By: Earle Gell M.D.   On: 08/08/2015 15:40    ECG  2:1 block with sinus brady and RBBB  Echocardiogram  pending    ASSESSMENT AND PLAN  1. 2:1 heart block with severe bradycardia  - BB stopped more than a week ago, has been having episodic dizziness  - Zoll pads on, atropine at bedside, admit overnight, EP to see in AM, likely need pacemaker  - denies any CP, will need myoview later, low suspicion for ACS  - obtain thyroid lab. Patient is L handed and if need PPM, will need to be placed on the R side.  - obtain echo   2. HTN 3. HLD 4. DM 5. multiple myeloma: followed  by Dr. Marin Olp 6. chronic right bundle branch block 7. remote history of rheumatic fever   Signed, Almyra Deforest, PA-C 08/08/2015, 5:03 PM  See separate progress note.  Symptomatic 2:1 block with RBBB No history of CAD.  Normal myovue 2013.  Somewhat poorly contorlled DM Echo in am Check labs Likely PPM tomorrow afternoon with Dr Lovena Le.  Jenkins Rouge

## 2015-08-08 NOTE — ED Notes (Signed)
Per EMS: pt from home for eval of bradycardia, sent from [facility] for eval of poss complete heart block after EKG was done at [facility] due to pt having a dizzy spell while walking. Pt denies any symptoms at this time, states has been seeing PCP for intermittent sob and some dizziness with exertion. Denies any cp, alert adn oriented at this time.

## 2015-08-08 NOTE — ED Notes (Addendum)
Pt here via [facility] from [place] for sob and dizziness on exertion.  EKG showed HB.

## 2015-08-08 NOTE — Progress Notes (Signed)
[facility] [address] [place] [address] [phone]  Date:  08/08/2015   Name:  Michael Deupree Sr.   DOB:  06/02/1946   MRN:  [id number]  PCP:  Volanda Napoleon, MD    Chief Complaint: Follow-up and low pulse   History of Present Illness:  Michael Thetford Sr. is a 69 y.o. very pleasant male patient who presents with the following:  He was here in June for a CPE- he was noted to have moderately well controlled DM at that time  Came in to see [place] about one week ago with concern about rising blood sugars and dizziness- one epiosde of faiting.  We had planned to stop the glipizide and do victoza, but this was too expensive.  He is also still on metformin He is currently off the metoprolol- stopped a week ago, and also stopped taking the lopressor because he was worried about taking this with his dizziness  He has only fainted once, but has felt that he might pass out other times.  He saw Dr. Gwenlyn Found 12/15 due to pre-syncope; at that time his sx were thought to be due to orthostasis, he was noted to have a right BBB, no further w/u needed  Today he tried running some stairs and checked his pulse- however it did not seem that it got any higher to him even with exercise  He has not noted any CP, but does have some SOB with exertion.  However he states this is not new He did have a low- risk myoview in 2013 and an echo in 2010 Pulse Readings from Last 3 Encounters:  08/08/15 44  07/31/15 74  06/28/15 74   Orthostatic VS for the past 24 hrs:  BP- Lying Pulse- Lying BP- Sitting Pulse- Sitting BP- Standing at 0 minutes Pulse- Standing at 0 minutes  08/08/15 1514 (!) 162/91 mmHg (!) 39 (!) 188/101 mmHg 55 179/66 mmHg (!) 40    Lab Results  Component Value Date   HGBA1C 9.6 07/31/2015     Patient Active Problem List   Diagnosis Date Noted  . Right bundle branch block 12/16/2014  . Syncope 12/16/2014  . Proteinuria due to type 2 diabetes mellitus 11/12/2014  .  Encounter for therapeutic drug monitoring 03/13/2013  . Multiple myeloma 02/15/2013  . Edema 01/07/2013  . Finger laceration 08/21/2012  . Diabetes mellitus due to underlying condition with other diabetic kidney complication   . Hyperlipidemia   . Hypertension   . MGUS (monoclonal gammopathy of unknown significance)   . GERD (gastroesophageal reflux disease)     Past Medical History  Diagnosis Date  . Diabetes mellitus   . Hyperlipidemia   . Hypertension   . Osteoarthritis   . MGUS (monoclonal gammopathy of unknown significance)   . GERD (gastroesophageal reflux disease)   . Anemia   . Multiple myeloma 02/2013    normal cytogenetics and FISH panel on 03/11/2013.   . Cataract   . Syncope   . Right bundle branch block   . Bundle branch block, right     Past Surgical History  Procedure Laterality Date  . Eye surgery      Social History  Substance Use Topics  . Smoking status: Former Smoker -- 2.00 packs/day for 35 years    Types: Cigarettes    Start date: 03/26/1963    Quit date: 12/18/1997  . Smokeless tobacco: Never Used     Comment: quit 15 years  . Alcohol Use:  No    Family History  Problem Relation Age of Onset  . Hypertension Mother   . Cancer Mother   . Stroke Mother   . Hypertension Sister   . Hypertension Brother   . Hypertension Maternal Grandmother     Allergies  Allergen Reactions  . Amlodipine Swelling  . Crestor [Rosuvastatin] Other (See Comments)    Per pt unable to focus    Medication list has been reviewed and updated.  Current Outpatient Prescriptions on File Prior to Visit  Medication Sig Dispense Refill  . Glucosamine-Chondroitin-MSM (TRIPLE FLEX PO) Take 1 tablet by mouth 2 (two) times daily.     . Lancets MISC Use as instructed twice a day 100 each 2  . metFORMIN (GLUCOPHAGE) 1000 MG tablet TAKE 1 TABLET BY MOUTH TWICE A DAY WITH MEALS. 180 tablet 3  . Multiple Vitamin (MULTIVITAMIN WITH MINERALS) TABS Take 1 tablet by mouth every  morning.    . ONE TOUCH ULTRA TEST test strip USE AS INSTRUCTED TWICE A DAY 100 each 11  . HYDROcodone-acetaminophen (NORCO/VICODIN) 5-325 MG per tablet Take 1 tablet by mouth every 6 (six) hours as needed for moderate pain. (Patient not taking: Reported on 08/08/2015) 20 tablet 0  . Insulin Pen Needle 31G X 5 MM MISC 1 Units by Does not apply route daily. (Patient not taking: Reported on 08/08/2015) 100 each 5  . Liraglutide 18 MG/3ML SOPN Inject 0.6 mg daily for one week then increase to 1.2 mg daily for one week finally increased to 1.8 following that  second week (Patient not taking: Reported on 08/08/2015) 3 pen 5  . losartan (COZAAR) 50 MG tablet Start with a 1/2 tablet daily and go to a whole tablet as needed (Patient not taking: Reported on 08/08/2015) 90 tablet 3  . metoprolol tartrate (LOPRESSOR) 25 MG tablet Take 1 tablet (25 mg total) by mouth 2 (two) times daily. (Patient not taking: Reported on 08/08/2015) 180 tablet 3   No current facility-administered medications on file prior to visit.    Review of Systems:  As per HPI- otherwise negative.   Physical Examination: Filed Vitals:   08/08/15 1426  BP: 124/72  Pulse: 44  Temp: 97.6 F (36.4 C)  Resp: 18   Filed Vitals:   08/08/15 1426  Weight: 217 lb (98.431 kg)   Body mass index is 27.85 kg/(m^2). Ideal Body Weight:    GEN: WDWN, NAD, Non-toxic, A & O x 3, looks well HEENT: Atraumatic, Normocephalic. Neck supple. No masses, No LAD. Ears and Nose: No external deformity. CV: very bradycardic and HR irregular, No M/G/R. No JVD. No thrill. No extra heart sounds. PULM: CTA B, no wheezes, crackles, rhonchi. No retractions. No resp. distress. No accessory muscle use. ABD: S, NT, ND, +BS. No rebound. No HSM. EXTR: No c/c/e NEURO Normal gait.  PSYCH: Normally interactive. Conversant. Not depressed or anxious appearing.  Calm demeanor.   EKG:   Severe sinus bradycardia, RBBB, ow negative  Discussed with cardiologist on call-  he is in 2:1 AV block and needs hospital admission.  Prefer EMS transport.  Will send to [facility] ER  [facility] reading (PRIMARY) by  Dr. Lorelei Pont. CXR: negative   CHEST 2 VIEW  COMPARISON: 04/16/2015  FINDINGS: The heart size and mediastinal contours are within normal limits. Both lungs are clear. Mild thoracic spine degenerative changes again noted.  IMPRESSION: No active cardiopulmonary disease.   EMS arrived for transport at 4:12 pm Assessment and Plan: Bradycardia - Plan: DG Chest  2 View, EKG 12-Lead  Pre-syncope - Plan: DG Chest 2 View, EKG 12-Lead  Severe bradycardia- he appears to be in 2:1 block  Transfer to ER for further care  Signed Lamar Blinks, MD

## 2015-08-08 NOTE — Progress Notes (Signed)
Patient ID: Michael Molstad Sr., male   DOB: May 29, 1946, 69 y.o.   MRN: [id number] See full HP by PA Patient with increasing dizzyness and syncope No known CAD Poorly controlled DM Lopressor stopped a week ago ECG at Dr [facility] office showed 2:1 block with chronic RBBB In ER HR 40's asymptomatic with good BP No chest pain History of rheumatic fever and negative w/u CAD in past normal myovue 2013  Plan: Check labs Echo Likely pacer tomorrow afternoon with Dr Lovena Le Placed on schedule orders written Zoll / atropine at bedside  Discussed procedure with patient Willing to proceed He is left handed so would place PPM on right side.  Jenkins Rouge

## 2015-08-08 NOTE — ED Notes (Signed)
Cardiology at bedside.  Pacer pads placed.

## 2015-08-08 NOTE — ED Provider Notes (Signed)
CSN: [id number]     Arrival date & time 08/08/15  1640 History   First MD Initiated Contact with Patient 08/08/15 1648     Chief Complaint  Patient presents with  . Shortness of Breath     (Consider location/radiation/quality/duration/timing/severity/associated sxs/prior Treatment) HPI  The patient is a 69 year old male, he is coming from the urgent care with a complaint of weakness and shortness of breath on exertion. He was seen a couple of weeks ago when his metoprolol was stopped for similar symptoms. Today he states the symptoms are no worse but wanted to be rechecked. He denies chest pain shortness of breath fevers chills nausea vomiting diarrhea swelling rashes headache sore throat or any other complaints. The symptoms are persistent, worse with exertion, stopping metoprolol did not help his symptoms.  Past Medical History  Diagnosis Date  . Diabetes mellitus   . Hyperlipidemia   . Hypertension   . Osteoarthritis   . MGUS (monoclonal gammopathy of unknown significance)   . GERD (gastroesophageal reflux disease)   . Anemia   . Multiple myeloma 02/2013    normal cytogenetics and FISH panel on 03/11/2013.   . Cataract   . Syncope   . Right bundle branch block   . Bundle branch block, right    Past Surgical History  Procedure Laterality Date  . Eye surgery     Family History  Problem Relation Age of Onset  . Hypertension Mother   . Cancer Mother   . Stroke Mother   . Hypertension Sister   . Hypertension Brother   . Hypertension Maternal Grandmother    Social History  Substance Use Topics  . Smoking status: Former Smoker -- 2.00 packs/day for 35 years    Types: Cigarettes    Start date: 03/26/1963    Quit date: 12/18/1997  . Smokeless tobacco: Never Used     Comment: quit 15 years  . Alcohol Use: No    Review of Systems  All other systems reviewed and are negative.     Allergies  Amlodipine and Crestor  Home Medications   Prior to Admission medications    Medication Sig Start Date End Date Taking? Authorizing Provider  diphenhydramine-acetaminophen (TYLENOL PM) 25-500 MG TABS Take 1 tablet by mouth at bedtime as needed (sleep).   Yes Historical Provider, MD  ferrous sulfate 325 (65 FE) MG tablet Take 325 mg by mouth every other day.   Yes Historical Provider, MD  glipiZIDE (GLUCOTROL XL) 5 MG 24 hr tablet Take 5 mg by mouth 2 (two) times daily. 05/17/15  Yes Historical Provider, MD  Glucosamine-Chondroitin-MSM (TRIPLE FLEX PO) Take 1 tablet by mouth daily.    Yes Historical Provider, MD  metFORMIN (GLUCOPHAGE) 1000 MG tablet TAKE 1 TABLET BY MOUTH TWICE A DAY WITH MEALS. Patient taking differently: Take 1,000 mg by mouth 2 (two) times daily with a meal.  06/07/15  Yes [name] [name], MD  Multiple Vitamin (MULTIVITAMIN WITH MINERALS) TABS Take 1 tablet by mouth daily.    Yes Historical Provider, MD  Omega-3 Fatty Acids (FISH OIL PO) Take 1 capsule by mouth every other day.   Yes Historical Provider, MD  HYDROcodone-acetaminophen (NORCO/VICODIN) 5-325 MG per tablet Take 1 tablet by mouth every 6 (six) hours as needed for moderate pain. Patient not taking: Reported on 08/08/2015 06/28/15   Wendie Agreste, MD  Insulin Pen Needle 31G X 5 MM MISC 1 Units by Does not apply route daily. 07/31/15   Roselee Culver, MD  Lancets MISC Use as instructed twice a day 01/31/14   Nelva Nay, PA-C  Liraglutide 18 MG/3ML SOPN Inject 0.6 mg daily for one week then increase to 1.2 mg daily for one week finally increased to 1.8 following that  second week Patient not taking: Reported on 08/08/2015 07/31/15   Carmelina Dane, MD  losartan (COZAAR) 50 MG tablet Start with a 1/2 tablet daily and go to a whole tablet as needed Patient taking differently: Take 25 mg by mouth daily.  06/07/15   Pearline Cables, MD  metoprolol tartrate (LOPRESSOR) 25 MG tablet Take 1 tablet (25 mg total) by mouth 2 (two) times daily. Patient not taking: Reported on 08/08/2015 06/07/15    Pearline Cables, MD  ONE TOUCH ULTRA TEST test strip USE AS INSTRUCTED TWICE A DAY 08/04/15   Gwenlyn Found [name], MD   BP 167/96 mmHg  Pulse 71  Temp(Src) 97.8 F (36.6 C) (Oral)  Resp 17  Ht 6\' 3"  (1.905 m)  Wt 210 lb 12.2 oz (95.6 kg)  BMI 26.34 kg/m2  SpO2 99% Physical Exam  Constitutional: He appears well-developed and well-nourished. No distress.  HENT:  Head: Normocephalic and atraumatic.  Mouth/Throat: Oropharynx is clear and moist. No oropharyngeal exudate.  Eyes: Conjunctivae and EOM are normal. Pupils are equal, round, and reactive to light. Right eye exhibits no discharge. Left eye exhibits no discharge. No scleral icterus.  Neck: Normal range of motion. Neck supple. No JVD present. No thyromegaly present.  Cardiovascular: Normal heart sounds and intact distal pulses.  Exam reveals no gallop and no friction rub.   No murmur heard. Bradycardia, strong pulses, blood pressure is slightly hypertensive  Pulmonary/Chest: Effort normal and breath sounds normal. No respiratory distress. He has no wheezes. He has no rales.  Abdominal: Soft. Bowel sounds are normal. He exhibits no distension and no mass. There is no tenderness.  Musculoskeletal: Normal range of motion. He exhibits no edema or tenderness.  Lymphadenopathy:    He has no cervical adenopathy.  Neurological: He is alert. Coordination normal.  Skin: Skin is warm and dry. No rash noted. No erythema.  Psychiatric: He has a normal mood and affect. His behavior is normal.  Nursing note and vitals reviewed.   ED Course  Procedures (including critical care time) Labs Review Labs Reviewed  CBC WITH DIFFERENTIAL/PLATELET - Abnormal; Notable for the following:    RBC 3.94 (*)    Hemoglobin 11.6 (*)    HCT 34.2 (*)    All other components within normal limits  COMPREHENSIVE METABOLIC PANEL - Abnormal; Notable for the following:    Sodium 134 (*)    CO2 21 (*)    Glucose, Bld 186 (*)    All other components within  normal limits  GLUCOSE, CAPILLARY - Abnormal; Notable for the following:    Glucose-Capillary 158 (*)    All other components within normal limits  MRSA PCR SCREENING  TSH  APTT  PROTIME-INR  TROPONIN I  TROPONIN I  TROPONIN I  BASIC METABOLIC PANEL    Imaging Review Dg Chest 2 View  08/08/2015   CLINICAL DATA:  Cough.  Bradycardia.  Presyncope.  EXAM: CHEST  2 VIEW  COMPARISON:  04/16/2015  FINDINGS: The heart size and mediastinal contours are within normal limits. Both lungs are clear. Mild thoracic spine degenerative changes again noted.  IMPRESSION: No active cardiopulmonary disease.   Electronically Signed   By: 04/18/2015 M.D.   On: 08/08/2015 15:40  Dg Chest Port 1 View  08/08/2015   CLINICAL DATA:  He has only fainted once, but has felt that he might pass out other times. He saw Dr. Gwenlyn Found 12/15 due to pre-syncope; at that time his sx were thought to be due to orthostasis, he was noted to have a right BBB, no further w/u needed Today he tried running some stairs and checked his pulse- however it did not seem that it got any higher to him even with exercise. Patient states no chest pain. SOB  EXAM: PORTABLE CHEST - 1 VIEW  COMPARISON:  08/08/2015 at 1345 hours  FINDINGS: Cardiac silhouette normal in size and configuration. Normal mediastinal and hilar contours.  No lung consolidation or evidence of pulmonary edema. No pleural effusion or pneumothorax.  Bony thorax is intact.  No significant change from the earlier study.  IMPRESSION: No active disease.   Electronically Signed   By: Lajean Manes M.D.   On: 08/08/2015 17:34   I have personally reviewed and evaluated these images and lab results as part of my medical decision-making.   EKG Interpretation   Date/Time:  Sunday August 08 2015 16:51:05 EDT Ventricular Rate:  40 PR Interval:  236 QRS Duration: 168 QT Interval:  611 QTC Calculation: 498 R Axis:   61 Text Interpretation:  3rd degree AV  block likely bradycardia.  Nonspecific  T wave abnormality Abnormal ekg Since last tracing rate slower and new  block Confirmed by Shristi Scheib  MD, Osvaldo Lamping ([id number]) on 08/08/2015 5:11:45 PM      MDM   Final diagnoses:  AV block, 3rd degree    The patient's mental status is normal, his vital signs are remarkable only for severe bradycardia. His EKG has a wide QRS complex in what appears to be a high grade AV block. This could be a second-degree type II with high block or could be grade 3. Cardiology has seen the patient. He has patient pads on his chest, we are not needing to use them at this time because of normal blood pressure and mental status. He will need a pacemaker. Cardiology is on board and will admit the patient. The patient is critically ill with a high grade AV block.  CRITICAL CARE Performed by: Johnna Acosta Total critical care time: 35 Critical care time was exclusive of separately billable procedures and treating other patients. Critical care was necessary to treat or prevent imminent or life-threatening deterioration. Critical care was time spent personally by me on the following activities: development of treatment plan with patient and/or surrogate as well as nursing, discussions with consultants, evaluation of patient's response to treatment, examination of patient, obtaining history from patient or surrogate, ordering and performing treatments and interventions, ordering and review of laboratory studies, ordering and review of radiographic studies, pulse oximetry and re-evaluation of patient's condition.     Noemi Chapel, MD 08/08/15 [phone]

## 2015-08-09 ENCOUNTER — Inpatient Hospital Stay (HOSPITAL_COMMUNITY): Payer: Medicare Other

## 2015-08-09 ENCOUNTER — Other Ambulatory Visit: Payer: Medicare Other

## 2015-08-09 ENCOUNTER — Ambulatory Visit: Payer: Medicare Other | Admitting: Hematology & Oncology

## 2015-08-09 ENCOUNTER — Telehealth: Payer: Self-pay | Admitting: Hematology & Oncology

## 2015-08-09 ENCOUNTER — Encounter (HOSPITAL_COMMUNITY)
Admission: EM | Disposition: A | Discharge status: Home / Self Care | Payer: Self-pay | Source: Home / Self Care | Attending: Cardiovascular Disease

## 2015-08-09 DIAGNOSIS — R001 Bradycardia, unspecified: Secondary | ICD-10-CM

## 2015-08-09 DIAGNOSIS — I441 Atrioventricular block, second degree: Principal | ICD-10-CM

## 2015-08-09 HISTORY — PX: EP IMPLANTABLE DEVICE: SHX172B

## 2015-08-09 LAB — BASIC METABOLIC PANEL
Anion gap: 10 (ref 5–15)
BUN: 13 mg/dL (ref 6–20)
CHLORIDE: 103 mmol/L (ref 101–111)
CO2: 23 mmol/L (ref 22–32)
Calcium: 9.1 mg/dL (ref 8.9–10.3)
Creatinine, Ser: 0.87 mg/dL (ref 0.61–1.24)
Glucose, Bld: 197 mg/dL — ABNORMAL HIGH (ref 65–99)
POTASSIUM: 3.9 mmol/L (ref 3.5–5.1)
SODIUM: 136 mmol/L (ref 135–145)

## 2015-08-09 LAB — GLUCOSE, CAPILLARY
GLUCOSE-CAPILLARY: 196 mg/dL — AB (ref 65–99)
Glucose-Capillary: 193 mg/dL — ABNORMAL HIGH (ref 65–99)
Glucose-Capillary: 252 mg/dL — ABNORMAL HIGH (ref 65–99)
Glucose-Capillary: 305 mg/dL — ABNORMAL HIGH (ref 65–99)

## 2015-08-09 LAB — TROPONIN I: Troponin I: <0.03 ng/mL (ref <0.031)

## 2015-08-09 SURGERY — PACEMAKER IMPLANT
Anesthesia: LOCAL

## 2015-08-09 MED ORDER — CEFAZOLIN SODIUM-DEXTROSE 2-3 GM-% IV SOLR
INTRAVENOUS | Status: AC
  Filled 2015-08-09: qty 50

## 2015-08-09 MED ORDER — CEFAZOLIN SODIUM-DEXTROSE 2-3 GM-% IV SOLR
INTRAVENOUS | Status: DC | PRN
  Administered 2015-08-09: 2 g via INTRAVENOUS

## 2015-08-09 MED ORDER — SODIUM CHLORIDE 0.9 % IR SOLN
Status: AC
  Filled 2015-08-09: qty 2

## 2015-08-09 MED ORDER — HEPARIN (PORCINE) IN NACL 2-0.9 UNIT/ML-% IJ SOLN
INTRAMUSCULAR | Status: AC
  Filled 2015-08-09: qty 500

## 2015-08-09 MED ORDER — ACETAMINOPHEN 325 MG PO TABS: 325.0000 mg | ORAL_TABLET | ORAL | Status: DC | PRN

## 2015-08-09 MED ORDER — FENTANYL CITRATE (PF) 100 MCG/2ML IJ SOLN
INTRAMUSCULAR | Status: DC | PRN
  Administered 2015-08-09: 25 ug via INTRAVENOUS
  Administered 2015-08-09: 12.5 ug via INTRAVENOUS

## 2015-08-09 MED ORDER — ZOLPIDEM TARTRATE 5 MG PO TABS: 5.0000 mg | ORAL_TABLET | Freq: Every evening | ORAL | Status: DC | PRN

## 2015-08-09 MED ORDER — HYDRALAZINE HCL 20 MG/ML IJ SOLN
INTRAMUSCULAR | Status: AC
  Filled 2015-08-09: qty 1

## 2015-08-09 MED ORDER — HYDRALAZINE HCL 20 MG/ML IJ SOLN
10.0000 mg | Freq: Once | INTRAMUSCULAR | Status: AC
  Administered 2015-08-09: 10 mg via INTRAVENOUS

## 2015-08-09 MED ORDER — CARVEDILOL 6.25 MG PO TABS
6.2500 mg | ORAL_TABLET | Freq: Two times a day (BID) | ORAL | Status: DC
  Administered 2015-08-09 – 2015-08-10 (×2): 6.25 mg via ORAL
  Filled 2015-08-09 (×4): qty 1

## 2015-08-09 MED ORDER — HYDRALAZINE HCL 20 MG/ML IJ SOLN
10.0000 mg | INTRAMUSCULAR | Status: DC | PRN
  Administered 2015-08-09 – 2015-08-10 (×2): 10 mg via INTRAVENOUS
  Filled 2015-08-09 (×2): qty 1

## 2015-08-09 MED ORDER — LIDOCAINE HCL (PF) 1 % IJ SOLN
INTRAMUSCULAR | Status: AC
  Filled 2015-08-09: qty 60

## 2015-08-09 MED ORDER — DIPHENHYDRAMINE-APAP (SLEEP) 25-500 MG PO TABS: 1.0000 | ORAL_TABLET | Freq: Every evening | ORAL | Status: DC | PRN

## 2015-08-09 MED ORDER — FENTANYL CITRATE (PF) 100 MCG/2ML IJ SOLN
INTRAMUSCULAR | Status: AC
  Filled 2015-08-09: qty 4

## 2015-08-09 MED ORDER — HEPARIN (PORCINE) IN NACL 2-0.9 UNIT/ML-% IJ SOLN
INTRAMUSCULAR | Status: DC | PRN
  Administered 2015-08-09: 500 mL

## 2015-08-09 MED ORDER — INSULIN ASPART 100 UNIT/ML ~~LOC~~ SOLN
0.0000 [IU] | Freq: Every day | SUBCUTANEOUS | Status: DC
  Administered 2015-08-09: 4 [IU] via SUBCUTANEOUS

## 2015-08-09 MED ORDER — LIDOCAINE HCL (PF) 1 % IJ SOLN
INTRAMUSCULAR | Status: DC | PRN
Start: 2015-08-09 — End: 2015-08-09
  Administered 2015-08-09: 50 mL

## 2015-08-09 MED ORDER — CEFAZOLIN SODIUM 1-5 GM-% IV SOLN
1.0000 g | Freq: Four times a day (QID) | INTRAVENOUS | Status: AC
  Administered 2015-08-09 – 2015-08-10 (×3): 1 g via INTRAVENOUS
  Filled 2015-08-09 (×4): qty 50

## 2015-08-09 MED ORDER — FERROUS SULFATE 325 (65 FE) MG PO TABS
325.0000 mg | ORAL_TABLET | ORAL | Status: DC
  Administered 2015-08-10: 325 mg via ORAL
  Filled 2015-08-09: qty 1

## 2015-08-09 MED ORDER — INSULIN ASPART 100 UNIT/ML ~~LOC~~ SOLN
0.0000 [IU] | Freq: Three times a day (TID) | SUBCUTANEOUS | Status: DC
  Administered 2015-08-10: 5 [IU] via SUBCUTANEOUS

## 2015-08-09 MED ORDER — MIDAZOLAM HCL 5 MG/5ML IJ SOLN
INTRAMUSCULAR | Status: DC | PRN
  Administered 2015-08-09 (×3): 1 mg via INTRAVENOUS

## 2015-08-09 MED ORDER — GLIPIZIDE ER 5 MG PO TB24
5.0000 mg | ORAL_TABLET | Freq: Two times a day (BID) | ORAL | Status: DC
  Administered 2015-08-09 – 2015-08-10 (×2): 5 mg via ORAL
  Filled 2015-08-09 (×3): qty 1

## 2015-08-09 MED ORDER — ONDANSETRON HCL 4 MG/2ML IJ SOLN: 4.0000 mg | Freq: Four times a day (QID) | INTRAMUSCULAR | Status: DC | PRN

## 2015-08-09 MED ORDER — MIDAZOLAM HCL 5 MG/5ML IJ SOLN
INTRAMUSCULAR | Status: AC
  Filled 2015-08-09: qty 25

## 2015-08-09 SURGICAL SUPPLY — 7 items
CABLE SURGICAL S-101-97-12 (CABLE) ×2 IMPLANT
LEAD TENDRIL SDX 1688TC-52CM (Lead) ×2 IMPLANT
LEAD TENDRIL SDX 1688TC-58CM (Lead) ×2 IMPLANT
PAD DEFIB LIFELINK (PAD) ×2 IMPLANT
PPM ASSURITY DR PM2240 (Pacemaker) ×2 IMPLANT
SHEATH CLASSIC 7F (SHEATH) ×4 IMPLANT
TRAY PACEMAKER INSERTION (CUSTOM PROCEDURE TRAY) ×2 IMPLANT

## 2015-08-09 NOTE — Progress Notes (Signed)
Inpatient Diabetes Program Recommendations  AACE/ADA: New Consensus Statement on Inpatient Glycemic Control (2013)  Target Ranges:  Prepandial:   less than 140 mg/dL      Peak postprandial:   less than 180 mg/dL (1-2 hours)      Critically ill patients:  140 - 180 mg/dL   Results for Michael Wiggins, Michael SR. (MRN [id number]) as of 08/09/2015 09:03  Ref. Range 08/08/2015 19:45 08/09/2015 07:27  Glucose-Capillary Latest Ref Range: 65-99 mg/dL 158 (H) 196 (H)    Diabetes history: DM2 Outpatient Diabetes medications: Glipizide 5 mg BID, Metformin 1000 mg BID, Victoza 1.8 mg daily Current orders for Inpatient glycemic control: None  Inpatient Diabetes Program Recommendations Correction (SSI): Please order CBGs with Novolog correction ACHS.  Thanks, Barnie Alderman, RN, MSN, CCRN, CDE Diabetes Coordinator Inpatient Diabetes Program [phone] (Team Pager from [place] to [place]) [phone] ([facility] office) [phone] [place] office) [phone] [facility] office)

## 2015-08-09 NOTE — H&P (Signed)
Patient Name: Michael Genther Sr. Date of Encounter: 08/09/2015     Active Problems:   Heart block   Mobitz type 2 second degree heart block    SUBJECTIVE Denies chest pain or sob.  CURRENT MEDS . aspirin EC  81 mg Oral Daily  .  ceFAZolin (ANCEF) IV  2 g Intravenous To SS-Surg  . gentamicin irrigation  80 mg Irrigation To SS-Surg  . multivitamin with minerals  1 tablet Oral q morning - 10a    OBJECTIVE  Filed Vitals:   08/09/15 0800 08/09/15 0900 08/09/15 1000 08/09/15 1100  BP:      Pulse: 78 68 88   Temp:      TempSrc:      Resp: 19 15 17 11   Height:      Weight:      SpO2: 95% 97% 96%     Intake/Output Summary (Last 24 hours) at 08/09/15 1215 Last data filed at 08/09/15 1041  Gross per 24 hour  Intake  502.5 ml  Output   1350 ml  Net -847.5 ml   Filed Weights   08/08/15 1900 08/09/15 0400  Weight: 210 lb 12.2 oz (95.6 kg) 208 lb 8 oz (94.575 kg)    PHYSICAL EXAM  General: Pleasant, NAD. Neuro: Alert and oriented X 3. Moves all extremities spontaneously. Psych: Normal affect. HEENT:  Normal  Neck: Supple without bruits or JVD. Lungs:  Resp regular and unlabored, CTA. Heart: Reg brady no s3, s4, or murmurs. Abdomen: Soft, non-tender, non-distended, BS + x 4.  Extremities: No clubbing, cyanosis or edema. DP/PT/Radials 2+ and equal bilaterally.  Accessory Clinical Findings  CBC  Recent Labs  08/08/15 1707  WBC 7.8  NEUTROABS 4.1  HGB 11.6*  HCT 34.2*  MCV 86.8  PLT 941   Basic Metabolic Panel  Recent Labs  08/08/15 1707 08/09/15 0713  NA 134* 136  K 4.0 3.9  CL 102 103  CO2 21* 23  GLUCOSE 186* 197*  BUN 15 13  CREATININE 0.98 0.87  CALCIUM 9.1 9.1   Liver Function Tests  Recent Labs  08/08/15 1707  AST 32  ALT 26  ALKPHOS 48  BILITOT 0.6  PROT 6.9  ALBUMIN 3.5   No results for input(s): LIPASE, AMYLASE in the last 72 hours. Cardiac Enzymes  Recent Labs  08/08/15 2034 08/09/15 0126 08/09/15 0713  TROPONINI  <0.03 <0.03 <0.03   BNP Invalid input(s): POCBNP D-Dimer No results for input(s): DDIMER in the last 72 hours. Hemoglobin A1C No results for input(s): HGBA1C in the last 72 hours. Fasting Lipid Panel No results for input(s): CHOL, HDL, LDLCALC, TRIG, CHOLHDL, LDLDIRECT in the last 72 hours. Thyroid Function Tests  Recent Labs  08/08/15 1729  TSH 1.039    TELE  2:1 AV block  ECG  NSR with 2:1 Av block and RBBB  Radiology/Studies  Dg Chest 2 View  08/08/2015   CLINICAL DATA:  Cough.  Bradycardia.  Presyncope.  EXAM: CHEST  2 VIEW  COMPARISON:  04/16/2015  FINDINGS: The heart size and mediastinal contours are within normal limits. Both lungs are clear. Mild thoracic spine degenerative changes again noted.  IMPRESSION: No active cardiopulmonary disease.   Electronically Signed   By: Earle Gell M.D.   On: 08/08/2015 15:40   Dg Chest Port 1 View  08/08/2015   CLINICAL DATA:  He has only fainted once, but has felt that he might pass out other times. He saw Dr. Gwenlyn Found 12/15 due  to pre-syncope; at that time his sx were thought to be due to orthostasis, he was noted to have a right BBB, no further w/u needed Today he tried running some stairs and checked his pulse- however it did not seem that it got any higher to him even with exercise. Patient states no chest pain. SOB  EXAM: PORTABLE CHEST - 1 VIEW  COMPARISON:  08/08/2015 at 1345 hours  FINDINGS: Cardiac silhouette normal in size and configuration. Normal mediastinal and hilar contours.  No lung consolidation or evidence of pulmonary edema. No pleural effusion or pneumothorax.  Bony thorax is intact.  No significant change from the earlier study.  IMPRESSION: No active disease.   Electronically Signed   By: Lajean Manes M.D.   On: 08/08/2015 17:34    ASSESSMENT AND PLAN  1. Symptomatic 2:1 AV block - his metoprolol has been discontinued for over a week. He presented with dizziness and sob and a HR of 35/min with 2:1 Av block. His  heart block worsens as his sinus rate increased consistent with infranodal disease. Will plan to proceed with PPM. I have discussed the risks/benefits/goals/expectations of the procedure with the patient and he wishes to proceed.   Gregg Taylor,M.D.  08/09/2015 12:15 PM

## 2015-08-09 NOTE — Care Management Note (Signed)
Case Management Note  Patient Details  Name: Michael Seeley Sr. MRN: [id number] Date of Birth: [birth year]  Subjective/Objective:   Adm w heart block, for pacer insert 8-22                 Action/Plan: lives w fam, pcp dr Marin Olp   Expected Discharge Date:  08/11/15               Expected Discharge Plan:  Home/Self Care  In-House Referral:     Discharge planning Services     Post Acute Care Choice:    Choice offered to:     DME Arranged:    DME Agency:     HH Arranged:    [place] Agency:     Status of Service:     Medicare Important Message Given:    Date Medicare IM Given:    Medicare IM give by:    Date Additional Medicare IM Given:    Additional Medicare Important Message give by:     If discussed at [place] of Stay Meetings, dates discussed:    Additional Comments: ur rview done  Lacretia Leigh, RN 08/09/2015, 10:08 AM

## 2015-08-09 NOTE — Discharge Instructions (Signed)
° ° °  Supplemental Discharge Instructions for  Pacemaker/Defibrillator Patients  Activity No heavy lifting or vigorous activity with your left/right arm for 6 to 8 weeks.  Do not raise your left/right arm above your head for one week.  Gradually raise your affected arm as drawn below.           __      08/13/15                   08/14/15                     08/15/15                   08/16/15  NO DRIVING for   1 week  ; you may begin driving on 3/89/37    .  WOUND CARE - Keep the wound area clean and dry.  Do not get this area wet for one week. No showers for one week; you may shower on  08/16/15   . - The tape/steri-strips on your wound will fall off; do not pull them off.  No bandage is needed on the site.  DO  NOT apply any creams, oils, or ointments to the wound area. - If you notice any drainage or discharge from the wound, any swelling or bruising at the site, or you develop a fever > 101? F after you are discharged home, call the office at once.  Special Instructions - You are still able to use cellular telephones; use the ear opposite the side where you have your pacemaker/defibrillator.  Avoid carrying your cellular phone near your device. - When traveling through airports, show security personnel your identification card to avoid being screened in the metal detectors.  Ask the security personnel to use the hand wand. - Avoid arc welding equipment, MRI testing (magnetic resonance imaging), TENS units (transcutaneous nerve stimulators).  Call the office for questions about other devices. - Avoid electrical appliances that are in poor condition or are not properly grounded. - Microwave ovens are safe to be near or to operate.

## 2015-08-09 NOTE — Progress Notes (Signed)
  Echocardiogram 2D Echocardiogram has been performed.  Roseanna Rainbow R 08/09/2015, 10:00 AM

## 2015-08-09 NOTE — Telephone Encounter (Signed)
Pt's wife will contact the office after the surgery to reschedule appt.

## 2015-08-09 NOTE — Significant Event (Signed)
Patient transferred to the floor straight from cath lab. RN called the floor RN to ensure all is okay. Per receiving RN, report was given already by cath lab staff and that patient is settled in the new unit. Eleana Tocco, Therapist, sports.

## 2015-08-09 NOTE — Progress Notes (Addendum)
MD Aundra Dubin paged d/t high BPs, requested PRN meds to lower pressure or further orders to manage pt. Pt in no distress & asymptomatic. Will await return call & continue to monitor pt closely.

## 2015-08-10 ENCOUNTER — Encounter (HOSPITAL_COMMUNITY): Payer: Self-pay | Admitting: Internal Medicine

## 2015-08-10 ENCOUNTER — Inpatient Hospital Stay (HOSPITAL_COMMUNITY): Payer: Medicare Other

## 2015-08-10 ENCOUNTER — Telehealth: Payer: Self-pay

## 2015-08-10 DIAGNOSIS — C9000 Multiple myeloma not having achieved remission: Secondary | ICD-10-CM

## 2015-08-10 DIAGNOSIS — Z950 Presence of cardiac pacemaker: Secondary | ICD-10-CM

## 2015-08-10 LAB — GLUCOSE, CAPILLARY
GLUCOSE-CAPILLARY: 187 mg/dL — AB (ref 65–99)
Glucose-Capillary: 202 mg/dL — ABNORMAL HIGH (ref 65–99)

## 2015-08-10 MED ORDER — CARVEDILOL 12.5 MG PO TABS: 12.5000 mg | ORAL_TABLET | Freq: Two times a day (BID) | ORAL | Status: DC

## 2015-08-10 NOTE — Progress Notes (Addendum)
Assumed care from off going RN @ [phone]; patient alert & oriented: @0903  patient rhythm changed to wider QRS and some ST Elevation ; patient shows no symptoms ; has no c/o's of chest pain; BP elevated 181/109; Music therapist paged; no new orders; BP medication given; discharge later in morning  @1317  discharge instructions given to patient; no questions at this time

## 2015-08-10 NOTE — Consult Note (Signed)
[facility]  [phone]   Patient Care Team: Volanda Napoleon, MD as PCP - General (Oncology) Edrick Oh, MD (Nephrology) Macarthur Critchley, [place] as Consulting Physician (Optometry) Volanda Napoleon, MD as Consulting Physician (Oncology)  HOSPITAL CONSULT  NOTE  HPI: [name] is 69 year old gentleman with IgG kappa smoldering myeloma, on observation, admitted by the Cardiology team on 8/22 with symptomatic Mobitz 2  AV block with rate between 30s and 40s, requiring STJ dual chamber pacemaker placement. He had increasing dizziness with a syncopal episode on presentation. Denies fevers, chills, night sweats, vision changes, or mucositis.On admission he was short of breath as well.  Denies lower extremity swelling. Denies nausea, heartburn or change in bowel habits Denies abdominal pain. Appetite is normal. Denies any dysuria. Denies abnormal skin rashes, or neuropathy. Denies any bleeding issues such as epistaxis, hematemesis, hematuria or hematochezia.His CXR is negative for pneumothorax  After procedure, or active disease.Echocardiogram on 08/09/15 demonstrated EF 28-76%, grade 1 diastolic dysfunction, calcified MV annulus We have been kindly informed of the patient's admission.   Principle Diagnosis:   IgG kappa smoldering myeloma  Iron deficiency anemia  Current Therapy:   Observation A bone marrow test done on him back in September 2014 showed 14% plasma cells. Cytogenetics were normal  MEDICATIONS: Scheduled Meds: . aspirin EC  81 mg Oral Daily  . carvedilol  6.25 mg Oral BID WC  . ferrous sulfate  325 mg Oral QODAY  . glipiZIDE  5 mg Oral BID  . insulin aspart  0-15 Units Subcutaneous TID WC  . insulin aspart  0-5 Units Subcutaneous QHS  . multivitamin with minerals  1 tablet Oral q morning - 10a   Continuous Infusions:  PRN Meds:.acetaminophen, hydrALAZINE, HYDROcodone-acetaminophen, nitroGLYCERIN, ondansetron (ZOFRAN) IV, ondansetron (ZOFRAN)  IV, zolpidem   ALLERGIES:   Allergies  Allergen Reactions  . Amlodipine Swelling  . Crestor [Rosuvastatin] Other (See Comments)    Per pt unable to focus     PHYSICAL EXAMINATION:  Filed Vitals:   08/10/15 0900  BP: 181/109  Pulse:   Temp:   Resp:    Filed Weights   08/08/15 1900 08/09/15 0400  Weight: 210 lb 12.2 oz (95.6 kg) 208 lb 8 oz (94.575 kg)    GENERAL:alert, no distress and comfortable SKIN: skin color, texture, turgor are normal, no rashes or significant lesions EYES: normal, conjunctiva are pink and non-injected, sclera clear OROPHARYNX:no exudate, no erythema and lips, buccal mucosa, and tongue normal  NECK: supple, thyroid normal size, non-tender, without nodularity LYMPH:  no palpable lymphadenopathy in the cervical, axillary or inguinal LUNGS: clear to auscultation and percussion with normal breathing effort HEART: regular rate & rhythm and no murmurs and no lower extremity edema ABDOMEN: soft, non-tender and normal bowel sounds Musculoskeletal:no cyanosis of digits and no clubbing  PSYCH: alert & oriented x 3 with fluent speech NEURO: no focal motor/sensory deficits   LABORATORY/RADIOLOGY DATA:   Recent Labs Lab 08/08/15 1707  WBC 7.8  HGB 11.6*  HCT 34.2*  PLT 236  MCV 86.8  MCH 29.4  MCHC 33.9  RDW 13.8  LYMPHSABS 2.8  MONOABS 0.7  EOSABS 0.2  BASOSABS 0.1    CMP    Recent Labs Lab 08/08/15 1707 08/09/15 0713  NA 134* 136  K 4.0 3.9  CL 102 103  CO2 21* 23  GLUCOSE 186* 197*  BUN 15 13  CREATININE 0.98 0.87  CALCIUM 9.1 9.1  AST 32  --   ALT  26  --   ALKPHOS 48  --   BILITOT 0.6  --         Component Value Date/Time   BILITOT 0.6 08/08/2015 1707   BILITOT 0.60 03/09/2015 1111   BILITOT 0.23 07/18/2013 0907     Recent Labs  08/08/15 1729  TSH 1.039       Recent Labs Lab 08/08/15 1707  INR 1.12      Urinalysis    Component Value Date/Time   BILIRUBINUR neg 07/31/2015 1103   PROTEINUR 100  07/31/2015 1103   UROBILINOGEN 0.2 07/31/2015 1103   NITRITE neg 07/31/2015 1103   LEUKOCYTESUR Negative 07/31/2015 1103     Liver Function Tests:  Recent Labs Lab 08/08/15 1707  AST 32  ALT 26  ALKPHOS 48  BILITOT 0.6  PROT 6.9  ALBUMIN 3.5   CBG:  Recent Labs Lab 08/09/15 1346 08/09/15 1644 08/09/15 2157 08/10/15 0722 08/10/15 1122  GLUCAP 193* 252* 305* 202* 187*    Thyroid function studies  Recent Labs  08/08/15 1729  TSH 1.039    Radiology Studies:  Dg Chest 2 View  08/10/2015   CLINICAL DATA:  Pacemaker.  EXAM: CHEST  2 VIEW  COMPARISON:  08/08/2015, 04/16/2015.  CT 12/26/2011 P  FINDINGS: Mediastinum and hilar structures are normal. Cardiac pacer with lead tips in right atrium right ventricle. No complicating features . Heart size normal. Lungs are clear of acute infiltrates. No pleural effusion or pneumothorax. Questionable faint nodular opacities noted over the right upper lobe and left lower lobe. Nodular opacity over the left lower lobe may represent overlying nipple shadow. Given the presence of pulmonary nodules on CT of 12/26/2011 for which follow-up was suggested, it is recommended that nonemergent nonenhanced chest CT be obtained for further evaluation . No pleural effusion or pneumothorax. No acute bony abnormality. What appears to be a catheter is projected over the anterior abdomen on lateral view.  IMPRESSION: 1. Questionable nodular opacity right upper lobe and left lower lobe. Left lower lobe nodular opacity may represent overlapping nipple shadow. Given the presence of pulmonary nodules on CT 12/26/2011 for which follow-up was suggested, it is recommended that nonemergent nonenhanced chest CT be be obtained for further evaluation. 2. No acute cardiopulmonary disease. Interim placement of cardiac pacer. Pacer leads are in the right atrium and right ventricle. No complicating features.   Electronically Signed   By: Marcello Moores  Register   On: 08/10/2015 07:51    Dg Chest 2 View  08/08/2015   CLINICAL DATA:  Cough.  Bradycardia.  Presyncope.  EXAM: CHEST  2 VIEW  COMPARISON:  04/16/2015  FINDINGS: The heart size and mediastinal contours are within normal limits. Both lungs are clear. Mild thoracic spine degenerative changes again noted.  IMPRESSION: No active cardiopulmonary disease.   Electronically Signed   By: Earle Gell M.D.   On: 08/08/2015 15:40   Dg Chest Port 1 View  08/08/2015   CLINICAL DATA:  He has only fainted once, but has felt that he might pass out other times. He saw Dr. Gwenlyn Found 12/15 due to pre-syncope; at that time his sx were thought to be due to orthostasis, he was noted to have a right BBB, no further w/u needed Today he tried running some stairs and checked his pulse- however it did not seem that it got any higher to him even with exercise. Patient states no chest pain. SOB  EXAM: PORTABLE CHEST - 1 VIEW  COMPARISON:  08/08/2015 at 1345 hours  FINDINGS: Cardiac silhouette normal in size and configuration. Normal mediastinal and hilar contours.  No lung consolidation or evidence of pulmonary edema. No pleural effusion or pneumothorax.  Bony thorax is intact.  No significant change from the earlier study.  IMPRESSION: No active disease.   Electronically Signed   By: Lajean Manes M.D.   On: 08/08/2015 17:34       ASSESSMENT AND PLAN:  IgG kappa smoldering myeloma,  On observation Will follow as outpatient as scheduled  symptomatic Mobitz 2  AV block S/p STJ dual chamber pacemaker placement As per [facility]  Disposition He is being discharged to home in stable condition Will follow with Dr. Marin Olp as scheduled   Full Code Other medical issues as per admitting team.      Rondel Jumbo, PA-C 08/10/2015, 12:56 PM  ADDENDUM:  I agree with the above note by Clarise Cruz. I don't see that the myeloma is a factor here. He does not have any amyloid component to his myeloma. Typically, amyloid can certainly affect cardiac function.  Typically, it affects the heart with respect to restrictive cardiomyopathy and not arrhythmias.  I very much appreciate all the great care that he received while on the cardiology service. They did a great job in trying to get his heart back into rhythm. He had a pacemaker placed.  We will continue to follow him along as we have been doing. He will keep his regular appointment with [place].  Laurey Arrow

## 2015-08-10 NOTE — Telephone Encounter (Signed)
Patient wanted to let Dr Lorelei Pont know he went to [facility], got his pace maker and he is now at home. He also wanted to let our office know his cardiologist is Dr Cristopher Peru. Patient's call back number is [phone]

## 2015-08-10 NOTE — Discharge Summary (Signed)
ELECTROPHYSIOLOGY PROCEDURE DISCHARGE SUMMARY    Patient ID: Michael Glosser Sr.,  MRN: [id number], DOB/AGE: [birth year] 70 y.o.  Admit date: 08/08/2015 Discharge date: 08/10/2015  Primary Care Physician: Volanda Napoleon, MD Primary Cardiologist: Gwenlyn Found Electrophysiologist: Lovena Le  Primary Discharge Diagnosis:  2:1 heart block status post pacemaker implantation this admission  Secondary Discharge Diagnosis:  1.  Hypertension 2.  Hyperlipidemia 3.  Diabetes 4.  Hx of multiple myeloma 5.  RBBB 6.  Remote history of rheumatic fever  Allergies  Allergen Reactions  . Amlodipine Swelling  . Crestor [Rosuvastatin] Other (See Comments)    Per pt unable to focus     Procedures This Admission:  1.  Echocardiogram on 08/09/15 demonstrated EF 57-26%, grade 1 diastolic dysfunction, calcified MV annulus 2.  PPM implantation on 08/09/15 by Dr Lovena Le. The patient received a STJ dual chamber pacemaker.  See op note for full details. There were no early apparent complications 3.  CXR on 08/10/15 demonstrated no ptx s/p device implant  Brief HPI/Hospital Course:  Michael Liew Sr. is a 69 y.o. male with a past medical history as outlined above. He has been evaluated by his PCP for bradycardia and his Metoprolol was discontinued >1 week prior to admission. On the day of admission, he was seen at Urgent Care for pre-syncope. EKG demonstrated 2:1 heart block and he was transferred to [facility] for further evaluation.  He was evaluated by Dr Lovena Le who recommended pacemaker implantation with no reversible causes for bradycardia identified. He underwent PPM implantation on 08/09/15 with details as outlined above. He was monitored on telemetry overnight which demonstrated sinus rhythm with occasional ventricular pacing.  CXR post procedure demonstrated no ptx.  Device was interrogated and functioning normally.  He was evaluated by Dr Curt Bears who considered him stable for discharge to home. With increased BP, he  will be discharged on Coreg 12.29m twice daily and will have a BP check at wound check appt.   Physical Exam: Filed Vitals:   08/10/15 0035 08/10/15 0432 08/10/15 0721 08/10/15 0900  BP: 156/87 152/97 160/108 181/109  Pulse: 62 81 79   Temp: 98.4 F (36.9 C) 97.9 F (36.6 C) 98.7 F (37.1 C)   TempSrc: Oral Oral Oral   Resp:      Height:      Weight:      SpO2: 96% 98% 97%     GEN- The patient is elderly appearing, alert and oriented x 3 today.   HEENT: normocephalic, atraumatic; sclera clear, conjunctiva pink; hearing intact; oropharynx clear; neck supple  Lungs- Clear to ausculation bilaterally, normal work of breathing.  No wheezes, rales, rhonchi Heart- Regular rate and rhythm (paced) GI- soft, non-tender, non-distended, bowel sounds present  Extremities- no clubbing, cyanosis, or edema; DP/PT/radial pulses 2+ bilaterally MS- no significant deformity or atrophy Skin- warm and dry, no rash or lesion, left chest without hematoma/ecchymosis Psych- euthymic mood, full affect Neuro- strength and sensation are intact    Labs:   Lab Results  Component Value Date   WBC 7.8 08/08/2015   HGB 11.6* 08/08/2015   HCT 34.2* 08/08/2015   MCV 86.8 08/08/2015   PLT 236 08/08/2015     Recent Labs Lab 08/08/15 1707 08/09/15 0713  NA 134* 136  K 4.0 3.9  CL 102 103  CO2 21* 23  BUN 15 13  CREATININE 0.98 0.87  CALCIUM 9.1 9.1  PROT 6.9  --   BILITOT 0.6  --   ALKPHOS 48  --  ALT 26  --   AST 32  --   GLUCOSE 186* 197*     Discharge Medications:    Medication List    STOP taking these medications        metoprolol tartrate 25 MG tablet  Commonly known as:  LOPRESSOR      TAKE these medications        carvedilol 12.5 MG tablet  Commonly known as:  COREG  Take 1 tablet (12.5 mg total) by mouth 2 (two) times daily with a meal.     diphenhydramine-acetaminophen 25-500 MG Tabs  Commonly known as:  TYLENOL PM  Take 1 tablet by mouth at bedtime as needed  (sleep).     ferrous sulfate 325 (65 FE) MG tablet  Take 325 mg by mouth every other day.     FISH OIL PO  Take 1 capsule by mouth every other day.     glipiZIDE 5 MG 24 hr tablet  Commonly known as:  GLUCOTROL XL  Take 5 mg by mouth 2 (two) times daily.     HYDROcodone-acetaminophen 5-325 MG per tablet  Commonly known as:  NORCO/VICODIN  Take 1 tablet by mouth every 6 (six) hours as needed for moderate pain.     Insulin Pen Needle 31G X 5 MM Misc  1 Units by Does not apply route daily.     Lancets Misc  Use as instructed twice a day     Liraglutide 18 MG/3ML Sopn  Inject 0.6 mg daily for one week then increase to 1.2 mg daily for one week finally increased to 1.8 following that  second week     losartan 50 MG tablet  Commonly known as:  COZAAR  Start with a 1/2 tablet daily and go to a whole tablet as needed     metFORMIN 1000 MG tablet  Commonly known as:  GLUCOPHAGE  TAKE 1 TABLET BY MOUTH TWICE A DAY WITH MEALS.     multivitamin with minerals Tabs tablet  Take 1 tablet by mouth daily.     ONE TOUCH ULTRA TEST test strip  Generic drug:  glucose blood  USE AS INSTRUCTED TWICE A DAY     TRIPLE FLEX PO  Take 1 tablet by mouth daily.        Disposition:  Discharge Instructions    Diet - low sodium heart healthy    Complete by:  As directed      Increase activity slowly    Complete by:  As directed           Follow-up Information    Follow up with [facility] On 08/18/2015.   Why:  at 4:30PM for wound check   Contact information:   [address] [place] [phone]       Follow up with Cristopher Peru, MD On 11/10/2015.   Specialty:  Cardiology   Why:  at 9:15AM   Contact information:   [address] [place] [address] [phone]       Duration of Discharge Encounter: Greater than 30 minutes including physician time.  Signed, Chanetta Marshall, NP 08/10/2015 11:09 AM   I have seen and  examined this patient with Chanetta Marshall.  Agree with above, note added to reflect my findings.  On exam, regular rhythm, clear lungs.  Had pacemaker placed yesterday.  Wound clean, CXR shows leads in place, no pneumothorax.  Plan to discharge home.    Will M.  [place] MD 08/10/2015 2:58 PM

## 2015-08-11 NOTE — Telephone Encounter (Signed)
Called and LMOM- I am glad that he is home and doing better!

## 2015-08-18 ENCOUNTER — Ambulatory Visit (INDEPENDENT_AMBULATORY_CARE_PROVIDER_SITE_OTHER): Payer: Medicare Other | Admitting: *Deleted

## 2015-08-18 ENCOUNTER — Encounter: Payer: Self-pay | Admitting: Internal Medicine

## 2015-08-18 VITALS — BP 130/74 | HR 79

## 2015-08-18 DIAGNOSIS — I459 Conduction disorder, unspecified: Secondary | ICD-10-CM

## 2015-08-18 DIAGNOSIS — I441 Atrioventricular block, second degree: Secondary | ICD-10-CM

## 2015-08-18 LAB — CUP PACEART INCLINIC DEVICE CHECK
Lead Channel Impedance Value: 587.5 Ohm
Lead Channel Pacing Threshold Amplitude: 1 V
Lead Channel Pacing Threshold Pulse Width: 0.4 ms
Lead Channel Sensing Intrinsic Amplitude: 12 mV
Lead Channel Setting Sensing Sensitivity: 2 mV
MDC IDC MSMT BATTERY REMAINING LONGEVITY: 141.6 mo
MDC IDC MSMT BATTERY VOLTAGE: 3.08 V
MDC IDC MSMT LEADCHNL RA IMPEDANCE VALUE: 512.5 Ohm
MDC IDC MSMT LEADCHNL RA PACING THRESHOLD AMPLITUDE: 1.75 V
MDC IDC MSMT LEADCHNL RA PACING THRESHOLD PULSEWIDTH: 0.4 ms
MDC IDC MSMT LEADCHNL RA SENSING INTR AMPL: 3.6 mV
MDC IDC SESS DTM: 20160831165914
MDC IDC SET LEADCHNL RA PACING AMPLITUDE: 3.125
MDC IDC SET LEADCHNL RV PACING AMPLITUDE: 1.25 V
MDC IDC SET LEADCHNL RV PACING PULSEWIDTH: 0.4 ms
MDC IDC STAT BRADY RA PERCENT PACED: 2.9 %
MDC IDC STAT BRADY RV PERCENT PACED: 11 %
Pulse Gen Model: 2240
Pulse Gen Serial Number: 7799340

## 2015-08-18 NOTE — Progress Notes (Signed)
Wound check appointment. Steri-strips removed. Wound without redness or edema. Incision edges approximated, wound well healed. Normal device function. Thresholds, sensing, and impedances consistent with implant measurements. Device programmed with auto capture programmed on for extra safety margin until 3 month visit. Histogram distribution appropriate for patient and level of activity. No mode switches or high ventricular rates noted. Patient educated about wound care, arm mobility, lifting restrictions. ROV in 11/10/15 with [facility].

## 2015-09-16 ENCOUNTER — Ambulatory Visit (HOSPITAL_BASED_OUTPATIENT_CLINIC_OR_DEPARTMENT_OTHER): Payer: Medicare Other

## 2015-09-16 ENCOUNTER — Encounter: Payer: Self-pay | Admitting: Hematology & Oncology

## 2015-09-16 ENCOUNTER — Ambulatory Visit (HOSPITAL_BASED_OUTPATIENT_CLINIC_OR_DEPARTMENT_OTHER): Payer: Medicare Other | Admitting: Hematology & Oncology

## 2015-09-16 VITALS — BP 142/94 | HR 70 | Temp 97.6°F | Resp 16 | Ht 75.0 in | Wt 216.0 lb

## 2015-09-16 DIAGNOSIS — C9000 Multiple myeloma not having achieved remission: Secondary | ICD-10-CM

## 2015-09-16 DIAGNOSIS — Z23 Encounter for immunization: Secondary | ICD-10-CM

## 2015-09-16 LAB — CBC WITH DIFFERENTIAL (CANCER CENTER ONLY)
BASO#: 0 10*3/uL (ref 0.0–0.2)
BASO%: 0.5 % (ref 0.0–2.0)
EOS%: 4.5 % (ref 0.0–7.0)
Eosinophils Absolute: 0.3 10*3/uL (ref 0.0–0.5)
HCT: 35.5 % — ABNORMAL LOW (ref 38.7–49.9)
HGB: 11.9 g/dL — ABNORMAL LOW (ref 13.0–17.1)
LYMPH#: 2.4 10*3/uL (ref 0.9–3.3)
LYMPH%: 35.6 % (ref 14.0–48.0)
MCH: 29.1 pg (ref 28.0–33.4)
MCHC: 33.5 g/dL (ref 32.0–35.9)
MCV: 87 fL (ref 82–98)
MONO#: 0.6 10*3/uL (ref 0.1–0.9)
MONO%: 9.3 % (ref 0.0–13.0)
NEUT#: 3.3 10*3/uL (ref 1.5–6.5)
NEUT%: 50.1 % (ref 40.0–80.0)
PLATELETS: 197 10*3/uL (ref 145–400)
RBC: 4.09 10*6/uL — AB (ref 4.20–5.70)
RDW: 13.2 % (ref 11.1–15.7)
WBC: 6.7 10*3/uL (ref 4.0–10.0)

## 2015-09-16 LAB — CHCC SATELLITE - SMEAR

## 2015-09-16 LAB — COMPREHENSIVE METABOLIC PANEL (CC13)
ALT: 20 U/L (ref 0–55)
AST: 18 U/L (ref 5–34)
Albumin: 3.7 g/dL (ref 3.5–5.0)
Alkaline Phosphatase: 51 U/L (ref 40–150)
Anion Gap: 6 mEq/L (ref 3–11)
BUN: 16.2 mg/dL (ref 7.0–26.0)
CHLORIDE: 102 meq/L (ref 98–109)
CO2: 27 meq/L (ref 22–29)
Calcium: 9.7 mg/dL (ref 8.4–10.4)
Creatinine: 1.1 mg/dL (ref 0.7–1.3)
EGFR: 81 mL/min/{1.73_m2} — AB (ref >90)
GLUCOSE: 172 mg/dL — AB (ref 70–140)
POTASSIUM: 4.3 meq/L (ref 3.5–5.1)
SODIUM: 135 meq/L — AB (ref 136–145)
Total Bilirubin: 0.34 mg/dL (ref 0.20–1.20)
Total Protein: 7.4 g/dL (ref 6.4–8.3)

## 2015-09-16 MED ORDER — INFLUENZA VAC SPLIT QUAD 0.5 ML IM SUSY
0.5000 mL | PREFILLED_SYRINGE | Freq: Once | INTRAMUSCULAR | Status: AC
  Administered 2015-09-16: 0.5 mL via INTRAMUSCULAR
  Filled 2015-09-16: qty 0.5

## 2015-09-16 NOTE — Progress Notes (Signed)
Hematology and Oncology Follow Up Visit  Michael Saraceno Sr. [id number] February 27, 1946 69 y.o. 09/16/2015   Principle Diagnosis:   IgG kappa smoldering myeloma  Iron deficiency anemia  Current Therapy:    Observation     Interim History:  [name] is back for followup. He now has a pacemaker placed. This was inserted a couple weeks ago. I think he had an episode of syncope. He has heart block.  His last myeloma studies done back in March showed a monoclonal spike of 0.5 g/dL. His IgG level was 1630 mg/dL. His kappa light chain was 4 mg/dL. Overall, these are relatively stable.  He is still working. He's having no problems with leg swelling. He is having no problems with bowels or bladder.  He does have diabetes. I am not sure how well this is managed.  He's not had any fever. He's had no cough. There's been no mouth sores. He's had no headache.  Overall, his performance status is ECOG 1. Medications:  Current outpatient prescriptions:  .  carvedilol (COREG) 12.5 MG tablet, Take 1 tablet (12.5 mg total) by mouth 2 (two) times daily with a meal., Disp: 60 tablet, Rfl: 3 .  diphenhydramine-acetaminophen (TYLENOL PM) 25-500 MG TABS, Take 1 tablet by mouth at bedtime as needed (sleep)., Disp: , Rfl:  .  ferrous sulfate 325 (65 FE) MG tablet, Take 325 mg by mouth every other day., Disp: , Rfl:  .  glipiZIDE (GLUCOTROL XL) 5 MG 24 hr tablet, Take 5 mg by mouth 2 (two) times daily., Disp: , Rfl: 0 .  Glucosamine-Chondroitin-MSM (TRIPLE FLEX PO), Take 1 tablet by mouth daily. , Disp: , Rfl:  .  HYDROcodone-acetaminophen (NORCO/VICODIN) 5-325 MG per tablet, Take 1 tablet by mouth every 6 (six) hours as needed for moderate pain., Disp: 20 tablet, Rfl: 0 .  Insulin Pen Needle 31G X 5 MM MISC, 1 Units by Does not apply route daily., Disp: 100 each, Rfl: 5 .  Lancets MISC, Use as instructed twice a day, Disp: 100 each, Rfl: 2 .  Liraglutide 18 MG/3ML SOPN, Inject 0.6 mg daily for one week then  increase to 1.2 mg daily for one week finally increased to 1.8 following that  second week, Disp: 3 pen, Rfl: 5 .  losartan (COZAAR) 50 MG tablet, Start with a 1/2 tablet daily and go to a whole tablet as needed (Patient taking differently: Take 25 mg by mouth daily. ), Disp: 90 tablet, Rfl: 3 .  metFORMIN (GLUCOPHAGE) 1000 MG tablet, TAKE 1 TABLET BY MOUTH TWICE A DAY WITH MEALS. (Patient taking differently: Take 1,000 mg by mouth 2 (two) times daily with a meal. ), Disp: 180 tablet, Rfl: 3 .  Multiple Vitamin (MULTIVITAMIN WITH MINERALS) TABS, Take 1 tablet by mouth daily. , Disp: , Rfl:  .  Omega-3 Fatty Acids (FISH OIL PO), Take 1 capsule by mouth every other day., Disp: , Rfl:  .  ONE TOUCH ULTRA TEST test strip, USE AS INSTRUCTED TWICE A DAY, Disp: 100 each, Rfl: 11  Allergies:  Allergies  Allergen Reactions  . Amlodipine Swelling  . Crestor [Rosuvastatin] Other (See Comments)    Per pt unable to focus    Past Medical History, Surgical history, Social history, and Family History were reviewed and updated.  Review of Systems: As above  Physical Exam:  height is 6\' 3"  (1.905 m) and weight is 216 lb (97.977 kg). His oral temperature is 97.6 F (36.4 C). His blood pressure is 142/94  and his pulse is 70. His respiration is 16.   Well-developed well-nourished Afro-American gentleman. Lungs are clear. Cardiac exam shows a regular rate and rhythm with no murmurs rubs or bruits. Head and neck exam shows no sclera icterus. He has no adenopathy in the neck. Abdomen is soft. Has good bowel sounds. There is no fluid wave. There is no palpable liver or spleen tip. Back exam shows no tenderness over the spine, ribs or hips. Extremities shows no clubbing cyanosis or edema. He has good range of motion of his joints. He has good strength in his extremities. Neurological exam shows no focal neurological deficits. Skin exam no rashes.  Lab Results  Component Value Date   WBC 6.7 09/16/2015   HGB 11.9*  09/16/2015   HCT 35.5* 09/16/2015   MCV 87 09/16/2015   PLT 197 09/16/2015     Chemistry      Component Value Date/Time   NA 136 08/09/2015 0713   NA 138 03/09/2015 1111   NA 135* 07/18/2013 0907   K 3.9 08/09/2015 0713   K 4.3 03/09/2015 1111   K 3.8 07/18/2013 0907   CL 103 08/09/2015 0713   CL 98 03/09/2015 1111   CL 108* 05/15/2013 0912   CO2 23 08/09/2015 0713   CO2 30 03/09/2015 1111   CO2 23 07/18/2013 0907   BUN 13 08/09/2015 0713   BUN 10 03/09/2015 1111   BUN 22.3 07/18/2013 0907   CREATININE 0.87 08/09/2015 0713   CREATININE 1.01 07/31/2015 1059   CREATININE 1.1 07/18/2013 0907      Component Value Date/Time   CALCIUM 9.1 08/09/2015 0713   CALCIUM 9.6 03/09/2015 1111   CALCIUM 9.0 07/18/2013 0907   ALKPHOS 48 08/08/2015 1707   ALKPHOS 54 03/09/2015 1111   ALKPHOS 62 07/18/2013 0907   AST 32 08/08/2015 1707   AST 18 03/09/2015 1111   AST 10 07/18/2013 0907   ALT 26 08/08/2015 1707   ALT 18 03/09/2015 1111   ALT 14 07/18/2013 0907   BILITOT 0.6 08/08/2015 1707   BILITOT 0.60 03/09/2015 1111   BILITOT 0.23 07/18/2013 0907         Impression and Plan: [name] is 69 year old gentleman with IgG kappa smoldering myeloma. So far, I do not see any evidence of progressive disease. His myeloma numbers are slowly trending upward. His total protein level was on the higher side this time.We will have to watch this.  For now, we will plan to get him back in another 4-6 months.  We will see what his myeloma studies show.  I'm glad that he had this pacemaker placed. This hopefully will improve his quality of life.   Volanda Napoleon, MD 9/29/20163:37 PM

## 2015-09-16 NOTE — Patient Instructions (Signed)

## 2015-09-17 ENCOUNTER — Telehealth: Payer: Self-pay | Admitting: *Deleted

## 2015-09-17 LAB — IRON AND TIBC CHCC
%SAT: 29 % (ref 20–55)
Iron: 92 ug/dL (ref 42–163)
TIBC: 315 ug/dL (ref 202–409)
UIBC: 224 ug/dL (ref 117–376)

## 2015-09-17 LAB — FERRITIN CHCC: Ferritin: 174 ng/ml (ref 22–316)

## 2015-09-17 NOTE — Telephone Encounter (Addendum)
Patient is aware of results.   ----- Message from Michael Napoleon, MD sent at 09/17/2015 10:07 AM EDT ----- Call - iron level is ok!!!  Michael Wiggins

## 2015-09-20 LAB — IGG, IGA, IGM
IGA: 340 mg/dL (ref 68–379)
IGM, SERUM: 22 mg/dL — AB (ref 41–251)
IgG (Immunoglobin G), Serum: 1690 mg/dL — ABNORMAL HIGH (ref 650–1600)

## 2015-09-20 LAB — PROTEIN ELECTROPHORESIS, SERUM, WITH REFLEX
ALBUMIN ELP: 3.9 g/dL (ref 3.8–4.8)
ALPHA-1-GLOBULIN: 0.2 g/dL (ref 0.2–0.3)
Abnormal Protein Band1: 0.6 g/dL
Alpha-2-Globulin: 0.7 g/dL (ref 0.5–0.9)
BETA 2: 0.4 g/dL (ref 0.2–0.5)
BETA GLOBULIN: 0.5 g/dL (ref 0.4–0.6)
Gamma Globulin: 1.4 g/dL (ref 0.8–1.7)
TOTAL PROTEIN, SERUM ELECTROPHOR: 7.2 g/dL (ref 6.1–8.1)

## 2015-09-20 LAB — KAPPA/LAMBDA LIGHT CHAINS
KAPPA FREE LGHT CHN: 3.38 mg/dL — AB (ref 0.33–1.94)
KAPPA LAMBDA RATIO: 2.05 — AB (ref 0.26–1.65)
LAMBDA FREE LGHT CHN: 1.65 mg/dL (ref 0.57–2.63)

## 2015-09-20 LAB — BETA 2 MICROGLOBULIN, SERUM: Beta-2 Microglobulin: 1.69 mg/L (ref <=2.51)

## 2015-09-20 LAB — LACTATE DEHYDROGENASE: LDH: 166 U/L (ref 94–250)

## 2015-09-20 LAB — IFE INTERPRETATION

## 2015-09-21 ENCOUNTER — Telehealth: Payer: Self-pay | Admitting: *Deleted

## 2015-09-21 NOTE — Telephone Encounter (Addendum)
Patient aware of results.  ----- Message from Volanda Napoleon, MD sent at 09/21/2015  7:05 AM EDT ----- Call - Myeloma is still at a very low level!!! No problems!!!  Laurey Arrow

## 2015-11-10 ENCOUNTER — Encounter: Payer: Medicare Other | Admitting: Internal Medicine

## 2015-12-06 ENCOUNTER — Ambulatory Visit: Payer: Medicare Other | Admitting: Family Medicine

## 2015-12-08 ENCOUNTER — Encounter: Payer: Self-pay | Admitting: Family Medicine

## 2015-12-08 ENCOUNTER — Ambulatory Visit (INDEPENDENT_AMBULATORY_CARE_PROVIDER_SITE_OTHER): Payer: Medicare Other | Admitting: Internal Medicine

## 2015-12-08 ENCOUNTER — Other Ambulatory Visit: Payer: Self-pay | Admitting: Nurse Practitioner

## 2015-12-08 ENCOUNTER — Encounter: Payer: Self-pay | Admitting: Internal Medicine

## 2015-12-08 ENCOUNTER — Ambulatory Visit (INDEPENDENT_AMBULATORY_CARE_PROVIDER_SITE_OTHER): Payer: Medicare Other | Admitting: Family Medicine

## 2015-12-08 VITALS — BP 152/106 | HR 109 | Ht 73.5 in | Wt 221.8 lb

## 2015-12-08 VITALS — BP 130/98 | HR 71 | Temp 97.8°F | Resp 16 | Ht 73.5 in | Wt 218.2 lb

## 2015-12-08 DIAGNOSIS — I10 Essential (primary) hypertension: Secondary | ICD-10-CM

## 2015-12-08 DIAGNOSIS — E1165 Type 2 diabetes mellitus with hyperglycemia: Secondary | ICD-10-CM

## 2015-12-08 DIAGNOSIS — Z119 Encounter for screening for infectious and parasitic diseases, unspecified: Secondary | ICD-10-CM | POA: Diagnosis not present

## 2015-12-08 DIAGNOSIS — E785 Hyperlipidemia, unspecified: Secondary | ICD-10-CM

## 2015-12-08 DIAGNOSIS — I459 Conduction disorder, unspecified: Secondary | ICD-10-CM

## 2015-12-08 DIAGNOSIS — Z950 Presence of cardiac pacemaker: Secondary | ICD-10-CM

## 2015-12-08 DIAGNOSIS — IMO0001 Reserved for inherently not codable concepts without codable children: Secondary | ICD-10-CM

## 2015-12-08 LAB — BASIC METABOLIC PANEL
BUN: 14 mg/dL (ref 7–25)
CALCIUM: 9.6 mg/dL (ref 8.6–10.3)
CHLORIDE: 96 mmol/L — AB (ref 98–110)
CO2: 26 mmol/L (ref 20–31)
CREATININE: 0.93 mg/dL (ref 0.70–1.18)
Glucose, Bld: 236 mg/dL — ABNORMAL HIGH (ref 65–99)
Potassium: 4.3 mmol/L (ref 3.5–5.3)
SODIUM: 131 mmol/L — AB (ref 135–146)

## 2015-12-08 LAB — CUP PACEART INCLINIC DEVICE CHECK
Brady Statistic RA Percent Paced: 5 %
Date Time Interrogation Session: 20161221110942
Implantable Lead Implant Date: 20160822
Implantable Lead Location: 753860
Lead Channel Impedance Value: 600 Ohm
Lead Channel Pacing Threshold Amplitude: 1.25 V
Lead Channel Pacing Threshold Amplitude: 1.25 V
Lead Channel Pacing Threshold Pulse Width: 0.4 ms
Lead Channel Pacing Threshold Pulse Width: 0.4 ms
Lead Channel Pacing Threshold Pulse Width: 0.4 ms
Lead Channel Sensing Intrinsic Amplitude: 2.7 mV
Lead Channel Setting Sensing Sensitivity: 2 mV
MDC IDC LEAD IMPLANT DT: 20160822
MDC IDC LEAD LOCATION: 753859
MDC IDC MSMT BATTERY REMAINING LONGEVITY: 136.8
MDC IDC MSMT BATTERY VOLTAGE: 3.02 V
MDC IDC MSMT LEADCHNL RA IMPEDANCE VALUE: 512.5 Ohm
MDC IDC MSMT LEADCHNL RV PACING THRESHOLD AMPLITUDE: 1.25 V
MDC IDC MSMT LEADCHNL RV PACING THRESHOLD AMPLITUDE: 1.25 V
MDC IDC MSMT LEADCHNL RV PACING THRESHOLD PULSEWIDTH: 0.4 ms
MDC IDC MSMT LEADCHNL RV SENSING INTR AMPL: 12 mV
MDC IDC SET LEADCHNL RA PACING AMPLITUDE: 2.125
MDC IDC SET LEADCHNL RV PACING AMPLITUDE: 1.375
MDC IDC SET LEADCHNL RV PACING PULSEWIDTH: 0.4 ms
MDC IDC STAT BRADY RV PERCENT PACED: 17 %
Pulse Gen Serial Number: 7799340

## 2015-12-08 LAB — HEMOGLOBIN A1C
HEMOGLOBIN A1C: 9.6 % — AB (ref <5.7)
MEAN PLASMA GLUCOSE: 229 mg/dL — AB (ref <117)

## 2015-12-08 NOTE — Assessment & Plan Note (Signed)
His [employer] DDD PM is working normally. Will recheck in several months. 

## 2015-12-08 NOTE — Patient Instructions (Signed)
Medication Instructions:  Your physician recommends that you continue on your current medications as directed. Please refer to the Current Medication list given to you today.   Labwork: None ordered   Testing/Procedures: None ordered   Follow-Up: Your physician wants you to follow-up in: 9 months with Dr Michael Wiggins Saliva will receive a reminder letter in the mail two months in advance. If you don't receive a letter, please call our office to schedule the follow-up appointment.   Remote monitoring is used to monitor your Pacemaker  from home. This monitoring reduces the number of office visits required to check your device to one time per year. It allows [place] to keep an eye on the functioning of your device to ensure it is working properly. You are scheduled for a device check from home on 03/08/16. You may send your transmission at any time that day. If you have a wireless device, the transmission will be sent automatically. After your physician reviews your transmission, you will receive a postcard with your next transmission date.    Any Other Special Instructions Will Be Listed Below (If Applicable).     If you need a refill on your cardiac medications before your next appointment, please call your pharmacy.

## 2015-12-08 NOTE — Progress Notes (Signed)
[facility] [address] [place] [address] [phone]  Date:  12/08/2015   Name:  Michael Mwangi Sr.   DOB:  Jul 24, 1946   MRN:  [id number]  PCP:  Volanda Napoleon, MD    Chief Complaint: Follow-up; Diabetes; and Hypertension   History of Present Illness:  Michael Strebeck Sr. is a 69 y.o. very pleasant male patient who presents with the following:  Gentleman with history of DM, MM, heart block, bradycardia here today for a recheck. He was last in my office in August when he was noted to have symptomatic bradycardia- he ended up getting admitted and had a pacemaker placed.    He has IgG kappa smoldering myeloma- current treatment is observation, his oncologist is [employer].    Lab Results  Component Value Date   HGBA1C 9.6 07/31/2015   His most recent A1c was above goal.   Flu shot is UTD, pneumonia shots and tdap areUTD.  He does need hep C screening  CMP done in September- renal function ok  He has no issues with his pacemaker.  His energy level is much better and he feels a lot better overall.   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 No further syncope or pre-syncope.  He is on glipizide '5mg'$  BID and metformin 1,000 BID.  He is not currently on insulin.  He was rx victoza but is not taking it because it was too expensive.    Wt Readings from Last 3 Encounters:  12/08/15 218 lb 3.2 oz (98.975 kg)  09/16/15 216 lb (97.977 kg)  08/09/15 208 lb 8 oz (94.575 kg)     Patient Active Problem List   Diagnosis Date Noted  . Heart block 08/08/2015  . Mobitz type 2 second degree heart block 08/08/2015  . Right bundle branch block 12/16/2014  . Syncope 12/16/2014  . Proteinuria due to type 2 diabetes mellitus ([place]) 11/12/2014  . Encounter for therapeutic drug monitoring 03/13/2013  . Multiple myeloma ([place]) 02/15/2013  . Edema 01/07/2013  . Finger laceration 08/21/2012  . Diabetes mellitus due to underlying condition with other diabetic  kidney complication ([place])   . Hyperlipidemia   . Hypertension   . MGUS (monoclonal gammopathy of unknown significance)   . GERD (gastroesophageal reflux disease)     Past Medical History  Diagnosis Date  . Diabetes mellitus   . Hyperlipidemia   . Hypertension   . Osteoarthritis   . MGUS (monoclonal gammopathy of unknown significance)   . GERD (gastroesophageal reflux disease)   . Anemia   . Multiple myeloma ([place]) 02/2013    normal cytogenetics and FISH panel on 03/11/2013.   . Cataract   . Syncope   . Right bundle branch block   . Bundle branch block, right     Past Surgical History  Procedure Laterality Date  . Eye surgery    . Ep implantable device N/A 08/09/2015    Procedure: Pacemaker Implant;  Surgeon: [name], MD;  Location: [facility];  Service: Cardiovascular;  Laterality: N/A;    Social History  Substance Use Topics  . Smoking status: Former Smoker -- 2.00 packs/day for 35 years    Types: Cigarettes    Start date: 03/26/1963    Quit date: 12/18/1997  . Smokeless tobacco: Never Used     Comment: quit 15 years  . Alcohol Use: No    Family History  Problem Relation Age of Onset  . Hypertension Mother   .  Cancer Mother   . Stroke Mother   . Hypertension Sister   . Hypertension Brother   . Hypertension Maternal Grandmother     Allergies  Allergen Reactions  . Amlodipine Swelling  . Crestor [Rosuvastatin] Other (See Comments)    Per pt unable to focus    Medication list has been reviewed and updated.  Current Outpatient Prescriptions on File Prior to Visit  Medication Sig Dispense Refill  . carvedilol (COREG) 12.5 MG tablet Take 1 tablet (12.5 mg total) by mouth 2 (two) times daily with a meal. 60 tablet 3  . diphenhydramine-acetaminophen (TYLENOL PM) 25-500 MG TABS Take 1 tablet by mouth at bedtime as needed (sleep).    . ferrous sulfate 325 (65 FE) MG tablet Take 325 mg by mouth every other day.    Marland Kitchen glipiZIDE (GLUCOTROL XL) 5 MG 24  hr tablet Take 5 mg by mouth 2 (two) times daily.  0  . Glucosamine-Chondroitin-MSM (TRIPLE FLEX PO) Take 1 tablet by mouth daily.     Marland Kitchen HYDROcodone-acetaminophen (NORCO/VICODIN) 5-325 MG per tablet Take 1 tablet by mouth every 6 (six) hours as needed for moderate pain. 20 tablet 0  . Insulin Pen Needle 31G X 5 MM MISC 1 Units by Does not apply route daily. 100 each 5  . Lancets MISC Use as instructed twice a day 100 each 2  . Liraglutide 18 MG/3ML SOPN Inject 0.6 mg daily for one week then increase to 1.2 mg daily for one week finally increased to 1.8 following that  second week 3 pen 5  . losartan (COZAAR) 50 MG tablet Start with a 1/2 tablet daily and go to a whole tablet as needed (Patient taking differently: Take 25 mg by mouth daily. ) 90 tablet 3  . metFORMIN (GLUCOPHAGE) 1000 MG tablet TAKE 1 TABLET BY MOUTH TWICE A DAY WITH MEALS. (Patient taking differently: Take 1,000 mg by mouth 2 (two) times daily with a meal. ) 180 tablet 3  . Multiple Vitamin (MULTIVITAMIN WITH MINERALS) TABS Take 1 tablet by mouth daily.     . Omega-3 Fatty Acids (FISH OIL PO) Take 1 capsule by mouth every other day.    . ONE TOUCH ULTRA TEST test strip USE AS INSTRUCTED TWICE A DAY 100 each 11   No current facility-administered medications on file prior to visit.    Review of Systems:  As per HPI- otherwise negative.    Physical Examination: Filed Vitals:   12/08/15 0813  BP: 138/94  Pulse: 71  Temp: 97.8 F (36.6 C)  Resp: 16   Filed Vitals:   12/08/15 0813  Height: 6' 1.5" (1.867 m)  Weight: 218 lb 3.2 oz (98.975 kg)   Body mass index is 28.39 kg/(m^2). Ideal Body Weight: Weight in (lb) to have BMI = 25: 191.7  GEN: WDWN, NAD, Non-toxic, A & O x 3, mild overweight, looks well HEENT: Atraumatic, Normocephalic. Neck supple. No masses, No LAD.  Bilateral TM wnl, oropharynx normal.  PEERL,EOMI.   Ears and Nose: No external deformity. CV: RRR, No M/G/R. No JVD. No thrill. No extra heart sounds.   Pacemaker left chest  PULM: CTA B, no wheezes, crackles, rhonchi. No retractions. No resp. distress. No accessory muscle use. EXTR: No c/c/e NEURO Normal gait.  PSYCH: Normally interactive. Conversant. Not depressed or anxious appearing.  Calm demeanor.   Assessment and Plan: Uncontrolled type 2 diabetes mellitus without complication, without long-term current use of insulin ([place]) - Plan: Hemoglobin U2G, Basic metabolic panel, HM  Diabetes Foot Exam  Screening examination for infectious disease - Plan: Hepatitis C antibody  Cardiac pacemaker in situ   Here today to recheck his DM. Await A1c. He has been though a lot over the last several months with his pacemaker but is now doing well and feeling much better.  Pending A1c results may need to modify his medications His pulse rate is good, BP is acceptable He is seeing cardiology in a few days I believe  Signed Lamar Blinks, MD

## 2015-12-08 NOTE — Patient Instructions (Signed)
It was good to see you today!  I will be in touch with your labs asap- if your A1c is still over 8% or so we will adjust your medication in some way.    We can plan our follow-up depending on your A1c results

## 2015-12-08 NOTE — Assessment & Plan Note (Signed)
Since his device was placed, he has had no recurrent syncope. Will follow.

## 2015-12-08 NOTE — Assessment & Plan Note (Signed)
His blood pressure is elevated today but at home it is controlled. He is encouraged to maintain a low sodium diet.

## 2015-12-08 NOTE — Progress Notes (Signed)
    HPI [name] returns today for followup. He is a pleasant 69 yo man with symptomatic bradycardia, s/p PPM insertion. In the interim, he has gone back to exercising. He denies chest pain or sob. He notes some incisional pain. No other complaints.  Allergies  Allergen Reactions  . Amlodipine Swelling  . Crestor [Rosuvastatin] Other (See Comments)    Per pt unable to focus     Current Outpatient Prescriptions  Medication Sig Dispense Refill  . carvedilol (COREG) 12.5 MG tablet Take 1 tablet (12.5 mg total) by mouth 2 (two) times daily with a meal. 60 tablet 3  . diphenhydramine-acetaminophen (TYLENOL PM) 25-500 MG TABS Take 1 tablet by mouth at bedtime as needed (sleep). Reported on 12/08/2015    . ferrous sulfate 325 (65 FE) MG tablet Take 325 mg by mouth every other day.    . glipiZIDE (GLUCOTROL XL) 5 MG 24 hr tablet Take 5 mg by mouth 2 (two) times daily.  0  . Glucosamine-Chondroitin-MSM (TRIPLE FLEX PO) Take 1 tablet by mouth daily.     . Insulin Pen Needle 31G X 5 MM MISC 1 Units by Does not apply route daily. 100 each 5  . Lancets MISC Use as instructed twice a day 100 each 2  . losartan (COZAAR) 50 MG tablet Start with a 1/2 tablet daily and go to a whole tablet as needed (Patient taking differently: Take 25 mg by mouth daily. ) 90 tablet 3  . metFORMIN (GLUCOPHAGE) 1000 MG tablet TAKE 1 TABLET BY MOUTH TWICE A DAY WITH MEALS. (Patient taking differently: Take 1,000 mg by mouth 2 (two) times daily with a meal. ) 180 tablet 3  . Multiple Vitamin (MULTIVITAMIN WITH MINERALS) TABS Take 1 tablet by mouth daily.     . Omega-3 Fatty Acids (FISH OIL PO) Take 1 capsule by mouth every other day.    . ONE TOUCH ULTRA TEST test strip USE AS INSTRUCTED TWICE A DAY 100 each 11   No current facility-administered medications for this visit.     Past Medical History  Diagnosis Date  . Diabetes mellitus   . Hyperlipidemia   . Hypertension   . Osteoarthritis   . MGUS (monoclonal  gammopathy of unknown significance)   . GERD (gastroesophageal reflux disease)   . Anemia   . Multiple myeloma ([facility]) 02/2013    normal cytogenetics and FISH panel on 03/11/2013.   . Cataract   . Syncope   . Right bundle branch block   . Bundle branch block, right     ROS:   All systems reviewed and negative except as noted in the HPI.   Past Surgical History  Procedure Laterality Date  . Eye surgery    . Ep implantable device N/A 08/09/2015    Procedure: Pacemaker Implant;  Surgeon: Gregg W Taylor, MD;  Location: [facility];  Service: Cardiovascular;  Laterality: N/A;     Family History  Problem Relation Age of Onset  . Hypertension Mother   . Cancer Mother   . Stroke Mother   . Hypertension Sister   . Hypertension Brother   . Hypertension Maternal Grandmother      Social History   Social History  . Marital Status: Married    Spouse Name: N/A  . Number of Children: N/A  . Years of Education: N/A   Occupational History  . Not on file.   Social History Main Topics  . Smoking status: Former Smoker -- 2.00   packs/day for 35 years    Types: Cigarettes    Start date: 03/26/1963    Quit date: 12/18/1997  . Smokeless tobacco: Never Used     Comment: quit 15 years  . Alcohol Use: No  . Drug Use: No  . Sexual Activity: Yes    Birth Control/ Protection: None   Other Topics Concern  . Not on file   Social History Narrative   Married   Building Maintenance        BP 152/106 mmHg  Pulse 109  Ht 6' 1.5" (1.867 m)  Wt 221 lb 12.8 oz (100.608 kg)  BMI 28.86 kg/m2  Physical Exam:  Well appearing NAD HEENT: Unremarkable Neck:  No JVD, no thyromegally Lymphatics:  No adenopathy Back:  No CVA tenderness Lungs:  Clear with no wheezes. Device appears to be well healed.  HEART:  Regular rate rhythm, no murmurs, no rubs, no clicks Abd:  soft, positive bowel sounds, no organomegally, no rebound, no guarding Ext:  2 plus pulses, no edema, no cyanosis, no  clubbing Skin:  No rashes no nodules Neuro:  CN II through XII intact, motor grossly intact  EKG - nsr with AV pacing  DEVICE  Normal device function.  See PaceArt for details.   Assess/Plan:

## 2015-12-09 DIAGNOSIS — M5136 Other intervertebral disc degeneration, lumbar region: Secondary | ICD-10-CM | POA: Diagnosis not present

## 2015-12-09 DIAGNOSIS — M9905 Segmental and somatic dysfunction of pelvic region: Secondary | ICD-10-CM | POA: Diagnosis not present

## 2015-12-09 DIAGNOSIS — M9903 Segmental and somatic dysfunction of lumbar region: Secondary | ICD-10-CM | POA: Diagnosis not present

## 2015-12-09 DIAGNOSIS — M955 Acquired deformity of pelvis: Secondary | ICD-10-CM | POA: Diagnosis not present

## 2015-12-09 LAB — HEPATITIS C ANTIBODY: HCV Ab: NEGATIVE

## 2015-12-10 ENCOUNTER — Other Ambulatory Visit: Payer: Self-pay | Admitting: Nurse Practitioner

## 2015-12-10 LAB — HM DIABETES EYE EXAM

## 2015-12-28 ENCOUNTER — Encounter: Payer: Self-pay | Admitting: Family Medicine

## 2016-02-17 ENCOUNTER — Ambulatory Visit (INDEPENDENT_AMBULATORY_CARE_PROVIDER_SITE_OTHER): Payer: Medicare Other | Admitting: Family Medicine

## 2016-02-17 VITALS — BP 130/82 | HR 72 | Temp 98.2°F | Resp 18 | Wt 222.4 lb

## 2016-02-17 DIAGNOSIS — M25551 Pain in right hip: Secondary | ICD-10-CM

## 2016-02-17 NOTE — Progress Notes (Signed)
Michael Mons Sr.  MRN: [id number] DOB: [birth year]  Subjective:  Pt presents to clinic with right buttocks pain that he only has when he sits in a car seat.  He has not back pain.  He has put a cushion in his car seat but that has made no difference.  He is ok when he sits in a hard chair and chair at home.  It does not matter which type of car seat he sits in and that does not seem to matter.  The pain starts quickly about 3-5 mins after he is sitting and when he gets up he has pain resolution in a few minutes.  He is fine when he is standing or walking and he has no pain.  He has no paresthesias.  He has tried no medications, he does not take many OTC meds because of his other medical conditions.  Saw Dr Michael Wiggins last week.  Patient Active Problem List   Diagnosis Date Noted  . Pacemaker 12/08/2015  . Mobitz type 2 second degree heart block 08/08/2015  . Right bundle branch block 12/16/2014  . Syncope 12/16/2014  . Proteinuria due to type 2 diabetes mellitus ([place]) 11/12/2014  . Encounter for therapeutic drug monitoring 03/13/2013  . Multiple myeloma ([place]) 02/15/2013  . Edema 01/07/2013  . Diabetes mellitus due to underlying condition with other diabetic kidney complication ([place])   . Hyperlipidemia   . Hypertension   . MGUS (monoclonal gammopathy of unknown significance)   . GERD (gastroesophageal reflux disease)     Current Outpatient Prescriptions on File Prior to Visit  Medication Sig Dispense Refill  . carvedilol (COREG) 12.5 MG tablet TAKE 1 TABLET BY MOUTH TWICE A DAY WITH MEALS 60 tablet 3  . diphenhydramine-acetaminophen (TYLENOL PM) 25-500 MG TABS Take 1 tablet by mouth at bedtime as needed (sleep). Reported on 12/08/2015    . ferrous sulfate 325 (65 FE) MG tablet Take 325 mg by mouth every other day.    Marland Kitchen glipiZIDE (GLUCOTROL XL) 5 MG 24 hr tablet Take 5 mg by mouth 2 (two) times daily.  0  . Glucosamine-Chondroitin-MSM (TRIPLE FLEX PO) Take 1 tablet by mouth daily.     .  Insulin Pen Needle 31G X 5 MM MISC 1 Units by Does not apply route daily. 100 each 5  . Lancets MISC Use as instructed twice a day 100 each 2  . losartan (COZAAR) 50 MG tablet Start with a 1/2 tablet daily and go to a whole tablet as needed (Patient taking differently: Take 25 mg by mouth daily. ) 90 tablet 3  . metFORMIN (GLUCOPHAGE) 1000 MG tablet TAKE 1 TABLET BY MOUTH TWICE A DAY WITH MEALS. (Patient taking differently: Take 1,000 mg by mouth 2 (two) times daily with a meal. ) 180 tablet 3  . Multiple Vitamin (MULTIVITAMIN WITH MINERALS) TABS Take 1 tablet by mouth daily.     . Omega-3 Fatty Acids (FISH OIL PO) Take 1 capsule by mouth every other day.    . ONE TOUCH ULTRA TEST test strip USE AS INSTRUCTED TWICE A DAY 100 each 11   No current facility-administered medications on file prior to visit.    Allergies  Allergen Reactions  . Amlodipine Swelling  . Crestor [Rosuvastatin] Other (See Comments)    Per pt unable to focus    Review of Systems  Constitutional: Negative for fever and chills.  Musculoskeletal: Negative for back pain and gait problem.   Objective:  BP 130/82 mmHg  Pulse  72  Temp(Src) 98.2 F (36.8 C) (Oral)  Resp 18  Wt 222 lb 6.4 oz (100.88 kg)  SpO2 98%  Physical Exam  Constitutional: He is oriented to person, place, and time and well-developed, well-nourished, and in no distress.  HENT:  Head: Normocephalic and atraumatic.  Right Ear: External ear normal.  Left Ear: External ear normal.  Eyes: Conjunctivae are normal.  Neck: Normal range of motion.  Pulmonary/Chest: Effort normal.  Musculoskeletal:       Right hip: He exhibits tenderness.       Left hip: Normal.       Lumbar back: Normal.       Legs: Neurological: He is alert and oriented to person, place, and time. He has normal sensation, normal strength and normal reflexes. He displays no weakness. No sensory deficit. He has a normal Straight Leg Raise Test. Gait normal. Gait normal.  Skin: Skin  is warm and dry.  Psychiatric: Mood, memory, affect and judgment normal.    Assessment and Plan :  Hip pain, right - Plan: Ambulatory referral to Physical Therapy, Care order/instruction   Seems to be a sciatic nerve irritation but could also be spinal stenosis.  Due to complication medical history we will not do medications though the patient can use Tylenol prn.  We will start PT to increase ROM and decrease muscle tightness that occurs around the sciatic nerve.  If he is not getting better he may need imaging at that time.  D/w Dr Michael Forehand PA-C  [facility] 02/17/2016 4:55 PM

## 2016-02-17 NOTE — Patient Instructions (Signed)
Roll up a towel to go behind the low back.  Start PT

## 2016-02-20 NOTE — Progress Notes (Signed)
Patient discussed with Ms. Weber. Agree with assessment and plan of care per her note.   

## 2016-03-01 DIAGNOSIS — M5441 Lumbago with sciatica, right side: Secondary | ICD-10-CM | POA: Diagnosis not present

## 2016-03-01 DIAGNOSIS — M25551 Pain in right hip: Secondary | ICD-10-CM | POA: Diagnosis not present

## 2016-03-07 DIAGNOSIS — M5441 Lumbago with sciatica, right side: Secondary | ICD-10-CM | POA: Diagnosis not present

## 2016-03-07 DIAGNOSIS — M25551 Pain in right hip: Secondary | ICD-10-CM | POA: Diagnosis not present

## 2016-03-08 ENCOUNTER — Ambulatory Visit (INDEPENDENT_AMBULATORY_CARE_PROVIDER_SITE_OTHER): Payer: Medicare Other | Admitting: *Deleted

## 2016-03-08 DIAGNOSIS — I441 Atrioventricular block, second degree: Secondary | ICD-10-CM

## 2016-03-08 NOTE — Progress Notes (Signed)
Remote pacemaker transmission.   

## 2016-03-09 DIAGNOSIS — M25551 Pain in right hip: Secondary | ICD-10-CM | POA: Diagnosis not present

## 2016-03-09 DIAGNOSIS — M5441 Lumbago with sciatica, right side: Secondary | ICD-10-CM | POA: Diagnosis not present

## 2016-03-14 DIAGNOSIS — M25551 Pain in right hip: Secondary | ICD-10-CM | POA: Diagnosis not present

## 2016-03-14 DIAGNOSIS — M5441 Lumbago with sciatica, right side: Secondary | ICD-10-CM | POA: Diagnosis not present

## 2016-03-15 ENCOUNTER — Other Ambulatory Visit: Payer: Medicare Other

## 2016-03-15 ENCOUNTER — Ambulatory Visit: Payer: Medicare Other | Admitting: Hematology & Oncology

## 2016-03-22 ENCOUNTER — Other Ambulatory Visit: Payer: Medicare Other

## 2016-03-22 ENCOUNTER — Ambulatory Visit: Payer: Medicare Other | Admitting: Hematology & Oncology

## 2016-03-24 ENCOUNTER — Other Ambulatory Visit: Payer: Medicare Other

## 2016-03-24 ENCOUNTER — Ambulatory Visit: Payer: Medicare Other | Admitting: Hematology & Oncology

## 2016-03-28 DIAGNOSIS — M5441 Lumbago with sciatica, right side: Secondary | ICD-10-CM | POA: Diagnosis not present

## 2016-03-28 DIAGNOSIS — M25551 Pain in right hip: Secondary | ICD-10-CM | POA: Diagnosis not present

## 2016-03-30 ENCOUNTER — Other Ambulatory Visit: Payer: Self-pay | Admitting: Nurse Practitioner

## 2016-03-30 DIAGNOSIS — C9000 Multiple myeloma not having achieved remission: Secondary | ICD-10-CM

## 2016-03-31 ENCOUNTER — Other Ambulatory Visit (HOSPITAL_BASED_OUTPATIENT_CLINIC_OR_DEPARTMENT_OTHER): Payer: Medicare Other

## 2016-03-31 ENCOUNTER — Encounter: Payer: Self-pay | Admitting: Hematology & Oncology

## 2016-03-31 ENCOUNTER — Ambulatory Visit (HOSPITAL_BASED_OUTPATIENT_CLINIC_OR_DEPARTMENT_OTHER): Payer: Medicare Other | Admitting: Hematology & Oncology

## 2016-03-31 VITALS — BP 188/100 | HR 66 | Temp 97.3°F | Resp 16 | Ht 73.0 in | Wt 219.0 lb

## 2016-03-31 DIAGNOSIS — D509 Iron deficiency anemia, unspecified: Secondary | ICD-10-CM | POA: Diagnosis not present

## 2016-03-31 DIAGNOSIS — C9000 Multiple myeloma not having achieved remission: Secondary | ICD-10-CM

## 2016-03-31 DIAGNOSIS — Z950 Presence of cardiac pacemaker: Secondary | ICD-10-CM | POA: Diagnosis not present

## 2016-03-31 DIAGNOSIS — C9001 Multiple myeloma in remission: Secondary | ICD-10-CM

## 2016-03-31 LAB — CBC WITH DIFFERENTIAL (CANCER CENTER ONLY)
BASO#: 0.1 10*3/uL (ref 0.0–0.2)
BASO%: 0.7 % (ref 0.0–2.0)
EOS%: 4.6 % (ref 0.0–7.0)
Eosinophils Absolute: 0.4 10*3/uL (ref 0.0–0.5)
HEMATOCRIT: 37.9 % — AB (ref 38.7–49.9)
HEMOGLOBIN: 12.9 g/dL — AB (ref 13.0–17.1)
LYMPH#: 2.8 10*3/uL (ref 0.9–3.3)
LYMPH%: 34.6 % (ref 14.0–48.0)
MCH: 29.3 pg (ref 28.0–33.4)
MCHC: 34 g/dL (ref 32.0–35.9)
MCV: 86 fL (ref 82–98)
MONO#: 0.8 10*3/uL (ref 0.1–0.9)
MONO%: 9.5 % (ref 0.0–13.0)
NEUT#: 4.1 10*3/uL (ref 1.5–6.5)
NEUT%: 50.6 % (ref 40.0–80.0)
Platelets: 266 10*3/uL (ref 145–400)
RBC: 4.41 10*6/uL (ref 4.20–5.70)
RDW: 13.9 % (ref 11.1–15.7)
WBC: 8 10*3/uL (ref 4.0–10.0)

## 2016-03-31 LAB — COMPREHENSIVE METABOLIC PANEL
ALBUMIN: 3.5 g/dL (ref 3.5–5.0)
ALK PHOS: 49 U/L (ref 40–150)
ALT: 17 U/L (ref 0–55)
ANION GAP: 9 meq/L (ref 3–11)
AST: 18 U/L (ref 5–34)
BILIRUBIN TOTAL: 0.3 mg/dL (ref 0.20–1.20)
BUN: 16.1 mg/dL (ref 7.0–26.0)
CO2: 25 meq/L (ref 22–29)
CREATININE: 1 mg/dL (ref 0.7–1.3)
Calcium: 9.9 mg/dL (ref 8.4–10.4)
Chloride: 102 mEq/L (ref 98–109)
EGFR: >90 mL/min/{1.73_m2} (ref >90)
Glucose: 130 mg/dl (ref 70–140)
Potassium: 4 mEq/L (ref 3.5–5.1)
Sodium: 136 mEq/L (ref 136–145)
TOTAL PROTEIN: 7.7 g/dL (ref 6.4–8.3)

## 2016-03-31 LAB — LACTATE DEHYDROGENASE: LDH: 172 U/L (ref 125–245)

## 2016-03-31 NOTE — Progress Notes (Signed)
Hematology and Oncology Follow Up Visit  Michael Corro Sr. [id number] 1946/04/06 70 y.o. 03/31/2016   Principle Diagnosis:   IgG kappa smoldering myeloma  Iron deficiency anemia  Current Therapy:    Observation     Interim History:  [name] is back for followup. He is doing pretty well. He has a pacemaker. He may have to have this readjusted.  His back are still a bit today. 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 Sometimes this happens to him. I told him that he really has to be careful and "pace himself".  We last saw him back in September 2016, his M spike was 0.6 g/dL. His IgG level was 1690 mg/dL. His Kappa Lightchain was 3.38 mg/dL. All this is holding steady.  He's had a problem with infections.  He's had no bleeding.  He's had no change in bowel or bladder habits.   Overall, his performance status is ECOG 1. Medications:  Current outpatient prescriptions:  .  carvedilol (COREG) 12.5 MG tablet, TAKE 1 TABLET BY MOUTH TWICE A DAY WITH MEALS, Disp: 60 tablet, Rfl: 3 .  diphenhydramine-acetaminophen (TYLENOL PM) 25-500 MG TABS, Take 1 tablet by mouth at bedtime as needed (sleep). Reported on 12/08/2015, Disp: , Rfl:  .  ferrous sulfate 325 (65 FE) MG tablet, Take 325 mg by mouth every other day., Disp: , Rfl:  .  glipiZIDE (GLUCOTROL XL) 5 MG 24 hr tablet, Take 5 mg by mouth 2 (two) times daily., Disp: , Rfl: 0 .  Glucosamine-Chondroitin-MSM (TRIPLE FLEX PO), Take 1 tablet by mouth daily. , Disp: , Rfl:  .  Insulin Pen Needle 31G X 5 MM MISC, 1 Units by Does not apply route daily., Disp: 100 each, Rfl: 5 .  Lancets MISC, Use as instructed twice a day, Disp: 100 each, Rfl: 2 .  losartan (COZAAR) 50 MG tablet, Start with a 1/2 tablet daily and go to a whole tablet as needed (Patient taking differently: Take 25 mg by mouth daily. ), Disp: 90 tablet, Rfl: 3 .  metFORMIN (GLUCOPHAGE) 1000 MG tablet, TAKE 1 TABLET BY MOUTH TWICE A DAY WITH MEALS. (Patient taking differently:  Take 1,000 mg by mouth 2 (two) times daily with a meal. ), Disp: 180 tablet, Rfl: 3 .  Multiple Vitamin (MULTIVITAMIN WITH MINERALS) TABS, Take 1 tablet by mouth daily. , Disp: , Rfl:  .  Omega-3 Fatty Acids (FISH OIL PO), Take 1 capsule by mouth every other day., Disp: , Rfl:  .  ONE TOUCH ULTRA TEST test strip, USE AS INSTRUCTED TWICE A DAY, Disp: 100 each, Rfl: 11  Allergies:  Allergies  Allergen Reactions  . Amlodipine Swelling  . Crestor [Rosuvastatin] Other (See Comments)    Per pt unable to focus    Past Medical History, Surgical history, Social history, and Family History were reviewed and updated.  Review of Systems: As above  Physical Exam:  height is 6\' 1"  (1.854 m) and weight is 219 lb (99.338 kg). His oral temperature is 97.3 F (36.3 C). His blood pressure is 188/100 and his pulse is 66. His respiration is 16.   Well-developed well-nourished Afro-American gentleman. Lungs are clear. Cardiac exam shows a regular rate and rhythm with no murmurs rubs or bruits. Head and neck exam shows no sclera icterus. He has no adenopathy in the neck. Abdomen is soft. Has good bowel sounds. There is no fluid wave. There is no palpable liver or spleen tip. Back exam shows no tenderness over  the spine, ribs or hips. Extremities shows no clubbing cyanosis or edema. He has good range of motion of his joints. He has good strength in his extremities. Neurological exam shows no focal neurological deficits. Skin exam no rashes.  Lab Results  Component Value Date   WBC 8.0 03/31/2016   HGB 12.9* 03/31/2016   HCT 37.9* 03/31/2016   MCV 86 03/31/2016   PLT 266 03/31/2016     Chemistry      Component Value Date/Time   NA 131* 12/08/2015 0832   NA 135* 09/16/2015 1207   NA 138 03/09/2015 1111   K 4.3 12/08/2015 0832   K 4.3 09/16/2015 1207   K 4.3 03/09/2015 1111   CL 96* 12/08/2015 0832   CL 98 03/09/2015 1111   CL 108* 05/15/2013 0912   CO2 26 12/08/2015 0832   CO2 27 09/16/2015 1207    CO2 30 03/09/2015 1111   BUN 14 12/08/2015 0832   BUN 16.2 09/16/2015 1207   BUN 10 03/09/2015 1111   CREATININE 0.93 12/08/2015 0832   CREATININE 1.1 09/16/2015 1207   CREATININE 0.87 08/09/2015 0713      Component Value Date/Time   CALCIUM 9.6 12/08/2015 0832   CALCIUM 9.7 09/16/2015 1207   CALCIUM 9.6 03/09/2015 1111   ALKPHOS 51 09/16/2015 1207   ALKPHOS 48 08/08/2015 1707   ALKPHOS 54 03/09/2015 1111   AST 18 09/16/2015 1207   AST 32 08/08/2015 1707   AST 18 03/09/2015 1111   ALT 20 09/16/2015 1207   ALT 26 08/08/2015 1707   ALT 18 03/09/2015 1111   BILITOT 0.34 09/16/2015 1207   BILITOT 0.6 08/08/2015 1707   BILITOT 0.60 03/09/2015 1111         Impression and Plan: [name] is 70 year old gentleman with IgG kappa smoldering myeloma. So far, I do not see any evidence of progressive disease. His myeloma numbers are slowly trending upward. His total protein level was on the higher side this time.We will have to watch this.  For now, we will plan to get him back in another 6 months.  We will see what his myeloma studies show.  I think it is a very good idea that he has the pacemaker. I think this is really helping him.Volanda Napoleon, MD 4/14/20172:29 PM

## 2016-04-01 LAB — IGG, IGA, IGM
IGA/IMMUNOGLOBULIN A, SERUM: 378 mg/dL (ref 61–437)
IgM, Qn, Serum: 26 mg/dL (ref 20–172)

## 2016-04-03 LAB — KAPPA/LAMBDA LIGHT CHAINS
IG KAPPA FREE LIGHT CHAIN: 37.93 mg/L — AB (ref 3.30–19.40)
IG LAMBDA FREE LIGHT CHAIN: 17.44 mg/L (ref 5.71–26.30)
KAPPA/LAMBDA FLC RATIO: 2.17 — AB (ref 0.26–1.65)

## 2016-04-04 LAB — PROTEIN ELECTROPHORESIS, SERUM, WITH REFLEX
A/G Ratio: 0.9 (ref 0.7–1.7)
ALBUMIN: 3.3 g/dL (ref 2.9–4.4)
ALPHA 2: 0.9 g/dL (ref 0.4–1.0)
Alpha 1: 0.2 g/dL (ref 0.0–0.4)
BETA: 1.1 g/dL (ref 0.7–1.3)
GAMMA GLOBULIN: 1.5 g/dL (ref 0.4–1.8)
Globulin, Total: 3.7 g/dL (ref 2.2–3.9)
Interpretation(See Below): 0
M-Spike, %: 0.9 g/dL — ABNORMAL HIGH
TOTAL PROTEIN: 7 g/dL (ref 6.0–8.5)

## 2016-04-07 ENCOUNTER — Encounter: Payer: Self-pay | Admitting: Cardiology

## 2016-04-07 LAB — CUP PACEART REMOTE DEVICE CHECK
Battery Remaining Longevity: 128 mo
Battery Remaining Percentage: 95.5 %
Brady Statistic AP VS Percent: 1.7 %
Brady Statistic AS VS Percent: 85 %
Brady Statistic RV Percent Paced: 12 %
Implantable Lead Implant Date: 20160822
Implantable Lead Location: 753860
Lead Channel Impedance Value: 530 Ohm
Lead Channel Pacing Threshold Amplitude: 1.125 V
Lead Channel Pacing Threshold Pulse Width: 0.4 ms
Lead Channel Sensing Intrinsic Amplitude: 2.1 mV
Lead Channel Setting Pacing Pulse Width: 0.4 ms
Lead Channel Setting Sensing Sensitivity: 2 mV
MDC IDC LEAD IMPLANT DT: 20160822
MDC IDC LEAD LOCATION: 753859
MDC IDC MSMT BATTERY VOLTAGE: 3.01 V
MDC IDC MSMT LEADCHNL RA PACING THRESHOLD AMPLITUDE: 1.25 V
MDC IDC MSMT LEADCHNL RA PACING THRESHOLD PULSEWIDTH: 0.4 ms
MDC IDC MSMT LEADCHNL RV IMPEDANCE VALUE: 590 Ohm
MDC IDC MSMT LEADCHNL RV SENSING INTR AMPL: 12 mV
MDC IDC SESS DTM: 20170322060016
MDC IDC SET LEADCHNL RA PACING AMPLITUDE: 2.25 V
MDC IDC SET LEADCHNL RV PACING AMPLITUDE: 1.375
MDC IDC STAT BRADY AP VP PERCENT: 4.1 %
MDC IDC STAT BRADY AS VP PERCENT: 7.5 %
MDC IDC STAT BRADY RA PERCENT PACED: 4.5 %
Pulse Gen Model: 2240
Pulse Gen Serial Number: 7799340

## 2016-04-13 ENCOUNTER — Ambulatory Visit: Payer: Medicare Other | Admitting: Family Medicine

## 2016-04-13 ENCOUNTER — Other Ambulatory Visit: Payer: Self-pay | Admitting: Internal Medicine

## 2016-04-17 ENCOUNTER — Encounter: Payer: Self-pay | Admitting: Family Medicine

## 2016-04-17 ENCOUNTER — Ambulatory Visit (INDEPENDENT_AMBULATORY_CARE_PROVIDER_SITE_OTHER): Payer: Medicare Other | Admitting: Family Medicine

## 2016-04-17 ENCOUNTER — Other Ambulatory Visit: Payer: Self-pay

## 2016-04-17 VITALS — BP 170/100 | HR 60 | Temp 97.6°F | Ht 75.0 in | Wt 217.6 lb

## 2016-04-17 DIAGNOSIS — Z794 Long term (current) use of insulin: Secondary | ICD-10-CM

## 2016-04-17 DIAGNOSIS — I10 Essential (primary) hypertension: Secondary | ICD-10-CM

## 2016-04-17 DIAGNOSIS — E1165 Type 2 diabetes mellitus with hyperglycemia: Secondary | ICD-10-CM

## 2016-04-17 DIAGNOSIS — IMO0001 Reserved for inherently not codable concepts without codable children: Secondary | ICD-10-CM

## 2016-04-17 LAB — HEMOGLOBIN A1C: HEMOGLOBIN A1C: 10 % — AB (ref 4.6–6.5)

## 2016-04-17 MED ORDER — BASAGLAR KWIKPEN 100 UNIT/ML ~~LOC~~ SOPN: PEN_INJECTOR | SUBCUTANEOUS | Status: DC

## 2016-04-17 MED ORDER — LOSARTAN POTASSIUM 50 MG PO TABS: ORAL_TABLET | ORAL | Status: DC

## 2016-04-17 NOTE — Progress Notes (Signed)
[facility] at [facility] [address] [place], [place] [address] [phone]  Date:  04/17/2016   Name:  Michael Kainz Sr.   DOB:  [birth year]   MRN:  [id number]  PCP:  Lamar Blinks, MD    Chief Complaint: Follow-up   History of Present Illness:  Michael J[place]on[place] Sr. is a 70 y.o. very pleasant male patient who presents with the following:  History of heart block/ pacemaker, MM, DM, hyperlipidemia, HTN MM managed by Dr. Marin Olp- in remission Cardiologist is Dr. Lovena Le- got the pacemaker last August.  He is feeling well in this respect. Is very active, no CP  Here today for a DM check. He notes that his glucose control is getting worse.  Lab Results  Component Value Date   HGBA1C 9.6* 12/08/2015   He notes that his FBG had been 140 -180 in the am and in the 70s-80s in the afternoon.  However it is now running 200- 240 in the am,120- 140 in the afternoon.   He has not changes his diet or activity level.    He had not wanted to start on insulin due to expense in the past.  However he knows this may be necessary and is ok with the idea.  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  He has noted his pressure running high in the am prior to meds, then to 140/ 80- 90 once he has taken his meds.  He takes carvedilol BID and cozaar 25 once or twice a day   Patient Active Problem List   Diagnosis Date Noted  . Pacemaker 12/08/2015  . Mobitz type 2 second degree heart block 08/08/2015  . Right bundle branch block 12/16/2014  . Syncope 12/16/2014  . Proteinuria due to type 2 diabetes mellitus ([place]) 11/12/2014  . Encounter for therapeutic drug monitoring 03/13/2013  . Multiple myeloma ([place]) 02/15/2013  . Edema 01/07/2013  . Diabetes mellitus due to underlying condition with other diabetic kidney complication ([place])   .  Hyperlipidemia   . Hypertension   . MGUS (monoclonal gammopathy of unknown significance)   . GERD (gastroesophageal reflux disease)     Past Medical History  Diagnosis Date  . Diabetes mellitus   . Hyperlipidemia   . Hypertension   . Osteoarthritis   . MGUS (monoclonal gammopathy of unknown significance)   . GERD (gastroesophageal reflux disease)   . Anemia   . Multiple myeloma ([place]) 02/2013    normal cytogenetics and FISH panel on 03/11/2013.   . Cataract   . Syncope   . Right bundle branch block   . Bundle branch block, right     Past Surgical History  Procedure Laterality Date  . Eye surgery    . Ep implantable device N/A 08/09/2015    Procedure: Pacemaker Implant;  Surgeon: [name], MD;  Location: [facility];  Service: Cardiovascular;  Laterality: N/A;    Social History  Substance Use Topics  . Smoking status: Former Smoker -- 2.00 packs/day for 35 years    Types: Cigarettes    Start date: 03/26/1963    Quit date: 12/18/1997  . Smokeless tobacco: Never Used     Comment: quit 15 years  . Alcohol Use: No    Family History  Problem Relation Age of Onset  . Hypertension Mother   .  Cancer Mother   . Stroke Mother   . Hypertension Sister   . Hypertension Brother   . Hypertension Maternal Grandmother     Allergies  Allergen Reactions  . Amlodipine Swelling  . Crestor [Rosuvastatin] Other (See Comments)    Per pt unable to focus    Medication list has been reviewed and updated.  Current Outpatient Prescriptions on File Prior to Visit  Medication Sig Dispense Refill  . carvedilol (COREG) 12.5 MG tablet TAKE 1 TABLET BY MOUTH TWICE A DAY WITH MEALS 60 tablet 6  . diphenhydramine-acetaminophen (TYLENOL PM) 25-500 MG TABS Take 1 tablet by mouth at bedtime as needed (sleep). Reported on 12/08/2015    . ferrous sulfate 325 (65 FE) MG tablet Take 325 mg by mouth every other day.    Marland Kitchen glipiZIDE (GLUCOTROL XL) 5 MG 24 hr tablet Take 5 mg by mouth 2  (two) times daily.  0  . Glucosamine-Chondroitin-MSM (TRIPLE FLEX PO) Take 1 tablet by mouth daily.     . Lancets MISC Use as instructed twice a day 100 each 2  . losartan (COZAAR) 50 MG tablet Start with a 1/2 tablet daily and go to a whole tablet as needed (Patient taking differently: Take 25 mg by mouth daily. ) 90 tablet 3  . metFORMIN (GLUCOPHAGE) 1000 MG tablet TAKE 1 TABLET BY MOUTH TWICE A DAY WITH MEALS. (Patient taking differently: Take 1,000 mg by mouth 2 (two) times daily with a meal. ) 180 tablet 3  . Multiple Vitamin (MULTIVITAMIN WITH MINERALS) TABS Take 1 tablet by mouth daily.     . Omega-3 Fatty Acids (FISH OIL PO) Take 1 capsule by mouth every other day.    . ONE TOUCH ULTRA TEST test strip USE AS INSTRUCTED TWICE A DAY 100 each 11   No current facility-administered medications on file prior to visit.    Review of Systems:  As per HPI- otherwise negative.   Physical Examination: Filed Vitals:   04/17/16 1005  BP: 170/100  Pulse: 60  Temp: 97.6 F (36.4 C)   Filed Vitals:   04/17/16 1005  Height: '6\' 3"'$  (1.905 m)  Weight: 217 lb 9.6 oz (98.703 kg)   Body mass index is 27.2 kg/(m^2). Ideal Body Weight: Weight in (lb) to have BMI = 25: 199.6  GEN: WDWN, NAD, Non-toxic, A & O x 3, fit build, looks well HEENT: Atraumatic, Normocephalic. Neck supple. No masses, No LAD. Ears and Nose: No external deformity. CV: RRR, No M/G/R. No JVD. No thrill. No extra heart sounds. PULM: CTA B, no wheezes, crackles, rhonchi. No retractions. No resp. distress. No accessory muscle use. EXTR: No c/c/e NEURO Normal gait.  PSYCH: Normally interactive. Conversant. Not depressed or anxious appearing.  Calm demeanor.   Results for orders placed or performed in visit on 04/17/16  Hemoglobin A1c  Result Value Ref Range   Hgb A1c MFr Bld 10.0 (H) 4.6 - 6.5 %   Assessment and Plan: Uncontrolled type 2 diabetes mellitus without complication, with long-term current use of insulin (HCC)  - Plan: Insulin Glargine (BASAGLAR KWIKPEN) 100 UNIT/ML SOPN, losartan (COZAAR) 50 MG tablet, Hemoglobin A1c  Essential hypertension - Plan: losartan (COZAAR) 50 MG tablet worsening DM control- needs to start insulin assuming A1c still high Given sample of basaglar to use today, discussed safety check, dose adjustment and injection sites.  When A1c came in called pt- we will go ahead and start basaglar.  He will begin at 10 units and increase by 2  units every 2 days until BFG is under 150 Will have him take losartan 25 twice a day every day He did not take his BP medication yet today.  Plan to follow-up closely by phone  Meds ordered this encounter  Medications  . Insulin Glargine (BASAGLAR KWIKPEN) 100 UNIT/ML SOPN    Sig: Start with 10 units and adjust as directed    Dispense:  15 mL    Refill:  3  . losartan (COZAAR) 50 MG tablet    Sig: Take 1/2 tablet twice a day for hypertension    Dispense:  90 tablet    Refill:  3     Signed Lamar Blinks, MD

## 2016-04-17 NOTE — Progress Notes (Signed)
Pre visit review using our clinic tool,if applicable. No additional management support is needed unless otherwise documented below in the visit note.  

## 2016-04-17 NOTE — Patient Instructions (Addendum)
Start taking the losartan 25 mg twice a day every day- I hope that this will better control your blood pressure. Please keep me posted as far as your blood pressure readings- we can go up a bit on your losartan if needed  Your diabetes is getting worse- this is likely due to your pancreas making less insulin, and not to anything that you are doing wrong. You are taking great care of yourself as far as exercise and staying fit! Continue to exercise  I will call you with your A1c results- assuming this is still high we will start on insulin for you Start on the Basaglar (long acting insulin) once a day, 10 units per dose If you have any questions or are not sure about how to use this let me know! We will increase your insulin gradually as needed: Check your fasting sugar in the morning.  As long as your fasting sugar is over 150, increase your insulin dose by 2 units every 2 days.

## 2016-04-25 ENCOUNTER — Telehealth: Payer: Self-pay | Admitting: Family Medicine

## 2016-04-25 NOTE — Telephone Encounter (Signed)
-----   Message from Darreld Mclean, MD sent at 04/17/2016  9:43 PM EDT ----- Check on his DM control

## 2016-04-25 NOTE — Telephone Encounter (Signed)
Called and LMOM- please give me a call with an update regarding how he is doing with his insulin and glucose control

## 2016-04-27 ENCOUNTER — Encounter: Payer: Self-pay | Admitting: Family Medicine

## 2016-04-28 NOTE — Telephone Encounter (Signed)
Called and LMOM- please respond to [employer]

## 2016-05-10 ENCOUNTER — Telehealth: Payer: Self-pay | Admitting: Family Medicine

## 2016-05-10 DIAGNOSIS — E1165 Type 2 diabetes mellitus with hyperglycemia: Principal | ICD-10-CM

## 2016-05-10 DIAGNOSIS — Z794 Long term (current) use of insulin: Principal | ICD-10-CM

## 2016-05-10 DIAGNOSIS — IMO0001 Reserved for inherently not codable concepts without codable children: Secondary | ICD-10-CM

## 2016-05-10 NOTE — Telephone Encounter (Signed)
Can be reached: [phone] [facility]acy: [id number] [place], [place]  Reason for call: pt called stating the Insulin Glargine (BASAGLAR KWIKPEN) 100 UNIT/ML SOPN would be $345 month because it is not covered by his insurance. He said pharmacy told him he could not switch insulin b/c he already started this one. He said the website said the same thing. Pt is not sure what to do. Please call him back. [facility] told [facility] Lantus would be $170/month.

## 2016-05-11 MED ORDER — INSULIN GLARGINE 100 UNIT/ML SOLOSTAR PEN: PEN_INJECTOR | SUBCUTANEOUS | Status: DC

## 2016-05-11 NOTE — Telephone Encounter (Signed)
Called and discussed with pt.  He was not able to use the basaglar savings card because he has medicare.  Will change to lantus- explained that this is the same medication.  He has noted some sugars approx 110 with 10 units of glargine and got scared; advised he can decreased to 6-8 units if he likes He will see me in about 2 more months to recheck  Meds ordered this encounter  Medications  . Insulin Glargine (LANTUS SOLOSTAR) 100 UNIT/ML Solostar Pen    Sig: Inject 6- 10 units subque daily. May adjust as needed    Dispense:  5 pen    Refill:  3

## 2016-06-07 ENCOUNTER — Ambulatory Visit (INDEPENDENT_AMBULATORY_CARE_PROVIDER_SITE_OTHER): Payer: Medicare Other | Admitting: *Deleted

## 2016-06-07 DIAGNOSIS — I441 Atrioventricular block, second degree: Secondary | ICD-10-CM

## 2016-06-07 NOTE — Progress Notes (Signed)
Remote pacemaker transmission.   

## 2016-06-09 ENCOUNTER — Encounter: Payer: Self-pay | Admitting: Cardiology

## 2016-06-13 LAB — CUP PACEART REMOTE DEVICE CHECK
Battery Remaining Longevity: 125 mo
Battery Remaining Percentage: 95.5 %
Brady Statistic AP VP Percent: 4.7 %
Brady Statistic AP VS Percent: 1.7 %
Brady Statistic AS VS Percent: 85 %
Implantable Lead Implant Date: 20160822
Implantable Lead Location: 753859
Lead Channel Impedance Value: 490 Ohm
Lead Channel Pacing Threshold Amplitude: 1.375 V
Lead Channel Pacing Threshold Pulse Width: 0.4 ms
Lead Channel Sensing Intrinsic Amplitude: 2.3 mV
Lead Channel Setting Pacing Amplitude: 1.625
Lead Channel Setting Pacing Pulse Width: 0.4 ms
MDC IDC LEAD IMPLANT DT: 20160822
MDC IDC LEAD LOCATION: 753860
MDC IDC MSMT BATTERY VOLTAGE: 3.01 V
MDC IDC MSMT LEADCHNL RA PACING THRESHOLD AMPLITUDE: 1.25 V
MDC IDC MSMT LEADCHNL RA PACING THRESHOLD PULSEWIDTH: 0.4 ms
MDC IDC MSMT LEADCHNL RV IMPEDANCE VALUE: 560 Ohm
MDC IDC MSMT LEADCHNL RV SENSING INTR AMPL: 12 mV
MDC IDC PG SERIAL: 7799340
MDC IDC SESS DTM: 20170621080015
MDC IDC SET LEADCHNL RA PACING AMPLITUDE: 2.25 V
MDC IDC SET LEADCHNL RV SENSING SENSITIVITY: 2 mV
MDC IDC STAT BRADY AS VP PERCENT: 7.9 %
MDC IDC STAT BRADY RA PERCENT PACED: 5.1 %
MDC IDC STAT BRADY RV PERCENT PACED: 13 %

## 2016-06-23 ENCOUNTER — Encounter: Payer: Self-pay | Admitting: Cardiology

## 2016-07-13 ENCOUNTER — Telehealth: Payer: Self-pay

## 2016-07-13 NOTE — Telephone Encounter (Signed)
Relation to PO:[id number] Call back number:[phone] [id number] [place], [place] [phone] (Phone) [phone] (Fax)     Reason for call:  Patient calling checking on the status of message below, I checked with the pharmacy patient doesn't need a PA, patient is in need of a medication refill. Patient is completely out please Michael Wiggins

## 2016-07-13 NOTE — Telephone Encounter (Signed)
Patient called stated he needs a PA done for his metfomin and Pharmacy has already gave him a day supply but said they still have not received PA from out office.   [phone]  next dose this evening and patient complete

## 2016-07-14 MED ORDER — METFORMIN HCL 1000 MG PO TABS: ORAL_TABLET | ORAL | 3 refills | Status: DC

## 2016-07-14 NOTE — Telephone Encounter (Signed)
Noted.  Will refill med.  Last filled:  06/07/15 Amt:  180,3 Last OV: 04/17/16  Med filled.

## 2016-07-14 NOTE — Telephone Encounter (Signed)
Called patient and made him aware that rx was sent to the pharmacy.  While on phone he asked to schedule an appt.  Pt stated he recently started insulin, but would rather continue taking oral diabetic meds rather than start insulin and would like to follow up with [name] to see if oral meds, diet and exercise will help with his A1c.  Education was provided on why insulin was started.  He stated understanding, but states he would rather not take it at this time.     Message routed to PCP for FYI.

## 2016-08-09 ENCOUNTER — Telehealth: Payer: Self-pay

## 2016-08-09 NOTE — Telephone Encounter (Signed)
[facility] [address] called requesting a refill for [employer]. Glipizide 5 MG.

## 2016-08-10 NOTE — Telephone Encounter (Signed)
Notified pharm that pt is now seeing Dr Lorelei Pont at [facility] and gave them contact numbers.

## 2016-08-16 ENCOUNTER — Telehealth: Payer: Self-pay | Admitting: Family Medicine

## 2016-08-16 NOTE — Telephone Encounter (Signed)
°  Relationship to patient: Self  Can be reached: [phone]   Pharmacy:  [id number] [place], [place] [phone] (Phone) [phone] (Fax)     Reason for call: Request refill on glipiZIDE (GLUCOTROL XL) 5 MG 24 hr tablet [id number]  States he needs enough to last him until he comes in for follow up on 9/7

## 2016-08-17 ENCOUNTER — Telehealth: Payer: Self-pay | Admitting: Internal Medicine

## 2016-08-17 ENCOUNTER — Other Ambulatory Visit: Payer: Self-pay | Admitting: Emergency Medicine

## 2016-08-17 MED ORDER — GLIPIZIDE ER 5 MG PO TB24: 5.0000 mg | ORAL_TABLET | Freq: Two times a day (BID) | ORAL | 2 refills | Status: DC

## 2016-08-17 NOTE — Telephone Encounter (Signed)
Did not need encounter,need an appointment

## 2016-08-24 ENCOUNTER — Encounter: Payer: Self-pay | Admitting: Family Medicine

## 2016-08-24 ENCOUNTER — Ambulatory Visit (INDEPENDENT_AMBULATORY_CARE_PROVIDER_SITE_OTHER): Payer: Medicare Other | Admitting: Family Medicine

## 2016-08-24 VITALS — BP 186/110 | HR 63 | Temp 98.1°F | Ht 75.0 in | Wt 218.2 lb

## 2016-08-24 DIAGNOSIS — E785 Hyperlipidemia, unspecified: Secondary | ICD-10-CM | POA: Diagnosis not present

## 2016-08-24 DIAGNOSIS — E1165 Type 2 diabetes mellitus with hyperglycemia: Secondary | ICD-10-CM | POA: Diagnosis not present

## 2016-08-24 DIAGNOSIS — I10 Essential (primary) hypertension: Secondary | ICD-10-CM | POA: Diagnosis not present

## 2016-08-24 DIAGNOSIS — Z950 Presence of cardiac pacemaker: Secondary | ICD-10-CM | POA: Diagnosis not present

## 2016-08-24 DIAGNOSIS — Z23 Encounter for immunization: Secondary | ICD-10-CM

## 2016-08-24 DIAGNOSIS — IMO0001 Reserved for inherently not codable concepts without codable children: Secondary | ICD-10-CM

## 2016-08-24 LAB — BASIC METABOLIC PANEL
BUN: 16 mg/dL (ref 6–23)
CALCIUM: 9.2 mg/dL (ref 8.4–10.5)
CO2: 31 meq/L (ref 19–32)
CREATININE: 0.92 mg/dL (ref 0.40–1.50)
Chloride: 98 mEq/L (ref 96–112)
GFR: 104.35 mL/min (ref >60.00)
GLUCOSE: 214 mg/dL — AB (ref 70–99)
Potassium: 3.9 mEq/L (ref 3.5–5.1)
Sodium: 133 mEq/L — ABNORMAL LOW (ref 135–145)

## 2016-08-24 LAB — LIPID PANEL
CHOLESTEROL: 244 mg/dL — AB (ref 0–200)
HDL: 33.6 mg/dL — AB (ref >39.00)
LDL Cholesterol: 172 mg/dL — ABNORMAL HIGH (ref 0–99)
NONHDL: 210.36
TRIGLYCERIDES: 190 mg/dL — AB (ref 0.0–149.0)
Total CHOL/HDL Ratio: 7
VLDL: 38 mg/dL (ref 0.0–40.0)

## 2016-08-24 LAB — HEMOGLOBIN A1C: HEMOGLOBIN A1C: 9.1 % — AB (ref 4.6–6.5)

## 2016-08-24 MED ORDER — GLIPIZIDE ER 5 MG PO TB24: 5.0000 mg | ORAL_TABLET | Freq: Two times a day (BID) | ORAL | 2 refills | Status: DC

## 2016-08-24 NOTE — Progress Notes (Signed)
Pre visit review using our clinic review tool, if applicable. No additional management support is needed unless otherwise documented below in the visit note. 

## 2016-08-24 NOTE — Patient Instructions (Signed)
We will check your A1c today and go back on insulin if necessary.   Your blood pressure is too high- please increase your losartan to 50 mg twice daily You got your flu shot today I'll be in touch with labs asap Please see me in 3 months for a recheck

## 2016-08-24 NOTE — Progress Notes (Addendum)
[facility] at [facility] [address] [place], [place] [address] [phone]  Date:  08/24/2016   Name:  Michael Bonanno Sr.   DOB:  [birth year]   MRN:  [id number]  PCP:  Lamar Blinks, MD    Chief Complaint: Follow-up (Pt here for follow up. Pt states that after running out of sample of insulin he never picked up rx for insulin at the pharmacy. )   History of Present Illness:  Michael Redmon Sr. is a 70 y.o. very pleasant male patient who presents with the following:  Last seen by myself in May- partial HPI from that visit: History of heart block/ pacemaker, MM, DM, hyperlipidemia, HTN MM managed by Dr. Marin Olp- in remission Cardiologist is Dr. Lovena Le- got the pacemaker last August.  He is feeling well in this respect. Is very active, no CP  We started him on once daily insulin at our last visit as his A1c had gone up.    He reports that he is feeling well.  He did slip yesterday and pulled his back; he slipped on a wet spot on the floor. He did not fall but pulled his back muscles and is a bit sore today.    He is on glipizide and also metformin. He went to refill his glipizide about 2.5 months ago, and got a pill that looked very different than he was used to. He had been on glipizide '5mg'$  ER- he brings in samples of both pills to me, and they are both glipizide 5 ER but 2 different generic pills with different appearances.  He reports that his glucose seemed to get a lot lower really fast when his glipizide pill changed so he stopped using the insulin He has not taken any meds yet today- did not take his BP meds e  He reports average am BP of 157/ 80- 90 at home- this is after taking his meds He is on losartan 25 BID and also coreg 12.5  Dr. 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 No CP or SOB while he is working. He does have a pacemaker that was recently checked and he is seeing Dr. Lovena Le later on this month  He does use ensure once a day as a meal replacement - not low sugar He is FASTING today  BP Readings from Last 3 Encounters:  08/24/16 (!) 186/110  04/17/16 (!) 170/100  03/31/16 (!) 188/100     Lab Results  Component Value Date   HGBA1C 10.0 (H) 04/17/2016     Patient Active Problem List   Diagnosis Date Noted  . Pacemaker 12/08/2015  . Mobitz type 2 second degree heart block 08/08/2015  . Right bundle branch block 12/16/2014  . Syncope 12/16/2014  . Proteinuria due to type 2 diabetes mellitus ([place]) 11/12/2014  . Encounter for therapeutic drug monitoring 03/13/2013  . Multiple myeloma ([place]) 02/15/2013  . Edema 01/07/2013  . Diabetes mellitus due to underlying condition with other diabetic kidney complication ([place])   . Hyperlipidemia   . Hypertension   . MGUS (monoclonal gammopathy of unknown significance)   . GERD (gastroesophageal reflux disease)     Past Medical  History:  Diagnosis Date  . Anemia   . Bundle branch block, right   . Cataract   . Diabetes mellitus   . GERD (gastroesophageal reflux disease)   . Hyperlipidemia   . Hypertension   . MGUS (monoclonal gammopathy of unknown significance)   . Multiple myeloma ([place]) 02/2013   normal cytogenetics and FISH panel on 03/11/2013.   . Osteoarthritis   . Right bundle branch block   . Syncope     Past Surgical History:  Procedure Laterality Date  . EP IMPLANTABLE DEVICE N/A 08/09/2015   Procedure: Pacemaker Implant;  Surgeon: [name], MD;  Location: [facility];  Service: Cardiovascular;  Laterality: N/A;  . EYE SURGERY      Social History  Substance Use Topics  . Smoking status: Former Smoker    Packs/day: 2.00    Years: 35.00    Types: Cigarettes    Start date:  03/26/1963    Quit date: 12/18/1997  . Smokeless tobacco: Never Used     Comment: quit 15 years  . Alcohol use No    Family History  Problem Relation Age of Onset  . Hypertension Mother   . Cancer Mother   . Stroke Mother   . Hypertension Sister   . Hypertension Brother   . Hypertension Maternal Grandmother     Allergies  Allergen Reactions  . Amlodipine Swelling  . Crestor [Rosuvastatin] Other (See Comments)    Per pt unable to focus    Medication list has been reviewed and updated.  Current Outpatient Prescriptions on File Prior to Visit  Medication Sig Dispense Refill  . carvedilol (COREG) 12.5 MG tablet TAKE 1 TABLET BY MOUTH TWICE A DAY WITH MEALS 60 tablet 6  . diphenhydramine-acetaminophen (TYLENOL PM) 25-500 MG TABS Take 1 tablet by mouth at bedtime as needed (sleep). Reported on 12/08/2015    . ferrous sulfate 325 (65 FE) MG tablet Take 325 mg by mouth every other day.    Marland Kitchen glipiZIDE (GLUCOTROL XL) 5 MG 24 hr tablet Take 1 tablet (5 mg total) by mouth 2 (two) times daily. 60 tablet 2  . Glucosamine-Chondroitin-MSM (TRIPLE FLEX PO) Take 1 tablet by mouth daily.     Marland Kitchen losartan (COZAAR) 50 MG tablet Take 1/2 tablet twice a day for hypertension 90 tablet 3  . metFORMIN (GLUCOPHAGE) 1000 MG tablet TAKE 1 TABLET BY MOUTH TWICE A DAY WITH MEALS. 180 tablet 3  . Multiple Vitamin (MULTIVITAMIN WITH MINERALS) TABS Take 1 tablet by mouth daily.     . Omega-3 Fatty Acids (FISH OIL PO) Take 1 capsule by mouth every other day.    . ONE TOUCH ULTRA TEST test strip USE AS INSTRUCTED TWICE A DAY 100 each 11  . Insulin Glargine (LANTUS SOLOSTAR) 100 UNIT/ML Solostar Pen Inject 6- 10 units subque daily. May adjust as needed (Patient not taking: Reported on 08/24/2016) 5 pen 3  . Lancets MISC Use as instructed twice a day (Patient not taking: Reported on 08/24/2016) 100 each 2   No current facility-administered medications on file prior to visit.     Review of Systems:  As per HPI-  otherwise negative. No fever, chills, nausea, vomiting, chest pain. SOB or HA  Physical Examination: Vitals:   08/24/16 0906 08/24/16 0910  BP: (!) 192/110 (!) 186/110  Pulse: 63   Temp: 98.1 F (36.7 C)    Vitals:   08/24/16 0906  Weight: 218 lb 3.2 oz (99 kg)  Height: '6\' 3"'$  (  1.905 m)   Body mass index is 27.27 kg/m. Ideal Body Weight: Weight in (lb) to have BMI = 25: 199.6  GEN: WDWN, NAD, Non-toxic, A & O x 3, tall build, looks well today HEENT: Atraumatic, Normocephalic. Neck supple. No masses, No LAD.  Bilateral TM wnl, oropharynx normal.  PEERL,EOMI.   Ears and Nose: No external deformity. CV: RRR, No M/G/R. No JVD. No thrill. No extra heart sounds. PULM: CTA B, no wheezes, crackles, rhonchi. No retractions. No resp. distress. No accessory muscle use. EXTR: No c/c/e NEURO Normal gait.  PSYCH: Normally interactive. Conversant. Not depressed or anxious appearing.  Calm demeanor.    Assessment and Plan: Uncontrolled type 2 diabetes mellitus without complication, without long-term current use of insulin ([place]) - Plan: Hemoglobin A1c  Hyperlipidemia - Plan: Lipid panel  Essential hypertension - Plan: Basic metabolic panel  Cardiac pacemaker in situ  Here today to recheck his DM, HTN, cholesterol Labs as above today. Suspect his A1c will be high and he will need to go back on insulin. His BP is not controlled- he is taking losartan 25 BID and also coreg.  Will increase to losartan 50 BID and he will continue to monitor at home.  He has cardiology follow-up later on this month per his report- however I do not see this on his appts. Will need to follow-up on his BP myself as his cardiology appt is not for several weeks   Signed Lamar Blinks, MD   Called him to discuss labs 9/10- did not reach. Will send a mychart message, LMOM for pt  Results for orders placed or performed in visit on 08/24/16  Hemoglobin A1c  Result Value Ref Range   Hgb A1c MFr Bld 9.1 (H) 4.6 -  6.5 %  Lipid panel  Result Value Ref Range   Cholesterol 244 (H) 0 - 200 mg/dL   Triglycerides 190.0 (H) 0.0 - 149.0 mg/dL   HDL 33.60 (L) >39.00 mg/dL   VLDL 38.0 0.0 - 40.0 mg/dL   LDL Cholesterol 172 (H) 0 - 99 mg/dL   Total CHOL/HDL Ratio 7    NonHDL 794.32   Basic metabolic panel  Result Value Ref Range   Sodium 133 (L) 135 - 145 mEq/L   Potassium 3.9 3.5 - 5.1 mEq/L   Chloride 98 96 - 112 mEq/L   CO2 31 19 - 32 mEq/L   Glucose, Bld 214 (H) 70 - 99 mg/dL   BUN 16 6 - 23 mg/dL   Creatinine, Ser 0.92 0.40 - 1.50 mg/dL   Calcium 9.2 8.4 - 10.5 mg/dL   GFR 104.35 >60.00 mL/min

## 2016-08-27 ENCOUNTER — Encounter: Payer: Self-pay | Admitting: Family Medicine

## 2016-09-27 ENCOUNTER — Encounter: Payer: Self-pay | Admitting: Internal Medicine

## 2016-09-27 ENCOUNTER — Telehealth: Payer: Self-pay | Admitting: Hematology & Oncology

## 2016-09-27 NOTE — Telephone Encounter (Signed)
I called patient and cx 09/29/16 apt due to Md being on call.  Patient resch for 10/03/16

## 2016-09-29 ENCOUNTER — Other Ambulatory Visit: Payer: Medicare Other

## 2016-09-29 ENCOUNTER — Ambulatory Visit: Payer: Medicare Other | Admitting: Hematology & Oncology

## 2016-10-03 ENCOUNTER — Other Ambulatory Visit (HOSPITAL_BASED_OUTPATIENT_CLINIC_OR_DEPARTMENT_OTHER): Payer: Medicare Other

## 2016-10-03 ENCOUNTER — Ambulatory Visit (HOSPITAL_BASED_OUTPATIENT_CLINIC_OR_DEPARTMENT_OTHER): Payer: Medicare Other | Admitting: Hematology & Oncology

## 2016-10-03 ENCOUNTER — Encounter: Payer: Self-pay | Admitting: Hematology & Oncology

## 2016-10-03 VITALS — BP 180/106 | HR 61 | Temp 97.4°F | Resp 18 | Ht 75.0 in | Wt 219.1 lb

## 2016-10-03 DIAGNOSIS — C9000 Multiple myeloma not having achieved remission: Secondary | ICD-10-CM

## 2016-10-03 DIAGNOSIS — C9001 Multiple myeloma in remission: Secondary | ICD-10-CM | POA: Diagnosis not present

## 2016-10-03 LAB — COMPREHENSIVE METABOLIC PANEL
ALBUMIN: 3.3 g/dL — AB (ref 3.5–5.0)
ALK PHOS: 50 U/L (ref 40–150)
ALT: 19 U/L (ref 0–55)
ANION GAP: 8 meq/L (ref 3–11)
AST: 19 U/L (ref 5–34)
BUN: 13.9 mg/dL (ref 7.0–26.0)
CALCIUM: 9.8 mg/dL (ref 8.4–10.4)
CO2: 26 mEq/L (ref 22–29)
CREATININE: 0.9 mg/dL (ref 0.7–1.3)
Chloride: 101 mEq/L (ref 98–109)
EGFR: >90 mL/min/{1.73_m2} (ref >90)
Glucose: 143 mg/dl — ABNORMAL HIGH (ref 70–140)
POTASSIUM: 4.1 meq/L (ref 3.5–5.1)
SODIUM: 136 meq/L (ref 136–145)
TOTAL PROTEIN: 7.7 g/dL (ref 6.4–8.3)
Total Bilirubin: 0.41 mg/dL (ref 0.20–1.20)

## 2016-10-03 LAB — CBC WITH DIFFERENTIAL (CANCER CENTER ONLY)
BASO#: 0 10*3/uL (ref 0.0–0.2)
BASO%: 0.5 % (ref 0.0–2.0)
EOS%: 6.9 % (ref 0.0–7.0)
Eosinophils Absolute: 0.4 10*3/uL (ref 0.0–0.5)
HEMATOCRIT: 37.4 % — AB (ref 38.7–49.9)
HGB: 12.8 g/dL — ABNORMAL LOW (ref 13.0–17.1)
LYMPH#: 2.1 10*3/uL (ref 0.9–3.3)
LYMPH%: 32.2 % (ref 14.0–48.0)
MCH: 29.7 pg (ref 28.0–33.4)
MCHC: 34.2 g/dL (ref 32.0–35.9)
MCV: 87 fL (ref 82–98)
MONO#: 0.6 10*3/uL (ref 0.1–0.9)
MONO%: 9.1 % (ref 0.0–13.0)
NEUT#: 3.3 10*3/uL (ref 1.5–6.5)
NEUT%: 51.3 % (ref 40.0–80.0)
PLATELETS: 241 10*3/uL (ref 145–400)
RBC: 4.31 10*6/uL (ref 4.20–5.70)
RDW: 14 % (ref 11.1–15.7)
WBC: 6.4 10*3/uL (ref 4.0–10.0)

## 2016-10-03 LAB — LACTATE DEHYDROGENASE: LDH: 167 U/L (ref 125–245)

## 2016-10-03 NOTE — Progress Notes (Signed)
Hematology and Oncology Follow Up Visit  Michael Vanness Sr. [id number] 29-Oct-1946 70 y.o. 10/03/2016   Principle Diagnosis:   IgG kappa smoldering myeloma  Iron deficiency anemia  Current Therapy:    Observation     Interim History:  [name] is back for followup. We last saw him back in April. Since then, he is restarted on insulin. His hemoglobin A1c has been on the high side. He said it was 10.8. He is being managed by Dr. Lorelei Pont.  He is still working. He's having no problems with work. He's having no problems with fatigue or weakness. He does not have any neuropathy.  We saw him in April, his monoclonal spike was 0.9 g/dL. His IgG level was 1655 milligrams per deciliter. His Kappa Lightchain was 3.8 mg/dL.   He's had a problem with infections.  He's had no bleeding.  He's had no change in bowel or bladder habits.  He has a pacemaker and which is working quite well.  Overall, his performance status is ECOG 1.  Medications:  Current Outpatient Prescriptions:  .  carvedilol (COREG) 12.5 MG tablet, TAKE 1 TABLET BY MOUTH TWICE A DAY WITH MEALS, Disp: 60 tablet, Rfl: 6 .  diphenhydramine-acetaminophen (TYLENOL PM) 25-500 MG TABS, Take 1 tablet by mouth at bedtime as needed (sleep). Reported on 12/08/2015, Disp: , Rfl:  .  ferrous sulfate 325 (65 FE) MG tablet, Take 325 mg by mouth every other day., Disp: , Rfl:  .  glipiZIDE (GLUCOTROL XL) 5 MG 24 hr tablet, Take 1 tablet (5 mg total) by mouth 2 (two) times daily., Disp: 60 tablet, Rfl: 2 .  Glucosamine-Chondroitin-MSM (TRIPLE FLEX PO), Take 1 tablet by mouth daily. , Disp: , Rfl:  .  Insulin Glargine (LANTUS SOLOSTAR) 100 UNIT/ML Solostar Pen, Inject 6- 10 units subque daily. May adjust as needed, Disp: 5 pen, Rfl: 3 .  Lancets MISC, Use as instructed twice a day, Disp: 100 each, Rfl: 2 .  losartan (COZAAR) 50 MG tablet, Take 50 mg by mouth 2 (two) times daily., Disp: , Rfl:  .  metFORMIN (GLUCOPHAGE) 1000 MG tablet,  TAKE 1 TABLET BY MOUTH TWICE A DAY WITH MEALS., Disp: 180 tablet, Rfl: 3 .  Multiple Vitamin (MULTIVITAMIN WITH MINERALS) TABS, Take 1 tablet by mouth daily. , Disp: , Rfl:  .  Omega-3 Fatty Acids (FISH OIL PO), Take 1 capsule by mouth every other day., Disp: , Rfl:  .  ONE TOUCH ULTRA TEST test strip, USE AS INSTRUCTED TWICE A DAY, Disp: 100 each, Rfl: 11  Allergies:  Allergies  Allergen Reactions  . Amlodipine Swelling  . Crestor [Rosuvastatin] Other (See Comments)    Per pt unable to focus    Past Medical History, Surgical history, Social history, and Family History were reviewed and updated.  Review of Systems: As above  Physical Exam:  height is 6\' 3"  (1.905 m) and weight is 219 lb 1.9 oz (99.4 kg). His oral temperature is 97.4 F (36.3 C). His blood pressure is 180/106 (abnormal) and his pulse is 61. His respiration is 18 and oxygen saturation is 100%.   Well-developed well-nourished Afro-American gentleman. Lungs are clear. Cardiac exam shows a regular rate and rhythm with no murmurs rubs or bruits. Head and neck exam shows no sclera icterus. He has no adenopathy in the neck. Abdomen is soft. Has good bowel sounds. There is no fluid wave. There is no palpable liver or spleen tip. Back exam shows no tenderness over the spine,  ribs or hips. Extremities shows no clubbing cyanosis or edema. He has good range of motion of his joints. He has good strength in his extremities. Neurological exam shows no focal neurological deficits. Skin exam no rashes.  Lab Results  Component Value Date   WBC 6.4 10/03/2016   HGB 12.8 (L) 10/03/2016   HCT 37.4 (L) 10/03/2016   MCV 87 10/03/2016   PLT 241 10/03/2016     Chemistry      Component Value Date/Time   NA 133 (L) 08/24/2016 0940   NA 136 03/31/2016 1315   K 3.9 08/24/2016 0940   K 4.0 03/31/2016 1315   CL 98 08/24/2016 0940   CL 98 03/09/2015 1111   CL 108 (H) 05/15/2013 0912   CO2 31 08/24/2016 0940   CO2 25 03/31/2016 1315   BUN  16 08/24/2016 0940   BUN 16.1 03/31/2016 1315   CREATININE 0.92 08/24/2016 0940   CREATININE 1.0 03/31/2016 1315      Component Value Date/Time   CALCIUM 9.2 08/24/2016 0940   CALCIUM 9.9 03/31/2016 1315   ALKPHOS 49 03/31/2016 1315   AST 18 03/31/2016 1315   ALT 17 03/31/2016 1315   BILITOT 0.30 03/31/2016 1315         Impression and Plan: [name] is 70 year old gentleman with IgG kappa smoldering myeloma. So far, I do not see any evidence of progressive disease. His myeloma numbers are slowly trending upward. His total protein level was on the higher side this time.We will have to watch this.  For now, we will plan to get him back in another 6 months.  We will see what his myeloma studies show.    Volanda Napoleon, MD 10/17/201710:37 AM

## 2016-10-04 LAB — KAPPA/LAMBDA LIGHT CHAINS
Ig Kappa Free Light Chain: 42.9 mg/L — ABNORMAL HIGH (ref 3.3–19.4)
Ig Lambda Free Light Chain: 21.3 mg/L (ref 5.7–26.3)
KAPPA/LAMBDA FLC RATIO: 2.01 — AB (ref 0.26–1.65)

## 2016-10-04 LAB — IGG, IGA, IGM
IGA/IMMUNOGLOBULIN A, SERUM: 327 mg/dL (ref 61–437)
IgG, Qn, Serum: 1454 mg/dL (ref 700–1600)
IgM, Qn, Serum: 26 mg/dL (ref 20–172)

## 2016-10-06 LAB — PROTEIN ELECTROPHORESIS, SERUM, WITH REFLEX
A/G RATIO SPE: 0.9 (ref 0.7–1.7)
ALBUMIN: 3.6 g/dL (ref 2.9–4.4)
Alpha 1: 0.2 g/dL (ref 0.0–0.4)
Alpha 2: 0.9 g/dL (ref 0.4–1.0)
Beta: 1.2 g/dL (ref 0.7–1.3)
GLOBULIN, TOTAL: 3.8 g/dL (ref 2.2–3.9)
Gamma Globulin: 1.6 g/dL (ref 0.4–1.8)
Interpretation(See Below): 0
M-Spike, %: 0.9 g/dL — ABNORMAL HIGH
TOTAL PROTEIN: 7.4 g/dL (ref 6.0–8.5)

## 2016-10-10 ENCOUNTER — Ambulatory Visit (INDEPENDENT_AMBULATORY_CARE_PROVIDER_SITE_OTHER): Payer: Medicare Other | Admitting: Internal Medicine

## 2016-10-10 ENCOUNTER — Encounter: Payer: Self-pay | Admitting: Internal Medicine

## 2016-10-10 VITALS — BP 170/98 | HR 60 | Ht 75.0 in | Wt 220.8 lb

## 2016-10-10 DIAGNOSIS — I441 Atrioventricular block, second degree: Secondary | ICD-10-CM

## 2016-10-10 DIAGNOSIS — Z950 Presence of cardiac pacemaker: Secondary | ICD-10-CM

## 2016-10-10 LAB — CUP PACEART INCLINIC DEVICE CHECK
Brady Statistic RA Percent Paced: 5.4 %
Date Time Interrogation Session: 20171024113820
Implantable Lead Implant Date: 20160822
Implantable Lead Location: 753860
Lead Channel Impedance Value: 562.5 Ohm
Lead Channel Pacing Threshold Amplitude: 1.375 V
Lead Channel Pacing Threshold Pulse Width: 0.4 ms
Lead Channel Sensing Intrinsic Amplitude: 12 mV
Lead Channel Setting Pacing Amplitude: 2.125
Lead Channel Setting Pacing Pulse Width: 0.4 ms
Lead Channel Setting Sensing Sensitivity: 2 mV
MDC IDC LEAD IMPLANT DT: 20160822
MDC IDC LEAD LOCATION: 753859
MDC IDC MSMT BATTERY REMAINING LONGEVITY: 130.8
MDC IDC MSMT BATTERY VOLTAGE: 3.01 V
MDC IDC MSMT LEADCHNL RA IMPEDANCE VALUE: 475 Ohm
MDC IDC MSMT LEADCHNL RA PACING THRESHOLD AMPLITUDE: 1.125 V
MDC IDC MSMT LEADCHNL RA PACING THRESHOLD PULSEWIDTH: 0.4 ms
MDC IDC MSMT LEADCHNL RA SENSING INTR AMPL: 2.6 mV
MDC IDC SET LEADCHNL RV PACING AMPLITUDE: 1.625
MDC IDC STAT BRADY RV PERCENT PACED: 13 %
Pulse Gen Model: 2240
Pulse Gen Serial Number: 7799340

## 2016-10-10 MED ORDER — CARVEDILOL 25 MG PO TABS
25.0000 mg | ORAL_TABLET | Freq: Two times a day (BID) | ORAL | 3 refills | Status: DC
Start: 2016-10-10 — End: 2017-10-09

## 2016-10-10 NOTE — Progress Notes (Signed)
HPI [name] returns today for followup. He is a pleasant 70 yo man with symptomatic bradycardia, s/p PPM insertion. In the interim, he has gone back to exercising. He denies chest pain or sob. He notes some incisional pain. He has had some problem with his blood pressure being elevated. Today he did not take his morning meds.  Allergies  Allergen Reactions  . Amlodipine Swelling  . Crestor [Rosuvastatin] Other (See Comments)    Per pt unable to focus     Current Outpatient Prescriptions  Medication Sig Dispense Refill  . diphenhydramine-acetaminophen (TYLENOL PM) 25-500 MG TABS Take 1 tablet by mouth at bedtime as needed (sleep). Reported on 12/08/2015    . ferrous sulfate 325 (65 FE) MG tablet Take 325 mg by mouth every other day.    Marland Kitchen glipiZIDE (GLUCOTROL XL) 5 MG 24 hr tablet Take 1 tablet (5 mg total) by mouth 2 (two) times daily. 60 tablet 2  . Glucosamine-Chondroitin-MSM (TRIPLE FLEX PO) Take 1 tablet by mouth daily.     . Insulin Glargine (LANTUS SOLOSTAR) 100 UNIT/ML Solostar Pen Inject 6- 10 units subque daily. May adjust as needed 5 pen 3  . Lancets MISC Use as instructed twice a day 100 each 2  . losartan (COZAAR) 50 MG tablet Take 50 mg by mouth 2 (two) times daily.    . metFORMIN (GLUCOPHAGE) 1000 MG tablet TAKE 1 TABLET BY MOUTH TWICE A DAY WITH MEALS. 180 tablet 3  . Multiple Vitamin (MULTIVITAMIN WITH MINERALS) TABS Take 1 tablet by mouth daily.     . Omega-3 Fatty Acids (FISH OIL PO) Take 1 capsule by mouth every other day.    . ONE TOUCH ULTRA TEST test strip USE AS INSTRUCTED TWICE A DAY 100 each 11  . carvedilol (COREG) 25 MG tablet Take 1 tablet (25 mg total) by mouth 2 (two) times daily. 180 tablet 3   No current facility-administered medications for this visit.      Past Medical History:  Diagnosis Date  . Anemia   . Bundle branch block, right   . Cataract   . Diabetes mellitus   . GERD (gastroesophageal reflux disease)   . Hyperlipidemia   .  Hypertension   . MGUS (monoclonal gammopathy of unknown significance)   . Multiple myeloma ([place]) 02/2013   normal cytogenetics and FISH panel on 03/11/2013.   . Osteoarthritis   . Right bundle branch block   . Syncope     ROS:   All systems reviewed and negative except as noted in the HPI.   Past Surgical History:  Procedure Laterality Date  . EP IMPLANTABLE DEVICE N/A 08/09/2015   Procedure: Pacemaker Implant;  Surgeon: [name], MD;  Location: [facility];  Service: Cardiovascular;  Laterality: N/A;  . EYE SURGERY       Family History  Problem Relation Age of Onset  . Hypertension Mother   . Cancer Mother   . Stroke Mother   . Hypertension Sister   . Hypertension Brother   . Hypertension Maternal Grandmother      Social History   Social History  . Marital status: Married    Spouse name: N/A  . Number of children: N/A  . Years of education: N/A   Occupational History  . Not on file.   Social History Main Topics  . Smoking status: Former Smoker    Packs/day: 2.00    Years: 35.00    Types: Cigarettes  Start date: 03/26/1963    Quit date: 12/18/1997  . Smokeless tobacco: Never Used     Comment: quit 15 years  . Alcohol use No  . Drug use: No  . Sexual activity: Yes    Birth control/ protection: None   Other Topics Concern  . Not on file   Social History Narrative   Married   Building Maintenance        BP (!) 170/98   Pulse 60   Ht '6\' 3"'$  (1.905 m)   Wt 220 lb 12.8 oz (100.2 kg)   BMI 27.60 kg/m   Physical Exam:  Well appearing 70 yo man, looking younger than his stated age, NAD HEENT: Unremarkable Neck:  6 cm JVD, no thyromegally Lymphatics:  No adenopathy Back:  No CVA tenderness Lungs:  Clear with no wheezes. Device appears to be well healed.  HEART:  Regular rate rhythm, no murmurs, no rubs, no clicks Abd:  soft, positive bowel sounds, no organomegally, no rebound, no guarding Ext:  2 plus pulses, no edema, no cyanosis, no  clubbing Skin:  No rashes no nodules Neuro:  CN II through XII intact, motor grossly intact  EKG - nsr with AV pacing  DEVICE  Normal device function.  See PaceArt for details.   Assess/Plan: 1. Uncontrolled HTN - his blood pressure is high at home. I have asked him to increase his coreg to 25 mg bid. He will continue his other medications. 2. Atrial tachy - he is asymptomatic. He is out of rhythm less than one percent of the time in atrial tachy 3. PPM - his [employer] Jude device is working normally. Will recheck in several months.  Mikle Bosworth.D.

## 2016-10-10 NOTE — Patient Instructions (Addendum)
Medication Instructions:  INCREASE Carvedilol to 25 mg twice a day.   Labwork: None Ordered    Testing/Procedures: None Ordered   Follow-Up: Your physician wants you to follow-up in: 1 year with Dr. Lovena Le.  You will receive a reminder letter in the mail two months in advance. If you don't receive a letter, please call our office to schedule the follow-up appointment.  Remote monitoring is used to monitor your Pacemaker from home. This monitoring reduces the number of office visits required to check your device to one time per year. It allows [place] to keep an eye on the functioning of your device to ensure it is working properly. You are scheduled for a device check from home on 01/09/17. You may send your transmission at any time that day. If you have a wireless device, the transmission will be sent automatically. After your physician reviews your transmission, you will receive a postcard with your next transmission date.    Any Other Special Instructions Will Be Listed Below (If Applicable).     If you need a refill on your cardiac medications before your next appointment, please call your pharmacy.

## 2016-11-17 LAB — HM DIABETES EYE EXAM

## 2016-11-19 ENCOUNTER — Other Ambulatory Visit: Payer: Self-pay | Admitting: Family Medicine

## 2016-11-23 ENCOUNTER — Encounter: Payer: Self-pay | Admitting: Family Medicine

## 2016-11-23 ENCOUNTER — Ambulatory Visit (INDEPENDENT_AMBULATORY_CARE_PROVIDER_SITE_OTHER): Payer: Medicare Other | Admitting: Family Medicine

## 2016-11-23 ENCOUNTER — Other Ambulatory Visit: Payer: Self-pay | Admitting: Family Medicine

## 2016-11-23 VITALS — BP 165/102 | HR 59 | Temp 97.8°F | Ht 75.0 in | Wt 223.0 lb

## 2016-11-23 DIAGNOSIS — I10 Essential (primary) hypertension: Secondary | ICD-10-CM | POA: Diagnosis not present

## 2016-11-23 DIAGNOSIS — L602 Onychogryphosis: Secondary | ICD-10-CM

## 2016-11-23 DIAGNOSIS — Z794 Long term (current) use of insulin: Secondary | ICD-10-CM

## 2016-11-23 DIAGNOSIS — L918 Other hypertrophic disorders of the skin: Secondary | ICD-10-CM | POA: Diagnosis not present

## 2016-11-23 DIAGNOSIS — IMO0001 Reserved for inherently not codable concepts without codable children: Secondary | ICD-10-CM

## 2016-11-23 DIAGNOSIS — E1165 Type 2 diabetes mellitus with hyperglycemia: Secondary | ICD-10-CM

## 2016-11-23 NOTE — Patient Instructions (Addendum)
It was very nice to see you today!   We removed the small growth from your back- if you have any concerns or signs of infection please let me know Your A1c is better!  Please try increasing your lantus to 7 units a day.  If you do not have any problems with low blood sugars we will try increasing to 8 units.    Please come and see me for an A1c in 3 months.     We worked on your nails some today, but I would also recommend that you visit a good nail shop for a pedicure.  They can soak your nails, trim and film them even better.   Your blood pressure is still high- I think we will add a fluid pill to your regimen.  I will run this by Dr. Lovena Le first but assuming ok with him we will add HCTZ  You should generally avoid NSAID meds like aleve or ibuprofen as they will raise your BP- tylenol is ok

## 2016-11-23 NOTE — Progress Notes (Addendum)
[facility] [address], [place], [place] [address] [phone]  Date:  11/23/2016   Name:  Michael Poffenberger Sr.   DOB:  10-09-1946   MRN:  [id number]  PCP:  Lamar Blinks, MD    Chief Complaint: Follow-up (Pt here for diabetes f/u. Nurse from [facility] came out on 11/11/2016 to check A1C, results: 8.2. Pt wanted to know which medication he shouldn't take, Tylenol PM or Advil.)   History of Present Illness:  Michael Swayze Sr. is a 70 y.o. very pleasant male patient who presents with the following:  Here today for a follow-up visit Recent visit from [facility] nurse- he had an A1c at 8.2% which is better than in September.  They also checked his peripheral vascular status by ?putting an o2 sat meter on his toe.  Advised pt that per my knowledge you cannot measure vascular status in this way. They may have used another testing modality- unsure  Lab Results  Component Value Date   HGBA1C 9.1 (H) 08/24/2016   He is on 6 units lantus right now. This am his glucose was 127. He notes that his glucose is generally 110- 130 when he checks it at home He is taking metformin BID and glipizide BID  Natosha, NP from home health called to report that pt had peripheral arterial disease screening done in the home and his results came back as severe. Report will be sent over. Would like to know if you would like to order additional testing for pt. Please advise.   He has history of OA in his toes, he denies any sx of claudication.    He has a bothersome skin tag on his back that he would like removed today- will do this for him He also notes that his toenails are quite long, thick and hard for him to cut.  He wonders what he should do about this problem  BP Readings from Last 3 Encounters:  11/23/16 (!) 165/102  10/10/16 (!) 170/98  10/03/16 (!) 180/106   He recently saw Dr. Lovena Le and they increased his coreg to BID. His  BP is better but still high.  He cannot tolerate amlodipine, he is also on max losartan but not any diuretic  Most recent BUN and creat looked fine. Excellent GFR Patient Active Problem List   Diagnosis Date Noted  . Pacemaker 12/08/2015  . Mobitz type 2 second degree heart block 08/08/2015  . Right bundle branch block 12/16/2014  . Syncope 12/16/2014  . Proteinuria due to type 2 diabetes mellitus ([place]) 11/12/2014  . Encounter for therapeutic drug monitoring 03/13/2013  . Multiple myeloma ([place]) 02/15/2013  . Edema 01/07/2013  . Diabetes mellitus due to underlying condition with other diabetic kidney complication   . Hyperlipidemia   . Hypertension   . MGUS (monoclonal gammopathy of unknown significance)   . GERD (gastroesophageal reflux disease)     Past Medical History:  Diagnosis Date  . Anemia   . Bundle branch block, right   . Cataract   . Diabetes mellitus   . GERD (gastroesophageal reflux disease)   . Hyperlipidemia   . Hypertension   . MGUS (monoclonal gammopathy of unknown significance)   . Multiple myeloma ([place]) 02/2013   normal cytogenetics and FISH panel on 03/11/2013.   . Osteoarthritis   . Right bundle branch block   . Syncope     Past Surgical History:  Procedure Laterality Date  .  EP IMPLANTABLE DEVICE N/A 08/09/2015   Procedure: Pacemaker Implant;  Surgeon: [name], MD;  Location: [facility] [place];  Service: Cardiovascular;  Laterality: N/A;  . EYE SURGERY      Social History  Substance Use Topics  . Smoking status: Former Smoker    Packs/day: 2.00    Years: 35.00    Types: Cigarettes    Start date: 03/26/1963    Quit date: 12/18/1997  . Smokeless tobacco: Never Used     Comment: quit 15 years  . Alcohol use No    Family History  Problem Relation Age of Onset  . Hypertension Mother   . Cancer Mother   . Stroke Mother   . Hypertension Sister   . Hypertension Brother   . Hypertension Maternal Grandmother     Allergies  Allergen  Reactions  . Amlodipine Swelling  . Crestor [Rosuvastatin] Other (See Comments)    Per pt unable to focus    Medication list has been reviewed and updated.  Current Outpatient Prescriptions on File Prior to Visit  Medication Sig Dispense Refill  . carvedilol (COREG) 25 MG tablet Take 1 tablet (25 mg total) by mouth 2 (two) times daily. 180 tablet 3  . diphenhydramine-acetaminophen (TYLENOL PM) 25-500 MG TABS Take 1 tablet by mouth at bedtime as needed (sleep). Reported on 12/08/2015    . ferrous sulfate 325 (65 FE) MG tablet Take 325 mg by mouth every other day.    Marland Kitchen glipiZIDE (GLUCOTROL XL) 5 MG 24 hr tablet Take 1 tablet (5 mg total) by mouth 2 (two) times daily. 60 tablet 2  . Glucosamine-Chondroitin-MSM (TRIPLE FLEX PO) Take 1 tablet by mouth daily.     . Insulin Glargine (LANTUS SOLOSTAR) 100 UNIT/ML Solostar Pen Inject 6- 10 units subque daily. May adjust as needed 5 pen 3  . Lancets MISC Use as instructed twice a day 100 each 2  . losartan (COZAAR) 50 MG tablet Take 50 mg by mouth 2 (two) times daily.    . metFORMIN (GLUCOPHAGE) 1000 MG tablet TAKE 1 TABLET BY MOUTH TWICE A DAY WITH MEALS. 180 tablet 3  . Multiple Vitamin (MULTIVITAMIN WITH MINERALS) TABS Take 1 tablet by mouth daily.     . Omega-3 Fatty Acids (FISH OIL PO) Take 1 capsule by mouth every other day.    . ONE TOUCH ULTRA TEST test strip USE AS INSTRUCTED TWICE A DAY 100 each 3   No current facility-administered medications on file prior to visit.     Review of Systems:  As per HPI- otherwise negative.   Physical Examination: Vitals:   11/23/16 0934 11/23/16 0940  BP: (!) 162/98 (!) 164/98  Pulse: (!) 59   Temp: 97.8 F (36.6 C)    Vitals:   11/23/16 0934  Weight: 223 lb (101.2 kg)  Height: 6' 3" (1.905 m)   Body mass index is 27.87 kg/m. Ideal Body Weight: Weight in (lb) to have BMI = 25: 199.6  GEN: WDWN, NAD, Non-toxic, A & O x 3, normal weight, looks well HEENT: Atraumatic, Normocephalic. Neck  supple. No masses, No LAD. Ears and Nose: No external deformity. CV: RRR, No M/G/R. No JVD. No thrill. No extra heart sounds. PULM: CTA B, no wheezes, crackles, rhonchi. No retractions. No resp. distress. No accessory muscle use. ABD: S, NT, ND EXTR: No c/c/e NEURO Normal gait.  PSYCH: Normally interactive. Conversant. Not depressed or anxious appearing.  Calm demeanor.   He has long and thick toenails.  Trimmed some with a nail nipper today  VC obtained- prepped skin tag on his back with alcohol pad. Anesthesia with 1% lido, removed with 11 blade.  Dressed with band-aid  Assessment and Plan: Uncontrolled type 2 diabetes mellitus without complication, with long-term current use of insulin ([place])  Essential hypertension  Skin tag  Hypertrophic toenail  Here today to recheck on his DM.  He reports very good glucose readings at home- however his A1c is still over goal.  Will cautiously increase his lantus just a bit from 6 to 8 units.  Continue oral hypoglycemics His BP is better but still high.  Will touch base with dr. Lovena Le but would like to add HCTZ.  We will need to monitor his kidneys closely after this change Removed skin tag as above.  Offered to sent to path- pt declined.  We do not suspect a malignancy Trimmed his nails- encouraged him to visit a nail shop to have a full pedicure.  He has seen podiatry but does not want to do back at this time due to expense   Signed Lamar Blinks, MD  Per Dr. Lovena Le ok to add HCTZ.  Will change to losartan hct 100/12.5 and he can take it once a day in the morning.  Called pt and LMOM- will change to losartan HCT, 100/12.5, take in the am.  Please see me in 2-3 months for a recheck- will need to monitor his renal function closely as he does have h/o proteinuria.  However at last labs his GFR was >90

## 2016-11-23 NOTE — Progress Notes (Signed)
Pre visit review using our clinic review tool, if applicable. No additional management support is needed unless otherwise documented below in the visit note. 

## 2016-11-25 MED ORDER — LOSARTAN POTASSIUM-HCTZ 100-12.5 MG PO TABS: 1.0000 | ORAL_TABLET | Freq: Every day | ORAL | 3 refills | Status: DC

## 2016-11-25 NOTE — Addendum Note (Signed)
Addended by: Lamar Blinks C on: 11/25/2016 02:20 PM   Modules accepted: Orders

## 2017-01-09 ENCOUNTER — Ambulatory Visit (INDEPENDENT_AMBULATORY_CARE_PROVIDER_SITE_OTHER): Payer: Medicare Other | Admitting: *Deleted

## 2017-01-09 DIAGNOSIS — I441 Atrioventricular block, second degree: Secondary | ICD-10-CM

## 2017-01-10 DIAGNOSIS — H40013 Open angle with borderline findings, low risk, bilateral: Secondary | ICD-10-CM | POA: Diagnosis not present

## 2017-01-10 LAB — CUP PACEART REMOTE DEVICE CHECK
Brady Statistic AP VP Percent: 7.5 %
Brady Statistic AP VS Percent: 1.3 %
Brady Statistic AS VP Percent: 12 %
Brady Statistic AS VS Percent: 79 %
Brady Statistic RV Percent Paced: 19 %
Implantable Lead Implant Date: 20160822
Implantable Lead Location: 753859
Lead Channel Impedance Value: 550 Ohm
Lead Channel Pacing Threshold Amplitude: 1.25 V
Lead Channel Pacing Threshold Pulse Width: 0.4 ms
Lead Channel Sensing Intrinsic Amplitude: 1.8 mV
Lead Channel Sensing Intrinsic Amplitude: 12 mV
Lead Channel Setting Pacing Amplitude: 1.5 V
Lead Channel Setting Pacing Amplitude: 2.25 V
Lead Channel Setting Pacing Pulse Width: 0.4 ms
Lead Channel Setting Sensing Sensitivity: 2 mV
MDC IDC LEAD IMPLANT DT: 20160822
MDC IDC LEAD LOCATION: 753860
MDC IDC MSMT BATTERY REMAINING LONGEVITY: 130 mo
MDC IDC MSMT BATTERY REMAINING PERCENTAGE: 95.5 %
MDC IDC MSMT BATTERY VOLTAGE: 3.01 V
MDC IDC MSMT LEADCHNL RA IMPEDANCE VALUE: 460 Ohm
MDC IDC MSMT LEADCHNL RA PACING THRESHOLD AMPLITUDE: 1.25 V
MDC IDC MSMT LEADCHNL RA PACING THRESHOLD PULSEWIDTH: 0.4 ms
MDC IDC PG IMPLANT DT: 20160822
MDC IDC PG SERIAL: 7799340
MDC IDC SESS DTM: 20180123070015
MDC IDC STAT BRADY RA PERCENT PACED: 8.2 %

## 2017-01-10 NOTE — Progress Notes (Signed)
Remote pacemaker transmission.   

## 2017-01-11 ENCOUNTER — Encounter: Payer: Self-pay | Admitting: Cardiology

## 2017-01-26 ENCOUNTER — Encounter: Payer: Self-pay | Admitting: Cardiology

## 2017-02-06 DIAGNOSIS — E1129 Type 2 diabetes mellitus with other diabetic kidney complication: Secondary | ICD-10-CM | POA: Diagnosis not present

## 2017-02-06 DIAGNOSIS — R809 Proteinuria, unspecified: Secondary | ICD-10-CM | POA: Diagnosis not present

## 2017-02-06 DIAGNOSIS — I10 Essential (primary) hypertension: Secondary | ICD-10-CM | POA: Diagnosis not present

## 2017-02-06 DIAGNOSIS — N181 Chronic kidney disease, stage 1: Secondary | ICD-10-CM | POA: Diagnosis not present

## 2017-02-07 ENCOUNTER — Telehealth: Payer: Self-pay | Admitting: Family Medicine

## 2017-02-07 NOTE — Telephone Encounter (Signed)
error:[id number] ° °

## 2017-02-08 DIAGNOSIS — H401131 Primary open-angle glaucoma, bilateral, mild stage: Secondary | ICD-10-CM | POA: Diagnosis not present

## 2017-02-14 ENCOUNTER — Other Ambulatory Visit: Payer: Self-pay | Admitting: Family Medicine

## 2017-03-11 ENCOUNTER — Other Ambulatory Visit: Payer: Self-pay | Admitting: Family Medicine

## 2017-04-03 ENCOUNTER — Other Ambulatory Visit (HOSPITAL_BASED_OUTPATIENT_CLINIC_OR_DEPARTMENT_OTHER): Payer: Medicare Other

## 2017-04-03 ENCOUNTER — Ambulatory Visit (HOSPITAL_BASED_OUTPATIENT_CLINIC_OR_DEPARTMENT_OTHER): Payer: Medicare Other | Admitting: Hematology & Oncology

## 2017-04-03 VITALS — BP 168/97 | HR 60 | Temp 97.8°F | Resp 17 | Wt 228.4 lb

## 2017-04-03 DIAGNOSIS — C9000 Multiple myeloma not having achieved remission: Secondary | ICD-10-CM

## 2017-04-03 DIAGNOSIS — D509 Iron deficiency anemia, unspecified: Secondary | ICD-10-CM | POA: Diagnosis not present

## 2017-04-03 DIAGNOSIS — C9001 Multiple myeloma in remission: Secondary | ICD-10-CM

## 2017-04-03 DIAGNOSIS — D500 Iron deficiency anemia secondary to blood loss (chronic): Secondary | ICD-10-CM

## 2017-04-03 LAB — CBC WITH DIFFERENTIAL (CANCER CENTER ONLY)
BASO#: 0 10*3/uL (ref 0.0–0.2)
BASO%: 0.4 % (ref 0.0–2.0)
EOS%: 6.3 % (ref 0.0–7.0)
Eosinophils Absolute: 0.5 10*3/uL (ref 0.0–0.5)
HEMATOCRIT: 37 % — AB (ref 38.7–49.9)
HEMOGLOBIN: 12.5 g/dL — AB (ref 13.0–17.1)
LYMPH#: 2.2 10*3/uL (ref 0.9–3.3)
LYMPH%: 29.7 % (ref 14.0–48.0)
MCH: 29.4 pg (ref 28.0–33.4)
MCHC: 33.8 g/dL (ref 32.0–35.9)
MCV: 87 fL (ref 82–98)
MONO#: 0.7 10*3/uL (ref 0.1–0.9)
MONO%: 10.1 % (ref 0.0–13.0)
NEUT%: 53.5 % (ref 40.0–80.0)
NEUTROS ABS: 3.9 10*3/uL (ref 1.5–6.5)
Platelets: 247 10*3/uL (ref 145–400)
RBC: 4.25 10*6/uL (ref 4.20–5.70)
RDW: 14.4 % (ref 11.1–15.7)
WBC: 7.4 10*3/uL (ref 4.0–10.0)

## 2017-04-03 LAB — COMPREHENSIVE METABOLIC PANEL
ALBUMIN: 3.1 g/dL — AB (ref 3.5–5.0)
ALK PHOS: 46 U/L (ref 40–150)
ALT: 19 U/L (ref 0–55)
AST: 18 U/L (ref 5–34)
Anion Gap: 8 mEq/L (ref 3–11)
BUN: 16.3 mg/dL (ref 7.0–26.0)
CO2: 26 mEq/L (ref 22–29)
Calcium: 9 mg/dL (ref 8.4–10.4)
Chloride: 102 mEq/L (ref 98–109)
Creatinine: 1 mg/dL (ref 0.7–1.3)
EGFR: 86 mL/min/{1.73_m2} — AB (ref >90)
GLUCOSE: 166 mg/dL — AB (ref 70–140)
POTASSIUM: 4.2 meq/L (ref 3.5–5.1)
SODIUM: 137 meq/L (ref 136–145)
Total Bilirubin: 0.38 mg/dL (ref 0.20–1.20)
Total Protein: 7 g/dL (ref 6.4–8.3)

## 2017-04-03 NOTE — Progress Notes (Signed)
Hematology and Oncology Follow Up Visit  Michael Pine Sr. [id number] [birth year] 71 y.o. 04/03/2017   Principle Diagnosis:   IgG kappa smoldering myeloma  Iron deficiency anemia  Current Therapy:    Observation     Interim History:  [name] is back for followup. He looks great. He is still working. He is trying his blood sugars under better control. He is doing insulin 6 units daily now.  We'll last saw him in October 2017, his M spike was stable at 0.9 g/dL. His IgG level was 1454 mg/dL. His Kappa Light chain was 4.3 mg/dL.  He's had no pain. He's had no bleeding. He's had no change in bowel or bladder habits.  Overall, his performance status is ECOG 1.  Medications:  Current Outpatient Prescriptions:  .  B-D UF III MINI PEN NEEDLES 31G X 5 MM MISC, USE AS DIRECTED EVERY DAY, Disp: 100 each, Rfl: 0 .  carvedilol (COREG) 25 MG tablet, Take 1 tablet (25 mg total) by mouth 2 (two) times daily., Disp: 180 tablet, Rfl: 3 .  diphenhydramine-acetaminophen (TYLENOL PM) 25-500 MG TABS, Take 1 tablet by mouth at bedtime as needed (sleep). Reported on 12/08/2015, Disp: , Rfl:  .  ferrous sulfate 325 (65 FE) MG tablet, Take 325 mg by mouth every other day., Disp: , Rfl:  .  glipiZIDE (GLUCOTROL XL) 5 MG 24 hr tablet, TAKE 1 TABLET BY MOUTH TWICE A DAY, Disp: 60 tablet, Rfl: 2 .  Glucosamine-Chondroitin-MSM (TRIPLE FLEX PO), Take 1 tablet by mouth daily. , Disp: , Rfl:  .  Insulin Glargine (LANTUS SOLOSTAR) 100 UNIT/ML Solostar Pen, Inject 6- 10 units subque daily. May adjust as needed, Disp: 5 pen, Rfl: 3 .  Lancets MISC, Use as instructed twice a day, Disp: 100 each, Rfl: 2 .  latanoprost (XALATAN) 0.005 % ophthalmic solution, , Disp: , Rfl:  .  losartan-hydrochlorothiazide (HYZAAR) 100-12.5 MG tablet, Take 1 tablet by mouth daily., Disp: 90 tablet, Rfl: 3 .  metFORMIN (GLUCOPHAGE) 1000 MG tablet, TAKE 1 TABLET BY MOUTH TWICE A DAY WITH MEALS., Disp: 180 tablet, Rfl: 3 .  Multiple  Vitamin (MULTIVITAMIN WITH MINERALS) TABS, Take 1 tablet by mouth daily. , Disp: , Rfl:  .  Omega-3 Fatty Acids (FISH OIL PO), Take 1 capsule by mouth every other day., Disp: , Rfl:  .  ONE TOUCH ULTRA TEST test strip, USE AS INSTRUCTED TWICE A DAY, Disp: 100 each, Rfl: 3  Allergies:  Allergies  Allergen Reactions  . Amlodipine Swelling  . Crestor [Rosuvastatin] Other (See Comments)    Per pt unable to focus    Past Medical History, Surgical history, Social history, and Family History were reviewed and updated.  Review of Systems: As above  Physical Exam:  weight is 228 lb 6.4 oz (103.6 kg). His oral temperature is 97.8 F (36.6 C). His blood pressure is 168/97 (abnormal) and his pulse is 60. His respiration is 17 and oxygen saturation is 100%.   Well-developed well-nourished Afro-American gentleman. Lungs are clear. Cardiac exam shows a regular rate and rhythm with no murmurs rubs or bruits. Head and neck exam shows no sclera icterus. He has no adenopathy in the neck. Abdomen is soft. Has good bowel sounds. There is no fluid wave. There is no palpable liver or spleen tip. Back exam shows no tenderness over the spine, ribs or hips. Extremities shows no clubbing cyanosis or edema. He has good range of motion of his joints. He has good strength in  his extremities. Neurological exam shows no focal neurological deficits. Skin exam no rashes.  Lab Results  Component Value Date   WBC 7.4 04/03/2017   HGB 12.5 (L) 04/03/2017   HCT 37.0 (L) 04/03/2017   MCV 87 04/03/2017   PLT 247 04/03/2017     Chemistry      Component Value Date/Time   NA 136 10/03/2016 0936   K 4.1 10/03/2016 0936   CL 98 08/24/2016 0940   CL 98 03/09/2015 1111   CL 108 (H) 05/15/2013 0912   CO2 26 10/03/2016 0936   BUN 13.9 10/03/2016 0936   CREATININE 0.9 10/03/2016 0936      Component Value Date/Time   CALCIUM 9.8 10/03/2016 0936   ALKPHOS 50 10/03/2016 0936   AST 19 10/03/2016 0936   ALT 19 10/03/2016  0936   BILITOT 0.41 10/03/2016 0936         Impression and Plan: Michael Wiggins is 71 year old gentleman with IgG kappa smoldering myeloma. So far, I do not see any evidence of progressive disease.   He is totally asymptomatic. He is working. He loves to work. He does incredibly physical work.  We will plan to get him back in 6 months. He likes having six-month follow-up.    Volanda Napoleon, MD 4/17/20189:28 AM

## 2017-04-04 LAB — IGG, IGA, IGM
IGA/IMMUNOGLOBULIN A, SERUM: 319 mg/dL (ref 61–437)
IgM, Qn, Serum: 23 mg/dL (ref 15–143)

## 2017-04-04 LAB — KAPPA/LAMBDA LIGHT CHAINS
IG LAMBDA FREE LIGHT CHAIN: 22.3 mg/L (ref 5.7–26.3)
Ig Kappa Free Light Chain: 53 mg/L — ABNORMAL HIGH (ref 3.3–19.4)
Kappa/Lambda FluidC Ratio: 2.38 — ABNORMAL HIGH (ref 0.26–1.65)

## 2017-04-05 ENCOUNTER — Encounter: Payer: Self-pay | Admitting: *Deleted

## 2017-04-05 LAB — PROTEIN ELECTROPHORESIS, SERUM, WITH REFLEX
A/G Ratio: 0.9 (ref 0.7–1.7)
ALPHA 1: 0.2 g/dL (ref 0.0–0.4)
ALPHA 2: 0.9 g/dL (ref 0.4–1.0)
Albumin: 3 g/dL (ref 2.9–4.4)
Beta: 1.1 g/dL (ref 0.7–1.3)
Gamma Globulin: 1.4 g/dL (ref 0.4–1.8)
Globulin, Total: 3.5 g/dL (ref 2.2–3.9)
Interpretation(See Below): 0
M-Spike, %: 0.8 g/dL — ABNORMAL HIGH
Total Protein: 6.5 g/dL (ref 6.0–8.5)

## 2017-04-10 ENCOUNTER — Ambulatory Visit (INDEPENDENT_AMBULATORY_CARE_PROVIDER_SITE_OTHER): Payer: Medicare Other | Admitting: *Deleted

## 2017-04-10 DIAGNOSIS — I441 Atrioventricular block, second degree: Secondary | ICD-10-CM

## 2017-04-10 NOTE — Progress Notes (Signed)
Remote pacemaker transmission.   

## 2017-04-12 LAB — CUP PACEART REMOTE DEVICE CHECK
Brady Statistic AP VP Percent: 7.4 %
Brady Statistic AS VP Percent: 17 %
Brady Statistic RA Percent Paced: 8 %
Implantable Lead Implant Date: 20160822
Implantable Lead Location: 753859
Implantable Lead Location: 753860
Lead Channel Impedance Value: 560 Ohm
Lead Channel Pacing Threshold Amplitude: 1.5 V
Lead Channel Pacing Threshold Pulse Width: 0.4 ms
Lead Channel Sensing Intrinsic Amplitude: 12 mV
Lead Channel Setting Pacing Amplitude: 2.125
Lead Channel Setting Pacing Pulse Width: 0.4 ms
MDC IDC LEAD IMPLANT DT: 20160822
MDC IDC MSMT BATTERY REMAINING LONGEVITY: 131 mo
MDC IDC MSMT BATTERY REMAINING PERCENTAGE: 95.5 %
MDC IDC MSMT BATTERY VOLTAGE: 3.01 V
MDC IDC MSMT LEADCHNL RA IMPEDANCE VALUE: 460 Ohm
MDC IDC MSMT LEADCHNL RA PACING THRESHOLD AMPLITUDE: 1.125 V
MDC IDC MSMT LEADCHNL RA SENSING INTR AMPL: 1.9 mV
MDC IDC MSMT LEADCHNL RV PACING THRESHOLD PULSEWIDTH: 0.4 ms
MDC IDC PG IMPLANT DT: 20160822
MDC IDC SESS DTM: 20180424060016
MDC IDC SET LEADCHNL RV PACING AMPLITUDE: 1.75 V
MDC IDC SET LEADCHNL RV SENSING SENSITIVITY: 2 mV
MDC IDC STAT BRADY AP VS PERCENT: 1.2 %
MDC IDC STAT BRADY AS VS PERCENT: 74 %
MDC IDC STAT BRADY RV PERCENT PACED: 25 %
Pulse Gen Model: 2240
Pulse Gen Serial Number: 7799340

## 2017-04-13 ENCOUNTER — Encounter: Payer: Self-pay | Admitting: Cardiology

## 2017-04-27 ENCOUNTER — Encounter: Payer: Self-pay | Admitting: Cardiology

## 2017-05-09 DIAGNOSIS — H401131 Primary open-angle glaucoma, bilateral, mild stage: Secondary | ICD-10-CM | POA: Diagnosis not present

## 2017-05-15 ENCOUNTER — Other Ambulatory Visit: Payer: Self-pay | Admitting: Family Medicine

## 2017-05-22 IMAGING — CR DG CHEST 2V
2 series · 2 of 2 positions shown · non-contrast
Comparison: 04/16/2015

CLINICAL DATA: Cough.  Bradycardia.  Presyncope.

EXAM:
CHEST  2 VIEW

[PA]
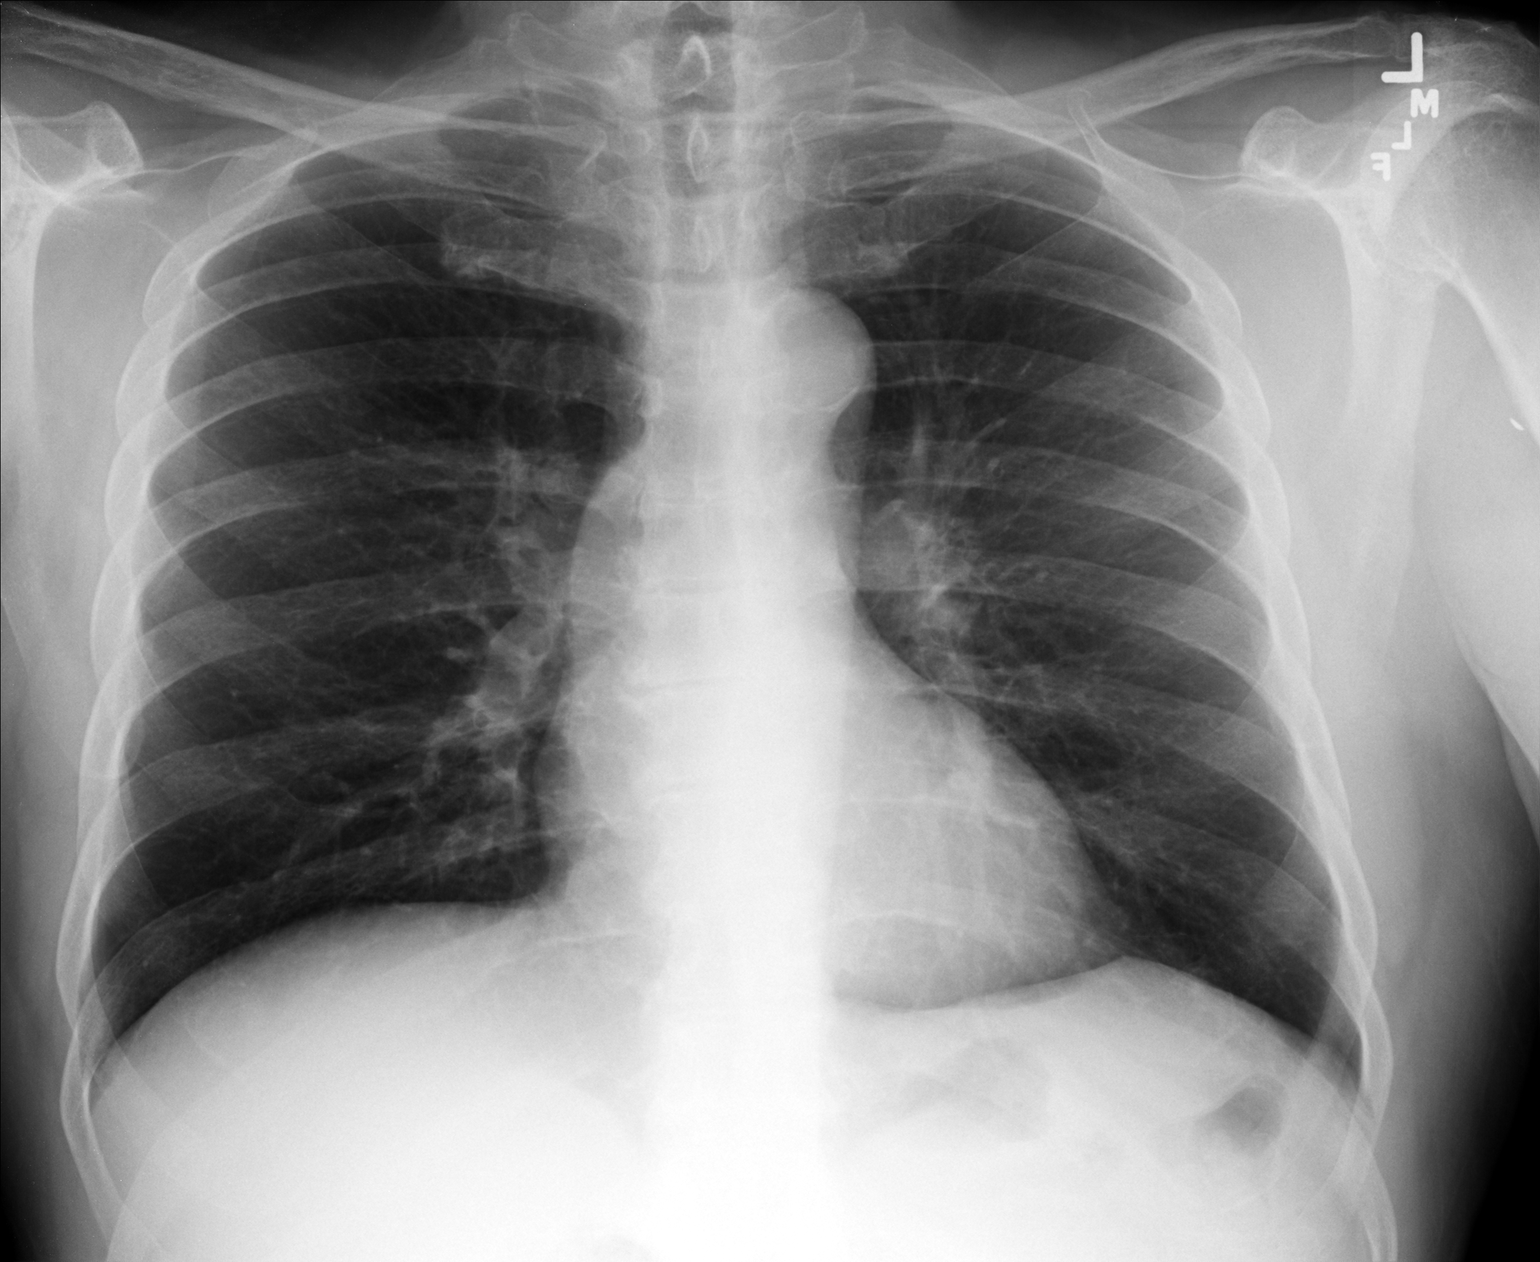

[lateral]
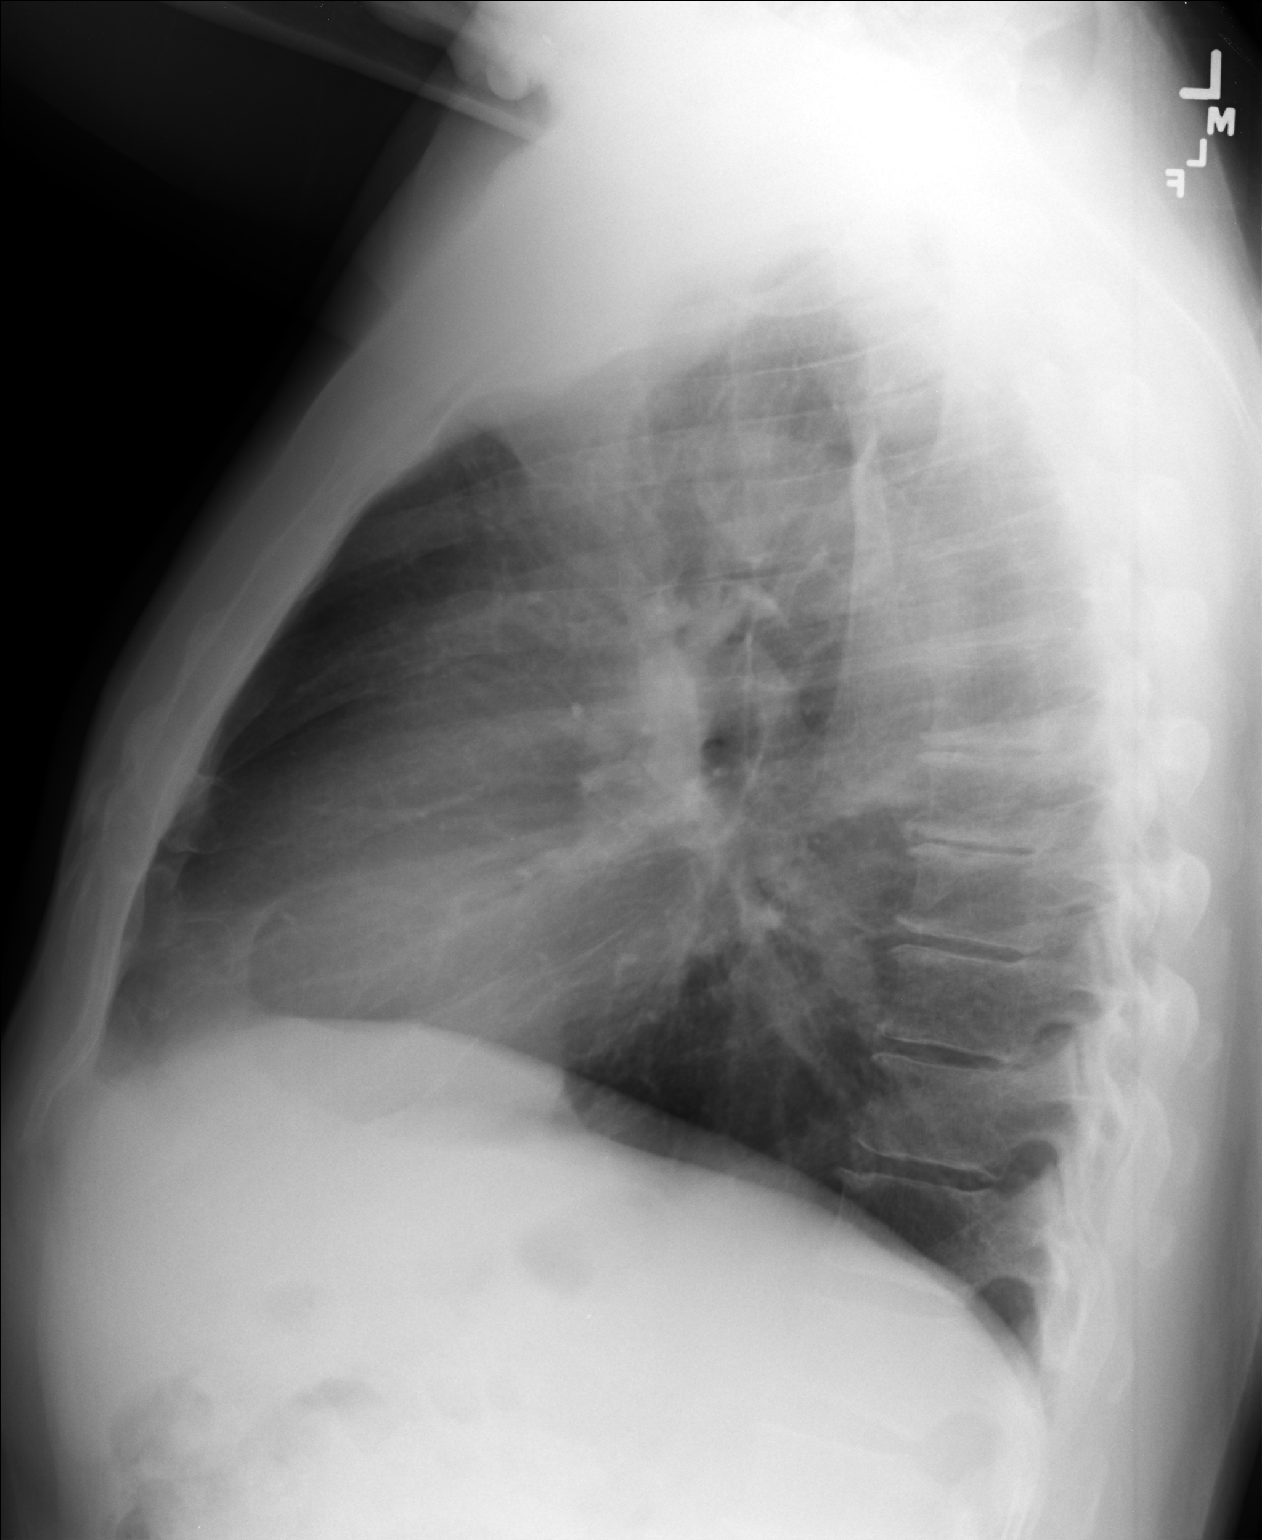

[2 of 2 positions shown; findings below may reference images not displayed]

FINDINGS: The heart size and mediastinal contours are within normal limits.
Both lungs are clear. Mild thoracic spine degenerative changes again
noted.
IMPRESSION: No active cardiopulmonary disease.

## 2017-05-24 IMAGING — CR DG CHEST 2V
2 series · 2 of 2 positions shown · non-contrast
Comparison: 08/08/2015, 04/16/2015.  CT 12/26/2011 P

CLINICAL DATA: Pacemaker.

EXAM:
CHEST  2 VIEW

[chest pa]
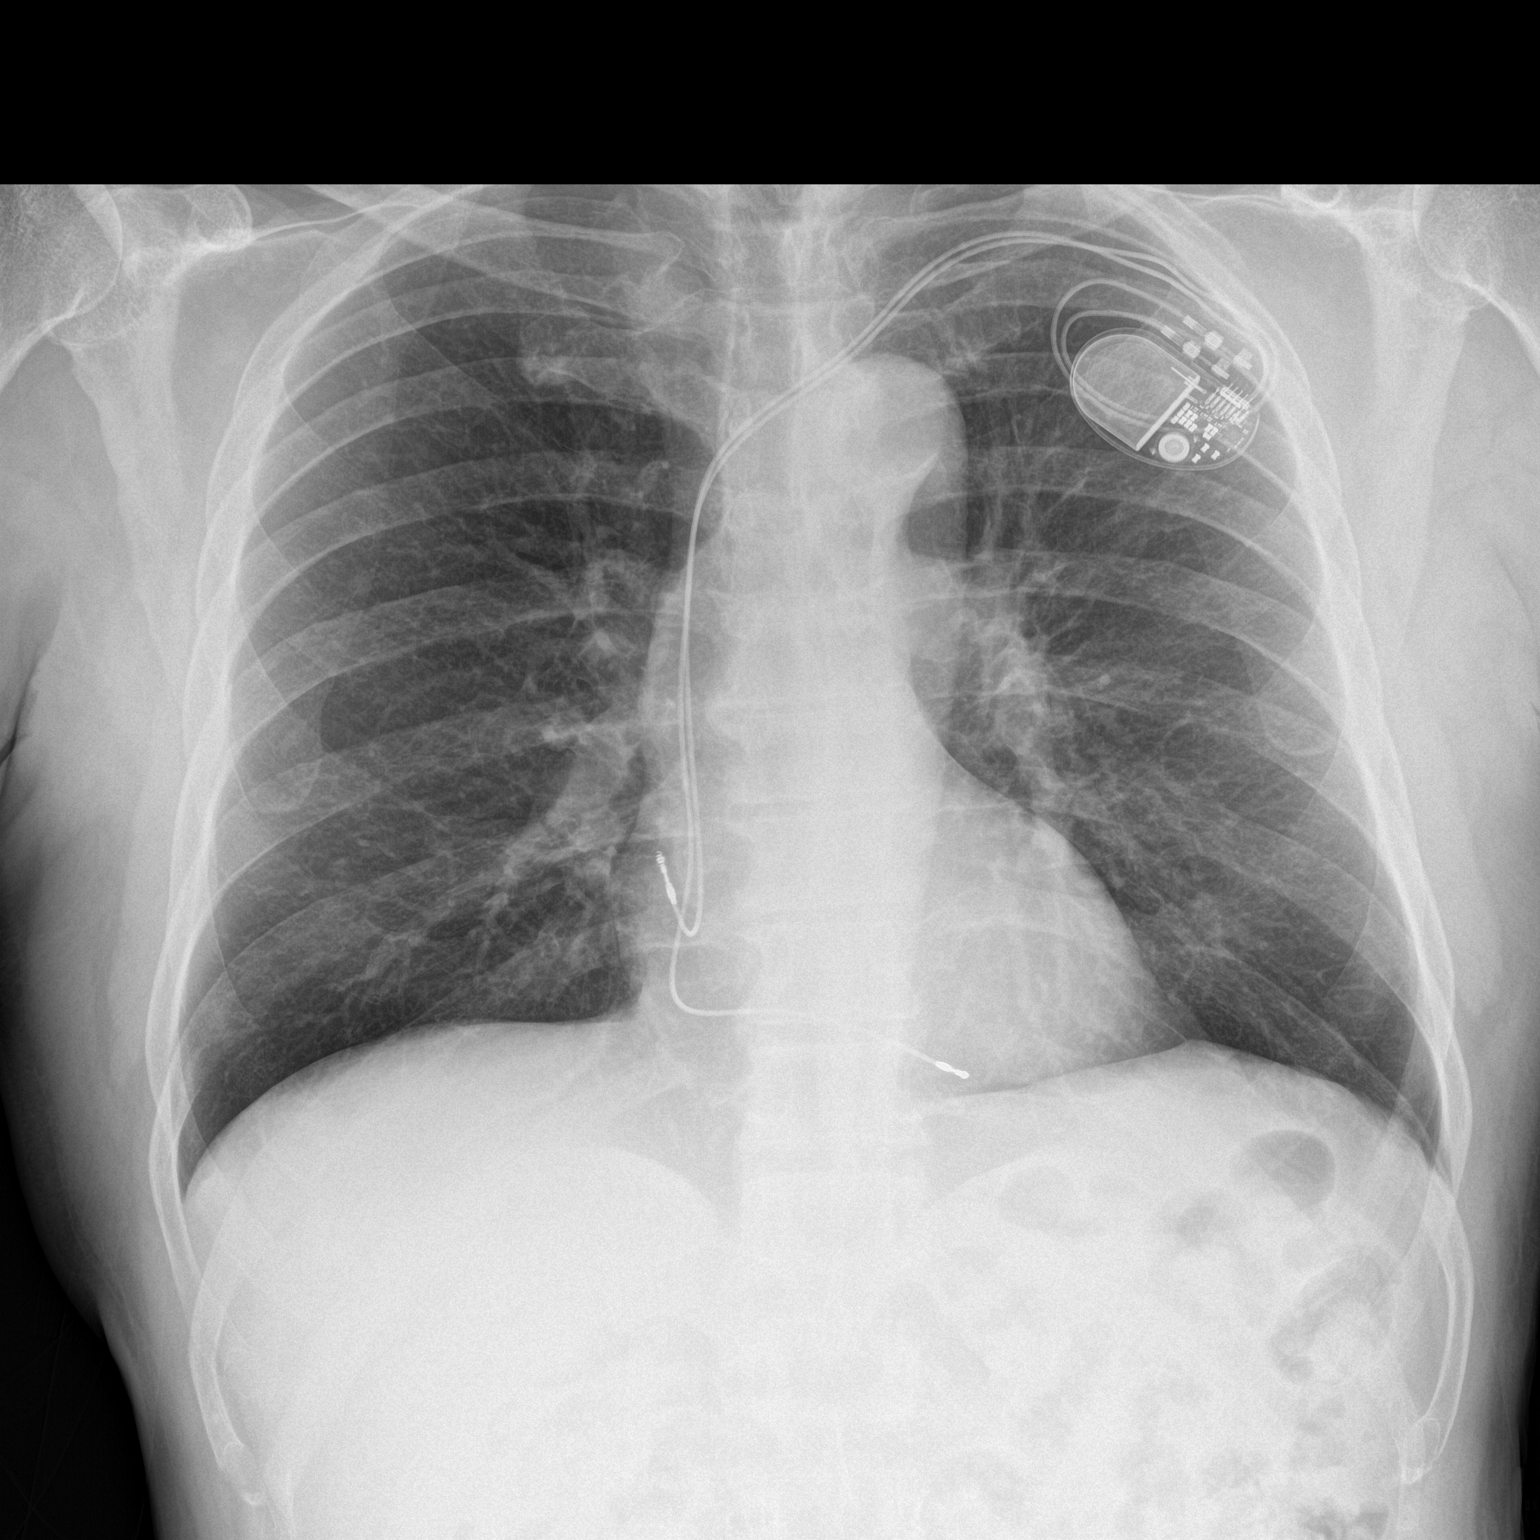

[chest lat]
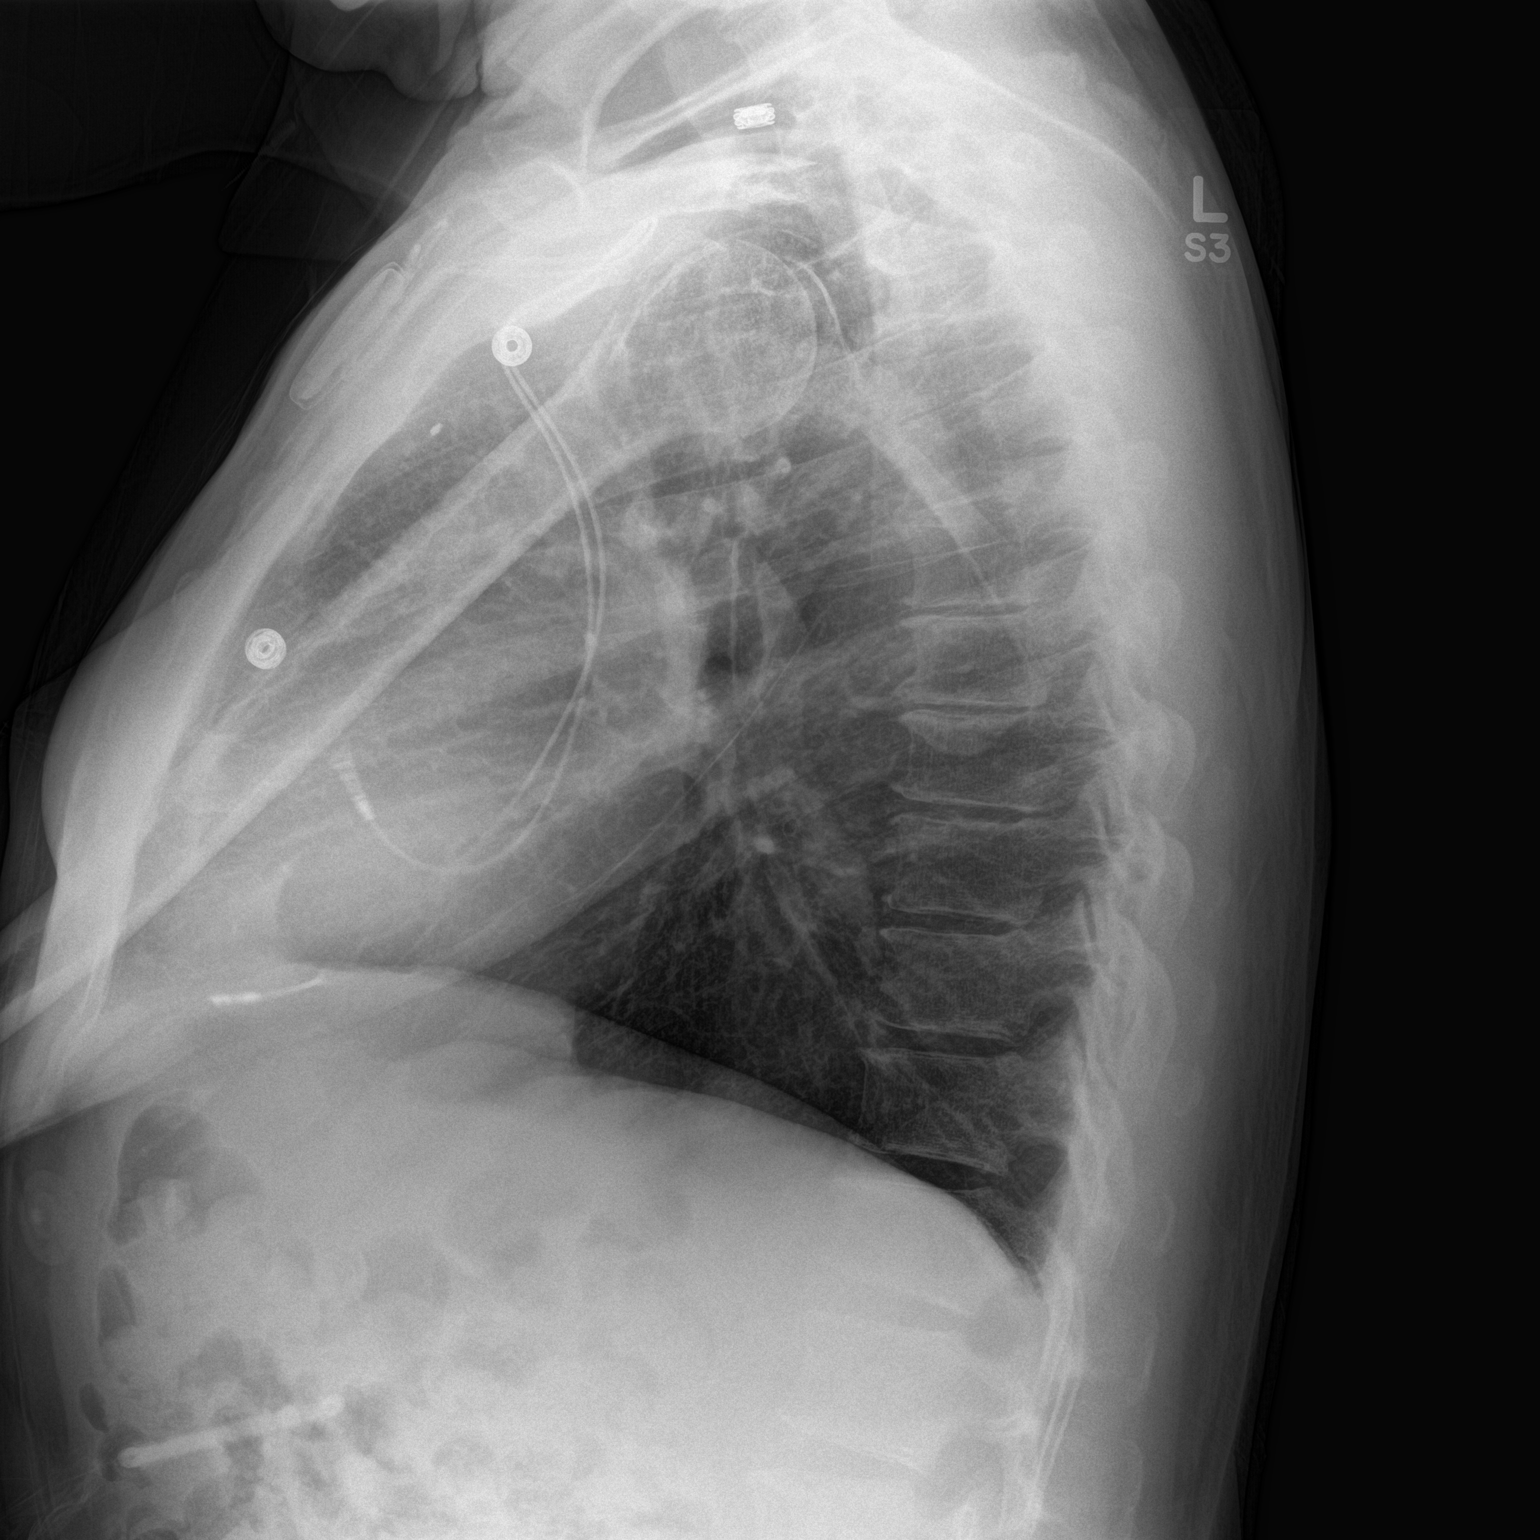

[2 of 2 positions shown; findings below may reference images not displayed]

FINDINGS: Mediastinum and hilar structures are normal. Cardiac pacer with lead
tips in right atrium right ventricle. No complicating features .
Heart size normal. Lungs are clear of acute infiltrates. No pleural
effusion or pneumothorax. Questionable faint nodular opacities noted
over the right upper lobe and left lower lobe. Nodular opacity over
the left lower lobe may represent overlying nipple shadow. Given the
presence of pulmonary nodules on CT of 12/26/2011 for which
follow-up was suggested, it is recommended that nonemergent
nonenhanced chest CT be obtained for further evaluation . No pleural
effusion or pneumothorax. No acute bony abnormality. What appears to
be a catheter is projected over the anterior abdomen on lateral
view.
IMPRESSION: 1. Questionable nodular opacity right upper lobe and left lower
lobe. Left lower lobe nodular opacity may represent overlapping
nipple shadow. Given the presence of pulmonary nodules on CT
12/26/2011 for which follow-up was suggested, it is recommended that
nonemergent nonenhanced chest CT be be obtained for further
evaluation.
2. No acute cardiopulmonary disease. Interim placement of cardiac
pacer. Pacer leads are in the right atrium and right ventricle. No
complicating features.

## 2017-06-05 NOTE — Progress Notes (Signed)
[place] at [facility] [address] [place], [place] [address] [phone]  Date:  06/07/2017   Name:  Michael Offerdahl Sr.   DOB:  1946/04/30   MRN:  [id number]  PCP:  Darreld Mclean, MD    Chief Complaint: Follow-up (Pt here for diabetes f/u visit. Due for A1C today. )   History of Present Illness:  Michael Lamere Sr. is a 71 y.o. very pleasant male patient who presents with the following:  Here today for a followup visit  Last seen by myself in December to follow-up on his DM.  We had to add back his lantus in September of 2017 due to high A1c.  His A1c had improved to the 8s per his report at our last visit in December Lab Results  Component Value Date   HGBA1C 9.1 (H) 08/24/2016   He is monitored by Dr. Marin Olp as follows, per his most recent note  Principle Diagnosis:   IgG kappa smoldering myeloma  Iron deficiency anemia  Current Therapy:         Observation                             His glucose is running approx 120- 130 fasting, and up to about 170 in the afternoon.   He is on 6 units of lantus in addition to his oral meds right now A couple of weeks ago he drank a bunch of coffee with chicory in it and this may have lowered his glucose.  Other than this he has not had low glucose readings Metformin BID, glipizide   He is keeping very busy. He gets lot of physical activity for someone his age  He is splitting his losartan HCT in two and taking it BID He saw Dr. Lovena Le last in October to check on his pacemaker  Gaberiel also mentions that he seems to have a bony bump on his left sternum- he is not sure if this is something to be concerned about.  He has noted this for the last approx 4 years, it is not painful Went back to look at previous CXR and found report from 2016  08/10/15 CHEST  2 VIEW  COMPARISON:  08/08/2015, 04/16/2015.  CT 12/26/2011 P  FINDINGS: Mediastinum and hilar structures are normal.  Cardiac pacer with lead tips in right atrium right ventricle. No complicating features . Heart size normal. Lungs are clear of acute infiltrates. No pleural effusion or pneumothorax. Questionable faint nodular opacities noted over the right upper lobe and left lower lobe. Nodular opacity over the left lower lobe may represent overlying nipple shadow. Given the presence of pulmonary nodules on CT of 12/26/2011 for which follow-up was suggested, it is recommended that nonemergent nonenhanced chest CT be obtained for further evaluation . No pleural effusion or pneumothorax. No acute bony abnormality. What appears to be a catheter is projected over the anterior abdomen on lateral view.  IMPRESSION: 1. Questionable nodular opacity right upper lobe and left lower lobe. Left lower lobe nodular opacity may represent overlapping nipple shadow. Given the presence of pulmonary nodules on CT 12/26/2011 for which follow-up was suggested, it is recommended that nonemergent nonenhanced chest CT be be obtained for further evaluation. 2. No acute cardiopulmonary disease. Interim placement of cardiac pacer. Pacer leads are in the right atrium and right ventricle. No complicating features.  Discussed with pt- he did not  have any follow-up imaging as far as he knows, and I do not see any CT in Epic.    BP Readings from Last 3 Encounters:  06/07/17 136/81  04/03/17 (!) 168/97  11/23/16 (!) 165/102   Wt Readings from Last 3 Encounters:  06/07/17 221 lb 12.8 oz (100.6 kg)  04/03/17 228 lb 6.4 oz (103.6 kg)  11/23/16 223 lb (101.2 kg)     Patient Active Problem List   Diagnosis Date Noted  . Pacemaker 12/08/2015  . Mobitz type 2 second degree heart block 08/08/2015  . Right bundle branch block 12/16/2014  . Syncope 12/16/2014  . Proteinuria due to type 2 diabetes mellitus ([place]) 11/12/2014  . Encounter for therapeutic drug monitoring 03/13/2013  . Multiple myeloma ([place]) 02/15/2013  . Edema  01/07/2013  . Diabetes mellitus due to underlying condition with other diabetic kidney complication ([place])   . Hyperlipidemia   . Hypertension   . MGUS (monoclonal gammopathy of unknown significance)   . GERD (gastroesophageal reflux disease)     Past Medical History:  Diagnosis Date  . Anemia   . Bundle branch block, right   . Cataract   . Diabetes mellitus   . GERD (gastroesophageal reflux disease)   . Hyperlipidemia   . Hypertension   . MGUS (monoclonal gammopathy of unknown significance)   . Multiple myeloma ([place]) 02/2013   normal cytogenetics and FISH panel on 03/11/2013.   . Osteoarthritis   . Right bundle branch block   . Syncope     Past Surgical History:  Procedure Laterality Date  . EP IMPLANTABLE DEVICE N/A 08/09/2015   Procedure: Pacemaker Implant;  Surgeon: [name], MD;  Location: [facility];  Service: Cardiovascular;  Laterality: N/A;  . EYE SURGERY      Social History  Substance Use Topics  . Smoking status: Former Smoker    Packs/day: 2.00    Years: 35.00    Types: Cigarettes    Start date: 03/26/1963    Quit date: 12/18/1997  . Smokeless tobacco: Never Used     Comment: quit 15 years  . Alcohol use No    Family History  Problem Relation Age of Onset  . Hypertension Mother   . Cancer Mother   . Stroke Mother   . Hypertension Sister   . Hypertension Brother   . Hypertension Maternal Grandmother     Allergies  Allergen Reactions  . Amlodipine Swelling  . Crestor [Rosuvastatin] Other (See Comments)    Per pt unable to focus    Medication list has been reviewed and updated.  Current Outpatient Prescriptions on File Prior to Visit  Medication Sig Dispense Refill  . B-D UF III MINI PEN NEEDLES 31G X 5 MM MISC USE AS DIRECTED EVERY DAY 100 each 0  . diphenhydramine-acetaminophen (TYLENOL PM) 25-500 MG TABS Take 1 tablet by mouth at bedtime as needed (sleep). Reported on 12/08/2015    . ferrous sulfate 325 (65 FE) MG tablet Take 325  mg by mouth every other day.    Marland Kitchen glipiZIDE (GLUCOTROL XL) 5 MG 24 hr tablet TAKE 1 TABLET BY MOUTH TWICE A DAY 60 tablet 0  . Glucosamine-Chondroitin-MSM (TRIPLE FLEX PO) Take 1 tablet by mouth daily.     . Insulin Glargine (LANTUS SOLOSTAR) 100 UNIT/ML Solostar Pen Inject 6- 10 units subque daily. May adjust as needed 5 pen 3  . Lancets MISC Use as instructed twice a day 100 each 2  . latanoprost (XALATAN) 0.005 %  ophthalmic solution     . losartan-hydrochlorothiazide (HYZAAR) 100-12.5 MG tablet Take 1 tablet by mouth daily. (Patient taking differently: Take one half tablet twice daily.) 90 tablet 3  . metFORMIN (GLUCOPHAGE) 1000 MG tablet TAKE 1 TABLET BY MOUTH TWICE A DAY WITH MEALS. 180 tablet 3  . Multiple Vitamin (MULTIVITAMIN WITH MINERALS) TABS Take 1 tablet by mouth daily.     . Omega-3 Fatty Acids (FISH OIL PO) Take 1 capsule by mouth every other day.    . ONE TOUCH ULTRA TEST test strip USE AS INSTRUCTED TWICE A DAY 100 each 3  . carvedilol (COREG) 25 MG tablet Take 1 tablet (25 mg total) by mouth 2 (two) times daily. 180 tablet 3   No current facility-administered medications on file prior to visit.     Review of Systems:  As per HPI- otherwise negative. No fever, chills, CP, SOB, rash   Physical Examination: Vitals:   06/07/17 1315  BP: 136/81  Pulse: 64  Temp: 97.8 F (36.6 C)   Vitals:   06/07/17 1315  Weight: 221 lb 12.8 oz (100.6 kg)  Height: _0  (1.905 m)   Body mass index is 27.72 kg/m. Ideal Body Weight: Weight in (lb) to have BMI = 25: 199.6  GEN: WDWN, NAD, Non-toxic, A & O x 3 HEENT: Atraumatic, Normocephalic. Neck supple. No masses, No LAD. Ears and Nose: No external deformity. CV: RRR, No M/G/R. No JVD. No thrill. No extra heart sounds. PULM: CTA B, no wheezes, crackles, rhonchi. No retractions. No resp. distress. No accessory muscle use. ABD: S, NT, ND, +BS. No rebound. No HSM. EXTR: No c/c/e NEURO Normal gait.  PSYCH: Normally interactive.  Conversant. Not depressed or anxious appearing.  Calm demeanor.  Looks very well, fit and healthy He does have a bony prominence at the left costochondral border- likely a prominent rib Assessment and Plan: Uncontrolled type 2 diabetes mellitus without complication, with long-term current use of insulin (HCC) - Plan: Hemoglobin A1c  Lung nodule seen on imaging study - Plan: DG Chest 2 View  Essential hypertension  Cardiac pacemaker in situ  Repeat A1c today.  He reports better control of his blood sugar at home BP looks fine today Follow-up on chest film as above with a repeat film today- will plan for CT if needed  Signed Lamar Blinks, MD  Results for orders placed or performed in visit on 06/07/17  Hemoglobin A1c  Result Value Ref Range   Hgb A1c MFr Bld 8.0 (H) 4.6 - 6.5 %   Dg Chest 2 View  Result Date: 06/07/2017 CLINICAL DATA:  Follow-up from chest x-ray of August 2016 where a questionable nodule was noted in the right upper lobe and in the left lower lobe. EXAM: CHEST  2 VIEW COMPARISON:  PA and lateral chest x-ray of August 10, 2015 FINDINGS: The lungs are adequately inflated. There are 2 nodules measuring up to 3 mm in diameter in the right mid upper lung. The nodule demonstrated previously in the left lower lung is not evident today. The heart and pulmonary vascularity are normal. There is no pleural effusion there is calcification in the wall of the aortic arch. The ICD is in stable position. There is mild degenerative disc disease of the thoracic spine. IMPRESSION: Tiny nodules in the right lung. No definite left lung nodules. Given the history of smoking, chest CT scanning would be a useful next imaging step to evaluate these nodules more completely. Thoracic aortic atherosclerosis Electronically Signed  By: David  Martinique M.D.   On: 06/07/2017 14:31   A1c is much better- was 10 last year, now 8.  Showing improvement with lantus.

## 2017-06-07 ENCOUNTER — Ambulatory Visit (HOSPITAL_BASED_OUTPATIENT_CLINIC_OR_DEPARTMENT_OTHER)
Admission: RE | Admit: 2017-06-07 | Discharge: 2017-06-07 | Disposition: A | Discharge status: Home / Self Care | Payer: Medicare Other | Source: Ambulatory Visit | Attending: Family Medicine | Admitting: Family Medicine

## 2017-06-07 ENCOUNTER — Encounter: Payer: Self-pay | Admitting: Family Medicine

## 2017-06-07 ENCOUNTER — Ambulatory Visit (INDEPENDENT_AMBULATORY_CARE_PROVIDER_SITE_OTHER): Payer: Medicare Other | Admitting: Family Medicine

## 2017-06-07 VITALS — BP 136/81 | HR 64 | Temp 97.8°F | Ht 75.0 in | Wt 221.8 lb

## 2017-06-07 DIAGNOSIS — I700 Atherosclerosis of aorta: Secondary | ICD-10-CM | POA: Insufficient documentation

## 2017-06-07 DIAGNOSIS — I10 Essential (primary) hypertension: Secondary | ICD-10-CM | POA: Diagnosis not present

## 2017-06-07 DIAGNOSIS — R911 Solitary pulmonary nodule: Secondary | ICD-10-CM

## 2017-06-07 DIAGNOSIS — Z950 Presence of cardiac pacemaker: Secondary | ICD-10-CM | POA: Diagnosis not present

## 2017-06-07 DIAGNOSIS — Z794 Long term (current) use of insulin: Secondary | ICD-10-CM

## 2017-06-07 DIAGNOSIS — IMO0001 Reserved for inherently not codable concepts without codable children: Secondary | ICD-10-CM

## 2017-06-07 DIAGNOSIS — R918 Other nonspecific abnormal finding of lung field: Secondary | ICD-10-CM | POA: Diagnosis not present

## 2017-06-07 DIAGNOSIS — E1165 Type 2 diabetes mellitus with hyperglycemia: Secondary | ICD-10-CM | POA: Diagnosis not present

## 2017-06-07 LAB — HEMOGLOBIN A1C: Hgb A1c MFr Bld: 8 % — ABNORMAL HIGH (ref 4.6–6.5)

## 2017-06-07 NOTE — Patient Instructions (Addendum)
It was good to see you today! Take care and I will be in touch with your A1c asap  Your blood pressure looks great I will get a repeat chest x-ray for you today- I'll be in touch with this report as well

## 2017-06-08 ENCOUNTER — Encounter: Payer: Self-pay | Admitting: Family Medicine

## 2017-06-10 ENCOUNTER — Other Ambulatory Visit: Payer: Self-pay | Admitting: Family Medicine

## 2017-06-10 DIAGNOSIS — Z794 Long term (current) use of insulin: Principal | ICD-10-CM

## 2017-06-10 DIAGNOSIS — E1165 Type 2 diabetes mellitus with hyperglycemia: Principal | ICD-10-CM

## 2017-06-10 DIAGNOSIS — IMO0001 Reserved for inherently not codable concepts without codable children: Secondary | ICD-10-CM

## 2017-06-15 ENCOUNTER — Telehealth: Payer: Self-pay | Admitting: Family Medicine

## 2017-06-15 ENCOUNTER — Telehealth: Payer: Self-pay | Admitting: Emergency Medicine

## 2017-06-15 ENCOUNTER — Other Ambulatory Visit: Payer: Self-pay | Admitting: Family Medicine

## 2017-06-15 DIAGNOSIS — R911 Solitary pulmonary nodule: Secondary | ICD-10-CM

## 2017-06-15 NOTE — Telephone Encounter (Signed)
Error

## 2017-06-15 NOTE — Telephone Encounter (Signed)
Called and spoke with Michael Wiggins- his recnet plain chest x-ray mentions doing a CT for follow-up. He would like to go ahead and have this done. I will schedule for him   Tiny nodules in the right lung. No definite left lung nodules. Given the history of smoking, chest CT scanning would be a useful next imaging step to evaluate these nodules more completely

## 2017-06-15 NOTE — Telephone Encounter (Signed)
Pt called to get Imaging and lab results from 06/07/17. Discussed labs and provider recommendations with pt.  Pt would like to know the results of Chest x-ray. Please advise.

## 2017-06-16 ENCOUNTER — Ambulatory Visit (HOSPITAL_BASED_OUTPATIENT_CLINIC_OR_DEPARTMENT_OTHER)
Admission: RE | Admit: 2017-06-16 | Discharge: 2017-06-16 | Disposition: A | Discharge status: Home / Self Care | Payer: Medicare Other | Source: Ambulatory Visit | Attending: Family Medicine | Admitting: Family Medicine

## 2017-06-16 DIAGNOSIS — R911 Solitary pulmonary nodule: Secondary | ICD-10-CM | POA: Diagnosis not present

## 2017-06-17 ENCOUNTER — Encounter: Payer: Self-pay | Admitting: Family Medicine

## 2017-06-17 ENCOUNTER — Telehealth: Payer: Self-pay | Admitting: Family Medicine

## 2017-06-17 DIAGNOSIS — R911 Solitary pulmonary nodule: Secondary | ICD-10-CM

## 2017-06-17 MED ORDER — GLIPIZIDE ER 5 MG PO TB24: 5.0000 mg | ORAL_TABLET | Freq: Two times a day (BID) | ORAL | 3 refills | Status: DC

## 2017-06-17 NOTE — Telephone Encounter (Signed)
Called and discussed CT with pt.  Recommend follow-up in 6-12 months. I will go ahead and order CT for 1 year from now Pt states understanding- I will also mail him a copy of his CT report

## 2017-07-06 ENCOUNTER — Other Ambulatory Visit: Payer: Self-pay | Admitting: Family Medicine

## 2017-07-10 ENCOUNTER — Ambulatory Visit (INDEPENDENT_AMBULATORY_CARE_PROVIDER_SITE_OTHER): Payer: Medicare Other | Admitting: *Deleted

## 2017-07-10 DIAGNOSIS — I441 Atrioventricular block, second degree: Secondary | ICD-10-CM

## 2017-07-11 ENCOUNTER — Other Ambulatory Visit: Payer: Self-pay | Admitting: Family Medicine

## 2017-07-11 NOTE — Progress Notes (Signed)
Remote pacemaker transmission.   

## 2017-07-12 ENCOUNTER — Encounter: Payer: Self-pay | Admitting: Cardiology

## 2017-07-26 ENCOUNTER — Encounter: Payer: Self-pay | Admitting: Cardiology

## 2017-08-09 LAB — CUP PACEART REMOTE DEVICE CHECK
Battery Remaining Longevity: 127 mo
Battery Remaining Percentage: 95.5 %
Battery Voltage: 2.99 V
Brady Statistic AP VP Percent: 6.9 %
Brady Statistic AP VS Percent: 1 %
Brady Statistic AS VP Percent: 39 %
Brady Statistic AS VS Percent: 53 %
Brady Statistic RA Percent Paced: 7.3 %
Brady Statistic RV Percent Paced: 46 %
Date Time Interrogation Session: 20180724060014
Implantable Lead Implant Date: 20160822
Implantable Lead Implant Date: 20160822
Implantable Lead Location: 753859
Implantable Lead Location: 753860
Implantable Pulse Generator Implant Date: 20160822
Lead Channel Impedance Value: 440 Ohm
Lead Channel Impedance Value: 560 Ohm
Lead Channel Pacing Threshold Amplitude: 1.25 V
Lead Channel Pacing Threshold Amplitude: 1.375 V
Lead Channel Pacing Threshold Pulse Width: 0.4 ms
Lead Channel Pacing Threshold Pulse Width: 0.4 ms
Lead Channel Sensing Intrinsic Amplitude: 12 mV
Lead Channel Sensing Intrinsic Amplitude: 2.2 mV
Lead Channel Setting Pacing Amplitude: 1.625
Lead Channel Setting Pacing Amplitude: 2.25 V
Lead Channel Setting Pacing Pulse Width: 0.4 ms
Lead Channel Setting Sensing Sensitivity: 2 mV
Pulse Gen Model: 2240
Pulse Gen Serial Number: 7799340

## 2017-08-31 DIAGNOSIS — H2513 Age-related nuclear cataract, bilateral: Secondary | ICD-10-CM | POA: Diagnosis not present

## 2017-09-06 ENCOUNTER — Telehealth: Payer: Self-pay | Admitting: Family Medicine

## 2017-09-06 NOTE — Telephone Encounter (Signed)
Pt called in to make pcp aware that he had labs completed at the [facility] and his LDL is 197.   Pt would like to be advised further

## 2017-09-07 ENCOUNTER — Encounter: Payer: Self-pay | Admitting: Family Medicine

## 2017-09-07 NOTE — Telephone Encounter (Signed)
Called and [facility]- will send him a mychart message, please read

## 2017-09-11 NOTE — Telephone Encounter (Signed)
-----   Message from [name], MD sent at 09/11/2017  8:09 AM EDT ----- Yes and Yes. I will refer him to our clinic. Meghan Supple is our Pharm D who runs that clinic. Thanks. GT ----- Message ----- From: Darreld Mclean, MD Sent: 09/07/2017   1:25 PM To: [name], MD  Salley Scarlet Carleene Overlie- quick question about Mr. Rosanna Randy.  I think you are seeing him next month for a pacer check.  He has not been able to tolerate statins, and his cholesterol is way above goal.  Would this be a patient who might benefit from something like Repatha?  If so, is there a lipid clinic I refer him to?  Thanks so much JC

## 2017-09-26 ENCOUNTER — Ambulatory Visit (INDEPENDENT_AMBULATORY_CARE_PROVIDER_SITE_OTHER): Payer: Medicare Other | Admitting: Family Medicine

## 2017-09-26 ENCOUNTER — Encounter: Payer: Self-pay | Admitting: Family Medicine

## 2017-09-26 ENCOUNTER — Telehealth: Payer: Self-pay | Admitting: Family Medicine

## 2017-09-26 VITALS — BP 138/88 | HR 71 | Temp 98.6°F | Ht 75.0 in | Wt 224.1 lb

## 2017-09-26 DIAGNOSIS — J209 Acute bronchitis, unspecified: Secondary | ICD-10-CM | POA: Diagnosis not present

## 2017-09-26 MED ORDER — ALBUTEROL SULFATE 108 (90 BASE) MCG/ACT IN AEPB: 2.0000 | INHALATION_SPRAY | Freq: Four times a day (QID) | RESPIRATORY_TRACT | 0 refills | Status: DC | PRN

## 2017-09-26 NOTE — Telephone Encounter (Signed)
Patient Name: Michael Wiggins  DOB: 14-May-1946    Initial Comment caller is having shortness of breath an needs an appointment    Nurse Assessment  Nurse: Julien Girt, RN, Almyra Free Date/Time Eilene Ghazi Time): 09/26/2017 12:42:00 PM  Confirm and document reason for call. If symptomatic, describe symptoms. ---Caller states he walks everyday and the last month it has caused him to be sob. He has also been sob around the house with exertion. He is waking up wheezing during the night. No chest pain.  Does the patient have any new or worsening symptoms? ---Yes  Will a triage be completed? ---Yes  Related visit to physician within the last 2 weeks? ---No  Does the PT have any chronic conditions? (i.e. diabetes, asthma, etc.) ---Yes  List chronic conditions. ---Mobitz Heart Block, Pacemaker, Diabetes, Htn, High Cholesterol , GERD, Multiple Myeloma  Is this a behavioral health or substance abuse call? ---No     Guidelines    Guideline Title Affirmed Question Affirmed Notes  Breathing Difficulty [1] MILD difficulty breathing (e.g., minimal/no SOB at rest, SOB with walking, pulse <100) AND [2] NEW-onset or WORSE than normal    Final Disposition User   See Physician within 4 Hours (or PCP triage) Julien Girt, RN, Almyra Free    Referrals  REFERRED TO PCP OFFICE

## 2017-09-26 NOTE — Progress Notes (Signed)
Pre visit review using our clinic review tool, if applicable. No additional management support is needed unless otherwise documented below in the visit note. 

## 2017-09-26 NOTE — Patient Instructions (Signed)
Continue to push fluids, practice good hand hygiene, and cover your mouth if you cough.  If you start having fevers, shaking or shortness of breath, seek immediate care.  Use the inhaler 10 minutes before walking or when you are wheezing.   If anything changes, let us know.

## 2017-09-26 NOTE — Progress Notes (Signed)
Chief Complaint  Patient presents with  . Shortness of Breath  . Wheezing  . Cough    Michael Mons Sr. here for URI complaints.  Duration: 3 days  Associated symptoms: sore throat, wheezing, shortness of breath on exertion and cough Denies: sinus pain, itchy watery eyes, ear pain, ear drainage, chest pain, fevers and myalgia Treatment to date: None Sick contacts: Yes- wife has URI symptoms  ROS:  Const: Denies fevers HEENT: As noted in HPI Lungs: +SOB  Past Medical History:  Diagnosis Date  . Anemia   . Cataract   . Diabetes mellitus   . GERD (gastroesophageal reflux disease)   . Hyperlipidemia   . Hypertension   . MGUS (monoclonal gammopathy of unknown significance)   . Multiple myeloma ([place]) 02/2013   normal cytogenetics and FISH panel on 03/11/2013.   . Osteoarthritis   . Right bundle branch block   . Syncope    Family History  Problem Relation Age of Onset  . Hypertension Mother   . Cancer Mother   . Stroke Mother   . Hypertension Sister   . Hypertension Brother   . Hypertension Maternal Grandmother     BP 138/88 (BP Location: Left Arm, Patient Position: Sitting, Cuff Size: Normal)   Pulse 71   Temp 98.6 F (37 C) (Oral)   Ht _0  (1.905 m)   Wt 224 lb 2 oz (101.7 kg)   SpO2 97%   BMI 28.01 kg/m  General: Awake, alert, appears stated age HEENT: AT, [place], ears patent b/l and TM's neg, nares patent w/o discharge, pharynx pink and without exudates, MMM Neck: No masses or asymmetry Heart: RRR, +SEM heard loudest at aortic listening post; no bruits Lungs: CTAB, no accessory muscle use Psych: Age appropriate judgment and insight, normal mood and affect  Acute bronchitis, unspecified organism - Plan: Albuterol Sulfate 108 (90 Base) MCG/ACT AEPB  Orders as above. Trial albuterol during episodes of wheezing and 10 min before walking.  Low risk myocardial perfusion scan noted from cardiology team.  Continue to push fluids, practice good hand hygiene, cover  mouth when coughing. F/u prn. If starting to experience fevers, shaking, or shortness of breath, seek immediate care. Pt voiced understanding and agreement to the plan.  [place], DO 09/26/17 3:45 PM

## 2017-10-03 ENCOUNTER — Ambulatory Visit (HOSPITAL_BASED_OUTPATIENT_CLINIC_OR_DEPARTMENT_OTHER): Payer: Medicare Other | Admitting: Hematology & Oncology

## 2017-10-03 ENCOUNTER — Other Ambulatory Visit (HOSPITAL_BASED_OUTPATIENT_CLINIC_OR_DEPARTMENT_OTHER): Payer: Medicare Other

## 2017-10-03 ENCOUNTER — Telehealth: Payer: Self-pay | Admitting: *Deleted

## 2017-10-03 VITALS — BP 145/90 | HR 72 | Temp 97.9°F | Resp 16 | Wt 223.0 lb

## 2017-10-03 DIAGNOSIS — D500 Iron deficiency anemia secondary to blood loss (chronic): Secondary | ICD-10-CM | POA: Diagnosis not present

## 2017-10-03 DIAGNOSIS — C9000 Multiple myeloma not having achieved remission: Secondary | ICD-10-CM | POA: Diagnosis not present

## 2017-10-03 DIAGNOSIS — E0829 Diabetes mellitus due to underlying condition with other diabetic kidney complication: Secondary | ICD-10-CM

## 2017-10-03 DIAGNOSIS — C9001 Multiple myeloma in remission: Secondary | ICD-10-CM

## 2017-10-03 DIAGNOSIS — D509 Iron deficiency anemia, unspecified: Secondary | ICD-10-CM

## 2017-10-03 LAB — CMP (CANCER CENTER ONLY)
ALT(SGPT): 34 U/L (ref 10–47)
AST: 28 U/L (ref 11–38)
Albumin: 2.7 g/dL — ABNORMAL LOW (ref 3.3–5.5)
Alkaline Phosphatase: 51 U/L (ref 26–84)
BUN, Bld: 13 mg/dL (ref 7–22)
CALCIUM: 9.3 mg/dL (ref 8.0–10.3)
CO2: 28 meq/L (ref 18–33)
Chloride: 103 mEq/L (ref 98–108)
Creat: 0.9 mg/dl (ref 0.6–1.2)
GLUCOSE: 190 mg/dL — AB (ref 73–118)
POTASSIUM: 3.9 meq/L (ref 3.3–4.7)
Sodium: 142 mEq/L (ref 128–145)
Total Bilirubin: 0.5 mg/dl (ref 0.20–1.60)
Total Protein: 7 g/dL (ref 6.4–8.1)

## 2017-10-03 LAB — IRON AND TIBC
%SAT: 17 % — ABNORMAL LOW (ref 20–55)
Iron: 48 ug/dL (ref 42–163)
TIBC: 285 ug/dL (ref 202–409)
UIBC: 238 ug/dL (ref 117–376)

## 2017-10-03 LAB — CBC WITH DIFFERENTIAL (CANCER CENTER ONLY)
BASO#: 0 10*3/uL (ref 0.0–0.2)
BASO%: 0.5 % (ref 0.0–2.0)
EOS%: 5.7 % (ref 0.0–7.0)
Eosinophils Absolute: 0.4 10*3/uL (ref 0.0–0.5)
HEMATOCRIT: 33.6 % — AB (ref 38.7–49.9)
HGB: 11.1 g/dL — ABNORMAL LOW (ref 13.0–17.1)
LYMPH#: 1.8 10*3/uL (ref 0.9–3.3)
LYMPH%: 24.1 % (ref 14.0–48.0)
MCH: 29.2 pg (ref 28.0–33.4)
MCHC: 33 g/dL (ref 32.0–35.9)
MCV: 88 fL (ref 82–98)
MONO#: 0.7 10*3/uL (ref 0.1–0.9)
MONO%: 9.1 % (ref 0.0–13.0)
NEUT#: 4.5 10*3/uL (ref 1.5–6.5)
NEUT%: 60.6 % (ref 40.0–80.0)
Platelets: 260 10*3/uL (ref 145–400)
RBC: 3.8 10*6/uL — ABNORMAL LOW (ref 4.20–5.70)
RDW: 14.5 % (ref 11.1–15.7)
WBC: 7.4 10*3/uL (ref 4.0–10.0)

## 2017-10-03 LAB — FERRITIN: Ferritin: 89 ng/ml (ref 22–316)

## 2017-10-03 NOTE — Progress Notes (Signed)
Patient ID: Michael Gongora Sr.                 DOB: Jul 21, 1946                    MRN: [id number]     HPI: Michael Harrower Sr. is a very pleasant 71 y.o. male patient referred to lipid clinic by Dr Lovena Le. PMH is significant for HLD, DM, HTN, multiple myeloma, MGUS, GERD, and right bundle branch block. He presents to clinic today for lipid management.  Pt reports having his cholesterol checked at the [facility] and his LDL was elevated to 197. We do not have these labs, however our most recent lipid panel from 1 year ago shows elevated LDL > 170. Pt recalls taking Crestor in the past, which caused issues with patient focusing throughout the day. He thinks it is because he took his Crestor in the morning. He has also taken Lipitor in the past, but does not recall having any adverse events with this. He recalls that it worked well to lower his cholesterol, and he thinks it may have been stopped after his cholesterol normalized. He is agreeable to rechallenging with statin therapy.  Pt eats a healthy diet overall other than occasionally eating fried food. He stays active by walking each day. He mentions that his BP has been difficult to control as well. He checks his BP at home and does not recall seeing any readings < 130/80.  Current Medications: none Intolerances: Crestor - unable to focus, Lipitor '10mg'$  and '20mg'$  daily - does not recall adverse events Risk Factors: DM, LDL > 190 LDL goal: '100mg'$ /dL  Diet: Breakfast - oatmeal with fruit or 1 whole egg with egg whites with veggies and cheese, [place] sausage or wheat toast. Lunch - leftovers from breakfast or protein shake. Dinner - fried chicken and fried okra, green beans, brown rice, baked potatoes. Dessert - sugar free cookies or sugar free apple pie. Drinks - 6 bottles of 16 oz of water each day. Cut back on Diet Coke but does drink diet drinks.   Exercise: Walking 45 minutes every morning - brisk walk for 2.5 miles.   Family History: Mother with HTN,  stroke, and cancer. Sister, brother, and maternal grandmother with HTN and 3 strokes.   Social History: Former smoker 2 PPD for 35 years, quit in 1999. Denies alcohol and illicit drug use.  Labs: 08/2016: TC 244, HDL 33.6, TG 190, LDL 172 (no lipid lowering therapy)  Past Medical History:  Diagnosis Date  . Anemia   . Cataract   . Diabetes mellitus   . GERD (gastroesophageal reflux disease)   . Hyperlipidemia   . Hypertension   . MGUS (monoclonal gammopathy of unknown significance)   . Multiple myeloma ([place]) 02/2013   normal cytogenetics and FISH panel on 03/11/2013.   . Osteoarthritis   . Right bundle branch block   . Syncope     Current Outpatient Prescriptions on File Prior to Visit  Medication Sig Dispense Refill  . Albuterol Sulfate 108 (90 Base) MCG/ACT AEPB Inhale 2 puffs into the lungs every 6 (six) hours as needed. Take 2 puffs 10 minutes before walking also. 1 each 0  . B-D UF III MINI PEN NEEDLES 31G X 5 MM MISC USE AS DIRECTED EVERY DAY 100 each 0  . B-D UF III MINI PEN NEEDLES 31G X 5 MM MISC USE AS DIRECTED EVERY DAY 100 each 2  . carvedilol (COREG) 25 MG tablet Take  1 tablet (25 mg total) by mouth 2 (two) times daily. 180 tablet 3  . carvedilol (COREG) 25 MG tablet Take 25 mg by mouth 2 (two) times daily.  3  . diphenhydramine-acetaminophen (TYLENOL PM) 25-500 MG TABS Take 1 tablet by mouth at bedtime as needed (sleep). Reported on 12/08/2015    . ferrous sulfate 325 (65 FE) MG tablet Take 325 mg by mouth every other day.    Marland Kitchen glipiZIDE (GLUCOTROL XL) 5 MG 24 hr tablet Take 1 tablet (5 mg total) by mouth 2 (two) times daily. 180 tablet 3  . Glucosamine-Chondroitin-MSM (TRIPLE FLEX PO) Take 1 tablet by mouth daily.     . Lancets MISC Use as instructed twice a day 100 each 2  . LANTUS SOLOSTAR 100 UNIT/ML Solostar Pen INJECT 6 TO 10 UNITS INTO THE SKIN EVERY DAY (MAY ADJUST AS NEEDED) 15 pen 0  . latanoprost (XALATAN) 0.005 % ophthalmic solution     .  losartan-hydrochlorothiazide (HYZAAR) 100-12.5 MG tablet Take 1 tablet by mouth daily. (Patient taking differently: Take one half tablet twice daily.) 90 tablet 3  . metFORMIN (GLUCOPHAGE) 1000 MG tablet TAKE 1 TABLET BY MOUTH TWICE A DAY WITH MEALS 180 tablet 3  . Multiple Vitamin (MULTIVITAMIN WITH MINERALS) TABS Take 1 tablet by mouth daily.     . Omega-3 Fatty Acids (FISH OIL PO) Take 1 capsule by mouth every other day.    . ONE TOUCH ULTRA TEST test strip USE AS INSTRUCTED TWICE A DAY 100 each 3   No current facility-administered medications on file prior to visit.     Allergies  Allergen Reactions  . Amlodipine Swelling  . Crestor [Rosuvastatin] Other (See Comments)    Per pt unable to focus    Assessment/Plan:  1. Hyperlipidemia - LDL is elevated at '197mg'$ /dL above goal '100mg'$ /dL for primary prevention with DM. Pt recalls only experiencing side effects with Crestor in the past (difficulty focusing). He is agreeable to rechallenging with atorvastatin '40mg'$  daily, which he previously took at a lower dose and tolerated well. Encouraged pt to reduce intake of fried meat as well. Will assess tolerability at next f/u appt and can schedule f/u lipid panel at that time.  2. Hypertension - BP is elevated above goal <130/61mHg today and with patient's reported home BP checks. Will increase losartan-HCTZ to the 100-'25mg'$  daily dose. BMET checked at recent oncology visit was stable. Pt will continue carvedilol '25mg'$  BID. Will reassess at 1 month f/u with BMET at that time. Can consider adding amlodipine if additional BP lowering is required.  F/u in pharmacy clinic in 1 month.  Ava Tangney E. Mahnoor Mathisen, PharmD, CPP, [place] [place] [address]Phone: [phone] Fax: [phone]/18/2018 10:30 AM

## 2017-10-03 NOTE — Telephone Encounter (Signed)
Patient would like to start on iron supplements, but wants to check with Dr Marin Olp first. He also has heard these can cause constipation and wants to know how to handle this.  Spoke with Dr Marin Olp and he is good with patient taking the supplement. He suggests colace daily to help with constipation.  Patient understands instruction. Confirmed with teach back.

## 2017-10-03 NOTE — Progress Notes (Signed)
Hematology and Oncology Follow Up Visit  Michael Wiggins. [id number] 10/10/1946 71 y.o. 10/03/2017   Principle Diagnosis:   IgG kappa smoldering myeloma  Iron deficiency anemia  Current Therapy:    Observation     Interim History:  [name] is back for followup. He looks great. He is still working. He really has no specific complaints. He's had no nausea or vomiting. He does have some chronic pain issues. This is more so from his work. He works in Dentist.  We last saw him back in April, his M spike was 0.8 g/dL. His IgG level was 1400 mg/dL. His Kappa Lightchain was 5.3 mg/dL.  He's had no fever. He's had no bleeding. There's been no change in bowel or bladder habits.  Overall, his blood sugars seem to be doing better. He says his last hemoglobin A1c was 7.  His appetite is good.  He's had no rashes.  Overall, his performance status is ECOG 1   Medications:  Current Outpatient Prescriptions:  .  Albuterol Sulfate 108 (90 Base) MCG/ACT AEPB, Inhale 2 puffs into the lungs every 6 (six) hours as needed. Take 2 puffs 10 minutes before walking also., Disp: 1 each, Rfl: 0 .  B-D UF III MINI PEN NEEDLES 31G X 5 MM MISC, USE AS DIRECTED EVERY DAY, Disp: 100 each, Rfl: 0 .  B-D UF III MINI PEN NEEDLES 31G X 5 MM MISC, USE AS DIRECTED EVERY DAY, Disp: 100 each, Rfl: 2 .  carvedilol (COREG) 25 MG tablet, Take 1 tablet (25 mg total) by mouth 2 (two) times daily., Disp: 180 tablet, Rfl: 3 .  carvedilol (COREG) 25 MG tablet, Take 25 mg by mouth 2 (two) times daily., Disp: , Rfl: 3 .  diphenhydramine-acetaminophen (TYLENOL PM) 25-500 MG TABS, Take 1 tablet by mouth at bedtime as needed (sleep). Reported on 12/08/2015, Disp: , Rfl:  .  ferrous sulfate 325 (65 FE) MG tablet, Take 325 mg by mouth every other day., Disp: , Rfl:  .  glipiZIDE (GLUCOTROL XL) 5 MG 24 hr tablet, Take 1 tablet (5 mg total) by mouth 2 (two) times daily., Disp: 180 tablet, Rfl: 3 .   Glucosamine-Chondroitin-MSM (TRIPLE FLEX PO), Take 1 tablet by mouth daily. , Disp: , Rfl:  .  Lancets MISC, Use as instructed twice a day, Disp: 100 each, Rfl: 2 .  LANTUS SOLOSTAR 100 UNIT/ML Solostar Pen, INJECT 6 TO 10 UNITS INTO THE SKIN EVERY DAY (MAY ADJUST AS NEEDED), Disp: 15 pen, Rfl: 0 .  latanoprost (XALATAN) 0.005 % ophthalmic solution, , Disp: , Rfl:  .  losartan-hydrochlorothiazide (HYZAAR) 100-12.5 MG tablet, Take 1 tablet by mouth daily. (Patient taking differently: Take one half tablet twice daily.), Disp: 90 tablet, Rfl: 3 .  metFORMIN (GLUCOPHAGE) 1000 MG tablet, TAKE 1 TABLET BY MOUTH TWICE A DAY WITH MEALS, Disp: 180 tablet, Rfl: 3 .  Multiple Vitamin (MULTIVITAMIN WITH MINERALS) TABS, Take 1 tablet by mouth daily. , Disp: , Rfl:  .  Omega-3 Fatty Acids (FISH OIL PO), Take 1 capsule by mouth every other day., Disp: , Rfl:  .  ONE TOUCH ULTRA TEST test strip, USE AS INSTRUCTED TWICE A DAY, Disp: 100 each, Rfl: 3  Allergies:  Allergies  Allergen Reactions  . Amlodipine Swelling  . Crestor [Rosuvastatin] Other (See Comments)    Per pt unable to focus    Past Medical History, Surgical history, Social history, and Family History were reviewed and updated.  Review of Systems:  As stated in the interim history  Physical Exam:  weight is 223 lb (101.2 kg). His oral temperature is 97.9 F (36.6 C). His blood pressure is 145/90 (abnormal) and his pulse is 72. His respiration is 16 and oxygen saturation is 100%.   Well-developed and well-nourished African-American male. Head and neck exam shows no ocular or oral lesions. There are no palpable cervical or supraclavicular lymph nodes. Lungs are clear bilaterally. Cardiac exam regular rate and rhythm with no murmurs, rubs or bruits. Abdomen is soft. He has good bowel sounds. There is no fluid wave. There is no palpable liver or spleen tip. Back exam shows no tenderness over the spine, ribs or hips. Extremities shows no clubbing,  cyanosis or edema. Neurological exam shows no focal neurological deficits.   Lab Results  Component Value Date   WBC 7.4 10/03/2017   HGB 11.1 (L) 10/03/2017   HCT 33.6 (L) 10/03/2017   MCV 88 10/03/2017   PLT 260 10/03/2017     Chemistry      Component Value Date/Time   NA 142 10/03/2017 0910   NA 137 04/03/2017 0852   K 3.9 10/03/2017 0910   K 4.2 04/03/2017 0852   CL 103 10/03/2017 0910   CL 108 (H) 05/15/2013 0912   CO2 28 10/03/2017 0910   CO2 26 04/03/2017 0852   BUN 13 10/03/2017 0910   BUN 16.3 04/03/2017 0852   CREATININE 0.9 10/03/2017 0910   CREATININE 1.0 04/03/2017 0852      Component Value Date/Time   CALCIUM 9.3 10/03/2017 0910   CALCIUM 9.0 04/03/2017 0852   ALKPHOS 51 10/03/2017 0910   ALKPHOS 46 04/03/2017 0852   AST 28 10/03/2017 0910   AST 18 04/03/2017 0852   ALT 34 10/03/2017 0910   ALT 19 04/03/2017 0852   BILITOT 0.50 10/03/2017 0910   BILITOT 0.38 04/03/2017 0852         Impression and Plan: [name] is 71 year old gentleman with IgG kappa smoldering myeloma. So far, I do not see any evidence of progressive disease.   We will see what his myeloma numbers look like today. I would be surprised if there is much change.  Again, he is totally asymptomatic.  We will plan to get him back to see [place] in another 6 months.  Volanda Napoleon, MD 10/17/201812:25 PM

## 2017-10-04 ENCOUNTER — Ambulatory Visit (INDEPENDENT_AMBULATORY_CARE_PROVIDER_SITE_OTHER): Payer: Medicare Other | Admitting: Pharmacist

## 2017-10-04 VITALS — BP 148/84 | HR 70

## 2017-10-04 DIAGNOSIS — I10 Essential (primary) hypertension: Secondary | ICD-10-CM

## 2017-10-04 DIAGNOSIS — E782 Mixed hyperlipidemia: Secondary | ICD-10-CM

## 2017-10-04 LAB — IGG, IGA, IGM
IGA/IMMUNOGLOBULIN A, SERUM: 293 mg/dL (ref 61–437)
IGM (IMMUNOGLOBIN M), SRM: 22 mg/dL (ref 15–143)

## 2017-10-04 LAB — KAPPA/LAMBDA LIGHT CHAINS
IG LAMBDA FREE LIGHT CHAIN: 22.3 mg/L (ref 5.7–26.3)
Ig Kappa Free Light Chain: 61.2 mg/L — ABNORMAL HIGH (ref 3.3–19.4)
Kappa/Lambda FluidC Ratio: 2.74 — ABNORMAL HIGH (ref 0.26–1.65)

## 2017-10-04 MED ORDER — ATORVASTATIN CALCIUM 40 MG PO TABS: 40.0000 mg | ORAL_TABLET | Freq: Every day | ORAL | 11 refills | Status: DC

## 2017-10-04 MED ORDER — LOSARTAN POTASSIUM-HCTZ 100-25 MG PO TABS: 1.0000 | ORAL_TABLET | Freq: Every day | ORAL | 3 refills | Status: DC

## 2017-10-04 NOTE — Patient Instructions (Addendum)
It was nice to see you today  Cholesterol: Start taking atorvastatin 40mg  daily in the evening. Call Megan in clinic if you develop any side effects, we can decrease the dose if this happens  Try to cut back on fried   Blood pressure: Increase your losartan-HCTZ dose to the 100-25mg  strength. I sent a new prescription in to your pharmacy to take 1 tablet daily  Continue taking your other medications and check your blood pressure at home   Follow up in clinic in 1 month

## 2017-10-08 LAB — PROTEIN ELECTROPHORESIS, SERUM, WITH REFLEX
A/G RATIO SPE: 0.7 (ref 0.7–1.7)
ALPHA 1: 0.2 g/dL (ref 0.0–0.4)
Albumin: 2.7 g/dL — ABNORMAL LOW (ref 2.9–4.4)
Alpha 2: 1 g/dL (ref 0.4–1.0)
BETA: 1.1 g/dL (ref 0.7–1.3)
GAMMA GLOBULIN: 1.4 g/dL (ref 0.4–1.8)
Globulin, Total: 3.7 g/dL (ref 2.2–3.9)
Interpretation(See Below): 0
M-SPIKE, %: 0.9 g/dL — AB
TOTAL PROTEIN: 6.4 g/dL (ref 6.0–8.5)

## 2017-10-09 ENCOUNTER — Other Ambulatory Visit: Payer: Self-pay | Admitting: Internal Medicine

## 2017-10-09 ENCOUNTER — Ambulatory Visit (INDEPENDENT_AMBULATORY_CARE_PROVIDER_SITE_OTHER): Payer: Medicare Other | Admitting: *Deleted

## 2017-10-09 DIAGNOSIS — I441 Atrioventricular block, second degree: Secondary | ICD-10-CM

## 2017-10-10 NOTE — Progress Notes (Signed)
Remote pacemaker transmission.   

## 2017-10-11 LAB — CUP PACEART REMOTE DEVICE CHECK
Battery Remaining Longevity: 124 mo
Battery Remaining Percentage: 95.5 %
Brady Statistic AP VS Percent: 1 %
Brady Statistic AS VP Percent: 52 %
Brady Statistic AS VS Percent: 40 %
Implantable Lead Location: 753859
Lead Channel Impedance Value: 410 Ohm
Lead Channel Pacing Threshold Amplitude: 1.375 V
Lead Channel Pacing Threshold Pulse Width: 0.4 ms
Lead Channel Sensing Intrinsic Amplitude: 1.3 mV
Lead Channel Sensing Intrinsic Amplitude: 12 mV
Lead Channel Setting Pacing Amplitude: 2.125
Lead Channel Setting Pacing Pulse Width: 0.4 ms
MDC IDC LEAD IMPLANT DT: 20160822
MDC IDC LEAD IMPLANT DT: 20160822
MDC IDC LEAD LOCATION: 753860
MDC IDC MSMT BATTERY VOLTAGE: 2.99 V
MDC IDC MSMT LEADCHNL RA PACING THRESHOLD AMPLITUDE: 1.125 V
MDC IDC MSMT LEADCHNL RV IMPEDANCE VALUE: 530 Ohm
MDC IDC MSMT LEADCHNL RV PACING THRESHOLD PULSEWIDTH: 0.4 ms
MDC IDC PG IMPLANT DT: 20160822
MDC IDC PG SERIAL: 7799340
MDC IDC SESS DTM: 20181023060013
MDC IDC SET LEADCHNL RV PACING AMPLITUDE: 1.625
MDC IDC SET LEADCHNL RV SENSING SENSITIVITY: 2 mV
MDC IDC STAT BRADY AP VP PERCENT: 7.2 %
MDC IDC STAT BRADY RA PERCENT PACED: 7.3 %
MDC IDC STAT BRADY RV PERCENT PACED: 59 %

## 2017-10-12 ENCOUNTER — Encounter: Payer: Self-pay | Admitting: Cardiology

## 2017-10-17 ENCOUNTER — Encounter: Payer: Self-pay | Admitting: Internal Medicine

## 2017-10-17 ENCOUNTER — Ambulatory Visit (INDEPENDENT_AMBULATORY_CARE_PROVIDER_SITE_OTHER): Payer: Medicare Other | Admitting: Internal Medicine

## 2017-10-17 VITALS — BP 116/68 | HR 81 | Ht 75.0 in | Wt 220.0 lb

## 2017-10-17 DIAGNOSIS — Z950 Presence of cardiac pacemaker: Secondary | ICD-10-CM | POA: Diagnosis not present

## 2017-10-17 DIAGNOSIS — I459 Conduction disorder, unspecified: Secondary | ICD-10-CM

## 2017-10-17 DIAGNOSIS — R0602 Shortness of breath: Secondary | ICD-10-CM | POA: Diagnosis not present

## 2017-10-17 DIAGNOSIS — I10 Essential (primary) hypertension: Secondary | ICD-10-CM

## 2017-10-17 NOTE — Progress Notes (Signed)
HPI [name] returns today for ongoing evaluation and management of symptomatic bradycardia status post permanent pacemaker insertion. The patient is a 71 year old man with a history of hypertension and symptomatic tachybradycardia syndrome, with sinus node dysfunction. He is status post permanent pacemaker insertion. In the interim, he has experienced increasing shortness of breath and peripheral edema. He has had his afterload reduction uptitrated. His symptoms have improved a little bit. He denies syncope. Allergies  Allergen Reactions  . Amlodipine Swelling  . Crestor [Rosuvastatin] Other (See Comments)    Per pt unable to focus     Current Outpatient Prescriptions  Medication Sig Dispense Refill  . atorvastatin (LIPITOR) 40 MG tablet Take 1/2 tablet by mouth twice daily    . carvedilol (COREG) 25 MG tablet TAKE 1 TABLET BY MOUTH TWICE A DAY 180 tablet 0  . diphenhydramine-acetaminophen (TYLENOL PM) 25-500 MG TABS Take 1 tablet by mouth at bedtime as needed (sleep). Reported on 12/08/2015    . ferrous sulfate 325 (65 FE) MG tablet Take 325 mg by mouth every other day.    Marland Kitchen glipiZIDE (GLUCOTROL XL) 5 MG 24 hr tablet Take 1 tablet (5 mg total) by mouth 2 (two) times daily. 180 tablet 3  . Glucosamine-Chondroitin-MSM (TRIPLE FLEX PO) Take 1 tablet by mouth daily.     Marland Kitchen LANTUS SOLOSTAR 100 UNIT/ML Solostar Pen INJECT 6 TO 10 UNITS INTO THE SKIN EVERY DAY (MAY ADJUST AS NEEDED) 15 pen 0  . latanoprost (XALATAN) 0.005 % ophthalmic solution     . losartan-hydrochlorothiazide (HYZAAR) 100-25 MG tablet Take 1 tablet by mouth daily. 90 tablet 3  . losartan-hydrochlorothiazide (HYZAAR) 100-25 MG tablet Take 1 tablet by mouth daily. Take 1/2 tablet by mouth twice daily    . metFORMIN (GLUCOPHAGE) 1000 MG tablet TAKE 1 TABLET BY MOUTH TWICE A DAY WITH MEALS 180 tablet 3  . Multiple Vitamin (MULTIVITAMIN WITH MINERALS) TABS Take 1 tablet by mouth daily.     . Omega-3 Fatty Acids (FISH OIL  BURP-LESS PO) Take by mouth daily.    . Albuterol Sulfate 108 (90 Base) MCG/ACT AEPB Inhale 2 puffs into the lungs every 6 (six) hours as needed. Take 2 puffs 10 minutes before walking also. 1 each 0  . B-D UF III MINI PEN NEEDLES 31G X 5 MM MISC USE AS DIRECTED EVERY DAY 100 each 0  . B-D UF III MINI PEN NEEDLES 31G X 5 MM MISC USE AS DIRECTED EVERY DAY 100 each 2  . Lancets MISC Use as instructed twice a day 100 each 2  . ONE TOUCH ULTRA TEST test strip USE AS INSTRUCTED TWICE A DAY 100 each 3   No current facility-administered medications for this visit.      Past Medical History:  Diagnosis Date  . Anemia   . Cataract   . Diabetes mellitus   . GERD (gastroesophageal reflux disease)   . Hyperlipidemia   . Hypertension   . MGUS (monoclonal gammopathy of unknown significance)   . Multiple myeloma ([place]) 02/2013   normal cytogenetics and FISH panel on 03/11/2013.   . Osteoarthritis   . Right bundle branch block   . Syncope     ROS:   All systems reviewed and negative except as noted in the HPI.   Past Surgical History:  Procedure Laterality Date  . EP IMPLANTABLE DEVICE N/A 08/09/2015   Procedure: Pacemaker Implant;  Surgeon: [name], MD;  Location: [facility];  Service:  Cardiovascular;  Laterality: N/A;  . EYE SURGERY       Family History  Problem Relation Age of Onset  . Hypertension Mother   . Cancer Mother   . Stroke Mother   . Hypertension Sister   . Hypertension Brother   . Hypertension Maternal Grandmother      Social History   Social History  . Marital status: Married    Spouse name: N/A  . Number of children: N/A  . Years of education: N/A   Occupational History  . Not on file.   Social History Main Topics  . Smoking status: Former Smoker    Packs/day: 2.00    Years: 35.00    Types: Cigarettes    Start date: 03/26/1963    Quit date: 12/18/1997  . Smokeless tobacco: Never Used     Comment: quit 15 years  . Alcohol use No  . Drug  use: No  . Sexual activity: Yes    Birth control/ protection: None   Other Topics Concern  . Not on file   Social History Narrative   Married   Building Maintenance        BP 116/68   Pulse 81   Ht '6\' 3"'$  (1.905 m)   Wt 220 lb (99.8 kg)   BMI 27.50 kg/m   Physical Exam:  Well appearing NAD HEENT: Unremarkable Neck:  7 cm JVD, no thyromegally Lymphatics:  No adenopathy Back:  No CVA tenderness Lungs:  Clear, with no wheezes, rales, or rhonchi HEART:  Regular rate rhythm, no murmurs, no rubs, no clicks Abd:  soft, positive bowel sounds, no organomegally, no rebound, no guarding Ext:  2 plus pulses, 2+ peripheral edema, no cyanosis, no clubbing Skin:  No rashes no nodules Neuro:  CN II through XII intact, motor grossly intact  EKG - normal sinus rhythm with P synchronous ventricular pacing. QRS duration is 200 ms.  DEVICE  Normal device function.  See PaceArt for details.   Assess/Plan: 1. Chronic congestive heart failure, diastolic versus systolic - the patient's symptoms are class II. He is improved with up titration of his ARB. He is not on a diuretic. I've asked that he undergo 2-D echo. Prior to his pacemaker, his EF was normal. I suspect he has developed pacer induced left ventricular dysfunction. If so, consideration for an upgrade to a biventricular pacemaker would be warranted. 2. Hypertensive heart disease - his blood pressure has improved with medical therapy. He is encouraged to maintain a low-sodium diet. 3. Pacemaker -his [employer] Jude dual-chamber pacemaker is working normally. We'll recheck in several months. 4. Dyslipidemia - he will continue his statin therapy. He is encouraged to maintain a low-fat diet.  Cristopher Peru, M.D.

## 2017-10-17 NOTE — Patient Instructions (Addendum)
Medication Instructions:  Your physician recommends that you continue on your current medications as directed. Please refer to the Current Medication list given to you today.  Labwork: None ordered.  Testing/Procedures: Your physician has requested that you have an echocardiogram. Echocardiography is a painless test that uses sound waves to create images of your heart. It provides your doctor with information about the size and shape of your heart and how well your heart's chambers and valves are working. This procedure takes approximately one hour. There are no restrictions for this procedure.  Please schedule an Echocardiogram.  Follow-Up:  Your physician wants you to follow-up in: one year with Dr. Lovena Le.  You will receive a reminder letter in the mail two months in advance. If you don't receive a letter, please call our office to schedule the follow-up appointment.  Remote monitoring is used to monitor your Pacemaker from home. This monitoring reduces the number of office visits required to check your device to one time per year. It allows [place] to keep an eye on the functioning of your device to ensure it is working properly. You are scheduled for a device check from home on 01/08/2018. You may send your transmission at any time that day. If you have a wireless device, the transmission will be sent automatically. After your physician reviews your transmission, you will receive a postcard with your next transmission date.   Any Other Special Instructions Will Be Listed Below (If Applicable).     If you need a refill on your cardiac medications before your next appointment, please call your pharmacy.

## 2017-10-20 ENCOUNTER — Other Ambulatory Visit: Payer: Self-pay | Admitting: Family Medicine

## 2017-10-25 ENCOUNTER — Other Ambulatory Visit: Payer: Self-pay

## 2017-10-25 ENCOUNTER — Ambulatory Visit (HOSPITAL_COMMUNITY): Payer: Medicare Other | Attending: Cardiology

## 2017-10-25 DIAGNOSIS — I10 Essential (primary) hypertension: Secondary | ICD-10-CM | POA: Insufficient documentation

## 2017-10-25 DIAGNOSIS — E785 Hyperlipidemia, unspecified: Secondary | ICD-10-CM | POA: Diagnosis not present

## 2017-10-25 DIAGNOSIS — I517 Cardiomegaly: Secondary | ICD-10-CM | POA: Diagnosis not present

## 2017-10-25 DIAGNOSIS — C9000 Multiple myeloma not having achieved remission: Secondary | ICD-10-CM | POA: Insufficient documentation

## 2017-10-25 DIAGNOSIS — I272 Pulmonary hypertension, unspecified: Secondary | ICD-10-CM | POA: Diagnosis not present

## 2017-10-25 DIAGNOSIS — I4510 Unspecified right bundle-branch block: Secondary | ICD-10-CM | POA: Diagnosis not present

## 2017-10-25 DIAGNOSIS — R0602 Shortness of breath: Secondary | ICD-10-CM | POA: Diagnosis not present

## 2017-10-25 DIAGNOSIS — E119 Type 2 diabetes mellitus without complications: Secondary | ICD-10-CM | POA: Insufficient documentation

## 2017-10-26 LAB — CUP PACEART INCLINIC DEVICE CHECK
Battery Remaining Longevity: 130 mo
Battery Voltage: 2.99 V
Brady Statistic RV Percent Paced: 60 %
Implantable Lead Implant Date: 20160822
Implantable Lead Location: 753859
Implantable Pulse Generator Implant Date: 20160822
Lead Channel Impedance Value: 412.5 Ohm
Lead Channel Pacing Threshold Amplitude: 1.25 V
Lead Channel Pacing Threshold Pulse Width: 0.4 ms
Lead Channel Setting Pacing Amplitude: 1.5 V
Lead Channel Setting Pacing Pulse Width: 0.4 ms
MDC IDC LEAD IMPLANT DT: 20160822
MDC IDC LEAD LOCATION: 753860
MDC IDC MSMT LEADCHNL RA SENSING INTR AMPL: 1.7 mV
MDC IDC MSMT LEADCHNL RV IMPEDANCE VALUE: 537.5 Ohm
MDC IDC MSMT LEADCHNL RV PACING THRESHOLD AMPLITUDE: 1.25 V
MDC IDC MSMT LEADCHNL RV PACING THRESHOLD PULSEWIDTH: 0.4 ms
MDC IDC MSMT LEADCHNL RV SENSING INTR AMPL: 12 mV
MDC IDC PG SERIAL: 7799340
MDC IDC SESS DTM: 20181031191917
MDC IDC SET LEADCHNL RA PACING AMPLITUDE: 2.25 V
MDC IDC SET LEADCHNL RV SENSING SENSITIVITY: 2 mV
MDC IDC STAT BRADY RA PERCENT PACED: 7.1 %
Pulse Gen Model: 2240

## 2017-10-31 ENCOUNTER — Ambulatory Visit (INDEPENDENT_AMBULATORY_CARE_PROVIDER_SITE_OTHER): Payer: Medicare Other | Admitting: Pharmacist

## 2017-10-31 VITALS — BP 142/72 | HR 67

## 2017-10-31 DIAGNOSIS — I10 Essential (primary) hypertension: Secondary | ICD-10-CM | POA: Diagnosis not present

## 2017-10-31 MED ORDER — AMLODIPINE BESYLATE 5 MG PO TABS: 5.0000 mg | ORAL_TABLET | Freq: Every day | ORAL | 3 refills | Status: DC

## 2017-10-31 NOTE — Patient Instructions (Addendum)
It was great to meet you.  Continue taking atorvastatin 40mg  daily for your cholesterol. We will schedule a repeat cholesterol panel in 2 months on 12/31/17.   Continue taking carvedilol 25mg  twice daily and resume taking losartan-HCTZ 100mg -25mg  daily in the morning.  Begin taking amlodipine 5mg  daily.  Return to clinic in 1 month on 11/29/17 at 9:30 for a blood pressure check.

## 2017-10-31 NOTE — Progress Notes (Signed)
Patient ID: Michael Ostlund Sr.                 DOB: May 21, 1946                      MRN: [id number]     HPI: Michael Adan Sr. is a 71 y.o. male referred by Dr. Lovena Le. PMH is significant for HLD, DM, HTN, multiple myeloma, MGUS, GERD, and right bundle branch block. He presents to clinic today for lipid management. At last lipid clinic visit on 10/04/17 pt reports LDL drawn at [facility] was 197 mg/dl, though the last record we had was 1 year earlier and showed LDL 172 mg/dl. Pt noted he had taken atorvastatin in the past but could not recall any side effects so he was agreeable to rechallenge with atorvastatin 26m daily. In addition, his BP was uncontrolled so losartan-HCTZ was increased to 1012m25mg daily. At his last OV with Dr. TaLovena Le0/31/18 his BP was well-controlled.  Pt presents feeling like he has a cold. Pt denies dizziness, falls, blurry, and HA. Pt reports he is "getting used to the atorvastatin." He feels a little sluggish with the atorvastatin and losartan-HCTZ but it is not too bothersome. Pt reports he has been splitting the atorvastatin tablet in half and taking it twice a day which has helped symptoms. He states he has been somewhat SOB lately and he "black spots on his lungs" on a CT which he attributed this to. Pt reports he has cut back on fried foods but still drinks a 2L of diet cola every couple days. 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 Pt continues to check BP at home and it is mostly high 130s/80s, but he does not have a log with him. Pt has been taking lower dose of HCTZ for the past couple days due to concerns over recent losartan recall, but he states he checked the LOT numbers online and they should not be affected so he is willing to resume taking the higher dose.  Current HTN meds: carvedilol 2536mID, losartan-HCTZ 100m58mmg daily (taking 100mg64m5mg for past several days) BP goal: <130/80  mmHg  Current Medications: atorvastatin 40mg 41my (takes 20mg B74mIntolerances: Crestor - unable to focus, Lipitor 10mg an48mmg dai53m does not recall adverse events Risk Factors: DM, LDL > 190 LDL goal: <100mg/dL  53m: Breakfast - oatmeal with fruit or 1 whole egg with egg whites with veggies and cheese, turkey sauKuwaitr wheat toast. Lunch - leftovers from breakfast or protein shake. Dinner - fried chicken and fried okra, green beans, brown rice, baked potatoes. Dessert - sugar free cookies or sugar free apple pie. Drinks - 6 bottles of 16 oz of water each day. Cut back on Diet Coke but does drink diet drinks.   Exercise: Stopped walking due to recent dyspnea  Family History: Mother with HTN, stroke, and cancer. Sister, brother, and maternal grandmother with HTN and 3 strokes.   Social History: Former smoker 2 PPD for 35 years, quit in 1999. Denies alcohol and illicit drug use.  Labs: 08/2016: TC 244, HDL 33.6, TG 190, LDL 172 (no lipid lowering therapy)  Wt Readings from Last 3 Encounters:  10/17/17 220 lb (99.8 kg)  10/03/17 223 lb (101.2 kg)  09/26/17 224 lb 2 oz (101.7 kg)   BP Readings from Last 3 Encounters:  10/17/17 116/68  10/04/17 (!) 148/84  10/03/17 (!)Marland Kitchen  145/90   Pulse Readings from Last 3 Encounters:  10/17/17 81  10/04/17 70  10/03/17 72    Renal function: CrCl cannot be calculated (Patient's most recent lab result is older than the maximum 21 days allowed.).  Past Medical History:  Diagnosis Date  . Anemia   . Cataract   . Diabetes mellitus   . GERD (gastroesophageal reflux disease)   . Hyperlipidemia   . Hypertension   . MGUS (monoclonal gammopathy of unknown significance)   . Multiple myeloma ([place]) 02/2013   normal cytogenetics and FISH panel on 03/11/2013.   . Osteoarthritis   . Right bundle branch block   . Syncope     Current Outpatient Medications on File Prior to Visit  Medication Sig Dispense Refill  . Albuterol Sulfate 108 (90  Base) MCG/ACT AEPB Inhale 2 puffs into the lungs every 6 (six) hours as needed. Take 2 puffs 10 minutes before walking also. 1 each 0  . atorvastatin (LIPITOR) 40 MG tablet Take 1/2 tablet by mouth twice daily    . B-D UF III MINI PEN NEEDLES 31G X 5 MM MISC USE AS DIRECTED EVERY DAY 100 each 0  . B-D UF III MINI PEN NEEDLES 31G X 5 MM MISC USE AS DIRECTED EVERY DAY 100 each 2  . carvedilol (COREG) 25 MG tablet TAKE 1 TABLET BY MOUTH TWICE A DAY 180 tablet 0  . diphenhydramine-acetaminophen (TYLENOL PM) 25-500 MG TABS Take 1 tablet by mouth at bedtime as needed (sleep). Reported on 12/08/2015    . ferrous sulfate 325 (65 FE) MG tablet Take 325 mg by mouth every other day.    Marland Kitchen glipiZIDE (GLUCOTROL XL) 5 MG 24 hr tablet Take 1 tablet (5 mg total) by mouth 2 (two) times daily. 180 tablet 3  . Glucosamine-Chondroitin-MSM (TRIPLE FLEX PO) Take 1 tablet by mouth daily.     . Lancets MISC Use as instructed twice a day 100 each 2  . LANTUS SOLOSTAR 100 UNIT/ML Solostar Pen INJECT 6 TO 10 UNITS INTO THE SKIN EVERY DAY (MAY ADJUST AS NEEDED) 15 pen 0  . latanoprost (XALATAN) 0.005 % ophthalmic solution     . losartan-hydrochlorothiazide (HYZAAR) 100-25 MG tablet Take 1 tablet by mouth daily. 90 tablet 3  . losartan-hydrochlorothiazide (HYZAAR) 100-25 MG tablet Take 1 tablet by mouth daily. Take 1/2 tablet by mouth twice daily    . metFORMIN (GLUCOPHAGE) 1000 MG tablet TAKE 1 TABLET BY MOUTH TWICE A DAY WITH MEALS 180 tablet 3  . Multiple Vitamin (MULTIVITAMIN WITH MINERALS) TABS Take 1 tablet by mouth daily.     . Omega-3 Fatty Acids (FISH OIL BURP-LESS PO) Take by mouth daily.    . ONE TOUCH ULTRA TEST test strip USE AS INSTRUCTED TWICE A DAY 100 each 3   No current facility-administered medications on file prior to visit.     Allergies  Allergen Reactions  . Amlodipine Swelling  . Crestor [Rosuvastatin] Other (See Comments)    Per pt unable to focus     Assessment/Plan:  1. Hypertension -  Currently above goal <130/80 mmHg in clinic. Pt has been taking lower dose of HCTZ over the past few days which could be contributing but he endorses SBP readings at home mostly in the high 130s. Will continue carvedilol 66m BID and losartan-HCTZ 1074m236mg daily - encouraged pt to call pharmacy to check LOT number of product if he remains concerned over recent recall. Will begin amlodipine 36m48maily and schedule F/U BP check  in 1 month. Encouraged pt to continue to limit salt intake and try to remain active.  2. Hyperlipidemia - Pt is tolerating rechallenge of statin therapy well and only reports mild sluggishness which could be unrelated. Pt agrees to remain on therapy at this time so will schedule a F/U lipid panel in 2 months to assess LDL control. Encourage pt to limit intake of fried foods.  Arrie Senate, PharmD PGY-2 Cardiology Pharmacy Resident Pager: [phone] 10/31/2017

## 2017-11-02 ENCOUNTER — Other Ambulatory Visit: Payer: Self-pay | Admitting: Family Medicine

## 2017-11-02 DIAGNOSIS — IMO0001 Reserved for inherently not codable concepts without codable children: Secondary | ICD-10-CM

## 2017-11-02 DIAGNOSIS — Z794 Long term (current) use of insulin: Principal | ICD-10-CM

## 2017-11-02 DIAGNOSIS — E1165 Type 2 diabetes mellitus with hyperglycemia: Principal | ICD-10-CM

## 2017-11-16 ENCOUNTER — Ambulatory Visit: Payer: Medicare Other | Admitting: Medical

## 2017-11-16 ENCOUNTER — Ambulatory Visit (HOSPITAL_BASED_OUTPATIENT_CLINIC_OR_DEPARTMENT_OTHER)
Admission: RE | Admit: 2017-11-16 | Discharge: 2017-11-16 | Disposition: A | Discharge status: Home / Self Care | Payer: Medicare Other | Source: Ambulatory Visit | Attending: Medical | Admitting: Medical

## 2017-11-16 ENCOUNTER — Ambulatory Visit (INDEPENDENT_AMBULATORY_CARE_PROVIDER_SITE_OTHER): Payer: Medicare Other | Admitting: Medical

## 2017-11-16 ENCOUNTER — Encounter: Payer: Self-pay | Admitting: Medical

## 2017-11-16 VITALS — HR 73 | Temp 98.1°F | Resp 16 | Wt 231.2 lb

## 2017-11-16 DIAGNOSIS — R9430 Abnormal result of cardiovascular function study, unspecified: Secondary | ICD-10-CM

## 2017-11-16 DIAGNOSIS — R05 Cough: Secondary | ICD-10-CM

## 2017-11-16 DIAGNOSIS — J209 Acute bronchitis, unspecified: Secondary | ICD-10-CM

## 2017-11-16 DIAGNOSIS — I517 Cardiomegaly: Secondary | ICD-10-CM | POA: Insufficient documentation

## 2017-11-16 DIAGNOSIS — R059 Cough, unspecified: Secondary | ICD-10-CM

## 2017-11-16 DIAGNOSIS — Z950 Presence of cardiac pacemaker: Secondary | ICD-10-CM | POA: Diagnosis not present

## 2017-11-16 DIAGNOSIS — R062 Wheezing: Secondary | ICD-10-CM

## 2017-11-16 DIAGNOSIS — R079 Chest pain, unspecified: Secondary | ICD-10-CM | POA: Diagnosis not present

## 2017-11-16 DIAGNOSIS — J90 Pleural effusion, not elsewhere classified: Secondary | ICD-10-CM | POA: Insufficient documentation

## 2017-11-16 DIAGNOSIS — J439 Emphysema, unspecified: Secondary | ICD-10-CM | POA: Diagnosis not present

## 2017-11-16 MED ORDER — BENZONATATE 100 MG PO CAPS: 100.0000 mg | ORAL_CAPSULE | Freq: Three times a day (TID) | ORAL | 0 refills | Status: DC | PRN

## 2017-11-16 MED ORDER — BUDESONIDE-FORMOTEROL FUMARATE 80-4.5 MCG/ACT IN AERO
2.0000 | INHALATION_SPRAY | Freq: Two times a day (BID) | RESPIRATORY_TRACT | 3 refills | Status: DC
Start: 2017-11-16 — End: 2017-12-26

## 2017-11-16 MED ORDER — AZITHROMYCIN 250 MG PO TABS: ORAL_TABLET | ORAL | 0 refills | Status: DC

## 2017-11-16 NOTE — Patient Instructions (Addendum)
You have some symptoms of bronchitis like over the last 2 months and her coughing some mucus.  We will go ahead and prescribe you a azithromycin antibiotic and benzonatate for cough  Your low ejection fraction, pedal edema and shortness of breath warrant chest x-ray and BNP.  This will help [place] assess if there is any heart failure.  I do want you to mention to your cardiologist these tests were run today.  Notify them on Monday when you see them.  With your history of smoking for numerous years and wheezing it is likely that some component of COPD.  I will write Symbicort inhaler to use daily and continue albuterol every 6 hours if needed as well.  Also will include in your labs today CBC and cmp  Your blood pressure is borderline mild high today.  I will let your cardiologist for long-term changes.  Continue current medication regimen  Follow-up in 7-10 days with PCP or as needed

## 2017-11-16 NOTE — Progress Notes (Signed)
Subjective:    Patient ID: Michael Maggio Sr., male    DOB: 08/17/1946, 71 y.o.   MRN: [id number]  HPI  Pt in states has been coughing over past month. When he breaths deep will get occasional left lower rib region pain transient. Cough is worse at night. Pt is hearing wheeze. He will cough up mucous sometimes other times cough is dry. Pt wife concerned since relative of his died from pneumonia.  Pt has history of pacemaker device.  Pt former smoker. He states smoked for 33 years.  Pt states he used inhaler helped a little but not much.     Pt baseline temp per his report 97.6. He has hx of multiple myeloma.    Review of Systems  Constitutional: Positive for fatigue. Negative for chills and fever.  HENT: Negative for congestion.   Respiratory: Positive for cough, shortness of breath and wheezing. Negative for chest tightness.   Cardiovascular: Negative for chest pain and palpitations.  Gastrointestinal: Negative for abdominal pain, blood in stool, constipation and vomiting.  Genitourinary: Negative for dysuria and flank pain.  Musculoskeletal: Negative for back pain, joint swelling, myalgias and neck stiffness.  Skin: Negative for pallor and rash.  Neurological: Negative for dizziness, weakness, light-headedness and headaches.  Hematological: Negative for adenopathy. Does not bruise/bleed easily.  Psychiatric/Behavioral: Negative for behavioral problems, confusion, dysphoric mood and sleep disturbance. The patient is not nervous/anxious.    Past Medical History:  Diagnosis Date  . Anemia   . Cataract   . Diabetes mellitus   . GERD (gastroesophageal reflux disease)   . Hyperlipidemia   . Hypertension   . MGUS (monoclonal gammopathy of unknown significance)   . Multiple myeloma ([facility]) 02/2013   normal cytogenetics and FISH panel on 03/11/2013.   . Osteoarthritis   . Right bundle branch block   . Syncope      Social History   Socioeconomic History  . Marital status:  Married    Spouse name: Not on file  . Number of children: Not on file  . Years of education: Not on file  . Highest education level: Not on file  Social Needs  . Financial resource strain: Not on file  . Food insecurity - worry: Not on file  . Food insecurity - inability: Not on file  . Transportation needs - medical: Not on file  . Transportation needs - non-medical: Not on file  Occupational History  . Not on file  Tobacco Use  . Smoking status: Former Smoker    Packs/day: 2.00    Years: 35.00    Pack years: 70.00    Types: Cigarettes    Start date: 03/26/1963    Last attempt to quit: 12/18/1997    Years since quitting: 19.9  . Smokeless tobacco: Never Used  . Tobacco comment: quit 15 years  Substance and Sexual Activity  . Alcohol use: No    Alcohol/week: 0.0 oz  . Drug use: No  . Sexual activity: Yes    Birth control/protection: None  Other Topics Concern  . Not on file  Social History Narrative   Married   Building Maintenance    Past Surgical History:  Procedure Laterality Date  . EP IMPLANTABLE DEVICE N/A 08/09/2015   Procedure: Pacemaker Implant;  Surgeon: [name], MD;  Location: [facility];  Service: Cardiovascular;  Laterality: N/A;  . EYE SURGERY      Family History  Problem Relation Age of Onset  . Hypertension Mother   .  Cancer Mother   . Stroke Mother   . Hypertension Sister   . Hypertension Brother   . Hypertension Maternal Grandmother     Allergies  Allergen Reactions  . Amlodipine Swelling  . Crestor [Rosuvastatin] Other (See Comments)    Per pt unable to focus    Current Outpatient Medications on File Prior to Visit  Medication Sig Dispense Refill  . Albuterol Sulfate 108 (90 Base) MCG/ACT AEPB Inhale 2 puffs into the lungs every 6 (six) hours as needed. Take 2 puffs 10 minutes before walking also. 1 each 0  . amLODipine (NORVASC) 5 MG tablet Take 1 tablet (5 mg total) daily by mouth. 30 tablet 3  . atorvastatin (LIPITOR)  40 MG tablet Take 1/2 tablet by mouth twice daily    . B-D UF III MINI PEN NEEDLES 31G X 5 MM MISC USE AS DIRECTED EVERY DAY 100 each 0  . B-D UF III MINI PEN NEEDLES 31G X 5 MM MISC USE AS DIRECTED EVERY DAY 100 each 2  . carvedilol (COREG) 25 MG tablet TAKE 1 TABLET BY MOUTH TWICE A DAY 180 tablet 0  . diphenhydramine-acetaminophen (TYLENOL PM) 25-500 MG TABS Take 1 tablet by mouth at bedtime as needed (sleep). Reported on 12/08/2015    . ferrous sulfate 325 (65 FE) MG tablet Take 325 mg by mouth every other day.    Marland Kitchen glipiZIDE (GLUCOTROL XL) 5 MG 24 hr tablet Take 1 tablet (5 mg total) by mouth 2 (two) times daily. 180 tablet 3  . Glucosamine-Chondroitin-MSM (TRIPLE FLEX PO) Take 1 tablet by mouth daily.     . Lancets MISC Use as instructed twice a day 100 each 2  . LANTUS SOLOSTAR 100 UNIT/ML Solostar Pen INJECT 6 TO 10 UNITS INTO THE SKIN EVERY DAY (MAY ADJUST AS NEEDED) 15 mL 0  . latanoprost (XALATAN) 0.005 % ophthalmic solution     . losartan-hydrochlorothiazide (HYZAAR) 100-25 MG tablet Take 1 tablet by mouth daily. 90 tablet 3  . losartan-hydrochlorothiazide (HYZAAR) 100-25 MG tablet Take 1 tablet by mouth daily. Take 1/2 tablet by mouth twice daily    . metFORMIN (GLUCOPHAGE) 1000 MG tablet TAKE 1 TABLET BY MOUTH TWICE A DAY WITH MEALS 180 tablet 3  . Multiple Vitamin (MULTIVITAMIN WITH MINERALS) TABS Take 1 tablet by mouth daily.     . Omega-3 Fatty Acids (FISH OIL BURP-LESS PO) Take by mouth daily.    . ONE TOUCH ULTRA TEST test strip USE AS INSTRUCTED TWICE A DAY 100 each 3   No current facility-administered medications on file prior to visit.     Pulse 73   Temp 98.1 F (36.7 C) (Oral)   Resp 16   Wt 231 lb 3.2 oz (104.9 kg)   SpO2 99%   BMI 28.90 kg/m       Objective:   Physical Exam   General  Mental Status - Alert. General Appearance - Well groomed. Not in acute distress.  Skin Rashes- No Rashes.  HEENT Head- Normal. Ear Auditory Canal - Left- Normal.  Right - Normal.Tympanic Membrane- Left- Normal. Right- Normal. Eye Sclera/Conjunctiva- Left- Normal. Right- Normal. Nose & Sinuses Nasal Mucosa- Left-  Not  Boggy and Congested. Right- not   Boggy and  Congested.Bilateral no  maxillary and no frontal sinus pressure. Mouth & Throat Lips: Upper Lip- Normal: no dryness, cracking, pallor, cyanosis, or vesicular eruption. Lower Lip-Normal: no dryness, cracking, pallor, cyanosis or vesicular eruption. Buccal Mucosa- Bilateral- No Aphthous ulcers. Oropharynx- No Discharge or  Erythema. Tonsils: Characteristics- Bilateral- No Erythema or Congestion. Size/Enlargement- Bilateral- No enlargement. Discharge- bilateral-None.  Neck Neck- Supple. No Masses.   Chest and Lung Exam Auscultation: Breath Sounds:-Clear even and unlabored but mild shallow  Cardiovascular Auscultation:Rythm- Regular, rate and rhythm. Murmurs & Other Heart Sounds:Ausculatation of the heart reveal- No Murmurs.  Lymphatic Head & Neck General Head & Neck Lymphatics: Bilateral: Description- No Localized lymphadenopathy.  Lower ext- 1-2+ bilateral. Calfs symmetric. Negative homans signs.  Left lower ribs- no tenderness to palpation presently.      Assessment & Plan:  You have some symptoms of bronchitis like over the last 2 months and her coughing some mucus.  We will go ahead and prescribe you a azithromycin antibiotic and benzonatate for cough  Your low ejection fraction, pedal edema and shortness of breath warrant chest x-ray and BNP.  This will help [place] assess if there is any heart failure.  I do want you to mention to your cardiologist these tests were run today.  Notify them on Monday when you see them.  With your history of smoking for numerous years and wheezing it is likely that some component of COPD.  I will write Symbicort inhaler to use daily and continue albuterol every 6 hours if needed as well.  Also will include in your labs today CBC and cmp  Follow-up in  7-10 days with PCP or as needed  [place], Percell Miller, [employer]

## 2017-11-19 ENCOUNTER — Telehealth: Payer: Self-pay | Admitting: *Deleted

## 2017-11-19 LAB — CBC WITH DIFFERENTIAL/PLATELET
BASOS ABS: 64 {cells}/uL (ref 0–200)
Basophils Relative: 0.7 %
EOS ABS: 319 {cells}/uL (ref 15–500)
Eosinophils Relative: 3.5 %
HEMATOCRIT: 28.7 % — AB (ref 38.5–50.0)
HEMOGLOBIN: 9.5 g/dL — AB (ref 13.2–17.1)
LYMPHS ABS: 1674 {cells}/uL (ref 850–3900)
MCH: 27.9 pg (ref 27.0–33.0)
MCHC: 33.1 g/dL (ref 32.0–36.0)
MCV: 84.2 fL (ref 80.0–100.0)
MPV: 10.7 fL (ref 7.5–12.5)
Monocytes Relative: 8.1 %
NEUTROS ABS: 6306 {cells}/uL (ref 1500–7800)
NEUTROS PCT: 69.3 %
Platelets: 395 10*3/uL (ref 140–400)
RBC: 3.41 10*6/uL — ABNORMAL LOW (ref 4.20–5.80)
RDW: 13.3 % (ref 11.0–15.0)
Total Lymphocyte: 18.4 %
WBC: 9.1 10*3/uL (ref 3.8–10.8)
WBCMIX: 737 {cells}/uL (ref 200–950)

## 2017-11-19 LAB — COMPREHENSIVE METABOLIC PANEL
AG Ratio: 0.9 (calc) — ABNORMAL LOW (ref 1.0–2.5)
ALBUMIN MSPROF: 2.9 g/dL — AB (ref 3.6–5.1)
ALT: 30 U/L (ref 9–46)
AST: 17 U/L (ref 10–35)
Alkaline phosphatase (APISO): 65 U/L (ref 40–115)
BILIRUBIN TOTAL: 0.3 mg/dL (ref 0.2–1.2)
BUN: 18 mg/dL (ref 7–25)
CALCIUM: 8.8 mg/dL (ref 8.6–10.3)
CO2: 24 mmol/L (ref 20–32)
CREATININE: 1.07 mg/dL (ref 0.70–1.18)
Chloride: 99 mmol/L (ref 98–110)
GLUCOSE: 116 mg/dL — AB (ref 65–99)
Globulin: 3.4 g/dL (calc) (ref 1.9–3.7)
POTASSIUM: 4.4 mmol/L (ref 3.5–5.3)
SODIUM: 132 mmol/L — AB (ref 135–146)
TOTAL PROTEIN: 6.3 g/dL (ref 6.1–8.1)

## 2017-11-19 LAB — BRAIN NATRIURETIC PEPTIDE

## 2017-11-19 NOTE — Telephone Encounter (Signed)
Received results from [place]; forwarded to provider/SLS 12/03

## 2017-11-29 ENCOUNTER — Ambulatory Visit (INDEPENDENT_AMBULATORY_CARE_PROVIDER_SITE_OTHER): Payer: Medicare Other | Admitting: Pharmacist

## 2017-11-29 VITALS — BP 140/84 | HR 77

## 2017-11-29 DIAGNOSIS — I10 Essential (primary) hypertension: Secondary | ICD-10-CM

## 2017-11-29 NOTE — Progress Notes (Signed)
Patient ID: Michael Kashuba Sr.                 DOB: [birth year]                      MRN: [id number]     HPI: Michael Armitage Sr. is a very pleasant 71 y.o. male patient referred to lipid clinic by Dr Lovena Le. PMH is significant for HLD, DM, HTN, multiple myeloma, MGUS, GERD, and right bundle branch block. He presents to clinic today for lipid management. Pt was recently started on atorvastatin 10m daily due to LDL of 1943mdL. At his last visit, he was started on amlodipine for better BP control.  Pt presents today in good spirits. He reports not picking up the amlodipine because he did not want to start a new medication. He is worried that his medications cause fatigue, and states that he and his wife generally do not like to take medications if they can avoid it. He has switched to diet soda but is still drinking 10 cups of coffee each day. He asks if his Lipitor is causing him to urinate frequently at night, however we discussed that drinking coffee up until bed time is likely the cause. He has checked his BP occasionally and reports highest reading of 140/upper 80s and lowest reading of 130/lower 80s.  Current HTN meds: carvedilol 2555mID, losartan-HCTZ 100m23mmg daily, amlodipine 5mg 78mly (never picked up) BP goal: <130/80 mmHg  Current Medications:atorvastatin 40mg 27my (takes 20mg B49mIntolerances:Crestor - unable to focus, Lipitor 10mg an67mmg dai74mdoes not recall adverse events Risk Factors:DM, LDL > 190 LDL goal:<100mg/dL  77m: Breakfast - oatmeal with fruit or 1 whole egg with egg whites with veggies and cheese, turkey sauKuwaitr wheat toast. Lunch - leftovers from breakfast or protein shake. Dinner - fried chicken and fried okra, green beans, brown rice, baked potatoes. Dessert - sugar free cookies or sugar free apple pie. Drinks - 6 bottles of 16 oz of water each day. Drinks diet caffeine free Pepsi and 10 cups of coffee each day (1/2 caffeinated 1/2 non  caffeinated).  Exercise: Walking 45 minutes every morning - brisk walk for 2.5 miles.   Family History: Mother with HTN, stroke, and cancer. Sister, brother, and maternal grandmother with HTN and 3 strokes.   Social History: Former smoker 2 PPD for 35 years, quit in 1999. Denies alcohol and illicit drug use.  Labs: 08/2016: TC 244, HDL 33.6, TG 190, LDL 172 (no lipid lowering therapy)  Wt Readings from Last 3 Encounters:  11/16/17 231 lb 3.2 oz (104.9 kg)  10/17/17 220 lb (99.8 kg)  10/03/17 223 lb (101.2 kg)   BP Readings from Last 3 Encounters:  10/31/17 (!) 142/72  10/17/17 116/68  10/04/17 (!) 148/84   Pulse Readings from Last 3 Encounters:  11/16/17 73  10/31/17 67  10/17/17 81    Renal function: Estimated Creatinine Clearance: 81.8 mL/min (by C-G formula based on SCr of 1.07 mg/dL).  Past Medical History:  Diagnosis Date  . Anemia   . Cataract   . Diabetes mellitus   . GERD (gastroesophageal reflux disease)   . Hyperlipidemia   . Hypertension   . MGUS (monoclonal gammopathy of unknown significance)   . Multiple myeloma ([facility]) 3/201[place] normal cytogenetics and FISH panel on 03/11/2013.   . Osteoarthritis   . Right bundle branch block   . Syncope     Current Outpatient Medications on File Prior to  Visit  Medication Sig Dispense Refill  . Albuterol Sulfate 108 (90 Base) MCG/ACT AEPB Inhale 2 puffs into the lungs every 6 (six) hours as needed. Take 2 puffs 10 minutes before walking also. 1 each 0  . amLODipine (NORVASC) 5 MG tablet Take 1 tablet (5 mg total) daily by mouth. 30 tablet 3  . atorvastatin (LIPITOR) 40 MG tablet Take 1/2 tablet by mouth twice daily    . azithromycin (ZITHROMAX) 250 MG tablet Take 2 tablets by mouth on day 1, followed by 1 tablet by mouth daily for 4 days. 6 tablet 0  . B-D UF III MINI PEN NEEDLES 31G X 5 MM MISC USE AS DIRECTED EVERY DAY 100 each 0  . B-D UF III MINI PEN NEEDLES 31G X 5 MM MISC USE AS DIRECTED EVERY DAY 100 each 2   . benzonatate (TESSALON) 100 MG capsule Take 1 capsule (100 mg total) by mouth 3 (three) times daily as needed for cough. 21 capsule 0  . budesonide-formoterol (SYMBICORT) 80-4.5 MCG/ACT inhaler Inhale 2 puffs into the lungs 2 (two) times daily. 1 Inhaler 3  . carvedilol (COREG) 25 MG tablet TAKE 1 TABLET BY MOUTH TWICE A DAY 180 tablet 0  . diphenhydramine-acetaminophen (TYLENOL PM) 25-500 MG TABS Take 1 tablet by mouth at bedtime as needed (sleep). Reported on 12/08/2015    . ferrous sulfate 325 (65 FE) MG tablet Take 325 mg by mouth every other day.    Marland Kitchen glipiZIDE (GLUCOTROL XL) 5 MG 24 hr tablet Take 1 tablet (5 mg total) by mouth 2 (two) times daily. 180 tablet 3  . Glucosamine-Chondroitin-MSM (TRIPLE FLEX PO) Take 1 tablet by mouth daily.     . Lancets MISC Use as instructed twice a day 100 each 2  . LANTUS SOLOSTAR 100 UNIT/ML Solostar Pen INJECT 6 TO 10 UNITS INTO THE SKIN EVERY DAY (MAY ADJUST AS NEEDED) 15 mL 0  . latanoprost (XALATAN) 0.005 % ophthalmic solution     . losartan-hydrochlorothiazide (HYZAAR) 100-25 MG tablet Take 1 tablet by mouth daily. 90 tablet 3  . losartan-hydrochlorothiazide (HYZAAR) 100-25 MG tablet Take 1 tablet by mouth daily. Take 1/2 tablet by mouth twice daily    . metFORMIN (GLUCOPHAGE) 1000 MG tablet TAKE 1 TABLET BY MOUTH TWICE A DAY WITH MEALS 180 tablet 3  . Multiple Vitamin (MULTIVITAMIN WITH MINERALS) TABS Take 1 tablet by mouth daily.     . Omega-3 Fatty Acids (FISH OIL BURP-LESS PO) Take by mouth daily.    . ONE TOUCH ULTRA TEST test strip USE AS INSTRUCTED TWICE A DAY 100 each 3   No current facility-administered medications on file prior to visit.     Allergies  Allergen Reactions  . Amlodipine Swelling  . Crestor [Rosuvastatin] Other (See Comments)    Per pt unable to focus     Assessment/Plan:  1. Hypertension - BP close to goal <130/48mHg in right arm and slightly elevated in left arm. Pt did not pick up his amlodipine as directed at  his last visit because he did not want to start a new medication. Discussed that if pt does not want to start amlodipine, he needs to work hard to decrease his coffee intake from 10 cups daily. He prefers to work on this and will continue carvedilol 266mBID and losartan-HCTZ 100-2570maily. F/u in 1 month to recheck BP and f/u with caffeine intake on same day that lipid panel is scheduled since restarting Lipitor 54m36mily.   Megan E.  Martine Trageser, PharmD, CPP, [facility] [address], [place], [place] [address] Phone: [phone]; Fax: [phone] 11/29/2017 10:14 AM

## 2017-11-29 NOTE — Patient Instructions (Addendum)
It was nice to see you today.  Continue taking your current medications.  Try to cut back on your coffee intake each day. Switch to decaf coffee or tea.  Follow up in 1 month for fasting lab work and blood pressure check in 1 month on Monday, January 14th at [facility].

## 2017-11-30 ENCOUNTER — Encounter: Payer: Medicare Other | Admitting: Internal Medicine

## 2017-12-07 ENCOUNTER — Encounter: Payer: Medicare Other | Admitting: Internal Medicine

## 2017-12-07 ENCOUNTER — Encounter: Payer: Self-pay | Admitting: Internal Medicine

## 2017-12-07 ENCOUNTER — Ambulatory Visit: Payer: Medicare Other | Admitting: Internal Medicine

## 2017-12-07 VITALS — BP 124/76 | HR 80 | Ht 75.0 in | Wt 228.0 lb

## 2017-12-07 DIAGNOSIS — I5022 Chronic systolic (congestive) heart failure: Secondary | ICD-10-CM | POA: Diagnosis not present

## 2017-12-07 DIAGNOSIS — Z01812 Encounter for preprocedural laboratory examination: Secondary | ICD-10-CM

## 2017-12-07 DIAGNOSIS — I447 Left bundle-branch block, unspecified: Secondary | ICD-10-CM | POA: Diagnosis not present

## 2017-12-07 DIAGNOSIS — R0602 Shortness of breath: Secondary | ICD-10-CM

## 2017-12-07 DIAGNOSIS — Z950 Presence of cardiac pacemaker: Secondary | ICD-10-CM | POA: Diagnosis not present

## 2017-12-07 NOTE — Patient Instructions (Addendum)
Medication Instructions:  Your physician recommends that you continue on your current medications as directed. Please refer to the Current Medication list given to you today.  If you need a refill on your cardiac medications before your next appointment, please call your pharmacy.  Labwork: You will need blood work on December 26, 2017.  You can come in at any time.  You do not need to be fasting.  Testing/Procedures: Your doctor has advised you upgrade from a dual chamber pacemaker to a biventricular pacemaker.  Follow-Up:  You will have a wound check 10-14 days after your procedure with the device clinic.  You will follow up with Dr. Lovena Le 91 days after your procedure.  Any Other Special Instructions Will Be Listed Below (If Applicable).  Please arrive at the [facility] main entrance of [facility] at:  11:00 am on January 04, 2018 Use the CHG scrub given to you.  Follow the direction sheet. Do not eat or drink after 7 am on the morning of your procedure (you may have a light breakfast before 7 am) Do not take any medications the morning of the procedure-(only take your carvedilol with a sip of water) Plan for one night stay You will need someone to drive you home at discharge

## 2017-12-08 ENCOUNTER — Other Ambulatory Visit: Payer: Self-pay | Admitting: Family Medicine

## 2017-12-08 DIAGNOSIS — IMO0001 Reserved for inherently not codable concepts without codable children: Secondary | ICD-10-CM

## 2017-12-08 DIAGNOSIS — Z794 Long term (current) use of insulin: Principal | ICD-10-CM

## 2017-12-08 DIAGNOSIS — E1165 Type 2 diabetes mellitus with hyperglycemia: Principal | ICD-10-CM

## 2017-12-09 LAB — CUP PACEART INCLINIC DEVICE CHECK
Battery Voltage: 2.99 V
Brady Statistic RA Percent Paced: 0.56 %
Brady Statistic RV Percent Paced: 99 %
Implantable Lead Implant Date: 20160822
Lead Channel Impedance Value: 412.5 Ohm
Lead Channel Impedance Value: 550 Ohm
Lead Channel Pacing Threshold Amplitude: 0.75 V
Lead Channel Pacing Threshold Amplitude: 1.5 V
Lead Channel Pacing Threshold Amplitude: 1.5 V
Lead Channel Pacing Threshold Pulse Width: 0.4 ms
Lead Channel Pacing Threshold Pulse Width: 0.4 ms
Lead Channel Sensing Intrinsic Amplitude: 2.6 mV
Lead Channel Sensing Intrinsic Amplitude: 3.2 mV
Lead Channel Setting Pacing Amplitude: 2 V
Lead Channel Setting Pacing Pulse Width: 0.4 ms
Lead Channel Setting Sensing Sensitivity: 2 mV
MDC IDC LEAD IMPLANT DT: 20160822
MDC IDC LEAD LOCATION: 753859
MDC IDC LEAD LOCATION: 753860
MDC IDC MSMT BATTERY REMAINING LONGEVITY: 126 mo
MDC IDC MSMT LEADCHNL RA PACING THRESHOLD AMPLITUDE: 0.75 V
MDC IDC MSMT LEADCHNL RA PACING THRESHOLD PULSEWIDTH: 0.4 ms
MDC IDC MSMT LEADCHNL RV PACING THRESHOLD PULSEWIDTH: 0.4 ms
MDC IDC PG IMPLANT DT: 20160822
MDC IDC PG SERIAL: 7799340
MDC IDC SESS DTM: 20181221212026
MDC IDC SET LEADCHNL RV PACING AMPLITUDE: 1.75 V

## 2017-12-10 NOTE — Progress Notes (Signed)
[place] at [facility] [address], [place], [place] [address] [phone] [id number]  Date:  12/13/2017   Name:  Michael Christofferson Sr.   DOB:  [birth year]   MRN:  [id number]  PCP:  Darreld Mclean, MD    Chief Complaint: No chief complaint on file.   History of Present Illness:  Michael Okane Sr. is a 71 y.o. very pleasant male patient who presents with the following:  Here today for a follow-up visit History of DM, MM,HTN, hyperlipidemia I last saw him in June  Here today for a followup visit  Last seen by myself in December to follow-up on his DM.  We had to add back his lantus in September of 2017 due to high A1c.  His A1c had improved to the 8s per his report at our last visit in December [facility]       Lab Results  Component Value Date   HGBA1C 9.1 (H) 08/24/2016     He is monitored by Dr. Marin Olp as follows, per his most recent note  Principle Diagnosis:   IgG kappa smoldering myeloma  Iron deficiency anemia  Current Therapy:  Observation  His glucose is running approx 120- 130 fasting, and up to about 170 in the afternoon.   He is on 6 units of lantus in addition to his oral meds right now A couple of weeks ago he drank a bunch of coffee with chicory in it and this may have lowered his glucose.  Other than this he has not had low glucose readings Metformin BID, glipizide   He is keeping very busy. He gets lot of physical activity for someone his age He is splitting his losartan HCT in two and taking it BID He saw Dr. Lovena Le last in October to check on his pacemaker Vasili also mentions that he seems to have a bony bump on his left sternum- he is not sure if this is something to be concerned about.  He has noted this for the last approx 4 years, it is not painful Went back to look at previous CXR and found report from 2016  Lab Results  Component Value Date   HGBA1C 8.0 (H) 06/07/2017   Needs foot  exam A1c today Eye exam: this is scheduled for January  Flu: will do today  Noted mild hyponatremia at last labs- will check for him today with A1c Dr. Lovena Le is planning to upgrade his pacer in January as he is having some sx with his CHF Assess/Plan: 1. Chronic congestive heart failure, diastolic versus systolic - the patient's symptoms are class II. He is improved with up titration of his ARB. He is not on a diuretic. I've asked that he undergo 2-D echo. Prior to his pacemaker, his EF was normal. I suspect he has developed pacer induced left ventricular dysfunction. If so, consideration for an upgrade to a biventricular pacemaker would be warranted. 2. Hypertensive heart disease - his blood pressure has improved with medical therapy. He is encouraged to maintain a low-sodium diet. 3. Pacemaker -his [employer] Jude dual-chamber pacemaker is working normally. We'll recheck in several months. 4. Dyslipidemia - he will continue his statin therapy. He is encouraged to maintain a low-fat diet.  He is ready to get his new pacer in January.  He has started to have some more sx of CHF with SOB  He is taking 6 units of lantus- he had his A1c done at the [place]  a couple of months ago and it was at 7%.  Patient Active Problem List   Diagnosis Date Noted  . Pacemaker 12/08/2015  . Mobitz type 2 second degree heart block 08/08/2015  . Right bundle branch block 12/16/2014  . Syncope 12/16/2014  . Proteinuria due to type 2 diabetes mellitus ([place]) 11/12/2014  . Encounter for therapeutic drug monitoring 03/13/2013  . Multiple myeloma ([place]) 02/15/2013  . Edema 01/07/2013  . Diabetes mellitus due to underlying condition with other diabetic kidney complication ([place])   . Hyperlipidemia   . Hypertension   . MGUS (monoclonal gammopathy of unknown significance)   . GERD (gastroesophageal reflux disease)     Past Medical History:  Diagnosis Date  . Anemia   . Cataract   . Diabetes mellitus   . GERD  (gastroesophageal reflux disease)   . Hyperlipidemia   . Hypertension   . MGUS (monoclonal gammopathy of unknown significance)   . Multiple myeloma ([place]) 02/2013   normal cytogenetics and FISH panel on 03/11/2013.   . Osteoarthritis   . Right bundle branch block   . Syncope     Past Surgical History:  Procedure Laterality Date  . EP IMPLANTABLE DEVICE N/A 08/09/2015   Procedure: Pacemaker Implant;  Surgeon: [name], MD;  Location: [facility];  Service: Cardiovascular;  Laterality: N/A;  . EYE SURGERY      Social History   Tobacco Use  . Smoking status: Former Smoker    Packs/day: 2.00    Years: 35.00    Pack years: 70.00    Types: Cigarettes    Start date: 03/26/1963    Last attempt to quit: 12/18/1997    Years since quitting: 20.0  . Smokeless tobacco: Never Used  . Tobacco comment: quit 15 years  Substance Use Topics  . Alcohol use: No    Alcohol/week: 0.0 oz  . Drug use: No    Family History  Problem Relation Age of Onset  . Hypertension Mother   . Cancer Mother   . Stroke Mother   . Hypertension Sister   . Hypertension Brother   . Hypertension Maternal Grandmother     Allergies  Allergen Reactions  . Amlodipine Swelling  . Crestor [Rosuvastatin] Other (See Comments)    Per pt unable to focus    Medication list has been reviewed and updated.  Current Outpatient Medications on File Prior to Visit  Medication Sig Dispense Refill  . Albuterol Sulfate 108 (90 Base) MCG/ACT AEPB Inhale 2 puffs into the lungs every 6 (six) hours as needed. Take 2 puffs 10 minutes before walking also. 1 each 0  . atorvastatin (LIPITOR) 40 MG tablet Take 1/2 tablet by mouth twice daily    . azithromycin (ZITHROMAX) 250 MG tablet Take 2 tablets by mouth on day 1, followed by 1 tablet by mouth daily for 4 days. 6 tablet 0  . B-D UF III MINI PEN NEEDLES 31G X 5 MM MISC USE AS DIRECTED EVERY DAY 100 each 0  . B-D UF III MINI PEN NEEDLES 31G X 5 MM MISC USE AS DIRECTED  EVERY DAY 100 each 2  . benzonatate (TESSALON) 100 MG capsule Take 1 capsule (100 mg total) by mouth 3 (three) times daily as needed for cough. 21 capsule 0  . budesonide-formoterol (SYMBICORT) 80-4.5 MCG/ACT inhaler Inhale 2 puffs into the lungs 2 (two) times daily. 1 Inhaler 3  . carvedilol (COREG) 25 MG tablet TAKE 1 TABLET BY MOUTH TWICE A DAY 180  tablet 0  . diphenhydramine-acetaminophen (TYLENOL PM) 25-500 MG TABS Take 1 tablet by mouth at bedtime as needed (sleep). Reported on 12/08/2015    . ferrous sulfate 325 (65 FE) MG tablet Take 325 mg by mouth every other day.    Marland Kitchen glipiZIDE (GLUCOTROL XL) 5 MG 24 hr tablet Take 1 tablet (5 mg total) by mouth 2 (two) times daily. 180 tablet 3  . Glucosamine-Chondroitin-MSM (TRIPLE FLEX PO) Take 1 tablet by mouth daily.     . Insulin Glargine (LANTUS SOLOSTAR) 100 UNIT/ML Solostar Pen Inject 6-10 Units into the skin daily at 10 pm. May adjust as needed 15 mL 1  . Lancets MISC Use as instructed twice a day 100 each 2  . latanoprost (XALATAN) 0.005 % ophthalmic solution     . losartan-hydrochlorothiazide (HYZAAR) 100-25 MG tablet Take 1 tablet by mouth daily. 90 tablet 3  . losartan-hydrochlorothiazide (HYZAAR) 100-25 MG tablet Take 1 tablet by mouth daily. Take 1/2 tablet by mouth twice daily    . metFORMIN (GLUCOPHAGE) 1000 MG tablet TAKE 1 TABLET BY MOUTH TWICE A DAY WITH MEALS 180 tablet 3  . Multiple Vitamin (MULTIVITAMIN WITH MINERALS) TABS Take 1 tablet by mouth daily.     . Omega-3 Fatty Acids (FISH OIL BURP-LESS PO) Take by mouth daily.    . ONE TOUCH ULTRA TEST test strip USE AS INSTRUCTED TWICE A DAY 100 each 3   No current facility-administered medications on file prior to visit.     Review of Systems:  As per HPI- otherwise negative.   Physical Examination: Vitals:   12/13/17 0903  BP: (!) 149/90  Pulse: 74  Resp: 14  Temp: 97.6 F (36.4 C)  SpO2: 100%   Vitals:   12/13/17 0903  Weight: 218 lb 6.4 oz (99.1 kg)  Height:  '6\' 3"'$  (1.905 m)   Body mass index is 27.3 kg/m. Ideal Body Weight: Weight in (lb) to have BMI = 25: 199.6  GEN: WDWN, NAD, Non-toxic, A & O x 3, looks well and his normal self today HEENT: Atraumatic, Normocephalic. Neck supple. No masses, No LAD.  Bilateral TM wnl, oropharynx normal.  PEERL,EOMI.   Ears and Nose: No external deformity. CV: RRR, No M/G/R. No JVD. No thrill. No extra heart sounds. PULM: CTA B, no wheezes, crackles, rhonchi. No retractions. No resp. distress. No accessory muscle use. ABD: S, NT, ND EXTR: No c/c.  He does have 1+ edema of both feet and legs to the mid shin.  He is wearing mid shin high socks which may be exacerbating his sx  NEURO Normal gait. PSYCH: Normally interactive. Conversant. Not depressed or anxious appearing.  Calm demeanor.  Foot exam today- ok except his toenails are thickened and in some cases too long   Assessment and Plan: Uncontrolled type 2 diabetes mellitus without complication, with long-term current use of insulin ([place])  Essential hypertension  Need for immunization against influenza - Plan: Flu vaccine HIGH DOSE PF (Fluzone High dose)  Enlarged and hypertrophic nails  Recent A1c at the [place] was fine- he is going to send me this report Continue current medication regimen BP is well controlled today Flu shot given He does not want to see podiatry if he can help it as he does not think this service is covered by his insurance.  Suggested that he try a good nail shop that is comfortable with diabetic care to help him trim his nails    Signed Lamar Blinks, MD

## 2017-12-11 NOTE — H&P (View-Only) (Signed)
    HPI Michael Wiggins returns today for ongoing evaluation and management of his PPM to discuss upgrade to a BiV PPM. He has a h/o CHB and is s/p DDD PM inseriton. He has had worsening of his sob and prevents to discuss biv upgrade. He has had class 2 CHF sympotms. He has pacing induced LBBB.  Allergies  Allergen Reactions  . Amlodipine Swelling  . Crestor [Rosuvastatin] Other (See Comments)    Per pt unable to focus     Current Outpatient Medications  Medication Sig Dispense Refill  . Albuterol Sulfate 108 (90 Base) MCG/ACT AEPB Inhale 2 puffs into the lungs every 6 (six) hours as needed. Take 2 puffs 10 minutes before walking also. 1 each 0  . atorvastatin (LIPITOR) 40 MG tablet Take 1/2 tablet by mouth twice daily    . azithromycin (ZITHROMAX) 250 MG tablet Take 2 tablets by mouth on day 1, followed by 1 tablet by mouth daily for 4 days. 6 tablet 0  . B-D UF III MINI PEN NEEDLES 31G X 5 MM MISC USE AS DIRECTED EVERY DAY 100 each 0  . B-D UF III MINI PEN NEEDLES 31G X 5 MM MISC USE AS DIRECTED EVERY DAY 100 each 2  . benzonatate (TESSALON) 100 MG capsule Take 1 capsule (100 mg total) by mouth 3 (three) times daily as needed for cough. 21 capsule 0  . budesonide-formoterol (SYMBICORT) 80-4.5 MCG/ACT inhaler Inhale 2 puffs into the lungs 2 (two) times daily. 1 Inhaler 3  . carvedilol (COREG) 25 MG tablet TAKE 1 TABLET BY MOUTH TWICE A DAY 180 tablet 0  . diphenhydramine-acetaminophen (TYLENOL PM) 25-500 MG TABS Take 1 tablet by mouth at bedtime as needed (sleep). Reported on 12/08/2015    . ferrous sulfate 325 (65 FE) MG tablet Take 325 mg by mouth every other day.    . glipiZIDE (GLUCOTROL XL) 5 MG 24 hr tablet Take 1 tablet (5 mg total) by mouth 2 (two) times daily. 180 tablet 3  . Glucosamine-Chondroitin-MSM (TRIPLE FLEX PO) Take 1 tablet by mouth daily.     . Lancets MISC Use as instructed twice a day 100 each 2  . latanoprost (XALATAN) 0.005 % ophthalmic solution     .  losartan-hydrochlorothiazide (HYZAAR) 100-25 MG tablet Take 1 tablet by mouth daily. 90 tablet 3  . losartan-hydrochlorothiazide (HYZAAR) 100-25 MG tablet Take 1 tablet by mouth daily. Take 1/2 tablet by mouth twice daily    . metFORMIN (GLUCOPHAGE) 1000 MG tablet TAKE 1 TABLET BY MOUTH TWICE A DAY WITH MEALS 180 tablet 3  . Multiple Vitamin (MULTIVITAMIN WITH MINERALS) TABS Take 1 tablet by mouth daily.     . Omega-3 Fatty Acids (FISH OIL BURP-LESS PO) Take by mouth daily.    . ONE TOUCH ULTRA TEST test strip USE AS INSTRUCTED TWICE A DAY 100 each 3  . Insulin Glargine (LANTUS SOLOSTAR) 100 UNIT/ML Solostar Pen Inject 6-10 Units into the skin daily at 10 pm. May adjust as needed 15 mL 1   No current facility-administered medications for this visit.      Past Medical History:  Diagnosis Date  . Anemia   . Cataract   . Diabetes mellitus   . GERD (gastroesophageal reflux disease)   . Hyperlipidemia   . Hypertension   . MGUS (monoclonal gammopathy of unknown significance)   . Multiple myeloma ([facility]) 02/2013   normal cytogenetics and FISH panel on 03/11/2013.   . Osteoarthritis   . Right bundle   branch block   . Syncope     ROS:   All systems reviewed and negative except as noted in the HPI.   Past Surgical History:  Procedure Laterality Date  . EP IMPLANTABLE DEVICE N/A 08/09/2015   Procedure: Pacemaker Implant;  Surgeon: Ameir Faria W Cadince Hilscher, MD;  Location: [facility];  Service: Cardiovascular;  Laterality: N/A;  . EYE SURGERY       Family History  Problem Relation Age of Onset  . Hypertension Mother   . Cancer Mother   . Stroke Mother   . Hypertension Sister   . Hypertension Brother   . Hypertension Maternal Grandmother      Social History   Socioeconomic History  . Marital status: Married    Spouse name: Not on file  . Number of children: Not on file  . Years of education: Not on file  . Highest education level: Not on file  Social Needs  . Financial resource  strain: Not on file  . Food insecurity - worry: Not on file  . Food insecurity - inability: Not on file  . Transportation needs - medical: Not on file  . Transportation needs - non-medical: Not on file  Occupational History  . Not on file  Tobacco Use  . Smoking status: Former Smoker    Packs/day: 2.00    Years: 35.00    Pack years: 70.00    Types: Cigarettes    Start date: 03/26/1963    Last attempt to quit: 12/18/1997    Years since quitting: 19.9  . Smokeless tobacco: Never Used  . Tobacco comment: quit 15 years  Substance and Sexual Activity  . Alcohol use: No    Alcohol/week: 0.0 oz  . Drug use: No  . Sexual activity: Yes    Birth control/protection: None  Other Topics Concern  . Not on file  Social History Narrative   Married   Building Maintenance     BP 124/76   Pulse 80   Ht 6' 3" (1.905 m)   Wt 228 lb (103.4 kg)   SpO2 97%   BMI 28.50 kg/m   Physical Exam:  stable appearing 71 yo man, NAD HEENT: Unremarkable Neck:  7 cm JVD, no thyromegally Lymphatics:  No adenopathy Back:  No CVA tenderness Lungs:  Clear with no wheezes HEART:  Regular rate rhythm, no murmurs, no rubs, no clicks Abd:  soft, positive bowel sounds, no organomegally, no rebound, no guarding Ext:  2 plus pulses, no edema, no cyanosis, no clubbing Skin:  No rashes no nodules Neuro:  CN II through XII intact, motor grossly intact  EKG - reviewed old ECG. NSR with pacing induced LBBB  Assess/Plan: 1. Pacing induced LBBB - He has a QRS of 200. We will plan upgrade to a BiV PPM.  2. Chronic systolic heart failure - his symptoms are class 3. I have asked that the patient reduce his sodium intake and continue his current meds.  Krystol Rocco,M.D. 

## 2017-12-11 NOTE — Progress Notes (Signed)
HPI [name] returns today for ongoing evaluation and management of his PPM to discuss upgrade to a BiV PPM. He has a h/o CHB and is s/p DDD PM inseriton. He has had worsening of his sob and prevents to discuss biv upgrade. He has had class 2 CHF sympotms. He has pacing induced LBBB.  Allergies  Allergen Reactions  . Amlodipine Swelling  . Crestor [Rosuvastatin] Other (See Comments)    Per pt unable to focus     Current Outpatient Medications  Medication Sig Dispense Refill  . Albuterol Sulfate 108 (90 Base) MCG/ACT AEPB Inhale 2 puffs into the lungs every 6 (six) hours as needed. Take 2 puffs 10 minutes before walking also. 1 each 0  . atorvastatin (LIPITOR) 40 MG tablet Take 1/2 tablet by mouth twice daily    . azithromycin (ZITHROMAX) 250 MG tablet Take 2 tablets by mouth on day 1, followed by 1 tablet by mouth daily for 4 days. 6 tablet 0  . B-D UF III MINI PEN NEEDLES 31G X 5 MM MISC USE AS DIRECTED EVERY DAY 100 each 0  . B-D UF III MINI PEN NEEDLES 31G X 5 MM MISC USE AS DIRECTED EVERY DAY 100 each 2  . benzonatate (TESSALON) 100 MG capsule Take 1 capsule (100 mg total) by mouth 3 (three) times daily as needed for cough. 21 capsule 0  . budesonide-formoterol (SYMBICORT) 80-4.5 MCG/ACT inhaler Inhale 2 puffs into the lungs 2 (two) times daily. 1 Inhaler 3  . carvedilol (COREG) 25 MG tablet TAKE 1 TABLET BY MOUTH TWICE A DAY 180 tablet 0  . diphenhydramine-acetaminophen (TYLENOL PM) 25-500 MG TABS Take 1 tablet by mouth at bedtime as needed (sleep). Reported on 12/08/2015    . ferrous sulfate 325 (65 FE) MG tablet Take 325 mg by mouth every other day.    Marland Kitchen glipiZIDE (GLUCOTROL XL) 5 MG 24 hr tablet Take 1 tablet (5 mg total) by mouth 2 (two) times daily. 180 tablet 3  . Glucosamine-Chondroitin-MSM (TRIPLE FLEX PO) Take 1 tablet by mouth daily.     . Lancets MISC Use as instructed twice a day 100 each 2  . latanoprost (XALATAN) 0.005 % ophthalmic solution     .  losartan-hydrochlorothiazide (HYZAAR) 100-25 MG tablet Take 1 tablet by mouth daily. 90 tablet 3  . losartan-hydrochlorothiazide (HYZAAR) 100-25 MG tablet Take 1 tablet by mouth daily. Take 1/2 tablet by mouth twice daily    . metFORMIN (GLUCOPHAGE) 1000 MG tablet TAKE 1 TABLET BY MOUTH TWICE A DAY WITH MEALS 180 tablet 3  . Multiple Vitamin (MULTIVITAMIN WITH MINERALS) TABS Take 1 tablet by mouth daily.     . Omega-3 Fatty Acids (FISH OIL BURP-LESS PO) Take by mouth daily.    . ONE TOUCH ULTRA TEST test strip USE AS INSTRUCTED TWICE A DAY 100 each 3  . Insulin Glargine (LANTUS SOLOSTAR) 100 UNIT/ML Solostar Pen Inject 6-10 Units into the skin daily at 10 pm. May adjust as needed 15 mL 1   No current facility-administered medications for this visit.      Past Medical History:  Diagnosis Date  . Anemia   . Cataract   . Diabetes mellitus   . GERD (gastroesophageal reflux disease)   . Hyperlipidemia   . Hypertension   . MGUS (monoclonal gammopathy of unknown significance)   . Multiple myeloma ([place]) 02/2013   normal cytogenetics and FISH panel on 03/11/2013.   . Osteoarthritis   . Right bundle  branch block   . Syncope     ROS:   All systems reviewed and negative except as noted in the HPI.   Past Surgical History:  Procedure Laterality Date  . EP IMPLANTABLE DEVICE N/A 08/09/2015   Procedure: Pacemaker Implant;  Surgeon: [name], MD;  Location: [facility];  Service: Cardiovascular;  Laterality: N/A;  . EYE SURGERY       Family History  Problem Relation Age of Onset  . Hypertension Mother   . Cancer Mother   . Stroke Mother   . Hypertension Sister   . Hypertension Brother   . Hypertension Maternal Grandmother      Social History   Socioeconomic History  . Marital status: Married    Spouse name: Not on file  . Number of children: Not on file  . Years of education: Not on file  . Highest education level: Not on file  Social Needs  . Financial resource  strain: Not on file  . Food insecurity - worry: Not on file  . Food insecurity - inability: Not on file  . Transportation needs - medical: Not on file  . Transportation needs - non-medical: Not on file  Occupational History  . Not on file  Tobacco Use  . Smoking status: Former Smoker    Packs/day: 2.00    Years: 35.00    Pack years: 70.00    Types: Cigarettes    Start date: 03/26/1963    Last attempt to quit: 12/18/1997    Years since quitting: 19.9  . Smokeless tobacco: Never Used  . Tobacco comment: quit 15 years  Substance and Sexual Activity  . Alcohol use: No    Alcohol/week: 0.0 oz  . Drug use: No  . Sexual activity: Yes    Birth control/protection: None  Other Topics Concern  . Not on file  Social History Narrative   Married   Building Maintenance     BP 124/76   Pulse 80   Ht _0  (1.905 m)   Wt 228 lb (103.4 kg)   SpO2 97%   BMI 28.50 kg/m   Physical Exam:  stable appearing 71 yo man, NAD HEENT: Unremarkable Neck:  7 cm JVD, no thyromegally Lymphatics:  No adenopathy Back:  No CVA tenderness Lungs:  Clear with no wheezes HEART:  Regular rate rhythm, no murmurs, no rubs, no clicks Abd:  soft, positive bowel sounds, no organomegally, no rebound, no guarding Ext:  2 plus pulses, no edema, no cyanosis, no clubbing Skin:  No rashes no nodules Neuro:  CN II through XII intact, motor grossly intact  EKG - reviewed old ECG. NSR with pacing induced LBBB  Assess/Plan: 1. Pacing induced LBBB - He has a QRS of 200. We will plan upgrade to a BiV PPM.  2. Chronic systolic heart failure - his symptoms are class 3. I have asked that the patient reduce his sodium intake and continue his current meds.  Mikle [email or web address].D.

## 2017-12-13 ENCOUNTER — Ambulatory Visit (INDEPENDENT_AMBULATORY_CARE_PROVIDER_SITE_OTHER): Payer: Medicare Other | Admitting: Family Medicine

## 2017-12-13 ENCOUNTER — Encounter: Payer: Self-pay | Admitting: Family Medicine

## 2017-12-13 VITALS — BP 140/88 | HR 74 | Temp 97.6°F | Resp 14 | Ht 75.0 in | Wt 218.4 lb

## 2017-12-13 DIAGNOSIS — Z23 Encounter for immunization: Secondary | ICD-10-CM | POA: Diagnosis not present

## 2017-12-13 DIAGNOSIS — Z794 Long term (current) use of insulin: Secondary | ICD-10-CM | POA: Diagnosis not present

## 2017-12-13 DIAGNOSIS — IMO0001 Reserved for inherently not codable concepts without codable children: Secondary | ICD-10-CM

## 2017-12-13 DIAGNOSIS — E1165 Type 2 diabetes mellitus with hyperglycemia: Secondary | ICD-10-CM | POA: Diagnosis not present

## 2017-12-13 DIAGNOSIS — Q845 Enlarged and hypertrophic nails: Secondary | ICD-10-CM | POA: Diagnosis not present

## 2017-12-13 DIAGNOSIS — I10 Essential (primary) hypertension: Secondary | ICD-10-CM

## 2017-12-13 NOTE — Patient Instructions (Addendum)
Always a pleasure to see you- best of luck with your pacer change procedure, I hope that this will increase your energy level Please bring by a copy of your recnet A1c test from the [place] if you can.  Otherwise you are scheduled to have labs in just over a week, so we will not do other blood work today  You may want to use some knee high compression socks to help prevent swelling of your feet- put these on in the morning before you have a chance to swell  Take care and please see me in about 3 months for a recheck- sooner if you need anything.    You may want to consider having professional toenail care to help trim and maintain your nails- this can be done by some nail shops, or we can have you see a podiatrist.  Take care!

## 2017-12-26 ENCOUNTER — Other Ambulatory Visit: Payer: Medicare Other | Admitting: *Deleted

## 2017-12-26 DIAGNOSIS — R0602 Shortness of breath: Secondary | ICD-10-CM | POA: Diagnosis not present

## 2017-12-26 DIAGNOSIS — I5022 Chronic systolic (congestive) heart failure: Secondary | ICD-10-CM

## 2017-12-26 DIAGNOSIS — Z950 Presence of cardiac pacemaker: Secondary | ICD-10-CM

## 2017-12-26 DIAGNOSIS — I447 Left bundle-branch block, unspecified: Secondary | ICD-10-CM | POA: Diagnosis not present

## 2017-12-26 DIAGNOSIS — I10 Essential (primary) hypertension: Secondary | ICD-10-CM | POA: Diagnosis not present

## 2017-12-26 DIAGNOSIS — Z01812 Encounter for preprocedural laboratory examination: Secondary | ICD-10-CM

## 2017-12-27 LAB — BASIC METABOLIC PANEL
BUN/Creatinine Ratio: 20 (ref 10–24)
BUN: 21 mg/dL (ref 8–27)
CO2: 24 mmol/L (ref 20–29)
CREATININE: 1.06 mg/dL (ref 0.76–1.27)
Calcium: 9.3 mg/dL (ref 8.6–10.2)
Chloride: 99 mmol/L (ref 96–106)
GFR calc Af Amer: 81 mL/min/{1.73_m2} (ref >59)
GFR calc non Af Amer: 70 mL/min/{1.73_m2} (ref >59)
Glucose: 121 mg/dL — ABNORMAL HIGH (ref 65–99)
Potassium: 4.3 mmol/L (ref 3.5–5.2)
SODIUM: 135 mmol/L (ref 134–144)

## 2017-12-27 LAB — CBC WITH DIFFERENTIAL/PLATELET
Basophils Absolute: 0.1 10*3/uL (ref 0.0–0.2)
Basos: 1 %
EOS (ABSOLUTE): 0.5 10*3/uL — AB (ref 0.0–0.4)
EOS: 7 %
Hematocrit: 34.2 % — ABNORMAL LOW (ref 37.5–51.0)
Hemoglobin: 11 g/dL — ABNORMAL LOW (ref 13.0–17.7)
IMMATURE GRANULOCYTES: 0 %
Immature Grans (Abs): 0 10*3/uL (ref 0.0–0.1)
Lymphocytes Absolute: 2.1 10*3/uL (ref 0.7–3.1)
Lymphs: 30 %
MCH: 27.8 pg (ref 26.6–33.0)
MCHC: 32.2 g/dL (ref 31.5–35.7)
MCV: 87 fL (ref 79–97)
MONOS ABS: 0.6 10*3/uL (ref 0.1–0.9)
Monocytes: 8 %
Neutrophils Absolute: 3.8 10*3/uL (ref 1.4–7.0)
Neutrophils: 54 %
PLATELETS: 291 10*3/uL (ref 150–379)
RBC: 3.95 x10E6/uL — ABNORMAL LOW (ref 4.14–5.80)
RDW: 15.5 % — ABNORMAL HIGH (ref 12.3–15.4)
WBC: 7.1 10*3/uL (ref 3.4–10.8)

## 2017-12-27 LAB — LIPID PANEL
Chol/HDL Ratio: 4.4 ratio (ref 0.0–5.0)
Cholesterol, Total: 151 mg/dL (ref 100–199)
HDL: 34 mg/dL — ABNORMAL LOW (ref >39)
LDL Calculated: 104 mg/dL — ABNORMAL HIGH (ref 0–99)
Triglycerides: 64 mg/dL (ref 0–149)
VLDL CHOLESTEROL CAL: 13 mg/dL (ref 5–40)

## 2017-12-28 ENCOUNTER — Telehealth: Payer: Self-pay

## 2017-12-28 NOTE — Telephone Encounter (Signed)
error 

## 2017-12-31 ENCOUNTER — Other Ambulatory Visit: Payer: Self-pay | Admitting: Internal Medicine

## 2017-12-31 ENCOUNTER — Other Ambulatory Visit: Payer: Medicare Other

## 2017-12-31 ENCOUNTER — Ambulatory Visit (INDEPENDENT_AMBULATORY_CARE_PROVIDER_SITE_OTHER): Payer: Medicare Other | Admitting: Pharmacist

## 2017-12-31 VITALS — BP 126/76 | HR 74

## 2017-12-31 DIAGNOSIS — I10 Essential (primary) hypertension: Secondary | ICD-10-CM | POA: Diagnosis not present

## 2017-12-31 DIAGNOSIS — E782 Mixed hyperlipidemia: Secondary | ICD-10-CM

## 2017-12-31 MED ORDER — ATORVASTATIN CALCIUM 20 MG PO TABS: 20.0000 mg | ORAL_TABLET | Freq: Every day | ORAL | 3 refills | Status: DC

## 2017-12-31 MED ORDER — CARVEDILOL 25 MG PO TABS: 25.0000 mg | ORAL_TABLET | Freq: Two times a day (BID) | ORAL | 3 refills | Status: DC

## 2017-12-31 NOTE — Patient Instructions (Signed)
It was nice to see you today  Continue taking your medications as you have been  Call the blood pressure clinic with any concerns or if your blood pressure stays elevated above > 130/80

## 2017-12-31 NOTE — Progress Notes (Signed)
Patient ID: Michael Gianino Sr.                 DOB: May 09, 1946                      MRN: [id number]     HPI: Michael Wigginsis a very pleasant71 y.o.malepatient referred to pharmacy clinic by Dr Lovena Le. PMH is significant forHLD, DM, HTN, multiple myeloma, MGUS, GERD, and right bundle branch block. Pt was recently started on atorvastatin 36m daily due to LDL of 1953mdL, with most recent LDL controlled at 10432mL. He was advised to start amlodipine for BP control, however never picked up his prescription because he doesn't like to take medications. He wanted to focus on lifestyle improvements and pt was encouraged to drastically cut back caffeine intake since he was drinking 10 cups of coffee each day.  Pt presents today in good spirits. He has been taking Lipitor 21m50mther than 40mg79mly because he felt like he couldn't focus when he took 40mg.35mhas checked his BP occasionally and reports highest reading of 145/90 and lowest reading of 131/78. He has had a few random episodes of dizziness over the last month, not associated with any particular BP readings.  He has been eating less fried food and more raw veggies and fruit. He still adds some salt to his food but states that he is adding less than he used to. He still drinks 10 cups of coffee each day but states that he only uses 9 scoops of coffee grounds and that half of them are decaf.  Current HTN meds:carvedilol 25mg B51mlosartan-HCTZ 100mg-255maily BP goal:<130/80 mmHg  Current Medications:atorvastatin 40mg dai37makes 21mg BID)30molerances:Crestor - unable to focus, Lipitor 10mg and 224mdaily-53ms not recall adverse events Risk Factors:DM, LDL > 190 LDL goal:<100mg/dL  Die31meakfast - oatmeal with fruit or 1 whole egg with egg whites with veggies and cheese, turkey sausagKuwaitheat toast. Lunch - leftovers from breakfast or protein shake. Dinner - fried chicken and fried okra, green beans, brown rice, baked  potatoes. Dessert - sugar free cookies or sugar free apple pie. Drinks - 6 bottles of 16 oz of water each day. Drinks diet caffeine free Pepsi and 10 cups of coffee each day (1/2 caffeinated 1/2 non caffeinated).  Exercise:Walking 45 minutes every morning - brisk walk for 2.5 miles.  Family History:Mother with HTN, stroke, and cancer. Sister, brother, and maternal grandmother with HTNand 3 strokes.  Social History:Former smoker 2 PPD for 35 years, quit in 1999. Denies alcohol and illicit drug use.  Labs:  08/2016: TC 244, HDL 33.6, TG 190, LDL 172(no lipid lowering therapy)  Wt Readings from Last 3 Encounters:  12/13/17 218 lb 6.4 oz (99.1 kg)  12/07/17 228 lb (103.4 kg)  11/16/17 231 lb 3.2 oz (104.9 kg)   BP Readings from Last 3 Encounters:  12/13/17 140/88  12/07/17 124/76  11/29/17 140/84   Pulse Readings from Last 3 Encounters:  12/13/17 74  12/07/17 80  11/29/17 77    Renal function: CrCl cannot be calculated (Unknown ideal weight.).  Past Medical History:  Diagnosis Date  . Anemia   . Cataract   . Diabetes mellitus   . GERD (gastroesophageal reflux disease)   . Hyperlipidemia   . Hypertension   . MGUS (monoclonal gammopathy of unknown significance)   . Multiple myeloma (HCC) 02/2013  [facility] cytogenetics and FISH panel on 03/11/2013.   . Osteoarthritis   .  Right bundle branch block   . Syncope     Current Outpatient Medications on File Prior to Visit  Medication Sig Dispense Refill  . atorvastatin (LIPITOR) 40 MG tablet Take 20 mg by mouth daily.     . B-D UF III MINI PEN NEEDLES 31G X 5 MM MISC USE AS DIRECTED EVERY DAY 100 each 0  . B-D UF III MINI PEN NEEDLES 31G X 5 MM MISC USE AS DIRECTED EVERY DAY 100 each 2  . carvedilol (COREG) 25 MG tablet TAKE 1 TABLET BY MOUTH TWICE A DAY 180 tablet 0  . ferrous sulfate 325 (65 FE) MG tablet Take 325 mg by mouth every other day.    . Ginkgo Biloba (GINKOBA) 40 MG TABS Take 1 tablet by mouth every other  day.    Marland Kitchen glipiZIDE (GLUCOTROL XL) 5 MG 24 hr tablet Take 1 tablet (5 mg total) by mouth 2 (two) times daily. 180 tablet 3  . Insulin Glargine (LANTUS SOLOSTAR) 100 UNIT/ML Solostar Pen Inject 6-10 Units into the skin daily at 10 pm. May adjust as needed 15 mL 1  . Lancets MISC Use as instructed twice a day 100 each 2  . latanoprost (XALATAN) 0.005 % ophthalmic solution Place 1 drop into both eyes at bedtime.     Marland Kitchen losartan-hydrochlorothiazide (HYZAAR) 100-25 MG tablet Take 1 tablet by mouth daily. 90 tablet 3  . metFORMIN (GLUCOPHAGE) 1000 MG tablet TAKE 1 TABLET BY MOUTH TWICE A DAY WITH MEALS 180 tablet 3  . Multiple Vitamin (MULTIVITAMIN WITH MINERALS) TABS Take 1 tablet by mouth daily.     . Omega-3 Fatty Acids (FISH OIL BURP-LESS PO) Take 1 capsule by mouth every other day.     . ONE TOUCH ULTRA TEST test strip USE AS INSTRUCTED TWICE A DAY 100 each 3   No current facility-administered medications on file prior to visit.     Allergies  Allergen Reactions  . Amlodipine Swelling  . Crestor [Rosuvastatin] Other (See Comments)    Per pt unable to focus     Assessment/Plan:  1. Hypertension - BP is improved and at goal <130/52mHg. Will continue carvedilol 244mBID and losartan-HCTZ 100-2578maily. Encouraged pt to limit caffeine and salt intake. Advised pt to call clinic if home readings become consistently elevated. F/u as needed.  2. Hyperlipidemia - Pt has been taking Lipitor 45m65mther than 40mg36mause the higher dose caused issues with concentration. LDL has improved from 172 to 104. Will continue Lipitor 45mg 49my for LDL goal < 100 due to diabetes.   Sparkles Mcneely E. Kiearra Oyervides, PharmD, CPP, BCACP [id number] [id number]: [phone] 9:43 AM

## 2018-01-03 ENCOUNTER — Telehealth: Payer: Self-pay

## 2018-01-03 NOTE — Telephone Encounter (Signed)
Spoke with Pt.  Pt will arrive at 10:00 am for procedure tomorrow.

## 2018-01-04 ENCOUNTER — Ambulatory Visit (HOSPITAL_COMMUNITY)
Admission: RE | Admit: 2018-01-04 | Discharge: 2018-01-05 | Disposition: A | Discharge status: Home / Self Care | Payer: Medicare Other | Source: Ambulatory Visit | Attending: Internal Medicine | Admitting: Internal Medicine

## 2018-01-04 ENCOUNTER — Other Ambulatory Visit: Payer: Self-pay

## 2018-01-04 ENCOUNTER — Encounter (HOSPITAL_COMMUNITY): Payer: Self-pay | Admitting: General Practice

## 2018-01-04 ENCOUNTER — Encounter (HOSPITAL_COMMUNITY)
Admission: RE | Disposition: A | Discharge status: Home / Self Care | Payer: Self-pay | Source: Ambulatory Visit | Attending: Internal Medicine

## 2018-01-04 DIAGNOSIS — Z950 Presence of cardiac pacemaker: Secondary | ICD-10-CM

## 2018-01-04 DIAGNOSIS — Z8589 Personal history of malignant neoplasm of other organs and systems: Secondary | ICD-10-CM | POA: Diagnosis not present

## 2018-01-04 DIAGNOSIS — E785 Hyperlipidemia, unspecified: Secondary | ICD-10-CM | POA: Diagnosis not present

## 2018-01-04 DIAGNOSIS — I442 Atrioventricular block, complete: Secondary | ICD-10-CM | POA: Diagnosis not present

## 2018-01-04 DIAGNOSIS — Z794 Long term (current) use of insulin: Secondary | ICD-10-CM | POA: Insufficient documentation

## 2018-01-04 DIAGNOSIS — I110 Hypertensive heart disease with heart failure: Secondary | ICD-10-CM | POA: Diagnosis not present

## 2018-01-04 DIAGNOSIS — M1990 Unspecified osteoarthritis, unspecified site: Secondary | ICD-10-CM | POA: Insufficient documentation

## 2018-01-04 DIAGNOSIS — Z79899 Other long term (current) drug therapy: Secondary | ICD-10-CM | POA: Diagnosis not present

## 2018-01-04 DIAGNOSIS — I5022 Chronic systolic (congestive) heart failure: Secondary | ICD-10-CM | POA: Diagnosis not present

## 2018-01-04 DIAGNOSIS — I429 Cardiomyopathy, unspecified: Secondary | ICD-10-CM | POA: Diagnosis not present

## 2018-01-04 DIAGNOSIS — Z87891 Personal history of nicotine dependence: Secondary | ICD-10-CM | POA: Diagnosis not present

## 2018-01-04 DIAGNOSIS — E119 Type 2 diabetes mellitus without complications: Secondary | ICD-10-CM | POA: Diagnosis not present

## 2018-01-04 DIAGNOSIS — I447 Left bundle-branch block, unspecified: Secondary | ICD-10-CM | POA: Insufficient documentation

## 2018-01-04 DIAGNOSIS — Z45018 Encounter for adjustment and management of other part of cardiac pacemaker: Secondary | ICD-10-CM | POA: Insufficient documentation

## 2018-01-04 HISTORY — DX: Chronic systolic (congestive) heart failure: I50.22

## 2018-01-04 HISTORY — DX: Presence of cardiac pacemaker: Z95.0

## 2018-01-04 HISTORY — PX: BIV UPGRADE: EP1202

## 2018-01-04 LAB — GLUCOSE, CAPILLARY
GLUCOSE-CAPILLARY: 214 mg/dL — AB (ref 65–99)
Glucose-Capillary: 121 mg/dL — ABNORMAL HIGH (ref 65–99)

## 2018-01-04 LAB — SURGICAL PCR SCREEN
MRSA, PCR: NEGATIVE
STAPHYLOCOCCUS AUREUS: NEGATIVE

## 2018-01-04 SURGERY — BIV UPGRADE

## 2018-01-04 MED ORDER — YOU HAVE A PACEMAKER BOOK
Freq: Once | Status: AC
  Administered 2018-01-05: 03:00:00
  Filled 2018-01-04: qty 1

## 2018-01-04 MED ORDER — SODIUM CHLORIDE 0.9 % IR SOLN
80.0000 mg | Status: AC
  Administered 2018-01-04: 80 mg

## 2018-01-04 MED ORDER — INSULIN GLARGINE 100 UNIT/ML ~~LOC~~ SOLN
6.0000 [IU] | Freq: Every day | SUBCUTANEOUS | Status: DC
  Administered 2018-01-04: 6 [IU] via SUBCUTANEOUS
  Filled 2018-01-04: qty 0.06

## 2018-01-04 MED ORDER — ADULT MULTIVITAMIN W/MINERALS CH
1.0000 | ORAL_TABLET | Freq: Every day | ORAL | Status: DC
  Administered 2018-01-04 – 2018-01-05 (×2): 1 via ORAL
  Filled 2018-01-04 (×2): qty 1

## 2018-01-04 MED ORDER — IOPAMIDOL (ISOVUE-370) INJECTION 76%
INTRAVENOUS | Status: DC | PRN
  Administered 2018-01-04: 30 mL

## 2018-01-04 MED ORDER — MUPIROCIN 2 % EX OINT
TOPICAL_OINTMENT | CUTANEOUS | Status: AC
  Administered 2018-01-04: 1 via TOPICAL
  Filled 2018-01-04: qty 22

## 2018-01-04 MED ORDER — LOSARTAN POTASSIUM-HCTZ 100-25 MG PO TABS: 1.0000 | ORAL_TABLET | Freq: Every day | ORAL | Status: DC

## 2018-01-04 MED ORDER — SODIUM CHLORIDE 0.9 % IR SOLN
Status: AC
  Filled 2018-01-04: qty 2

## 2018-01-04 MED ORDER — CEFAZOLIN SODIUM-DEXTROSE 2-4 GM/100ML-% IV SOLN
INTRAVENOUS | Status: AC
  Filled 2018-01-04: qty 100

## 2018-01-04 MED ORDER — LOSARTAN POTASSIUM 50 MG PO TABS
100.0000 mg | ORAL_TABLET | Freq: Every day | ORAL | Status: DC
  Administered 2018-01-04 – 2018-01-05 (×2): 100 mg via ORAL
  Filled 2018-01-04 (×2): qty 2

## 2018-01-04 MED ORDER — LATANOPROST 0.005 % OP SOLN
1.0000 [drp] | Freq: Every day | OPHTHALMIC | Status: DC
  Administered 2018-01-04: 21:00:00 1 [drp] via OPHTHALMIC
  Filled 2018-01-04: qty 2.5

## 2018-01-04 MED ORDER — METFORMIN HCL 500 MG PO TABS: 1000.0000 mg | ORAL_TABLET | Freq: Two times a day (BID) | ORAL | Status: DC

## 2018-01-04 MED ORDER — GLIPIZIDE ER 5 MG PO TB24
5.0000 mg | ORAL_TABLET | Freq: Two times a day (BID) | ORAL | Status: DC
  Administered 2018-01-04 – 2018-01-05 (×2): 5 mg via ORAL
  Filled 2018-01-04 (×2): qty 1

## 2018-01-04 MED ORDER — CEFAZOLIN SODIUM-DEXTROSE 1-4 GM/50ML-% IV SOLN
1.0000 g | Freq: Four times a day (QID) | INTRAVENOUS | Status: AC
  Administered 2018-01-04 – 2018-01-05 (×3): 1 g via INTRAVENOUS
  Filled 2018-01-04 (×3): qty 50

## 2018-01-04 MED ORDER — INSULIN GLARGINE 100 UNIT/ML SOLOSTAR PEN: 6.0000 [IU] | PEN_INJECTOR | Freq: Every day | SUBCUTANEOUS | Status: DC

## 2018-01-04 MED ORDER — HEPARIN (PORCINE) IN NACL 2-0.9 UNIT/ML-% IJ SOLN
INTRAMUSCULAR | Status: AC | PRN
  Administered 2018-01-04: 500 mL

## 2018-01-04 MED ORDER — ACETAMINOPHEN 325 MG PO TABS: 325.0000 mg | ORAL_TABLET | ORAL | Status: DC | PRN

## 2018-01-04 MED ORDER — CARVEDILOL 12.5 MG PO TABS
25.0000 mg | ORAL_TABLET | Freq: Two times a day (BID) | ORAL | Status: DC
  Administered 2018-01-04 – 2018-01-05 (×2): 25 mg via ORAL
  Filled 2018-01-04 (×2): qty 2

## 2018-01-04 MED ORDER — MIDAZOLAM HCL 5 MG/5ML IJ SOLN
INTRAMUSCULAR | Status: AC
  Filled 2018-01-04: qty 5

## 2018-01-04 MED ORDER — CEFAZOLIN SODIUM-DEXTROSE 2-4 GM/100ML-% IV SOLN
2.0000 g | INTRAVENOUS | Status: AC
  Administered 2018-01-04: 2 g via INTRAVENOUS

## 2018-01-04 MED ORDER — IOPAMIDOL (ISOVUE-370) INJECTION 76%
INTRAVENOUS | Status: AC
  Filled 2018-01-04: qty 50

## 2018-01-04 MED ORDER — MUPIROCIN 2 % EX OINT
1.0000 "application " | TOPICAL_OINTMENT | Freq: Once | CUTANEOUS | Status: AC
  Administered 2018-01-04: 1 via TOPICAL

## 2018-01-04 MED ORDER — GINKGO BILOBA 40 MG PO TABS: 1.0000 | ORAL_TABLET | ORAL | Status: DC

## 2018-01-04 MED ORDER — ONDANSETRON HCL 4 MG/2ML IJ SOLN: 4.0000 mg | Freq: Four times a day (QID) | INTRAMUSCULAR | Status: DC | PRN

## 2018-01-04 MED ORDER — FERROUS SULFATE 325 (65 FE) MG PO TABS
325.0000 mg | ORAL_TABLET | ORAL | Status: DC
  Administered 2018-01-05: 08:00:00 325 mg via ORAL
  Filled 2018-01-04 (×2): qty 1

## 2018-01-04 MED ORDER — HYDROCHLOROTHIAZIDE 25 MG PO TABS
25.0000 mg | ORAL_TABLET | Freq: Every day | ORAL | Status: DC
  Administered 2018-01-04 – 2018-01-05 (×2): 25 mg via ORAL
  Filled 2018-01-04 (×2): qty 1

## 2018-01-04 MED ORDER — MIDAZOLAM HCL 5 MG/5ML IJ SOLN
INTRAMUSCULAR | Status: DC | PRN
  Administered 2018-01-04: 1 mg via INTRAVENOUS
  Administered 2018-01-04: 2 mg via INTRAVENOUS
  Administered 2018-01-04: 1 mg via INTRAVENOUS
  Administered 2018-01-04: 2 mg via INTRAVENOUS

## 2018-01-04 MED ORDER — CHLORHEXIDINE GLUCONATE 4 % EX LIQD: 60.0000 mL | Freq: Once | CUTANEOUS | Status: DC

## 2018-01-04 MED ORDER — FENTANYL CITRATE (PF) 100 MCG/2ML IJ SOLN
INTRAMUSCULAR | Status: DC | PRN
  Administered 2018-01-04 (×3): 25 ug via INTRAVENOUS

## 2018-01-04 MED ORDER — SODIUM CHLORIDE 0.9 % IV SOLN
INTRAVENOUS | Status: DC
  Administered 2018-01-04: 12:00:00 via INTRAVENOUS

## 2018-01-04 SURGICAL SUPPLY — 16 items
ALLURE CRT PM3262 (Pacemaker) ×3 IMPLANT
CABLE SURGICAL S-101-97-12 (CABLE) ×3 IMPLANT
CATH CPS DIRECT 135 DS2C020 (CATHETERS) ×3 IMPLANT
CATH HEX JOS 2-5-2 65CM 6F REP (CATHETERS) ×3 IMPLANT
CPS IMPLANT KIT 410190 (MISCELLANEOUS) ×3 IMPLANT
GUIDEWIRE ANGLED .035X150CM (WIRE) ×3 IMPLANT
KIT ESSENTIALS PG (KITS) ×3 IMPLANT
LEAD QUARTET 1458Q-86CM (Lead) ×3 IMPLANT
PACEMAKER ALLURE CRT (Pacemaker) ×1 IMPLANT
PAD DEFIB LIFELINK (PAD) ×3 IMPLANT
SHEATH CLASSIC 9.5F (SHEATH) ×3 IMPLANT
SHEATH PINNACLE 6F 10CM (SHEATH) ×3 IMPLANT
SLITTER UNIVERSAL DS2A003 (MISCELLANEOUS) ×3 IMPLANT
TRAY PACEMAKER INSERTION (PACKS) ×3 IMPLANT
WIRE AMPLATZ SSTIFF .035X260CM (WIRE) ×3 IMPLANT
WIRE MAILMAN 182CM (WIRE) ×3 IMPLANT

## 2018-01-04 NOTE — Progress Notes (Signed)
PHARMACIST - PHYSICIAN ORDER COMMUNICATION  CONCERNING: P&T Medication Policy on Herbal Medications  DESCRIPTION:  This patient's order for:  Ginkgo Biloba 40 mg  has been noted.  This product(s) is classified as an "herbal" or natural product. Due to a lack of definitive safety studies or FDA approval, nonstandard manufacturing practices, plus the potential risk of unknown drug-drug interactions while on inpatient medications, the Pharmacy and Therapeutics Committee does not permit the use of "herbal" or natural products of this type within [facility].   ACTION TAKEN: The pharmacy department is unable to verify this order at this time and your patient has been informed of this safety policy. Please reevaluate patient's clinical condition at discharge and address if the herbal or natural product(s) should be resumed at that time.

## 2018-01-05 ENCOUNTER — Ambulatory Visit (HOSPITAL_COMMUNITY): Payer: Medicare Other

## 2018-01-05 DIAGNOSIS — E785 Hyperlipidemia, unspecified: Secondary | ICD-10-CM | POA: Diagnosis not present

## 2018-01-05 DIAGNOSIS — I442 Atrioventricular block, complete: Secondary | ICD-10-CM

## 2018-01-05 DIAGNOSIS — E119 Type 2 diabetes mellitus without complications: Secondary | ICD-10-CM | POA: Diagnosis not present

## 2018-01-05 DIAGNOSIS — Z45018 Encounter for adjustment and management of other part of cardiac pacemaker: Secondary | ICD-10-CM | POA: Diagnosis not present

## 2018-01-05 DIAGNOSIS — Z79899 Other long term (current) drug therapy: Secondary | ICD-10-CM | POA: Diagnosis not present

## 2018-01-05 DIAGNOSIS — Z87891 Personal history of nicotine dependence: Secondary | ICD-10-CM | POA: Diagnosis not present

## 2018-01-05 DIAGNOSIS — I5022 Chronic systolic (congestive) heart failure: Secondary | ICD-10-CM | POA: Diagnosis not present

## 2018-01-05 DIAGNOSIS — I429 Cardiomyopathy, unspecified: Secondary | ICD-10-CM | POA: Diagnosis not present

## 2018-01-05 DIAGNOSIS — Z794 Long term (current) use of insulin: Secondary | ICD-10-CM | POA: Diagnosis not present

## 2018-01-05 DIAGNOSIS — Z8589 Personal history of malignant neoplasm of other organs and systems: Secondary | ICD-10-CM | POA: Diagnosis not present

## 2018-01-05 DIAGNOSIS — I110 Hypertensive heart disease with heart failure: Secondary | ICD-10-CM | POA: Diagnosis not present

## 2018-01-05 DIAGNOSIS — I10 Essential (primary) hypertension: Secondary | ICD-10-CM | POA: Diagnosis not present

## 2018-01-05 DIAGNOSIS — M1990 Unspecified osteoarthritis, unspecified site: Secondary | ICD-10-CM | POA: Diagnosis not present

## 2018-01-05 DIAGNOSIS — I447 Left bundle-branch block, unspecified: Secondary | ICD-10-CM | POA: Diagnosis not present

## 2018-01-05 LAB — GLUCOSE, CAPILLARY: GLUCOSE-CAPILLARY: 120 mg/dL — AB (ref 65–99)

## 2018-01-05 NOTE — Discharge Instructions (Signed)
° ° °  Supplemental Discharge Instructions for  Pacemaker Patients  Activity No heavy lifting or vigorous activity with your left/right arm for 6 to 8 weeks.  Do not raise your left/right arm above your head for one week.     NO DRIVING for 1 week   WOUND CARE - Keep the wound area clean and dry.  Do not get this area wet for one week. No showers for one week; you may shower in 7 days - The tape/steri-strips on your wound will fall off; do not pull them off.  No bandage is needed on the site.  DO  NOT apply any creams, oils, or ointments to the wound area. - If you notice any drainage or discharge from the wound, any swelling or bruising at the site, or you develop a fever > 101? F after you are discharged home, call the office at once.  Special Instructions - You are still able to use cellular telephones; use the ear opposite the side where you have your pacemaker.  Avoid carrying your cellular phone near your device. - When traveling through airports, show security personnel your identification card to avoid being screened in the metal detectors.  Ask the security personnel to use the hand wand. - Avoid arc welding equipment, MRI testing (magnetic resonance imaging), TENS units (transcutaneous nerve stimulators).  Call the office for questions about other devices. - Avoid electrical appliances that are in poor condition or are not properly grounded. - Microwave ovens are safe to be near or to operate.

## 2018-01-05 NOTE — Discharge Summary (Signed)
ELECTROPHYSIOLOGY PROCEDURE DISCHARGE SUMMARY    Patient ID: Michael Buhl Sr.,  MRN: [id number], DOB/AGE: Jun 05, 1946 72 y.o.  Admit date: 01/04/2018 Discharge date: 01/05/2018  Primary Care Physician:  Darreld Mclean, MD  Electrophysiologist: Dr Lovena Le  Primary Discharge Diagnosis:  1. Nonischemic cardiomyopathy  Secondary Discharge Diagnosis:  1. Complete heart block 2. Hypertension   Allergies  Allergen Reactions  . Amlodipine Swelling  . Crestor [Rosuvastatin] Other (See Comments)    Per pt unable to focus     Procedures This Admission:  1.  Implantation of a St Jude medical Allure Crt [id number] BiV pacemaker by Dr Lovena Le on 01/04/18.  This was an upgrade of a previously placed dual chamber PPM.  There were no immediate post procedure complications. 2.  CXR on 01/05/18 demonstrated no pneumothorax status post device implantation.   Brief HPI: Michael Komatsu Sr. is a 72 y.o. male has a history of complete heart block and syncope.  With RV pacing, he has developed a nonischemic cardiomyopathy.  He has been treated with medical therapy without improvement in EF.   Risks, benefits, and alternatives to CRT-P upgrade were reviewed with the patient who wished to proceed.   Hospital Course:  The patient was admitted and underwent implantation of a Archer pacemaker with details as outlined above. He was monitored on telemetry overnight which demonstrated sinus rhythm with V pacing.  Left chest was without hematoma or ecchymosis.  The device was interrogated and found to be functioning normally.  CXR was obtained and demonstrated no pneumothorax status post device implantation.  Wound care, arm mobility, and restrictions were reviewed with the patient.  The patient was examined and considered stable for discharge to home.   The patient's discharge medications include an ACE-I and beta blocker.   Physical Exam: Vitals:   01/04/18 2331 01/05/18 0337 01/05/18 0404  01/05/18 0755  BP: (!) 162/94 (!) 170/103 (!) 158/103 (!) 171/103  Pulse: 66 77 70 80  Resp: 14 20 14 17   Temp:  98.2 F (36.8 C)  98 F (36.7 C)  TempSrc:  Oral  Oral  SpO2: 98% 96% 97% 97%  Weight:   213 lb 13.5 oz (97 kg)   Height:        GEN- The patient is well appearing, alert and oriented x 3 today.   HEENT: normocephalic, atraumatic; sclera clear, conjunctiva pink; hearing intact; oropharynx clear; neck supple, no JVP Lymph- no cervical lymphadenopathy Lungs- Clear to ausculation bilaterally, normal work of breathing.  No wheezes, rales, rhonchi Heart- Regular rate and rhythm, no murmurs, rubs or gallops, PMI not laterally displaced GI- soft, non-tender, non-distended, bowel sounds present, no hepatosplenomegaly Extremities- no clubbing, cyanosis, or edema; DP/PT/radial pulses 2+ bilaterally MS- no significant deformity or atrophy Skin- warm and dry, no rash or lesion, left chest without hematoma/ecchymosis Psych- euthymic mood, full affect Neuro- strength and sensation are intact   Labs:   Lab Results  Component Value Date   WBC 7.1 12/26/2017   HGB 11.0 (L) 12/26/2017   HCT 34.2 (L) 12/26/2017   MCV 87 12/26/2017   PLT 291 12/26/2017   No results for input(s): NA, K, CL, CO2, BUN, CREATININE, CALCIUM, PROT, BILITOT, ALKPHOS, ALT, AST, GLUCOSE in the last 168 hours.  Invalid input(s): LABALBU  Discharge Medications:  Allergies as of 01/05/2018      Reactions   Amlodipine Swelling   Crestor [rosuvastatin] Other (See Comments)   Per pt unable to focus  Medication List    TAKE these medications   atorvastatin 20 MG tablet Commonly known as:  LIPITOR Take 1 tablet (20 mg total) by mouth daily.   B-D UF III MINI PEN NEEDLES 31G X 5 MM Misc Generic drug:  Insulin Pen Needle USE AS DIRECTED EVERY DAY   B-D UF III MINI PEN NEEDLES 31G X 5 MM Misc Generic drug:  Insulin Pen Needle USE AS DIRECTED EVERY DAY   carvedilol 25 MG tablet Commonly known as:   COREG Take 1 tablet (25 mg total) by mouth 2 (two) times daily.   ferrous sulfate 325 (65 FE) MG tablet Take 325 mg by mouth every other day.   FISH OIL BURP-LESS PO Take 1 capsule by mouth every other day.   GINKOBA 40 MG Tabs Generic drug:  Ginkgo Biloba Take 1 tablet by mouth every other day.   glipiZIDE 5 MG 24 hr tablet Commonly known as:  GLUCOTROL XL Take 1 tablet (5 mg total) by mouth 2 (two) times daily.   Insulin Glargine 100 UNIT/ML Solostar Pen Commonly known as:  LANTUS SOLOSTAR Inject 6-10 Units into the skin daily at 10 pm. May adjust as needed   Lancets Misc Use as instructed twice a day   latanoprost 0.005 % ophthalmic solution Commonly known as:  XALATAN Place 1 drop into both eyes at bedtime.   losartan-hydrochlorothiazide 100-25 MG tablet Commonly known as:  HYZAAR Take 1 tablet by mouth daily.   metFORMIN 1000 MG tablet Commonly known as:  GLUCOPHAGE TAKE 1 TABLET BY MOUTH TWICE A DAY WITH MEALS   multivitamin with minerals Tabs tablet Take 1 tablet by mouth daily.   ONE TOUCH ULTRA TEST test strip Generic drug:  glucose blood USE AS INSTRUCTED TWICE A DAY       Disposition:   Follow-up Information    [place] Office Follow up on 01/14/2018.   Specialty:  Cardiology Why:  9:00AM, wound check visit Contact information: [address]       [name], MD Follow up on 04/05/2018.   Specialty:  Cardiology Why:  9:15AM Contact information: [address] [address] [phone]           Duration of Discharge Encounter: Greater than 30 minutes including physician time.  Army Fossa MD 01/05/2018 8:54 AM

## 2018-01-07 ENCOUNTER — Encounter (HOSPITAL_COMMUNITY): Payer: Self-pay | Admitting: Internal Medicine

## 2018-01-07 NOTE — Interval H&P Note (Signed)
History and Physical Interval Note:  01/07/2018 10:34 PM  Michael Mons Sr.  has presented today for surgery, with the diagnosis of hf - left bundle branch block  The various methods of treatment have been discussed with the patient and family. After consideration of risks, benefits and other options for treatment, the patient has consented to  Procedure(s): BIVP UPGRADE (N/A) as a surgical intervention .  The patient's history has been reviewed, patient examined, no change in status, stable for surgery.  I have reviewed the patient's chart and labs.  Questions were answered to the patient's satisfaction.     Cristopher Peru

## 2018-01-08 ENCOUNTER — Telehealth: Payer: Self-pay | Admitting: Internal Medicine

## 2018-01-08 NOTE — Telephone Encounter (Signed)
New message  Pt verbalized that she is calling for the RN  Can he drive

## 2018-01-08 NOTE — Telephone Encounter (Signed)
Informed pt that he was ok to drive as long as he wasn't taking any narcotics pt denied taking narcotics, informed to be mindful of the seatbelt rubbing the site and place a towel or seatbelt cover to help protect the incision site. Pt voiced understanding.

## 2018-01-09 ENCOUNTER — Encounter (HOSPITAL_COMMUNITY): Payer: Self-pay | Admitting: Internal Medicine

## 2018-01-14 ENCOUNTER — Ambulatory Visit (INDEPENDENT_AMBULATORY_CARE_PROVIDER_SITE_OTHER): Payer: Medicare Other | Admitting: *Deleted

## 2018-01-14 DIAGNOSIS — Z950 Presence of cardiac pacemaker: Secondary | ICD-10-CM

## 2018-01-14 DIAGNOSIS — I441 Atrioventricular block, second degree: Secondary | ICD-10-CM | POA: Diagnosis not present

## 2018-01-14 DIAGNOSIS — I5022 Chronic systolic (congestive) heart failure: Secondary | ICD-10-CM | POA: Diagnosis not present

## 2018-01-14 LAB — CUP PACEART INCLINIC DEVICE CHECK
Battery Voltage: 3.04 V
Brady Statistic RA Percent Paced: 2.3 %
Brady Statistic RV Percent Paced: 99 %
Date Time Interrogation Session: 20190128134034
Implantable Lead Implant Date: 20160822
Implantable Lead Location: 753859
Implantable Lead Location: 753860
Implantable Pulse Generator Implant Date: 20190118
Lead Channel Impedance Value: 537.5 Ohm
Lead Channel Pacing Threshold Amplitude: 0.75 V
Lead Channel Pacing Threshold Amplitude: 1.25 V
Lead Channel Pacing Threshold Pulse Width: 0.5 ms
Lead Channel Pacing Threshold Pulse Width: 0.5 ms
Lead Channel Pacing Threshold Pulse Width: 0.5 ms
Lead Channel Setting Pacing Amplitude: 1.875
Lead Channel Setting Pacing Amplitude: 2.375
Lead Channel Setting Pacing Pulse Width: 0.5 ms
Lead Channel Setting Sensing Sensitivity: 4 mV
MDC IDC LEAD IMPLANT DT: 20160822
MDC IDC LEAD IMPLANT DT: 20190118
MDC IDC LEAD LOCATION: 753858
MDC IDC MSMT BATTERY REMAINING LONGEVITY: 99 mo
MDC IDC MSMT LEADCHNL LV IMPEDANCE VALUE: 725 Ohm
MDC IDC MSMT LEADCHNL LV PACING THRESHOLD AMPLITUDE: 1.25 V
MDC IDC MSMT LEADCHNL RA IMPEDANCE VALUE: 400 Ohm
MDC IDC MSMT LEADCHNL RA SENSING INTR AMPL: 1.5 mV
MDC IDC SET LEADCHNL LV PACING AMPLITUDE: 2.25 V
MDC IDC SET LEADCHNL LV PACING PULSEWIDTH: 0.5 ms
Pulse Gen Model: 3262
Pulse Gen Serial Number: 8983138

## 2018-01-14 NOTE — Progress Notes (Signed)
Wound check appointment. Steri-strips removed. Wound without redness or edema. Incision edges approximated, wound well healed. Normal device function. Thresholds, sensing, and impedances consistent with implant measurements. Device programmed with auto capture programmed on for extra safety margin until 3 month visit. Histogram distribution appropriate for patient and level of activity. BiV pacing 99%. No mode switches or high ventricular rates noted. Patient educated about wound care, arm mobility, lifting restrictions, and Merlin monitor. ROV with GT on 04/05/18.

## 2018-01-15 DIAGNOSIS — H524 Presbyopia: Secondary | ICD-10-CM | POA: Diagnosis not present

## 2018-03-01 ENCOUNTER — Encounter (HOSPITAL_BASED_OUTPATIENT_CLINIC_OR_DEPARTMENT_OTHER): Payer: Self-pay

## 2018-03-01 ENCOUNTER — Ambulatory Visit: Payer: Self-pay | Admitting: *Deleted

## 2018-03-01 ENCOUNTER — Other Ambulatory Visit: Payer: Self-pay

## 2018-03-01 ENCOUNTER — Emergency Department (HOSPITAL_BASED_OUTPATIENT_CLINIC_OR_DEPARTMENT_OTHER)
Admission: EM | Admit: 2018-03-01 | Discharge: 2018-03-01 | Disposition: A | Discharge status: Home / Self Care | Payer: Medicare Other | Attending: Emergency Medicine | Admitting: Emergency Medicine

## 2018-03-01 DIAGNOSIS — Y929 Unspecified place or not applicable: Secondary | ICD-10-CM | POA: Diagnosis not present

## 2018-03-01 DIAGNOSIS — I5022 Chronic systolic (congestive) heart failure: Secondary | ICD-10-CM | POA: Insufficient documentation

## 2018-03-01 DIAGNOSIS — Z794 Long term (current) use of insulin: Secondary | ICD-10-CM | POA: Insufficient documentation

## 2018-03-01 DIAGNOSIS — Z79899 Other long term (current) drug therapy: Secondary | ICD-10-CM | POA: Insufficient documentation

## 2018-03-01 DIAGNOSIS — Y9389 Activity, other specified: Secondary | ICD-10-CM | POA: Insufficient documentation

## 2018-03-01 DIAGNOSIS — Y999 Unspecified external cause status: Secondary | ICD-10-CM | POA: Insufficient documentation

## 2018-03-01 DIAGNOSIS — Z87891 Personal history of nicotine dependence: Secondary | ICD-10-CM | POA: Insufficient documentation

## 2018-03-01 DIAGNOSIS — E119 Type 2 diabetes mellitus without complications: Secondary | ICD-10-CM | POA: Diagnosis not present

## 2018-03-01 DIAGNOSIS — S6981XA Other specified injuries of right wrist, hand and finger(s), initial encounter: Secondary | ICD-10-CM | POA: Diagnosis present

## 2018-03-01 DIAGNOSIS — Z950 Presence of cardiac pacemaker: Secondary | ICD-10-CM | POA: Diagnosis not present

## 2018-03-01 DIAGNOSIS — S61431A Puncture wound without foreign body of right hand, initial encounter: Secondary | ICD-10-CM

## 2018-03-01 DIAGNOSIS — I110 Hypertensive heart disease with heart failure: Secondary | ICD-10-CM | POA: Insufficient documentation

## 2018-03-01 DIAGNOSIS — W450XXA Nail entering through skin, initial encounter: Secondary | ICD-10-CM | POA: Insufficient documentation

## 2018-03-01 DIAGNOSIS — S61441A Puncture wound with foreign body of right hand, initial encounter: Secondary | ICD-10-CM | POA: Diagnosis not present

## 2018-03-01 MED ORDER — CEPHALEXIN 500 MG PO CAPS: 500.0000 mg | ORAL_CAPSULE | Freq: Four times a day (QID) | ORAL | 0 refills | Status: DC

## 2018-03-01 MED ORDER — BACITRACIN ZINC 500 UNIT/GM EX OINT
TOPICAL_OINTMENT | Freq: Two times a day (BID) | CUTANEOUS | Status: DC
  Administered 2018-03-01: 16:00:00 via TOPICAL
  Filled 2018-03-01: qty 28.35

## 2018-03-01 NOTE — ED Provider Notes (Signed)
[facility] EMERGENCY DEPARTMENT Provider Note   CSN: [id number] Arrival date & time: 03/01/18  1513     History   Chief Complaint Chief Complaint  Patient presents with  . Hand Injury    HPI Michael Sedivy Sr. is a 72 y.o. male with past medical history of CHF, diabetes, pacemaker placement, who presents to ED for evaluation of puncture wound to nondominant right palm that occurred approximately 2 hours prior to arrival.  He was crawling through a crawl space outside when his right palm was punctured by the nail.  He believes the nail was about 1-1/2 inches deep into his hand.  He immediately removed from the nail did have some bleeding.  He ran under some water with bleeding controlled.  He called his primary care provider and was told to come to the ED.  He denies any significant pain in the area.  Denies any other injuries.  Last tetanus was 4 years ago.  Denies any fevers, drainage from site, changes in sensation of hand.  He reports compliance with his home diabetes medications and insulin.  HPI  Past Medical History:  Diagnosis Date  . Anemia   . Cataract   . Chronic systolic (congestive) heart failure ([place])   . Diabetes mellitus   . GERD (gastroesophageal reflux disease)   . Hyperlipidemia   . Hypertension   . MGUS (monoclonal gammopathy of unknown significance)   . Multiple myeloma ([facility]) 02/2013   normal cytogenetics and FISH panel on 03/11/2013.   . Osteoarthritis   . Presence of permanent cardiac pacemaker   . Right bundle branch block   . Syncope     Patient Active Problem List   Diagnosis Date Noted  . Chronic systolic (congestive) heart failure ([facility]) 01/04/2018  . Pacemaker 12/08/2015  . Mobitz type 2 second degree heart block 08/08/2015  . Right bundle branch block 12/16/2014  . Syncope 12/16/2014  . Proteinuria due to type 2 diabetes mellitus ([facility]) 11/12/2014  . Encounter for therapeutic drug monitoring 03/13/2013  . Multiple myeloma ([facility])  02/15/2013  . Edema 01/07/2013  . Diabetes mellitus due to underlying condition with other diabetic kidney complication ([facility])   . Hyperlipidemia   . Hypertension   . MGUS (monoclonal gammopathy of unknown significance)   . GERD (gastroesophageal reflux disease)     Past Surgical History:  Procedure Laterality Date  . BIV UPGRADE  01/04/2018  . BIV UPGRADE N/A 01/04/2018   Procedure: BIVP UPGRADE;  Surgeon: [name], MD;  Location: [facility];  Service: Cardiovascular;  Laterality: N/A;  . EP IMPLANTABLE DEVICE N/A 08/09/2015   Procedure: Pacemaker Implant;  Surgeon: [name], MD;  Location: [facility];  Service: Cardiovascular;  Laterality: N/A;  . EYE SURGERY         Home Medications    Prior to Admission medications   Medication Sig Start Date End Date Taking? Authorizing Provider  atorvastatin (LIPITOR) 20 MG tablet Take 1 tablet (20 mg total) by mouth daily. 12/31/17   [name], MD  B-D UF III MINI PEN NEEDLES 31G X 5 MM MISC USE AS DIRECTED EVERY DAY 03/13/17   [name], [name], MD  B-D UF III MINI PEN NEEDLES 31G X 5 MM MISC USE AS DIRECTED EVERY DAY 07/12/17   [name], [name], MD  carvedilol (COREG) 25 MG tablet Take 1 tablet (25 mg total) by mouth 2 (two) times daily. 12/31/17   [name], MD  cephALEXin (KEFLEX) 500 MG capsule Take 1 capsule (500 mg total) by mouth 4 (four) times daily. 03/01/18   Prakash Kimberling, PA-C  ferrous sulfate 325 (65 FE) MG tablet Take 325 mg by mouth every other day.    [provider]  Ginkgo Biloba (GINKOBA) 40 MG TABS Take 1 tablet by mouth every other day.    [provider]  glipiZIDE (GLUCOTROL XL) 5 MG 24 hr tablet Take 1 tablet (5 mg total) by mouth 2 (two) times daily. 06/17/17   [name], [name], MD  Insulin Glargine (LANTUS SOLOSTAR) 100 UNIT/ML Solostar Pen Inject 6-10 Units into the skin daily at 10 pm. May adjust as needed 12/10/17   [name], [name], MD  Lancets MISC Use as  instructed twice a day 01/31/14   Collene Leyden, PA-C  latanoprost (XALATAN) 0.005 % ophthalmic solution Place 1 drop into both eyes at bedtime.  03/17/17   [provider]  losartan-hydrochlorothiazide (HYZAAR) 100-25 MG tablet Take 1 tablet by mouth daily. 10/04/17   [name], MD  metFORMIN (GLUCOPHAGE) 1000 MG tablet TAKE 1 TABLET BY MOUTH TWICE A DAY WITH MEALS 07/06/17   [name], [name], MD  Multiple Vitamin (MULTIVITAMIN WITH MINERALS) TABS Take 1 tablet by mouth daily.     [provider]  Omega-3 Fatty Acids (FISH OIL BURP-LESS PO) Take 1 capsule by mouth every other day.     [provider]  ONE TOUCH ULTRA TEST test strip USE AS INSTRUCTED TWICE A DAY 10/23/17   [name], [name], MD    Family History Family History  Problem Relation Age of Onset  . Hypertension Mother   . Cancer Mother   . Stroke Mother   . Hypertension Sister   . Hypertension Brother   . Hypertension Maternal Grandmother     Social History Social History   Tobacco Use  . Smoking status: Former Smoker    Packs/day: 2.00    Years: 35.00    Pack years: 70.00    Types: Cigarettes    Start date: 03/26/1963    Last attempt to quit: 12/18/1997    Years since quitting: 20.2  . Smokeless tobacco: Never Used  . Tobacco comment: quit 15 years  Substance Use Topics  . Alcohol use: No    Alcohol/week: 0.0 oz  . Drug use: No     Allergies   Amlodipine and Crestor [rosuvastatin]   Review of Systems Review of Systems  Constitutional: Negative for chills and fever.  Musculoskeletal: Negative for myalgias.  Skin: Positive for wound.     Physical Exam Updated Vital Signs BP (!) 172/98 (BP Location: Left Arm)   Pulse 68   Temp 98 F (36.7 C) (Oral)   Resp 16   Ht '6\' 3"'$  (1.905 m)   Wt 98.9 kg (218 lb)   SpO2 100%   BMI 27.25 kg/m   Physical Exam  Constitutional: He appears well-developed and well-nourished. No distress.  Nontoxic appearing and in no acute  distress. Pleasant.  HENT:  Head: Normocephalic and atraumatic.  Eyes: Conjunctivae and EOM are normal. No scleral icterus.  Neck: Normal range of motion.  Pulmonary/Chest: Effort normal. No respiratory distress.  Neurological: He is alert.  Skin: Laceration noted. No rash noted. He is not diaphoretic.  Puncture wound noted to the right palm as indicated in the image.  Bleeding controlled at this time.  Sensation intact to light touch of hand with equal grip strength noted bilaterally.  2+ radial  pulse noted.  Full active and passive range of motion of digits and wrist.  Psychiatric: He has a normal mood and affect.  Nursing note and vitals reviewed.      ED Treatments / Results  Labs (all labs ordered are listed, but only abnormal results are displayed) Labs Reviewed - No data to display  EKG  EKG Interpretation None       Radiology No results found.  Procedures Procedures (including critical care time)  Medications Ordered in ED Medications  bacitracin ointment ( Topical Given 03/01/18 1559)     Initial Impression / Assessment and Plan / ED Course  I have reviewed the triage vital signs and the nursing notes.  Pertinent labs & imaging results that were available during my care of the patient were reviewed by me and considered in my medical decision making (see chart for details).     Patient presents to ED for evaluation of puncture wound with a nail that occurred on right palm 2 hours prior to arrival while crawling through a crawl space outside without gloves.  He believes the nail was about 1-1/2 inches deep into his hand.  He removed his hand from the nail and bleeding was controlled after washing out.  He was told by PCP to come here. Physical exam findings as noted in the image above. Full AROM and PROM of digits, and wrist, and normal sensation noted. 2+ radial pulse noted. Overall well appearing. Will give Keflex for antibiotic prophylaxis, complete wound care  and antibiotic ointment here and advised same at home.  Will give number for hand specialist to follow-up with if symptoms persist.  Low suspicion for infection based on timing of injury.  Patient appears stable for discharge at this time.  Strict return precautions given. Patient discussed with and seen by Dr. Maryan Rued.  Portions of this note were generated with L[employer]. Dictation errors may occur despite best attempts at proofreading.   Final Clinical Impressions(s) / ED Diagnoses   Final diagnoses:  Puncture wound of right hand without foreign body, initial encounter    ED Discharge Orders        Ordered    cephALEXin (KEFLEX) 500 MG capsule  4 times daily     03/01/18 1600       Delia Heady, PA-C 03/01/18 1608    Blanchie Dessert, MD 03/01/18 1611

## 2018-03-01 NOTE — ED Triage Notes (Signed)
Puncture wound to right palm by nail 2 hours ago. Unable to get into PMD office. Last tetanus 4 years ago.

## 2018-03-01 NOTE — Discharge Instructions (Signed)
Please read attached information regarding your condition. Take Keflex as directed.  Please complete the entire course of this medication regardless of symptom improvement. Keep area clean and apply antibiotic ointment such as Neosporin in the area. Follow-up with your primary care provider for further evaluation. Follow-up with the hand specialist listed below for further evaluation if symptoms worsen. Return to ED for worsening symptoms, numbness in hands, additional injuries, swelling of the joint.

## 2018-03-01 NOTE — Telephone Encounter (Signed)
Called in stating he was crawling under the house in the crawl space when he placed his hand down accidentally onto a board with a nail in it.   He described the wound as an inch and 1/2 inch deep and the nail was not rusty but it was a big nail.   See triage notes below.  I attempted to call the flow coordinator twice at the [facility] office where Dr. Janett Billow [name] is located but did not get an answer.    He is able to use and move his hand fine.   It's his right hand and he is left handed.   It describes it as sore but not in a lot of pain.   He is also diabetic and has a pacemaker.  I instructed him to go to the ED at [facility] and have it examined.   He did not want to go.   "I was thinking about going back to work".   I encouraged him not to do that but instead go to the ED.   He finally agreed to go.   His tetanus was in 2015.  I have routed a note to Dr. Janett Billow [name] letting her know of the incident.   Reason for Disposition . [1] High-risk adult (e.g., age > 73, osteoporosis, chronic steroid use) AND [2] still hurts  Answer Assessment - Initial Assessment Questions 1. MECHANISM: "How did the injury happen?"     I was crawling in the crawl space.  A nail was in a board and the palm of my right hand in the fleshy part went in about an inch and 1/2.    2. ONSET: "When did the injury happen?" (Minutes or hours ago)      About 2 hours ago. 3. APPEARANCE of INJURY: "What does the injury look like?"      It was a straight puncture.   The nail was pretty big.   I did bleed it out.  Not bleed much.   I pushed on it and tried to get it to bleed but it did not bleed much.  The nail was not rusty.   4. SEVERITY: "Can you use the hand normally?" "Can you bend your fingers into a ball and then fully open them?"     Yes I can open and close it fine.   The soreness is steady.    5. SIZE: For cuts, bruises, or swelling, ask: "How large is it?" (e.g., inches or centimeters;   entire hand or wrist)      About an 1/8 of an inch.   It was a big nail. 6. PAIN: "Is there pain?" If so, ask: "How bad is the pain?"  (Scale 1-10; or mild, moderate, severe)     Some pain but nothing bad 7. TETANUS: For any breaks in the skin, ask: "When was the last tetanus booster?"     Tetanus shot in 2015. 8. OTHER SYMPTOMS: "Do you have any other symptoms?"      No other problems.   9. PREGNANCY: "Is there any chance you are pregnant?" "When was your last menstrual period?"     N/A  Protocols used: HAND AND WRIST INJURY-A-AH

## 2018-03-01 NOTE — Telephone Encounter (Signed)
FYI

## 2018-03-01 NOTE — ED Notes (Signed)
Pt verbalizes understanding of d/c instructions and denies any further needs at this time. 

## 2018-03-05 ENCOUNTER — Other Ambulatory Visit: Payer: Self-pay

## 2018-03-12 NOTE — Progress Notes (Signed)
[place] [facility] [address], [place], [place] [address] [phone]  Date:  03/13/2018   Name:  Michael Knaus Sr.   DOB:  12/10/1946   MRN:  [id number]  PCP:  Darreld Mclean, MD    Chief Complaint: Follow-up (Pt here for 3 month follow up visit. )   History of Present Illness:  Michael Blyden Sr. is a 72 y.o. very pleasant male patient who presents with the following:  Follow up visit today He was in the ER about 10 days ago with a puncture wound.  Tetanus was UTD, and the wound has healed without seeming to get infected  Last seen here by myself right after Christmas  History of CHF, pacemaker, DM, MM, HTN, hyperlipidemia New pacemaker recently implanted:  Implantation of a St Jude medical Allure Crt [id number] BiV pacemaker by Dr Lovena Le on 01/04/18.  This was an upgrade of a previously placed dual chamber PPM.  There were no immediate post procedure complications. 2.  CXR on 01/05/18 demonstrated no pneumothorax status post device implantation.   He does get some of his care at the [place], so we do not always have the most up to date lab results at our fingertips Lab Results  Component Value Date   HGBA1C 8.0 (H) 06/07/2017   Needs an A1c today - not done recently at the [place], so we will order today From our last visit together:  Recent A1c at the [place] was fine- he is going to send me this report Continue current medication regimen BP is well controlled today Flu shot given He does not want to see podiatry if he can help it as he does not think this service is covered by his insurance.  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  He noted 1 low glucose int the 50s which he treated by eating  He is on glipizide 5 BID He is on lantus 6 units  Metformin twice daily   Needs to follow-up on a pulmonary CT from last summer, he would like me to go ahead and order this as opposed to getting through the [place]  IMPRESSION: Calcified granuloma RIGHT upper lobe with a few additional scattered questionable nodular foci, largest a 6 mm pleural versus subpleural nodule at the RIGHT lower lobe, recommendation below.  Initial follow-up with CT at 6-12 months is recommended to confirm persistence. If persistent, repeat CT is recommended every 2 years until 5 years of stability has been established. This recommendation follows the consensus statement: Guidelines for Management of Incidental Pulmonary Nodules Detected on CT Images: From the Fleischner Society 2017; Radiology 2017; 284:228-243.  BP Readings from Last 3 Encounters:  03/13/18 (!) 154/88  03/01/18 (!) 172/98  01/05/18 (!) 171/103   His home BP readings are often 123/76 in the afternoon, but higher in the morning  He takes his losartan/ hctz in the morning Coreg BID He notes that his  urine stream is not as good at night- he may have to get up several times to urinate.  His stream seems to be better during the day [facility] in the last year or so, will get today  Patient Active Problem List   Diagnosis Date Noted  . Chronic systolic (congestive) heart failure ([place]) 01/04/2018  . Pacemaker 12/08/2015  . Mobitz type 2 second degree heart block 08/08/2015  . Right bundle branch block 12/16/2014  . Syncope 12/16/2014  . Proteinuria due to type 2 diabetes mellitus ([place]) 11/12/2014  . Encounter for therapeutic drug monitoring 03/13/2013  . Multiple myeloma ([place]) 02/15/2013  . Edema 01/07/2013  . Diabetes mellitus  due to underlying condition with other diabetic kidney complication ([place])   . Hyperlipidemia   . Hypertension   . MGUS (monoclonal gammopathy of unknown significance)   . GERD (gastroesophageal reflux disease)     Past Medical History:  Diagnosis Date  . Anemia   . Cataract   . Chronic systolic (congestive) heart failure ([place])   . Diabetes mellitus   . GERD (gastroesophageal reflux disease)   . Hyperlipidemia   . Hypertension   . MGUS (monoclonal gammopathy of unknown significance)   . Multiple myeloma ([place]) 02/2013   normal cytogenetics and FISH panel on 03/11/2013.   . Osteoarthritis   . Presence of permanent cardiac pacemaker   . Right bundle branch block   . Syncope     Past Surgical History:  Procedure Laterality Date  . BIV UPGRADE  01/04/2018  . BIV UPGRADE N/A 01/04/2018   Procedure: BIVP UPGRADE;  Surgeon: [name], MD;  Location: [place] [facility];  Service: Cardiovascular;  Laterality: N/A;  . EP IMPLANTABLE DEVICE N/A 08/09/2015   Procedure: Pacemaker Implant;  Surgeon: [name], MD;  Location: [place] [facility];  Service: Cardiovascular;  Laterality: N/A;  . EYE SURGERY      Social History   Tobacco Use  . Smoking status: Former Smoker    Packs/day: 2.00    Years: 35.00    Pack years: 70.00    Types: Cigarettes    Start date: 03/26/1963    Last attempt to quit: 12/18/1997    Years since quitting: 20.2  . Smokeless tobacco: Never Used  . Tobacco comment: quit 15 years  Substance Use Topics  . Alcohol use: No    Alcohol/week: 0.0 oz  . Drug use: No    Family History  Problem Relation Age of Onset  . Hypertension Mother   . Cancer Mother   . Stroke Mother   . Hypertension Sister   . Hypertension Brother   . Hypertension Maternal Grandmother     Allergies  Allergen Reactions  . Amlodipine Swelling  . Crestor [Rosuvastatin] Other (See Comments)    Per pt unable to focus    Medication list has been reviewed and updated.  Current  Outpatient Medications on File Prior to Visit  Medication Sig Dispense Refill  . atorvastatin (LIPITOR) 20 MG tablet Take 1 tablet (20 mg total) by mouth daily. 90 tablet 3  . B-D UF III MINI PEN NEEDLES 31G X 5 MM MISC USE AS DIRECTED EVERY DAY 100 each 0  . B-D UF III MINI PEN NEEDLES 31G X 5 MM MISC USE AS DIRECTED EVERY DAY 100 each 2  . carvedilol (COREG) 25 MG tablet Take 1 tablet (25 mg total) by mouth 2 (two) times daily. [place]  tablet 3  . ferrous sulfate 325 (65 FE) MG tablet Take 325 mg by mouth every other day.    . Ginkgo Biloba (GINKOBA) 40 MG TABS Take 1 tablet by mouth every other day.    Marland Kitchen glipiZIDE (GLUCOTROL XL) 5 MG 24 hr tablet Take 1 tablet (5 mg total) by mouth 2 (two) times daily. 180 tablet 3  . Insulin Glargine (LANTUS SOLOSTAR) 100 UNIT/ML Solostar Pen Inject 6-10 Units into the skin daily at 10 pm. May adjust as needed 15 mL 1  . Lancets MISC Use as instructed twice a day 100 each 2  . latanoprost (XALATAN) 0.005 % ophthalmic solution Place 1 drop into both eyes at bedtime.     Marland Kitchen losartan-hydrochlorothiazide (HYZAAR) 100-25 MG tablet Take 1 tablet by mouth daily. 90 tablet 3  . metFORMIN (GLUCOPHAGE) 1000 MG tablet TAKE 1 TABLET BY MOUTH TWICE A DAY WITH MEALS 180 tablet 3  . Multiple Vitamin (MULTIVITAMIN WITH MINERALS) TABS Take 1 tablet by mouth daily.     . Omega-3 Fatty Acids (FISH OIL BURP-LESS PO) Take 1 capsule by mouth every other day.     . ONE TOUCH ULTRA TEST test strip USE AS INSTRUCTED TWICE A DAY 100 each 3   No current facility-administered medications on file prior to visit.     Review of Systems:  As per HPI- otherwise negative.   Physical Examination: Vitals:   03/13/18 0900  BP: (!) 154/88  Pulse: 64  Temp: 97.6 F (36.4 C)  SpO2: 97%   Vitals:   03/13/18 0900  Weight: 220 lb (99.8 kg)  Height: '6\' 3"'$  (1.905 m)   Body mass index is 27.5 kg/m. Ideal Body Weight: Weight in (lb) to have BMI = 25: 199.6  GEN: WDWN, NAD, Non-toxic, A  & O x 3, normal weight, tall build HEENT: Atraumatic, Normocephalic. Neck supple. No masses, No LAD. Ears and Nose: No external deformity. CV: RRR, No M/G/R. No JVD. No thrill. No extra heart sounds. PULM: CTA B, no wheezes, crackles, rhonchi. No retractions. No resp. distress. No accessory muscle use. ABD: S, NT, ND EXTR: No c/c/e NEURO Normal gait.  PSYCH: Normally interactive. Conversant. Not depressed or anxious appearing.  Calm demeanor.  Looks well, wound on right hand has healed nicely  Assessment and Plan: Uncontrolled type 2 diabetes mellitus without complication, with long-term current use of insulin (HCC) - Plan: Hemoglobin A1c  Essential hypertension  Mixed hyperlipidemia  Cardiac pacemaker in situ  Screening for prostate cancer - Plan: PSA  Abnormal findings on diagnostic imaging of lung - Plan: CT Chest Wo Contrast  Lung nodule needing follow-up. Ordered CT for him- this actually was ordered last year, but he did not have an appt so replaced order BP- he reports fluctuating BP with some lower numbers in the afternoons Continue carvedilol and losartan/ hctz He reports a low glucose once since our last visit If A1c is low will decrease medication He is not quite sure of date of last PSA- will get for him today   Signed Lamar Blinks, MD  Received labs Results for orders placed or performed in visit on 03/13/18  PSA  Result Value Ref Range   PSA 5.31 (H) 0.10 - 4.00 ng/mL  Hemoglobin A1c  Result Value Ref Range   Hgb A1c MFr Bld 7.9 (H) 4.6 - 6.5 %   A1c is not low, will continue current regimen.  May try 8 units of lantus as long as no lows noted  PSA has gone up, needs urology referral Message to pt, urology referral placed   Lab Results  Component Value Date   PSA 5.31 (H) 03/13/2018   PSA 2.87 06/07/2015   PSA 2.88 01/20/2014     Your A1c is still a bit high, but not unreasonable for you.  Let's continue your current oral diabetes regimen, but  plan to recheck an A1c in 3-4 months and see where we are.  You might try going upon your lantus to 8 units but please watch for any low glucose readings.  If you have another low reading please alert me  I am concerned that your PSA has gone up since we last checked it.  We need to have you see urology to make sure all is well; I will refer you.  Please let me know if you don't hear about your urology appt in a week or so

## 2018-03-13 ENCOUNTER — Ambulatory Visit (INDEPENDENT_AMBULATORY_CARE_PROVIDER_SITE_OTHER): Payer: Medicare Other | Admitting: Family Medicine

## 2018-03-13 ENCOUNTER — Encounter: Payer: Self-pay | Admitting: Family Medicine

## 2018-03-13 VITALS — BP 154/88 | HR 64 | Temp 97.6°F | Ht 75.0 in | Wt 220.0 lb

## 2018-03-13 DIAGNOSIS — IMO0001 Reserved for inherently not codable concepts without codable children: Secondary | ICD-10-CM

## 2018-03-13 DIAGNOSIS — R972 Elevated prostate specific antigen [PSA]: Secondary | ICD-10-CM | POA: Diagnosis not present

## 2018-03-13 DIAGNOSIS — Z125 Encounter for screening for malignant neoplasm of prostate: Secondary | ICD-10-CM

## 2018-03-13 DIAGNOSIS — E782 Mixed hyperlipidemia: Secondary | ICD-10-CM | POA: Diagnosis not present

## 2018-03-13 DIAGNOSIS — I10 Essential (primary) hypertension: Secondary | ICD-10-CM | POA: Diagnosis not present

## 2018-03-13 DIAGNOSIS — R918 Other nonspecific abnormal finding of lung field: Secondary | ICD-10-CM | POA: Diagnosis not present

## 2018-03-13 DIAGNOSIS — Z950 Presence of cardiac pacemaker: Secondary | ICD-10-CM | POA: Diagnosis not present

## 2018-03-13 DIAGNOSIS — E1165 Type 2 diabetes mellitus with hyperglycemia: Secondary | ICD-10-CM | POA: Diagnosis not present

## 2018-03-13 DIAGNOSIS — Z794 Long term (current) use of insulin: Secondary | ICD-10-CM

## 2018-03-13 LAB — HEMOGLOBIN A1C: HEMOGLOBIN A1C: 7.9 % — AB (ref 4.6–6.5)

## 2018-03-13 LAB — PSA: PSA: 5.31 ng/mL — ABNORMAL HIGH (ref 0.10–4.00)

## 2018-03-13 NOTE — Patient Instructions (Signed)
I will be in touch with your labs- will check on your A1c and prostate level (PSA) today We will also set up a CT scan to recheck lung findings from last year  Take care, always good to see you Please continue to monitor your home BP and glucose

## 2018-03-15 ENCOUNTER — Ambulatory Visit (HOSPITAL_BASED_OUTPATIENT_CLINIC_OR_DEPARTMENT_OTHER)
Admission: RE | Admit: 2018-03-15 | Discharge: 2018-03-15 | Disposition: A | Discharge status: Home / Self Care | Payer: Medicare Other | Source: Ambulatory Visit | Attending: Family Medicine | Admitting: Family Medicine

## 2018-03-15 DIAGNOSIS — I2510 Atherosclerotic heart disease of native coronary artery without angina pectoris: Secondary | ICD-10-CM | POA: Diagnosis not present

## 2018-03-15 DIAGNOSIS — R918 Other nonspecific abnormal finding of lung field: Secondary | ICD-10-CM | POA: Diagnosis not present

## 2018-03-15 DIAGNOSIS — I700 Atherosclerosis of aorta: Secondary | ICD-10-CM | POA: Insufficient documentation

## 2018-03-16 ENCOUNTER — Encounter: Payer: Self-pay | Admitting: Family Medicine

## 2018-03-20 DIAGNOSIS — H40013 Open angle with borderline findings, low risk, bilateral: Secondary | ICD-10-CM | POA: Diagnosis not present

## 2018-03-27 DIAGNOSIS — N181 Chronic kidney disease, stage 1: Secondary | ICD-10-CM | POA: Diagnosis not present

## 2018-03-27 DIAGNOSIS — I129 Hypertensive chronic kidney disease with stage 1 through stage 4 chronic kidney disease, or unspecified chronic kidney disease: Secondary | ICD-10-CM | POA: Diagnosis not present

## 2018-03-27 DIAGNOSIS — E1122 Type 2 diabetes mellitus with diabetic chronic kidney disease: Secondary | ICD-10-CM | POA: Diagnosis not present

## 2018-03-27 DIAGNOSIS — R809 Proteinuria, unspecified: Secondary | ICD-10-CM | POA: Diagnosis not present

## 2018-03-27 DIAGNOSIS — E1129 Type 2 diabetes mellitus with other diabetic kidney complication: Secondary | ICD-10-CM | POA: Diagnosis not present

## 2018-04-03 ENCOUNTER — Inpatient Hospital Stay: Payer: Medicare Other

## 2018-04-03 ENCOUNTER — Other Ambulatory Visit: Payer: Self-pay

## 2018-04-03 ENCOUNTER — Inpatient Hospital Stay: Payer: Medicare Other | Attending: Hematology & Oncology | Admitting: Hematology & Oncology

## 2018-04-03 ENCOUNTER — Encounter: Payer: Self-pay | Admitting: Hematology & Oncology

## 2018-04-03 ENCOUNTER — Encounter: Payer: Self-pay | Admitting: *Deleted

## 2018-04-03 VITALS — BP 170/98 | HR 59 | Temp 98.0°F | Resp 20 | Wt 221.0 lb

## 2018-04-03 DIAGNOSIS — D509 Iron deficiency anemia, unspecified: Secondary | ICD-10-CM

## 2018-04-03 DIAGNOSIS — Z7984 Long term (current) use of oral hypoglycemic drugs: Secondary | ICD-10-CM | POA: Insufficient documentation

## 2018-04-03 DIAGNOSIS — E0829 Diabetes mellitus due to underlying condition with other diabetic kidney complication: Secondary | ICD-10-CM

## 2018-04-03 DIAGNOSIS — C9000 Multiple myeloma not having achieved remission: Secondary | ICD-10-CM | POA: Diagnosis not present

## 2018-04-03 DIAGNOSIS — K219 Gastro-esophageal reflux disease without esophagitis: Secondary | ICD-10-CM

## 2018-04-03 DIAGNOSIS — N289 Disorder of kidney and ureter, unspecified: Secondary | ICD-10-CM

## 2018-04-03 DIAGNOSIS — Z79899 Other long term (current) drug therapy: Secondary | ICD-10-CM

## 2018-04-03 LAB — CMP (CANCER CENTER ONLY)
ALK PHOS: 51 U/L (ref 26–84)
ALT: 31 U/L (ref 10–47)
AST: 27 U/L (ref 11–38)
Albumin: 3 g/dL — ABNORMAL LOW (ref 3.5–5.0)
Anion gap: 8 (ref 5–15)
BUN: 22 mg/dL (ref 7–22)
CALCIUM: 9.4 mg/dL (ref 8.0–10.3)
CO2: 31 mmol/L (ref 18–33)
CREATININE: 1.2 mg/dL (ref 0.60–1.20)
Chloride: 100 mmol/L (ref 98–108)
Glucose, Bld: 146 mg/dL — ABNORMAL HIGH (ref 73–118)
POTASSIUM: 3.9 mmol/L (ref 3.3–4.7)
SODIUM: 139 mmol/L (ref 128–145)
TOTAL PROTEIN: 7.3 g/dL (ref 6.4–8.1)
Total Bilirubin: 0.5 mg/dL (ref 0.2–1.6)

## 2018-04-03 LAB — CBC WITH DIFFERENTIAL (CANCER CENTER ONLY)
BASOS PCT: 0 %
Basophils Absolute: 0 10*3/uL (ref 0.0–0.1)
EOS ABS: 0.5 10*3/uL (ref 0.0–0.5)
EOS PCT: 7 %
HCT: 35.3 % — ABNORMAL LOW (ref 38.7–49.9)
Hemoglobin: 11.8 g/dL — ABNORMAL LOW (ref 13.0–17.1)
Lymphocytes Relative: 24 %
Lymphs Abs: 1.8 10*3/uL (ref 0.9–3.3)
MCH: 28.9 pg (ref 28.0–33.4)
MCHC: 33.4 g/dL (ref 32.0–35.9)
MCV: 86.3 fL (ref 82.0–98.0)
Monocytes Absolute: 0.7 10*3/uL (ref 0.1–0.9)
Monocytes Relative: 10 %
Neutro Abs: 4.3 10*3/uL (ref 1.5–6.5)
Neutrophils Relative %: 59 %
PLATELETS: 227 10*3/uL (ref 145–400)
RBC: 4.09 MIL/uL — AB (ref 4.20–5.70)
RDW: 14.8 % (ref 11.1–15.7)
WBC: 7.3 10*3/uL (ref 4.0–10.0)

## 2018-04-03 LAB — RETICULOCYTES
RBC.: 4.11 MIL/uL — AB (ref 4.20–5.82)
RETIC COUNT ABSOLUTE: 45.2 10*3/uL (ref 34.8–93.9)
Retic Ct Pct: 1.1 % (ref 0.8–1.8)

## 2018-04-03 LAB — IRON AND TIBC
Iron: 61 ug/dL (ref 42–163)
SATURATION RATIOS: 21 % — AB (ref 42–163)
TIBC: 293 ug/dL (ref 202–409)
UIBC: 232 ug/dL

## 2018-04-03 LAB — LACTATE DEHYDROGENASE: LDH: 176 U/L (ref 125–245)

## 2018-04-03 LAB — FERRITIN: Ferritin: 79 ng/mL (ref 22–316)

## 2018-04-03 NOTE — Progress Notes (Signed)
Hematology and Oncology Follow Up Visit  Michael Cahalan Sr. [id number] [birth year] 72 y.o. 04/03/2018   Principle Diagnosis:   IgG kappa smoldering myeloma  Iron deficiency anemia  Current Therapy:    Observation     Interim History:  [name] is back for followup.  We see him every 6 months.  So far, he is doing well.  He is watching his blood pressure.  He is trying to monitor his blood sugars.  He does see a nephrologist for renal insufficiency.  He is little bit sore today.  He has been doing a lot of work.  He works quite a bit.  He has had no bleeding.  There is been no change in bowel or bladder habits.  His iron studies look good last time that we saw him.  His ferritin was 89 with an iron saturation of 17%.  His myeloma studies are holding stable.  Back in October 2018, his M spike was 0.9 g/dL.  His IgG level was 1600 mg/dL.  His Kappa Lightchain was 6.1 mg/dL.   He has had no leg swelling.  He does wear some compression stockings.   He has had no fever.  He has had no problem with infections over the winter.  Overall, his performance status is ECOG 0.    Medications:  Current Outpatient Medications:  .  atorvastatin (LIPITOR) 20 MG tablet, Take 1 tablet (20 mg total) by mouth daily., Disp: 90 tablet, Rfl: 3 .  B-D UF III MINI PEN NEEDLES 31G X 5 MM MISC, USE AS DIRECTED EVERY DAY, Disp: 100 each, Rfl: 0 .  B-D UF III MINI PEN NEEDLES 31G X 5 MM MISC, USE AS DIRECTED EVERY DAY, Disp: 100 each, Rfl: 2 .  carvedilol (COREG) 25 MG tablet, Take 1 tablet (25 mg total) by mouth 2 (two) times daily., Disp: 180 tablet, Rfl: 3 .  ferrous sulfate 325 (65 FE) MG tablet, Take 325 mg by mouth every other day., Disp: , Rfl:  .  Ginkgo Biloba (GINKOBA) 40 MG TABS, Take 1 tablet by mouth every other day., Disp: , Rfl:  .  glipiZIDE (GLUCOTROL XL) 5 MG 24 hr tablet, Take 1 tablet (5 mg total) by mouth 2 (two) times daily., Disp: 180 tablet, Rfl: 3 .  Lancets MISC, Use as instructed  twice a day, Disp: 100 each, Rfl: 2 .  latanoprost (XALATAN) 0.005 % ophthalmic solution, Place 1 drop into both eyes at bedtime. , Disp: , Rfl:  .  losartan-hydrochlorothiazide (HYZAAR) 100-25 MG tablet, Take 1 tablet by mouth daily., Disp: 90 tablet, Rfl: 3 .  metFORMIN (GLUCOPHAGE) 1000 MG tablet, TAKE 1 TABLET BY MOUTH TWICE A DAY WITH MEALS, Disp: 180 tablet, Rfl: 3 .  Multiple Vitamin (MULTIVITAMIN WITH MINERALS) TABS, Take 1 tablet by mouth daily. , Disp: , Rfl:  .  Omega-3 Fatty Acids (FISH OIL BURP-LESS PO), Take 1 capsule by mouth every other day. , Disp: , Rfl:  .  ONE TOUCH ULTRA TEST test strip, USE AS INSTRUCTED TWICE A DAY, Disp: 100 each, Rfl: 3 .  Insulin Glargine (LANTUS SOLOSTAR) 100 UNIT/ML Solostar Pen, Inject 6-10 Units into the skin daily at 10 pm. May adjust as needed, Disp: 15 mL, Rfl: 1  Allergies:  Allergies  Allergen Reactions  . Amlodipine Swelling  . Crestor [Rosuvastatin] Other (See Comments)    Per pt unable to focus    Past Medical History, Surgical history, Social history, and Family History were reviewed and  updated.  Review of Systems: Review of Systems  Constitutional: Negative.   HENT: Negative.   Eyes: Negative.   Respiratory: Negative.   Cardiovascular: Negative.   Gastrointestinal: Negative.   Genitourinary: Negative.   Musculoskeletal: Negative.   Skin: Negative.   Neurological: Negative.   Endo/Heme/Allergies: Negative.   Psychiatric/Behavioral: Negative.      Physical Exam:  weight is 221 lb (100.2 kg). His oral temperature is 98 F (36.7 C). His blood pressure is 170/98 (abnormal) and his pulse is 59 (abnormal). His respiration is 20 and oxygen saturation is 100%.   Physical Exam  Constitutional: He is oriented to person, place, and time.  HENT:  Head: Normocephalic and atraumatic.  Mouth/Throat: Oropharynx is clear and moist.  Eyes: Pupils are equal, round, and reactive to light. EOM are normal.  Neck: Normal range of motion.   Cardiovascular: Normal rate, regular rhythm and normal heart sounds.  Pulmonary/Chest: Effort normal and breath sounds normal.  Abdominal: Soft. Bowel sounds are normal.  Musculoskeletal: Normal range of motion. He exhibits no edema, tenderness or deformity.  Lymphadenopathy:    He has no cervical adenopathy.  Neurological: He is alert and oriented to person, place, and time.  Skin: Skin is warm and dry. No rash noted. No erythema.  Psychiatric: He has a normal mood and affect. His behavior is normal. Judgment and thought content normal.  Vitals reviewed.     Lab Results  Component Value Date   WBC 7.3 04/03/2018   HGB 11.0 (L) 12/26/2017   HCT 35.3 (L) 04/03/2018   MCV 86.3 04/03/2018   PLT 227 04/03/2018     Chemistry      Component Value Date/Time   NA 139 04/03/2018 0852   NA 135 12/26/2017 0847   NA 142 10/03/2017 0910   NA 137 04/03/2017 0852   K 3.9 04/03/2018 0852   K 3.9 10/03/2017 0910   K 4.2 04/03/2017 0852   CL 100 04/03/2018 0852   CL 103 10/03/2017 0910   CL 108 (H) 05/15/2013 0912   CO2 31 04/03/2018 0852   CO2 28 10/03/2017 0910   CO2 26 04/03/2017 0852   BUN 22 04/03/2018 0852   BUN 21 12/26/2017 0847   BUN 13 10/03/2017 0910   BUN 16.3 04/03/2017 0852   CREATININE 1.20 04/03/2018 0852   CREATININE 1.07 11/16/2017 1437   CREATININE 1.0 04/03/2017 0852      Component Value Date/Time   CALCIUM 9.4 04/03/2018 0852   CALCIUM 9.3 10/03/2017 0910   CALCIUM 9.0 04/03/2017 0852   ALKPHOS 51 04/03/2018 0852   ALKPHOS 51 10/03/2017 0910   ALKPHOS 46 04/03/2017 0852   AST 27 04/03/2018 0852   AST 18 04/03/2017 0852   ALT 31 04/03/2018 0852   ALT 34 10/03/2017 0910   ALT 19 04/03/2017 0852   BILITOT 0.5 04/03/2018 0852   BILITOT 0.38 04/03/2017 0852         Impression and Plan: [name] is 72 year old gentleman with IgG kappa smoldering myeloma. So far, I do not see any evidence of progressive disease.   We will see what his myeloma  numbers look like today. I would be surprised if there is much change.  Again, he is totally asymptomatic.  We will plan to get him back to see [place] in another 6 months.  Volanda Napoleon, MD 4/17/20199:39 AM

## 2018-04-04 LAB — IGG, IGA, IGM
IGA: 331 mg/dL (ref 61–437)
IGG (IMMUNOGLOBIN G), SERUM: 1890 mg/dL — AB (ref 700–1600)
IGM (IMMUNOGLOBULIN M), SRM: 26 mg/dL (ref 15–143)

## 2018-04-04 LAB — KAPPA/LAMBDA LIGHT CHAINS
KAPPA FREE LGHT CHN: 75.9 mg/L — AB (ref 3.3–19.4)
Kappa, lambda light chain ratio: 2.83 — ABNORMAL HIGH (ref 0.26–1.65)
LAMDA FREE LIGHT CHAINS: 26.8 mg/L — AB (ref 5.7–26.3)

## 2018-04-04 LAB — ERYTHROPOIETIN: ERYTHROPOIETIN: 14.3 m[IU]/mL (ref 2.6–18.5)

## 2018-04-05 ENCOUNTER — Ambulatory Visit: Payer: Medicare Other | Admitting: Internal Medicine

## 2018-04-05 ENCOUNTER — Encounter: Payer: Self-pay | Admitting: Internal Medicine

## 2018-04-05 VITALS — BP 124/72 | HR 62 | Ht 75.0 in | Wt 220.0 lb

## 2018-04-05 DIAGNOSIS — R001 Bradycardia, unspecified: Secondary | ICD-10-CM | POA: Diagnosis not present

## 2018-04-05 DIAGNOSIS — Z950 Presence of cardiac pacemaker: Secondary | ICD-10-CM

## 2018-04-05 DIAGNOSIS — R0602 Shortness of breath: Secondary | ICD-10-CM | POA: Diagnosis not present

## 2018-04-05 DIAGNOSIS — I10 Essential (primary) hypertension: Secondary | ICD-10-CM

## 2018-04-05 LAB — CUP PACEART INCLINIC DEVICE CHECK
Battery Remaining Longevity: 104 mo
Brady Statistic RA Percent Paced: 7.1 %
Brady Statistic RV Percent Paced: 97 %
Implantable Lead Implant Date: 20160822
Implantable Lead Implant Date: 20190118
Implantable Lead Location: 753858
Implantable Pulse Generator Implant Date: 20190118
Lead Channel Impedance Value: 1200 Ohm
Lead Channel Impedance Value: 437.5 Ohm
Lead Channel Pacing Threshold Amplitude: 1.25 V
Lead Channel Pacing Threshold Amplitude: 1.25 V
Lead Channel Pacing Threshold Amplitude: 1.5 V
Lead Channel Pacing Threshold Amplitude: 1.5 V
Lead Channel Pacing Threshold Pulse Width: 0.5 ms
Lead Channel Pacing Threshold Pulse Width: 0.5 ms
Lead Channel Sensing Intrinsic Amplitude: 2.9 mV
Lead Channel Sensing Intrinsic Amplitude: 6.4 mV
Lead Channel Setting Pacing Amplitude: 2.625
Lead Channel Setting Pacing Pulse Width: 0.5 ms
MDC IDC LEAD IMPLANT DT: 20160822
MDC IDC LEAD LOCATION: 753859
MDC IDC LEAD LOCATION: 753860
MDC IDC MSMT BATTERY VOLTAGE: 3.01 V
MDC IDC MSMT LEADCHNL LV PACING THRESHOLD PULSEWIDTH: 0.5 ms
MDC IDC MSMT LEADCHNL RA PACING THRESHOLD AMPLITUDE: 0.75 V
MDC IDC MSMT LEADCHNL RA PACING THRESHOLD AMPLITUDE: 0.75 V
MDC IDC MSMT LEADCHNL RA PACING THRESHOLD PULSEWIDTH: 0.5 ms
MDC IDC MSMT LEADCHNL RA PACING THRESHOLD PULSEWIDTH: 0.5 ms
MDC IDC MSMT LEADCHNL RV IMPEDANCE VALUE: 550 Ohm
MDC IDC MSMT LEADCHNL RV PACING THRESHOLD PULSEWIDTH: 0.5 ms
MDC IDC SESS DTM: 20190419133351
MDC IDC SET LEADCHNL RA PACING AMPLITUDE: 1.75 V
MDC IDC SET LEADCHNL RV PACING AMPLITUDE: 2.25 V
MDC IDC SET LEADCHNL RV PACING PULSEWIDTH: 0.5 ms
MDC IDC SET LEADCHNL RV SENSING SENSITIVITY: 4 mV
Pulse Gen Model: 3262
Pulse Gen Serial Number: 8983138

## 2018-04-05 NOTE — Progress Notes (Signed)
HPI [name] returns today for ongoing evaluation of his PPM after undergoing Biv upgrade just over 3 months ago. In the interim, he has done well. He is back to working regularly. He works in Biomedical scientist. No syncope.  Allergies  Allergen Reactions  . Amlodipine Swelling  . Crestor [Rosuvastatin] Other (See Comments)    Per pt unable to focus     Current Outpatient Medications  Medication Sig Dispense Refill  . atorvastatin (LIPITOR) 20 MG tablet Take 1 tablet (20 mg total) by mouth daily. 90 tablet 3  . B-D UF III MINI PEN NEEDLES 31G X 5 MM MISC USE AS DIRECTED EVERY DAY 100 each 0  . B-D UF III MINI PEN NEEDLES 31G X 5 MM MISC USE AS DIRECTED EVERY DAY 100 each 2  . carvedilol (COREG) 25 MG tablet Take 1 tablet (25 mg total) by mouth 2 (two) times daily. 180 tablet 3  . ferrous sulfate 325 (65 FE) MG tablet Take 325 mg by mouth every other day.    . Ginkgo Biloba (GINKOBA) 40 MG TABS Take 1 tablet by mouth every other day.    Marland Kitchen glipiZIDE (GLUCOTROL XL) 5 MG 24 hr tablet Take 1 tablet (5 mg total) by mouth 2 (two) times daily. 180 tablet 3  . Insulin Glargine (LANTUS SOLOSTAR) 100 UNIT/ML Solostar Pen Inject 6-10 Units into the skin daily at 10 pm. May adjust as needed 15 mL 1  . Lancets MISC Use as instructed twice a day 100 each 2  . latanoprost (XALATAN) 0.005 % ophthalmic solution Place 1 drop into both eyes at bedtime.     Marland Kitchen losartan-hydrochlorothiazide (HYZAAR) 100-25 MG tablet Take 1 tablet by mouth daily. 90 tablet 3  . metFORMIN (GLUCOPHAGE) 1000 MG tablet TAKE 1 TABLET BY MOUTH TWICE A DAY WITH MEALS 180 tablet 3  . Multiple Vitamin (MULTIVITAMIN WITH MINERALS) TABS Take 1 tablet by mouth daily.     . Omega-3 Fatty Acids (FISH OIL BURP-LESS PO) Take 1 capsule by mouth every other day.     . ONE TOUCH ULTRA TEST test strip USE AS INSTRUCTED TWICE A DAY 100 each 3   No current facility-administered medications for this visit.      Past Medical History:    Diagnosis Date  . Anemia   . Cataract   . Chronic systolic (congestive) heart failure ([place])   . Diabetes mellitus   . GERD (gastroesophageal reflux disease)   . Hyperlipidemia   . Hypertension   . MGUS (monoclonal gammopathy of unknown significance)   . Multiple myeloma ([place]) 02/2013   normal cytogenetics and FISH panel on 03/11/2013.   . Osteoarthritis   . Presence of permanent cardiac pacemaker   . Right bundle branch block   . Syncope     ROS:   All systems reviewed and negative except as noted in the HPI.   Past Surgical History:  Procedure Laterality Date  . BIV UPGRADE  01/04/2018  . BIV UPGRADE N/A 01/04/2018   Procedure: BIVP UPGRADE;  Surgeon: [name], MD;  Location: [facility];  Service: Cardiovascular;  Laterality: N/A;  . EP IMPLANTABLE DEVICE N/A 08/09/2015   Procedure: Pacemaker Implant;  Surgeon: [name], MD;  Location: [facility];  Service: Cardiovascular;  Laterality: N/A;  . EYE SURGERY       Family History  Problem Relation Age of Onset  . Hypertension Mother   . Cancer Mother   .  Stroke Mother   . Hypertension Sister   . Hypertension Brother   . Hypertension Maternal Grandmother      Social History   Socioeconomic History  . Marital status: Married    Spouse name: Not on file  . Number of children: Not on file  . Years of education: Not on file  . Highest education level: Not on file  Occupational History  . Not on file  Social Needs  . Financial resource strain: Not on file  . Food insecurity:    Worry: Not on file    Inability: Not on file  . Transportation needs:    Medical: Not on file    Non-medical: Not on file  Tobacco Use  . Smoking status: Former Smoker    Packs/day: 2.00    Years: 35.00    Pack years: 70.00    Types: Cigarettes    Start date: 03/26/1963    Last attempt to quit: 12/18/1997    Years since quitting: 20.3  . Smokeless tobacco: Never Used  . Tobacco comment: quit 15 years   Substance and Sexual Activity  . Alcohol use: No    Alcohol/week: 0.0 oz  . Drug use: No  . Sexual activity: Yes    Birth control/protection: None  Lifestyle  . Physical activity:    Days per week: Not on file    Minutes per session: Not on file  . Stress: Not on file  Relationships  . Social connections:    Talks on phone: Not on file    Gets together: Not on file    Attends religious service: Not on file    Active member of club or organization: Not on file    Attends meetings of clubs or organizations: Not on file    Relationship status: Not on file  . Intimate partner violence:    Fear of current or ex partner: Not on file    Emotionally abused: Not on file    Physically abused: Not on file    Forced sexual activity: Not on file  Other Topics Concern  . Not on file  Social History Narrative   Married   Building Maintenance     BP 124/72   Pulse 62   Ht '6\' 3"'$  (1.905 m)   Wt 220 lb (99.8 kg)   BMI 27.50 kg/m   Physical Exam:  Well appearing 72 yo man, NAD HEENT: Unremarkable Neck:  6 cm JVD, no thyromegally Lymphatics:  No adenopathy Back:  No CVA tenderness Lungs:  Clear with no wheezes HEART:  Regular rate rhythm, no murmurs, no rubs, no clicks Abd:  soft, positive bowel sounds, no organomegally, no rebound, no guarding Ext:  2 plus pulses, no edema, no cyanosis, no clubbing Skin:  No rashes no nodules Neuro:  CN II through XII intact, motor grossly intact  EKG - NSR with biv pacing  DEVICE  Normal device function.  See PaceArt for details.   Assess/Plan: 1. Chronic systolic heart failure - his symptoms are class 2. He will continue his current meds. 2. CHB - he is asymptomatic, s/p PPM insertion 3. PPM - his st. Jude DDD PM is working normally. Will follow.  4. Dyslipidemia - he has been to our lipid clinic. He will continue his atorvastatin.  Michael Wiggins.D.

## 2018-04-05 NOTE — Patient Instructions (Addendum)
Medication Instructions:  Your physician recommends that you continue on your current medications as directed. Please refer to the Current Medication list given to you today.  Labwork: None ordered.  Testing/Procedures: None ordered.  Follow-Up: Your physician wants you to follow-up in: 9 months with Dr. Lovena Le.   You will receive a reminder letter in the mail two months in advance. If you don't receive a letter, please call our office to schedule the follow-up appointment.  Remote monitoring is used to monitor your Pacemaker from home. This monitoring reduces the number of office visits required to check your device to one time per year. It allows [place] to keep an eye on the functioning of your device to ensure it is working properly. You are scheduled for a device check from home on 07/08/2018. You may send your transmission at any time that day. If you have a wireless device, the transmission will be sent automatically. After your physician reviews your transmission, you will receive a postcard with your next transmission date.  Any Other Special Instructions Will Be Listed Below (If Applicable).  If you need a refill on your cardiac medications before your next appointment, please call your pharmacy.

## 2018-04-08 ENCOUNTER — Telehealth: Payer: Self-pay | Admitting: Family Medicine

## 2018-04-08 LAB — IMMUNOFIXATION REFLEX, SERUM
IGG (IMMUNOGLOBIN G), SERUM: 1867 mg/dL — AB (ref 700–1600)
IgA: 336 mg/dL (ref 61–437)
IgM (Immunoglobulin M), Srm: 27 mg/dL (ref 15–143)

## 2018-04-08 LAB — PROTEIN ELECTROPHORESIS, SERUM, WITH REFLEX
A/G Ratio: 0.8 (ref 0.7–1.7)
ALPHA-1-GLOBULIN: 0.2 g/dL (ref 0.0–0.4)
Albumin ELP: 2.8 g/dL — ABNORMAL LOW (ref 2.9–4.4)
Alpha-2-Globulin: 0.9 g/dL (ref 0.4–1.0)
Beta Globulin: 1 g/dL (ref 0.7–1.3)
Gamma Globulin: 1.6 g/dL (ref 0.4–1.8)
Globulin, Total: 3.7 g/dL (ref 2.2–3.9)
M-Spike, %: 1.1 g/dL — ABNORMAL HIGH
SPEP Interpretation: 0
TOTAL PROTEIN ELP: 6.5 g/dL (ref 6.0–8.5)

## 2018-04-08 NOTE — Telephone Encounter (Signed)
Copied from [place] [id number]. Topic: Quick Communication - See Telephone Encounter >> Apr 08, 2018  1:51 PM Bea Graff, NT wrote: CRM for notification. See Telephone encounter for: 04/08/18. Amelia a NP with [facility] calling and states she did a Peripheral Arterial Screening on this pt today and the results were "severely impaired". Left leg read: 0.20 and right leg read: 0.22. CB#: [phone].

## 2018-04-09 ENCOUNTER — Telehealth: Payer: Self-pay | Admitting: *Deleted

## 2018-04-09 NOTE — Telephone Encounter (Signed)
-----   Message from Michael Napoleon, MD sent at 04/08/2018  4:52 PM EDT ----- Call - the myeloma numbers are creeping up.  It is now at 1.1. Before it was at 0.9.  If your diabetes is not under control, this can make the myeloma grow more quickly!!  Michael Wiggins

## 2018-04-09 NOTE — Telephone Encounter (Signed)
Call placed to patient to inform him of Dr. Antonieta Pert note below.  Patient has no questions at this time and states that he is meeting with a nurse from [facility] to control his blood sugar. Patient appreciative of call.

## 2018-04-10 ENCOUNTER — Encounter: Payer: Self-pay | Admitting: Family Medicine

## 2018-04-12 ENCOUNTER — Telehealth: Payer: Self-pay | Admitting: Emergency Medicine

## 2018-04-12 ENCOUNTER — Ambulatory Visit: Payer: Self-pay | Admitting: *Deleted

## 2018-04-12 NOTE — Telephone Encounter (Signed)
Printed and placed up front for pick up. Pt notified. -TC, CMA  Copied from [place]. Topic: General - Other >> Apr 12, 2018  2:37 PM Yvette Rack wrote: Reason for CRM: patient calling wanting a copy of his last three Ct of chest

## 2018-04-12 NOTE — Telephone Encounter (Signed)
Left message for pt to return call to the office.  

## 2018-04-12 NOTE — Telephone Encounter (Signed)
Pt has periods of watery eyes and itching in nose intermittent  spells and  episodes .Pt states he has developed environmental allergies over the years   Patient is in no  Acute  /  Severe  Distress. Pt has a  History of hypertension and diabetes and he  had  spoken to  Dr Lorelei Pont CMA recently about what he could take.  Message  routed  to Dr Lorelei Pont. Pt advised to use  Saline irrigations . Rinse  Eyes  With approved  Eye rinses . Wear mask . Keep  Windows in house closed   [place] car.  Await  Dr Edilia Bo advice. Avoid certain decongestants. And always  Consult  With your pharmacist about your symptoms  who is aware of your medications. call back or  Seek immediate attention if symptoms get worse.

## 2018-04-12 NOTE — Telephone Encounter (Signed)
Spoke with pt who would like to know which allergy medication would be safe for him to take with his medical history. He would like to make sure he does not take anything that could interfere with any of his current medical problems.   Also asked for a copy of last three CT reports. Informed pt I would print and leave up front for pick up. Pt verbalized understanding.

## 2018-04-13 NOTE — Telephone Encounter (Signed)
Called him and let him know that claritin, zyrtec or allegra would be fine

## 2018-04-24 ENCOUNTER — Other Ambulatory Visit: Payer: Self-pay | Admitting: Family Medicine

## 2018-04-26 DIAGNOSIS — R3912 Poor urinary stream: Secondary | ICD-10-CM | POA: Diagnosis not present

## 2018-04-26 DIAGNOSIS — R972 Elevated prostate specific antigen [PSA]: Secondary | ICD-10-CM | POA: Diagnosis not present

## 2018-04-26 DIAGNOSIS — N401 Enlarged prostate with lower urinary tract symptoms: Secondary | ICD-10-CM | POA: Diagnosis not present

## 2018-05-01 ENCOUNTER — Telehealth: Payer: Self-pay

## 2018-05-01 DIAGNOSIS — Q279 Congenital malformation of peripheral vascular system, unspecified: Secondary | ICD-10-CM

## 2018-05-01 NOTE — Telephone Encounter (Signed)
Please advise on referral. I do not see where patient would need referral other than urology, which has already been placed. I read a message you sent him regarding vascular screening, please advise.

## 2018-05-01 NOTE — Telephone Encounter (Signed)
Copied from [place] [id number]. Topic: Referral - Request >> May 01, 2018  4:25 PM Oliver Pila B wrote: Reason for CRM: pt called to respond to a mychart message and pt wants to go ahead w/ the referral

## 2018-05-02 NOTE — Telephone Encounter (Signed)
He had just read a message I sent about getting ABIs.  Will set this test up for him

## 2018-05-02 NOTE — Addendum Note (Signed)
Addended by: Lamar Blinks C on: 05/02/2018 05:15 PM   Modules accepted: Orders

## 2018-05-07 ENCOUNTER — Encounter (HOSPITAL_COMMUNITY): Payer: Medicare Other

## 2018-05-08 NOTE — Progress Notes (Signed)
[facility] [address] [place], [place] [address] [phone]  Date:  05/09/2018   Name:  Michael Hawkes Sr.   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 is a 72 y.o. very pleasant male patient who presents with the following:  Here today for a follow-up visit DM, HTN, hyperlipidemia, MM, CHF  Lab Results  Component Value Date   HGBA1C 7.9 (H) 03/13/2018   Cardiology visit last month: 1. Chronic systolic heart failure - his symptoms are class 2. He will continue his current meds. 2. CHB - he is asymptomatic, s/p PPM insertion 3. PPM - his st. Jude DDD PM is working normally. Will follow.  4. Dyslipidemia - he has been to our lipid clinic. He will continue his atorvastatin  He also saw his oncologist Dr. Marin Olp last month- his MM is smoldering and they are observing for now   He is here today with a cough for 2 weeks.  Was worse at first, now improving It is not really bothering him as far as his energy level is good, no fever. He feels OW well He was not sure if this is allergies or bronchitis He will bring up phlegm esp in the am.  Will cough on and off during the day   Quit smoking many years ago  He also has a concern about getting disability though the [place]. 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  He has a vague question about if I can state that this possible exposure caused his MM and ?his heart issues.  Advised that I cannot state with any certainty that this is the case, I am  sorry that I cannot be more helpful here  He does not have any sx of claudication in his legs He is getting ABIs done due to ?abnl circulation test performed at home visit  His BP will fluctuate a fair amount when he checks it at home He is on coreg, losartan/hct He sees [facility] for [facility] PSA monitoring   BP Readings from Last 3 Encounters:  05/09/18 140/88  04/05/18 124/72  04/03/18 (!) 170/98     Patient Active Problem List   Diagnosis Date Noted  . Chronic systolic (congestive) heart failure ([place]) 01/04/2018  . Pacemaker 12/08/2015  . [place]itz type 2 second degree heart block 08/08/2015  . Right bundle branch block 12/16/2014  . Syncope 12/16/2014  . Proteinuria due to type 2 diabetes mellitus ([place]) 11/12/2014  . Encounter for therapeutic drug monitoring 03/13/2013  . Multiple myeloma ([place]) 02/15/2013  . Edema 01/07/2013  . Diabetes mellitus due to underlying condition with other diabetic kidney complication ([place])   . Hyperlipidemia   . Hypertension   . MGUS (monoclonal gammopathy of unknown significance)   . GERD (gastroesophageal reflux disease)     Past Medical History:  Diagnosis Date  . Anemia   . Cataract   . Chronic systolic (congestive) heart failure ([place])   . Diabetes mellitus   . GERD (gastroesophageal reflux disease)   .  Hyperlipidemia   . Hypertension   . MGUS (monoclonal gammopathy of unknown significance)   . Multiple myeloma ([place]) 02/2013   normal cytogenetics and FISH panel on 03/11/2013.   . Osteoarthritis   . Presence of permanent cardiac pacemaker   . Right bundle branch block   . Syncope     Past Surgical History:  Procedure Laterality Date  . BIV UPGRADE  01/04/2018  . BIV UPGRADE N/A 01/04/2018   Procedure: BIVP UPGRADE;  Surgeon: [name], MD;  Location: [facility];  Service: Cardiovascular;  Laterality: N/A;  . EP IMPLANTABLE DEVICE N/A 08/09/2015   Procedure: Pacemaker Implant;  Surgeon: [name], MD;  Location: [facility];  Service: Cardiovascular;  Laterality: N/A;  . EYE SURGERY      Social History   Tobacco Use  . Smoking status: Former Smoker    Packs/day: 2.00    Years: 35.00    Pack years: 70.00    Types: Cigarettes    Start date: 03/26/1963    Last attempt to quit: 12/18/1997    Years since quitting: 20.4  . Smokeless tobacco: Never Used  . Tobacco comment: quit 15 years  Substance Use Topics  . Alcohol use: No    Alcohol/week: 0.0 oz  . Drug use: No    Family History  Problem Relation Age of Onset  . Hypertension Mother   . Cancer Mother   . Stroke Mother   . Hypertension Sister   . Hypertension Brother   . Hypertension Maternal Grandmother     Allergies  Allergen Reactions  . Amlodipine Swelling  . Crestor [Rosuvastatin] Other (See Comments)    Per pt unable to focus    Medication list has been reviewed and updated.  Current Outpatient Medications on File Prior to Visit  Medication Sig Dispense Refill  . atorvastatin (LIPITOR) 20 MG tablet Take 1 tablet (20 mg total) by mouth daily. 90 tablet 3  . B-D UF III MINI PEN NEEDLES 31G X 5 MM MISC USE AS DIRECTED EVERY DAY 100 each 0  . B-D UF III MINI PEN NEEDLES 31G X 5 MM MISC USE AS DIRECTED EVERY DAY 100 each 2  . carvedilol (COREG) 25 MG tablet Take 1 tablet (25 mg total) by mouth 2 (two) times daily. 180 tablet 3  . ferrous sulfate 325 (65 FE) MG tablet Take 325 mg by mouth every other day.    . Ginkgo Biloba (GINKOBA) 40 MG TABS Take 1 tablet by mouth every other day.    Marland Kitchen glipiZIDE (GLUCOTROL XL) 5 MG 24 hr tablet Take 1 tablet (5 mg total) by mouth 2 (two) times daily. 180 tablet 3  . Insulin Glargine (LANTUS SOLOSTAR) 100 UNIT/ML Solostar Pen Inject 6-10 Units into the skin daily at 10 pm. May adjust as needed 15 mL 1  . Lancets MISC Use as instructed twice a day 100 each 2  . latanoprost (XALATAN) 0.005 % ophthalmic solution Place 1 drop into both eyes at bedtime.     Marland Kitchen losartan-hydrochlorothiazide  (HYZAAR) 100-25 MG tablet Take 1 tablet by mouth daily. 90 tablet 3  . metFORMIN (GLUCOPHAGE) 1000 MG tablet TAKE 1 TABLET BY MOUTH TWICE A DAY WITH MEALS 180 tablet 3  . Multiple Vitamin (MULTIVITAMIN WITH MINERALS) TABS Take 1 tablet by mouth daily.     . Omega-3 Fatty Acids (FISH OIL BURP-LESS PO) Take 1 capsule by mouth every other day.     . ONE TOUCH  ULTRA TEST test strip USE AS INSTRUCTED TWICE A DAY 100 each 3   No current facility-administered medications on file prior to visit.     Review of Systems:  As per HPI- otherwise negative.   Physical Examination: Vitals:   05/09/18 0936  BP: 140/88  Pulse: 93  Resp: 16  Temp: 97.6 F (36.4 C)  SpO2: 96%   Vitals:   05/09/18 0936  Weight: 220 lb 6.4 oz (100 kg)  Height: '6\' 3"'$  (1.905 m)   Body mass index is 27.55 kg/m. Ideal Body Weight: Weight in (lb) to have BMI = 25: 199.6  GEN: WDWN, NAD, Non-toxic, A & O x 3, tall build, looks well  HEENT: Atraumatic, Normocephalic. Neck supple. No masses, No LAD.  Bilateral TM wnl, oropharynx normal.  PEERL,EOMI.   Ears and Nose: No external deformity. CV: RRR, No M/G/R. No JVD. No thrill. No extra heart sounds. PULM: CTA B, no wheezes, crackles, rhonchi. No retractions. No resp. distress. No accessory muscle use.  EXTR: No c/c/e NEURO Normal gait.  PSYCH: Normally interactive. Conversant. Not depressed or anxious appearing.  Calm demeanor.    Assessment and Plan: Peripheral vascular anomaly  Uncontrolled type 2 diabetes mellitus without complication, with long-term current use of insulin (HCC)  Essential hypertension  Cough  BP is under ok control He is going to have ABIS done soon per vascular He will see me in 3 months for an A1c Cough- likely viral or allergic.  He will use some zyrtec that he has at home and will let me know if not resolved in 1-2 weeks- Sooner if worse.    Signed Lamar Blinks, MD

## 2018-05-09 ENCOUNTER — Ambulatory Visit (INDEPENDENT_AMBULATORY_CARE_PROVIDER_SITE_OTHER): Payer: Medicare Other | Admitting: Family Medicine

## 2018-05-09 ENCOUNTER — Encounter: Payer: Self-pay | Admitting: Family Medicine

## 2018-05-09 VITALS — BP 140/88 | HR 93 | Temp 97.6°F | Resp 16 | Ht 75.0 in | Wt 220.4 lb

## 2018-05-09 DIAGNOSIS — Z794 Long term (current) use of insulin: Secondary | ICD-10-CM

## 2018-05-09 DIAGNOSIS — IMO0001 Reserved for inherently not codable concepts without codable children: Secondary | ICD-10-CM

## 2018-05-09 DIAGNOSIS — R059 Cough, unspecified: Secondary | ICD-10-CM

## 2018-05-09 DIAGNOSIS — Q279 Congenital malformation of peripheral vascular system, unspecified: Secondary | ICD-10-CM | POA: Diagnosis not present

## 2018-05-09 DIAGNOSIS — I10 Essential (primary) hypertension: Secondary | ICD-10-CM

## 2018-05-09 DIAGNOSIS — E1165 Type 2 diabetes mellitus with hyperglycemia: Secondary | ICD-10-CM | POA: Diagnosis not present

## 2018-05-09 DIAGNOSIS — R05 Cough: Secondary | ICD-10-CM | POA: Diagnosis not present

## 2018-05-09 NOTE — Patient Instructions (Addendum)
Please see me in about 3 months I will watch for your circulation test results Please let me know if your cough is still active in a week or two and we can do an antibiotic Perhaps try claritin or similar for a couple of months for allergies

## 2018-05-15 ENCOUNTER — Ambulatory Visit (HOSPITAL_COMMUNITY)
Admission: RE | Admit: 2018-05-15 | Discharge: 2018-05-15 | Disposition: A | Discharge status: Home / Self Care | Payer: Medicare Other | Source: Ambulatory Visit | Attending: Family Medicine | Admitting: Family Medicine

## 2018-05-15 DIAGNOSIS — Q279 Congenital malformation of peripheral vascular system, unspecified: Secondary | ICD-10-CM | POA: Diagnosis not present

## 2018-05-25 ENCOUNTER — Encounter: Payer: Self-pay | Admitting: Family Medicine

## 2018-05-29 DIAGNOSIS — T1512XA Foreign body in conjunctival sac, left eye, initial encounter: Secondary | ICD-10-CM | POA: Diagnosis not present

## 2018-06-08 ENCOUNTER — Other Ambulatory Visit: Payer: Self-pay | Admitting: Family Medicine

## 2018-06-19 DIAGNOSIS — H40013 Open angle with borderline findings, low risk, bilateral: Secondary | ICD-10-CM | POA: Diagnosis not present

## 2018-06-24 ENCOUNTER — Other Ambulatory Visit: Payer: Self-pay | Admitting: Family Medicine

## 2018-07-05 ENCOUNTER — Other Ambulatory Visit: Payer: Self-pay | Admitting: Family Medicine

## 2018-07-07 ENCOUNTER — Other Ambulatory Visit: Payer: Self-pay | Admitting: Family Medicine

## 2018-07-08 ENCOUNTER — Ambulatory Visit (INDEPENDENT_AMBULATORY_CARE_PROVIDER_SITE_OTHER): Payer: Medicare Other | Admitting: *Deleted

## 2018-07-08 DIAGNOSIS — I441 Atrioventricular block, second degree: Secondary | ICD-10-CM

## 2018-07-08 NOTE — Progress Notes (Signed)
Remote pacemaker transmission.   

## 2018-07-09 ENCOUNTER — Encounter: Payer: Self-pay | Admitting: Cardiology

## 2018-07-10 ENCOUNTER — Ambulatory Visit (INDEPENDENT_AMBULATORY_CARE_PROVIDER_SITE_OTHER): Payer: Medicare Other | Admitting: Physician Assistant

## 2018-07-10 ENCOUNTER — Encounter: Payer: Self-pay | Admitting: Physician Assistant

## 2018-07-10 ENCOUNTER — Other Ambulatory Visit: Payer: Self-pay

## 2018-07-10 ENCOUNTER — Ambulatory Visit: Payer: Self-pay | Admitting: *Deleted

## 2018-07-10 VITALS — BP 144/87 | HR 75 | Temp 98.0°F | Resp 20 | Ht 73.7 in | Wt 214.2 lb

## 2018-07-10 DIAGNOSIS — J0100 Acute maxillary sinusitis, unspecified: Secondary | ICD-10-CM

## 2018-07-10 DIAGNOSIS — I10 Essential (primary) hypertension: Secondary | ICD-10-CM

## 2018-07-10 MED ORDER — AMOXICILLIN-POT CLAVULANATE ER 1000-62.5 MG PO TB12: 2.0000 | ORAL_TABLET | Freq: Two times a day (BID) | ORAL | 0 refills | Status: AC

## 2018-07-10 NOTE — Patient Instructions (Addendum)
Please take antibiotic with food or milk. Please seek care at PCP's office if symptoms worsen or fail to improve with this treatment. Thank you for letting me participate in your health and well being.  Sinusitis, Adult Sinusitis is soreness and inflammation of your sinuses. Sinuses are hollow spaces in the bones around your face. They are located:  Around your eyes.  In the middle of your forehead.  Behind your nose.  In your cheekbones.  Your sinuses and nasal passages are lined with a stringy fluid (mucus). Mucus normally drains out of your sinuses. When your nasal tissues get inflamed or swollen, the mucus can get trapped or blocked so air cannot flow through your sinuses. This lets bacteria, viruses, and funguses grow, and that leads to infection. Follow these instructions at home: Medicines  Take, use, or apply over-the-counter and prescription medicines only as told by your doctor. These may include nasal sprays.  If you were prescribed an antibiotic medicine, take it as told by your doctor. Do not stop taking the antibiotic even if you start to feel better. Hydrate and Humidify  Drink enough water to keep your pee (urine) clear or pale yellow.  Use a cool mist humidifier to keep the humidity level in your home above 50%.  Breathe in steam for 10-15 minutes, 3-4 times a day or as told by your doctor. You can do this in the bathroom while a hot shower is running.  Try not to spend time in cool or dry air. Rest  Rest as much as possible.  Sleep with your head raised (elevated).  Make sure to get enough sleep each night. General instructions  Put a warm, moist washcloth on your face 3-4 times a day or as told by your doctor. This will help with discomfort.  Wash your hands often with soap and water. If there is no soap and water, use hand sanitizer.  Do not smoke. Avoid being around people who are smoking (secondhand smoke).  Keep all follow-up visits as told by your  doctor. This is important. Contact a doctor if:  You have a fever.  Your symptoms get worse.  Your symptoms do not get better within 10 days. Get help right away if:  You have a very bad headache.  You cannot stop throwing up (vomiting).  You have pain or swelling around your face or eyes.  You have trouble seeing.  You feel confused.  Your neck is stiff.  You have trouble breathing. This information is not intended to replace advice given to you by your health care provider. Make sure you discuss any questions you have with your health care provider. Document Released: 05/22/2008 Document Revised: 07/30/2016 Document Reviewed: 09/29/2015 Elsevier Interactive Patient Education  2018 [employer].    IF you received an x-ray today, you will receive an invoice from [facility]. Please contact [facility] at [phone] with questions or concerns regarding your invoice.   IF you received labwork today, you will receive an invoice from [facility]. Please contact [facility] at [phone] with questions or concerns regarding your invoice.   Our billing staff will not be able to assist you with questions regarding bills from these companies.  You will be contacted with the lab results as soon as they are available. The fastest way to get your results is to activate your My Chart account. Instructions are located on the last page of this paperwork. If you have not heard from [place] regarding the results in 2 weeks,  please contact this office.

## 2018-07-10 NOTE — Telephone Encounter (Signed)
Patient is having sinus pain- Amoxicillin for 1 week- patient reports his pain is back. He is going out of town and his dentist is on vacation as well. Acute appointment made per patient request.  Reason for Disposition . [1] Taking antibiotic > 7 days AND [2] nasal discharge not improved  Answer Assessment - Initial Assessment Questions 1. ANTIBIOTIC: "What antibiotic are you receiving?" "How many times per day?"     Patient has finished antibiotics given by dentist- his sinus opain is coming back 2. ONSET: "When was the antibiotic started?"     Over 1 week ago 3. PAIN: "How bad is the sinus pain?"   (Scale 1-10; mild, moderate or severe)   - MILD (1-3): doesn't interfere with normal activities    - MODERATE (4-7): interferes with normal activities (e.g., work or school) or awakens from sleep   - SEVERE (8-10): excruciating pain and patient unable to do any normal activities        mild 4. FEVER: "Do you have a fever?" If so, ask: "What is it, how was it measured, and when did it start?"      No fever 5. SYMPTOMS: "Are there any other symptoms you're concerned about?" If so, ask: "When did it start?"     Sinus pain has returned and patient is going out of town 6. PREGNANCY: "Is there any chance you are pregnant?" "When was your last menstrual period?"     n/a  Protocols used: SINUS INFECTION ON ANTIBIOTIC FOLLOW-UP CALL-A-AH

## 2018-07-10 NOTE — Progress Notes (Signed)
MRN: [id number] DOB: 06/17/1946  Subjective:   Michael Kasparek Sr. is a 72 y.o. male presenting for chief complaint of Facial Pain (X 1 day- pt states sinus pain) .  Had left upper teeth pain x 3 weeks ago and went to the dentist. They did a thorough check and said it was not a tooth issue but a instead a sinus infection. Given 7 day course of amoxicillin. Sx resolved. But then on last day on antibiotic started feeling the pain again. Has associated nasal drainage. Sinus/teeth pressure is mostly left sided.  Denies fever, ear pain, sore throat, wheezing, shortness of breath, chest pain, myalgia and cough, nausea, vomiting, abdominal pain and diarrhea. Denies tingling, numbness, and visual disturbance.  Has not had sick contact with anyone.  No history of seasonal allergies, no history of asthma. Denies smoking. Denies any other aggravating or relieving factors, no other questions or concerns.  Michael Wiggins has a current medication list which includes the following prescription(s): atorvastatin, b-d uf iii mini pen needles, b-d uf iii mini pen needles, carvedilol, ferrous sulfate, ginkgo biloba, glipizide, insulin glargine, lancets, latanoprost, losartan-hydrochlorothiazide, metformin, multivitamin with minerals, omega-3 fatty acids, and one touch ultra test. Also is allergic to amlodipine and crestor [rosuvastatin].  Michael Wiggins  has a past medical history of Anemia, Cataract, Chronic systolic (congestive) heart failure ([place]), Diabetes mellitus, GERD (gastroesophageal reflux disease), Hyperlipidemia, Hypertension, MGUS (monoclonal gammopathy of unknown significance), Multiple myeloma ([place]) (02/2013), Osteoarthritis, Presence of permanent cardiac pacemaker, Right bundle branch block, and Syncope. Also  has a past surgical history that includes Eye surgery; Cardiac catheterization (N/A, 08/09/2015); BIV UPGRADE (01/04/2018); and BIV UPGRADE (N/A, 01/04/2018).   Objective:   Vitals: BP (!) 144/87 (BP Location: Right  Arm, Patient Position: Sitting, Cuff Size: Normal)   Pulse 75   Temp 98 F (36.7 C) (Oral)   Resp 20   Ht 6' 1.7" (1.872 m)   Wt 214 lb 3.2 oz (97.2 kg)   SpO2 97%   BMI 27.73 kg/m   Physical Exam  Constitutional: He is oriented to person, place, and time. He appears well-developed and well-nourished. No distress.  HENT:  Head: Normocephalic and atraumatic.  Right Ear: Tympanic membrane, external ear and ear canal normal.  Left Ear: Tympanic membrane, external ear and ear canal normal.  Nose: Mucosal edema present. Right sinus exhibits no maxillary sinus tenderness and no frontal sinus tenderness. Left sinus exhibits maxillary sinus tenderness (with palpation and when he leans head foward) and frontal sinus tenderness.  Mouth/Throat: Uvula is midline and mucous membranes are normal. No dental abscesses. No posterior oropharyngeal edema, posterior oropharyngeal erythema or tonsillar abscesses. No tonsillar exudate.  No pain with palpation of b/l temporal regions.   Eyes: Pupils are equal, round, and reactive to light. Conjunctivae and EOM are normal.  Neck: Normal range of motion.  Cardiovascular: Normal rate, regular rhythm, normal heart sounds and intact distal pulses.  Pulmonary/Chest: Effort normal and breath sounds normal. He has no decreased breath sounds. He has no wheezes. He has no rhonchi. He has no rales.  Lymphadenopathy:       Head (right side): No submental, no submandibular, no tonsillar, no preauricular, no posterior auricular and no occipital adenopathy present.       Head (left side): No submental, no submandibular, no tonsillar, no preauricular, no posterior auricular and no occipital adenopathy present.    He has no cervical adenopathy.       Right: No supraclavicular adenopathy present.  Left: No supraclavicular adenopathy present.  Neurological: He is alert and oriented to person, place, and time. No sensory deficit.  Skin: Skin is warm and dry.  Psychiatric:  He has a normal mood and affect.  Vitals reviewed.   No results found for this or any previous visit (from the past 24 hour(s)).  Assessment and Plan :  1. Acute maxillary sinusitis, recurrence not specified Hx and PE suspicious for sinusitis. Had round of amoxicillin, but rec augmentin due to comorbidities and age. No dental abscess palpated. Pt reassured. Encouraged to return to clinic if symptoms worsen, do not improve, or as needed.  If no improvement, consider sinus imaging.  - amoxicillin-clavulanate (AUGMENTIN XR) 1000-62.5 MG 12 hr tablet; Take 2 tablets by mouth 2 (two) times daily for 7 days.  Dispense: 28 tablet; Refill: 0  2. Essential hypertension Slightly elevated in office today. Instructed to check bp outside of office over the next couple of weeks. Contact PCP  if consistently >140/90. Given strict ED precautions.     Tenna Delaine, PA-C  [facility] 07/10/2018 4:47 PM

## 2018-07-30 DIAGNOSIS — R972 Elevated prostate specific antigen [PSA]: Secondary | ICD-10-CM | POA: Diagnosis not present

## 2018-08-04 LAB — CUP PACEART REMOTE DEVICE CHECK
Battery Remaining Percentage: 95.5 %
Battery Voltage: 3.01 V
Brady Statistic AP VP Percent: 9.3 %
Brady Statistic AS VP Percent: 89 %
Brady Statistic RA Percent Paced: 8.3 %
Implantable Lead Implant Date: 20160822
Implantable Lead Implant Date: 20160822
Implantable Lead Location: 753858
Lead Channel Impedance Value: 560 Ohm
Lead Channel Pacing Threshold Amplitude: 0.625 V
Lead Channel Pacing Threshold Pulse Width: 0.5 ms
Lead Channel Pacing Threshold Pulse Width: 0.5 ms
Lead Channel Sensing Intrinsic Amplitude: 2.4 mV
Lead Channel Setting Pacing Amplitude: 2.125
Lead Channel Setting Pacing Amplitude: 2.875
Lead Channel Setting Pacing Pulse Width: 0.5 ms
MDC IDC LEAD IMPLANT DT: 20190118
MDC IDC LEAD LOCATION: 753859
MDC IDC LEAD LOCATION: 753860
MDC IDC MSMT BATTERY REMAINING LONGEVITY: 91 mo
MDC IDC MSMT LEADCHNL LV IMPEDANCE VALUE: 1300 Ohm
MDC IDC MSMT LEADCHNL LV PACING THRESHOLD AMPLITUDE: 1.875 V
MDC IDC MSMT LEADCHNL RA IMPEDANCE VALUE: 450 Ohm
MDC IDC MSMT LEADCHNL RV PACING THRESHOLD AMPLITUDE: 1.125 V
MDC IDC MSMT LEADCHNL RV PACING THRESHOLD PULSEWIDTH: 0.5 ms
MDC IDC MSMT LEADCHNL RV SENSING INTR AMPL: 12 mV
MDC IDC PG IMPLANT DT: 20190118
MDC IDC PG SERIAL: 8983138
MDC IDC SESS DTM: 20190722060012
MDC IDC SET LEADCHNL RA PACING AMPLITUDE: 1.625
MDC IDC SET LEADCHNL RV PACING PULSEWIDTH: 0.5 ms
MDC IDC SET LEADCHNL RV SENSING SENSITIVITY: 4 mV
MDC IDC STAT BRADY AP VS PERCENT: 1 %
MDC IDC STAT BRADY AS VS PERCENT: 1.2 %
Pulse Gen Model: 3262

## 2018-08-07 NOTE — Progress Notes (Addendum)
[facility] [address] [place], [place] [address] [phone]  Date:  08/12/2018   Name:  Michael Teall Sr.   DOB:  03-09-1946   MRN:  [id number]  PCP:  Darreld Mclean, MD    Chief Complaint: Diabetes (3 month follow up, no conerns)   History of Present Illness:  Michael Backlund Sr. is a 72 y.o. 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  He did have a skin outbreak on his face yesterday- it seemed to be "oozing clear liquid" He used a zyrtec but it made him feel drosy However his skin is now looking much better  Former smoker- he had chest CT in March of this year that looked ok Due for flu shot and A1c - he would like to wait on flu, we don't have the high dose in yet I last saw him in May He had mildly abnl ABIs done in response to an abnormal "circulation test" done by home nurse.  I had meant for him to see one of the vascular cardiologists but I don't think this was ever arranged.  Can refer to vascular surgery if he would like  Lab Results  Component Value Date   HGBA1C 7.9 (H) 03/13/2018   He sees heme/onc every 6 months and is stable with his IgG kappa smoldering myeloma.  His most recent PSA had gone up and I referred him to see urology- however I do not see a note from them as of yet Pt reports he did see urology- they have not done a bx, they are watching this for now  He is seeing Eda Keys  Requested records today  Most recent nephrology note from April of this year - they are monitoring his proteinuria and also help to watch his BP  Also per most recnet cardiology note from April: Assess/Plan: 1. Chronic systolic heart failure - his symptoms are class 2. He will continue his current meds. 2. CHB - he is  asymptomatic, s/p PPM insertion 3. PPM - his [facility] DDD PM is working normally. Will follow.  4. Dyslipidemia - he has been to our lipid clinic. He will continue his atorvastatin.  Lab Results  Component Value Date   PSA 5.31 (H) 03/13/2018   PSA 2.87 06/07/2015   PSA 2.88 01/20/2014   He notices that his BP will vary- sometimes by lunchtime after he has been working, his BP may be down to SBP in the 120s or even lower. Although his BP was high today he is thus hesitant to go up on his meds. Repeat BP however looked good   He notes that he has not been as compliant with his diet recently and is concerned about his A1c He is eating a bit more fruit Glipizide 5 BID Metformin 1000 BID Losartan/hctz 100/25 lipitor Coreg 25 BID lantus- 6- 10 units depending  He is fasting today   Patient Active Problem List   Diagnosis Date Noted  . Chronic systolic (congestive) heart failure ([place]) 01/04/2018  . Pacemaker 12/08/2015  . [place]itz type 2 second degree heart block 08/08/2015  . Right bundle branch block 12/16/2014  . Syncope 12/16/2014  . Proteinuria due to type 2 diabetes mellitus ([place]) 11/12/2014  . Encounter for therapeutic drug monitoring  03/13/2013  . Multiple myeloma ([place]) 02/15/2013  . Edema 01/07/2013  . Diabetes mellitus due to underlying condition with other diabetic kidney complication ([place])   . Hyperlipidemia   . Hypertension   . MGUS (monoclonal gammopathy of unknown significance)   . GERD (gastroesophageal reflux disease)     Past Medical History:  Diagnosis Date  . Anemia   . Cataract   . Chronic systolic (congestive) heart failure ([place])   . Diabetes mellitus   . GERD (gastroesophageal reflux disease)   . Hyperlipidemia   . Hypertension   . MGUS (monoclonal gammopathy of unknown significance)   . Multiple myeloma ([place]) 02/2013   normal cytogenetics and FISH panel on 03/11/2013.   . Osteoarthritis   . Presence of permanent cardiac pacemaker   . Right bundle  branch block   . Syncope     Past Surgical History:  Procedure Laterality Date  . BIV UPGRADE  01/04/2018  . BIV UPGRADE N/A 01/04/2018   Procedure: BIVP UPGRADE;  Surgeon: [name], MD;  Location: [facility];  Service: Cardiovascular;  Laterality: N/A;  . EP IMPLANTABLE DEVICE N/A 08/09/2015   Procedure: Pacemaker Implant;  Surgeon: [name], MD;  Location: [facility];  Service: Cardiovascular;  Laterality: N/A;  . EYE SURGERY      Social History   Tobacco Use  . Smoking status: Former Smoker    Packs/day: 2.00    Years: 35.00    Pack years: 70.00    Types: Cigarettes    Start date: 03/26/1963    Last attempt to quit: 12/18/1997    Years since quitting: 20.6  . Smokeless tobacco: Never Used  . Tobacco comment: quit 15 years  Substance Use Topics  . Alcohol use: No    Alcohol/week: 0.0 standard drinks  . Drug use: No    Family History  Problem Relation Age of Onset  . Hypertension Mother   . Cancer Mother   . Stroke Mother   . Hypertension Sister   . Hypertension Brother   . Hypertension Maternal Grandmother     Allergies  Allergen Reactions  . Amlodipine Swelling  . Crestor [Rosuvastatin] Other (See Comments)    Per pt unable to focus    Medication list has been reviewed and updated.  Current Outpatient Medications on File Prior to Visit  Medication Sig Dispense Refill  . atorvastatin (LIPITOR) 20 MG tablet Take 1 tablet (20 mg total) by mouth daily. 90 tablet 3  . B-D UF III MINI PEN NEEDLES 31G X 5 MM MISC USE AS DIRECTED EVERY DAY 100 each 0  . B-D UF III MINI PEN NEEDLES 31G X 5 MM MISC USE AS DIRECTED EVERY DAY 100 each 2  . carvedilol (COREG) 25 MG tablet Take 1 tablet (25 mg total) by mouth 2 (two) times daily. 180 tablet 3  . ferrous sulfate 325 (65 FE) MG tablet Take 325 mg by mouth every other day.    . Ginkgo Biloba (GINKOBA) 40 MG TABS Take 1 tablet by mouth every other day.    Marland Kitchen glipiZIDE (GLUCOTROL XL) 5 MG 24 hr tablet  TAKE 1 TABLET BY MOUTH TWICE A DAY 180 tablet 1  . Insulin Glargine (LANTUS SOLOSTAR) 100 UNIT/ML Solostar Pen Inject 6-10 Units into the skin daily at 10 pm. May adjust as needed 15 mL 1  . Lancets MISC Use as instructed twice a day 100 each 2  . latanoprost (XALATAN) 0.005 % ophthalmic solution Place  1 drop into both eyes at bedtime.     Marland Kitchen losartan-hydrochlorothiazide (HYZAAR) 100-25 MG tablet Take 1 tablet by mouth daily. 90 tablet 3  . metFORMIN (GLUCOPHAGE) 1000 MG tablet TAKE 1 TABLET BY MOUTH TWICE A DAY WITH MEALS 180 tablet 1  . Multiple Vitamin (MULTIVITAMIN WITH MINERALS) TABS Take 1 tablet by mouth daily.     . Omega-3 Fatty Acids (FISH OIL BURP-LESS PO) Take 1 capsule by mouth every other day.     . ONE TOUCH ULTRA TEST test strip USE AS INSTRUCTED TWICE A DAY 100 each 3   No current facility-administered medications on file prior to visit.     Review of Systems:  As per HPI- otherwise negative. No Cp or SOB His left chest pacemaker can be a little sore if he hits it on something, otherwise it is not bothersome at all to him    Physical Examination: Vitals:   08/12/18 0835  BP: (!) 172/92  Pulse: 61  Resp: 16  Temp: 97.8 F (36.6 C)  SpO2: 98%   Vitals:   08/12/18 0835  Weight: 221 lb (100.2 kg)  Height: 6' 1.5" (1.867 m)   Body mass index is 28.76 kg/m. Ideal Body Weight: Weight in (lb) to have BMI = 25: 191.7  GEN: WDWN, NAD, Non-toxic, A & O x 3, looks well, tall build  HEENT: Atraumatic, Normocephalic. Neck supple. No masses, No LAD.  Bilateral TM wnl, oropharynx normal.  PEERL,EOMI.   Ears and Nose: No external deformity. CV: RRR, No M/G/R. No JVD. No thrill. No extra heart sounds. PULM: CTA B, no wheezes, crackles, rhonchi. No retractions. No resp. distress. No accessory muscle use. ABD: S, NT, ND EXTR: No c/c/e NEURO Normal gait.  PSYCH: Normally interactive. Conversant. Not depressed or anxious appearing.  Calm demeanor.    Assessment and  Plan: Uncontrolled type 2 diabetes mellitus without complication, with long-term current use of insulin ([place]) - Plan: Hemoglobin A1c  Mixed hyperlipidemia - Plan: Lipid panel  Essential hypertension - Plan: Basic metabolic panel  Cardiac pacemaker in situ  Peripheral vascular anomaly  Multiple myeloma, remission status unspecified ([place])  Await labs as above Repeat BP looks fine, continue current meds He is seeing heme/onc soon  He is seeing cardiology soon as well- will ask Dr. Lovena Le about his ABIs, if this is not in  Cardiology purview I will refer to vascular for him  Will plan further follow- up pending labs.   Signed Lamar Blinks, MD  Received his labs, message to pt- Metabolic profile looks fine Cholesterol is under good control Your a1c is still a bit high but continues to improve.  Please watch your diet and let's repeat in 3- 4 months Take care Michael Wiggins  Results for orders placed or performed in visit on 16/07/37  Basic metabolic panel  Result Value Ref Range   Sodium 134 (L) 135 - 145 mEq/L   Potassium 4.6 3.5 - 5.1 mEq/L   Chloride 100 96 - 112 mEq/L   CO2 29 19 - 32 mEq/L   Glucose, Bld 140 (H) 70 - 99 mg/dL   BUN 24 (H) 6 - 23 mg/dL   Creatinine, Ser 1.13 0.40 - 1.50 mg/dL   Calcium 9.3 8.4 - 10.5 mg/dL   GFR 81.85 >60.00 mL/min  Hemoglobin A1c  Result Value Ref Range   Hgb A1c MFr Bld 7.8 (H) 4.6 - 6.5 %  Lipid panel  Result Value Ref Range   Cholesterol 144 0 - 200  mg/dL   Triglycerides 86.0 0.0 - 149.0 mg/dL   HDL 32.90 (L) >39.00 mg/dL   VLDL 17.2 0.0 - 40.0 mg/dL   LDL Cholesterol 94 0 - 99 mg/dL   Total CHOL/HDL Ratio 4    NonHDL 110.86

## 2018-08-12 ENCOUNTER — Encounter: Payer: Self-pay | Admitting: Family Medicine

## 2018-08-12 ENCOUNTER — Ambulatory Visit (INDEPENDENT_AMBULATORY_CARE_PROVIDER_SITE_OTHER): Payer: Medicare Other | Admitting: Family Medicine

## 2018-08-12 VITALS — BP 128/85 | HR 61 | Temp 97.8°F | Resp 16 | Ht 73.5 in | Wt 221.0 lb

## 2018-08-12 DIAGNOSIS — E1165 Type 2 diabetes mellitus with hyperglycemia: Secondary | ICD-10-CM

## 2018-08-12 DIAGNOSIS — Z794 Long term (current) use of insulin: Secondary | ICD-10-CM | POA: Diagnosis not present

## 2018-08-12 DIAGNOSIS — E782 Mixed hyperlipidemia: Secondary | ICD-10-CM | POA: Diagnosis not present

## 2018-08-12 DIAGNOSIS — Z950 Presence of cardiac pacemaker: Secondary | ICD-10-CM | POA: Diagnosis not present

## 2018-08-12 DIAGNOSIS — Q279 Congenital malformation of peripheral vascular system, unspecified: Secondary | ICD-10-CM

## 2018-08-12 DIAGNOSIS — IMO0001 Reserved for inherently not codable concepts without codable children: Secondary | ICD-10-CM

## 2018-08-12 DIAGNOSIS — I10 Essential (primary) hypertension: Secondary | ICD-10-CM | POA: Diagnosis not present

## 2018-08-12 DIAGNOSIS — C9000 Multiple myeloma not having achieved remission: Secondary | ICD-10-CM

## 2018-08-12 LAB — BASIC METABOLIC PANEL
BUN: 24 mg/dL — ABNORMAL HIGH (ref 6–23)
CALCIUM: 9.3 mg/dL (ref 8.4–10.5)
CO2: 29 meq/L (ref 19–32)
Chloride: 100 mEq/L (ref 96–112)
Creatinine, Ser: 1.13 mg/dL (ref 0.40–1.50)
GFR: 81.85 mL/min (ref >60.00)
GLUCOSE: 140 mg/dL — AB (ref 70–99)
POTASSIUM: 4.6 meq/L (ref 3.5–5.1)
SODIUM: 134 meq/L — AB (ref 135–145)

## 2018-08-12 LAB — LIPID PANEL
CHOL/HDL RATIO: 4
CHOLESTEROL: 144 mg/dL (ref 0–200)
HDL: 32.9 mg/dL — AB (ref >39.00)
LDL Cholesterol: 94 mg/dL (ref 0–99)
NonHDL: 110.86
TRIGLYCERIDES: 86 mg/dL (ref 0.0–149.0)
VLDL: 17.2 mg/dL (ref 0.0–40.0)

## 2018-08-12 LAB — HEMOGLOBIN A1C: Hgb A1c MFr Bld: 7.8 % — ABNORMAL HIGH (ref 4.6–6.5)

## 2018-08-12 NOTE — Patient Instructions (Signed)
Very good to see you today as always!  Take care and I will be in touch with your labs Remember your flu shot this fall Your BP looked good on recheck today Please ask Dr. Lovena Le about your toe circulation at your next visit- if this is not something cardiology wishes to look at I will have you see vascular surgery  Let's plan to visit in 6 months and take care!

## 2018-09-19 ENCOUNTER — Telehealth: Payer: Self-pay | Admitting: Internal Medicine

## 2018-09-19 NOTE — Telephone Encounter (Signed)
  Error, no note needed

## 2018-10-01 ENCOUNTER — Other Ambulatory Visit: Payer: Self-pay | Admitting: Internal Medicine

## 2018-10-02 ENCOUNTER — Encounter: Payer: Self-pay | Admitting: Hematology

## 2018-10-02 ENCOUNTER — Inpatient Hospital Stay: Payer: Medicare Other

## 2018-10-02 ENCOUNTER — Inpatient Hospital Stay: Payer: Medicare Other | Attending: Hematology & Oncology | Admitting: Hematology

## 2018-10-02 DIAGNOSIS — D509 Iron deficiency anemia, unspecified: Secondary | ICD-10-CM | POA: Diagnosis not present

## 2018-10-02 DIAGNOSIS — C9000 Multiple myeloma not having achieved remission: Secondary | ICD-10-CM | POA: Diagnosis not present

## 2018-10-02 DIAGNOSIS — Z79899 Other long term (current) drug therapy: Secondary | ICD-10-CM | POA: Insufficient documentation

## 2018-10-02 DIAGNOSIS — Z794 Long term (current) use of insulin: Secondary | ICD-10-CM | POA: Insufficient documentation

## 2018-10-02 DIAGNOSIS — D472 Monoclonal gammopathy: Secondary | ICD-10-CM

## 2018-10-02 DIAGNOSIS — Z23 Encounter for immunization: Secondary | ICD-10-CM

## 2018-10-02 DIAGNOSIS — K219 Gastro-esophageal reflux disease without esophagitis: Secondary | ICD-10-CM

## 2018-10-02 LAB — CMP (CANCER CENTER ONLY)
ALBUMIN: 3.3 g/dL — AB (ref 3.5–5.0)
ALT: 21 U/L (ref 0–44)
ANION GAP: 8 (ref 5–15)
AST: 21 U/L (ref 15–41)
Alkaline Phosphatase: 51 U/L (ref 38–126)
BUN: 21 mg/dL (ref 8–23)
CO2: 27 mmol/L (ref 22–32)
CREATININE: 1.28 mg/dL — AB (ref 0.61–1.24)
Calcium: 9.5 mg/dL (ref 8.9–10.3)
Chloride: 103 mmol/L (ref 98–111)
GFR, Estimated: 54 mL/min — ABNORMAL LOW (ref >60)
Glucose, Bld: 137 mg/dL — ABNORMAL HIGH (ref 70–99)
Potassium: 4.3 mmol/L (ref 3.5–5.1)
SODIUM: 138 mmol/L (ref 135–145)
TOTAL PROTEIN: 7.9 g/dL (ref 6.5–8.1)
Total Bilirubin: 0.3 mg/dL (ref 0.3–1.2)

## 2018-10-02 LAB — CBC WITH DIFFERENTIAL (CANCER CENTER ONLY)
ABS IMMATURE GRANULOCYTES: 0.05 10*3/uL (ref 0.00–0.07)
BASOS ABS: 0.1 10*3/uL (ref 0.0–0.1)
BASOS PCT: 1 %
EOS PCT: 6 %
Eosinophils Absolute: 0.4 10*3/uL (ref 0.0–0.5)
HCT: 36.1 % — ABNORMAL LOW (ref 39.0–52.0)
HEMOGLOBIN: 11.2 g/dL — AB (ref 13.0–17.0)
Immature Granulocytes: 1 %
LYMPHS PCT: 29 %
Lymphs Abs: 2.1 10*3/uL (ref 0.7–4.0)
MCH: 27.7 pg (ref 26.0–34.0)
MCHC: 31 g/dL (ref 30.0–36.0)
MCV: 89.1 fL (ref 80.0–100.0)
MONO ABS: 0.7 10*3/uL (ref 0.1–1.0)
Monocytes Relative: 9 %
NEUTROS ABS: 3.9 10*3/uL (ref 1.7–7.7)
NRBC: 0 % (ref 0.0–0.2)
Neutrophils Relative %: 54 %
PLATELETS: 229 10*3/uL (ref 150–400)
RBC: 4.05 MIL/uL — AB (ref 4.22–5.81)
RDW: 14.2 % (ref 11.5–15.5)
WBC: 7.2 10*3/uL (ref 4.0–10.5)

## 2018-10-02 LAB — IRON AND TIBC
Iron: 70 ug/dL (ref 42–163)
SATURATION RATIOS: 21 % — AB (ref 42–163)
TIBC: 328 ug/dL (ref 202–409)
UIBC: 258 ug/dL

## 2018-10-02 LAB — FERRITIN: Ferritin: 80 ng/mL (ref 24–336)

## 2018-10-02 MED ORDER — INFLUENZA VAC SPLIT QUAD 0.5 ML IM SUSY
PREFILLED_SYRINGE | INTRAMUSCULAR | Status: AC
  Filled 2018-10-02: qty 0.5

## 2018-10-02 MED ORDER — INFLUENZA VAC SPLIT QUAD 0.5 ML IM SUSY
0.5000 mL | PREFILLED_SYRINGE | Freq: Once | INTRAMUSCULAR | Status: AC
  Administered 2018-10-02: 0.5 mL via INTRAMUSCULAR

## 2018-10-02 NOTE — Progress Notes (Addendum)
[facility] OFFICE PROGRESS NOTE  Patient Care Team: [name], [name], MD as PCP - General (Family Medicine) Edrick Oh, MD (Nephrology) Macarthur Critchley, [place] as Consulting Physician (Optometry) Marin Olp, Rudell Cobb, MD as Consulting Physician (Oncology)  Principle Diagnosis:   IgG kappa smoldering myeloma  Iron deficiency anemia  Current Therapy:   Observation  ASSESSMENT & PLAN:  IgG kappa smoldering myeloma -Asymptomatic from myeloma standpoint; no constitutional symptoms -Myeloma labs pending today; anemia appears to be stable -At this time, I do not see any evidence of disease progression; we will continue every 43-monthfollow-up with multiple myeloma labs  Normocytic anemia -Overall stable today; no symptoms of bleeding, such as hematuria, hematochezia or melena -History of iron deficiency requiring IV iron transfusion, last in April 2015 -Most recent colonoscopy in 2016 showed benign polyps but otherwise no evidence of malignancy; next scheduled in 2021  Health maintenance -We discussed the importance of preventive care and reviewed the vaccination programs. -He does not have any prior allergic reactions to influenza vaccination. -He agrees to proceed with influenza vaccination today and we will administer it today at the clinic.  Orders Placed This Encounter  Procedures  . CBC with Differential (Cancer Center Only)    Standing Status:   Future    Standing Expiration Date:   11/06/2019  . [place]    Standing Status:   Future    Standing Expiration Date:   11/06/2019  . Ferritin    Standing Status:   Future    Standing Expiration Date:   11/06/2019  . Iron and TIBC    Standing Status:   Future    Standing Expiration Date:   11/06/2019  . Protein electrophoresis, serum    Standing Status:   Future    Standing Expiration Date:   11/06/2019  . Immunofixation electrophoresis    Standing Status:   Future    Standing Expiration Date:    11/06/2019  . Kappa/lambda light chains    Standing Status:   Future    Standing Expiration Date:   11/06/2019  . IgG, IgA, IgM    Standing Status:   Future    Standing Expiration Date:   11/06/2019    All questions were answered. The patient knows to call the clinic with any problems, questions or concerns. No barriers to learning was detected.  Return to clinic in 6 months with Dr. EMarin Olpfor myeloma labs and clinic follow-up.  YTish Men MD 10/02/2018 10:40 AM  CHIEF COMPLAINT: "I am doing okay"  INTERVAL HISTORY: Mr. GRosanna Randyreturns to clinic for follow-up of smoldering myeloma today.  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    He denies any fever, chill, weight loss, night sweats, adenopathy, chest pain or dyspnea.  He is followed by his cardiologist for his pacemaker and denies any problems with that.  He denies any symptoms of bleeding, such as hematuria, hematochezia, or melena.  His myeloma studies are pending today. Back in April 2019, his M spike was 1.1 g/dL.  His IgG level was 1867 mg/dL.  His Kappa light chain was 75.9 mg/dL, all of which stable comparing to October 2018.  SUMMARY OF ONCOLOGIC HISTORY:  No history exists.    REVIEW OF SYSTEMS:   Constitutional: ( - ) fevers, ( - )  chills , ( - ) night sweats Eyes: ( - ) blurriness of vision, ( - ) double  vision, ( - ) watery eyes Ears, nose, mouth, throat, and face: ( - ) mucositis, ( - ) sore throat Respiratory: ( - ) cough, ( - ) dyspnea, ( - ) wheezes Cardiovascular: ( - ) palpitation, ( - ) chest discomfort, ( - ) lower extremity swelling Gastrointestinal:  ( - ) nausea, ( - ) heartburn, ( - ) change in bowel habits Skin: ( - ) abnormal skin rashes Lymphatics: ( - ) new lymphadenopathy, ( - ) easy bruising Neurological: ( - ) numbness, ( - ) tingling, ( - ) new weaknesses Behavioral/Psych: ( - ) mood change, ( - ) new changes   All other systems were reviewed with the patient and are negative.  I have reviewed the past medical history, past surgical history, social history and family history with the patient and they are unchanged from previous note.  ALLERGIES:  is allergic to amlodipine and crestor [rosuvastatin].  MEDICATIONS:  Current Outpatient Medications  Medication Sig Dispense Refill  . atorvastatin (LIPITOR) 20 MG tablet Take 1 tablet (20 mg total) by mouth daily. 90 tablet 3  . atorvastatin (LIPITOR) 40 MG tablet Take 40 mg by mouth daily.  11  . B-D UF III MINI PEN NEEDLES 31G X 5 MM MISC USE AS DIRECTED EVERY DAY 100 each 0  . B-D UF III MINI PEN NEEDLES 31G X 5 MM MISC USE AS DIRECTED EVERY DAY 100 each 2  . carvedilol (COREG) 25 MG tablet Take 1 tablet (25 mg total) by mouth 2 (two) times daily. 180 tablet 3  . ferrous sulfate 325 (65 FE) MG tablet Take 325 mg by mouth every other day.    . Ginkgo Biloba (GINKOBA) 40 MG TABS Take 1 tablet by mouth every other day.    Marland Kitchen glipiZIDE (GLUCOTROL XL) 5 MG 24 hr tablet TAKE 1 TABLET BY MOUTH TWICE A DAY 180 tablet 1  . Insulin Glargine (LANTUS SOLOSTAR) 100 UNIT/ML Solostar Pen Inject 6-10 Units into the skin daily at 10 pm. May adjust as needed 15 mL 1  . Lancets MISC Use as instructed twice a day 100 each 2  . latanoprost (XALATAN) 0.005 % ophthalmic solution Place 1 drop into both eyes at bedtime.     Marland Kitchen losartan-hydrochlorothiazide (HYZAAR) 100-25 MG tablet TAKE 1 TABLET BY MOUTH EVERY DAY 90 tablet 1  . metFORMIN (GLUCOPHAGE) 1000 MG tablet TAKE 1 TABLET BY MOUTH TWICE A DAY WITH MEALS 180 tablet 1  . Multiple Vitamin (MULTIVITAMIN WITH MINERALS) TABS Take 1 tablet by mouth daily.     . Omega-3 Fatty Acids (FISH OIL BURP-LESS PO) Take 1 capsule by mouth every other day.     . ONE TOUCH ULTRA TEST test strip USE AS INSTRUCTED TWICE A DAY 100 each 3   No current facility-administered medications for this visit.     PHYSICAL EXAMINATION: ECOG  PERFORMANCE STATUS: 0 - Asymptomatic  Wt Readings from Last 3 Encounters:  10/02/18 220 lb 0.1 oz (99.8 kg)  08/12/18 221 lb (100.2 kg)  07/10/18 214 lb 3.2 oz (97.2 kg)   Temp Readings from Last 3 Encounters:  10/02/18 97.8 F (36.6 C) (Oral)  08/12/18 97.8 F (36.6 C) (Oral)  07/10/18 98 F (36.7 C) (Oral)   BP Readings from Last 3 Encounters:  10/02/18 (!) 178/95  08/12/18 128/85  07/10/18 (!) 144/87   Pulse Readings from Last 3 Encounters:  10/02/18 61  08/12/18 61  07/10/18 75    GENERAL: alert, no distress and  comfortable SKIN: skin color, texture, turgor are normal, no rashes or significant lesions EYES: conjunctiva are pink and non-injected, sclera clear OROPHARYNX: no exudate, no erythema; lips, buccal mucosa, and tongue normal  NECK: supple, non-tender LYMPH:  no palpable lymphadenopathy in the cervical or axillary  LUNGS: clear to auscultation and percussion with normal breathing effort HEART: regular rate & rhythm and no murmurs and no lower extremity edema ABDOMEN: soft, non-tender, non-distended, normal bowel sounds Musculoskeletal: no cyanosis of digits and no clubbing  PSYCH: alert & oriented x 3, fluent speech NEURO: no focal motor/sensory deficits  LABORATORY DATA:  I have reviewed the data as listed    Component Value Date/Time   NA 134 (L) 08/12/2018 0901   NA 135 12/26/2017 0847   NA 142 10/03/2017 0910   NA 137 04/03/2017 0852   K 4.6 08/12/2018 0901   K 3.9 10/03/2017 0910   K 4.2 04/03/2017 0852   CL 100 08/12/2018 0901   CL 103 10/03/2017 0910   CL 108 (H) 05/15/2013 0912   CO2 29 08/12/2018 0901   CO2 28 10/03/2017 0910   CO2 26 04/03/2017 0852   GLUCOSE 140 (H) 08/12/2018 0901   GLUCOSE 190 (H) 10/03/2017 0910   GLUCOSE 150 (H) 05/15/2013 0912   BUN 24 (H) 08/12/2018 0901   BUN 21 12/26/2017 0847   BUN 13 10/03/2017 0910   BUN 16.3 04/03/2017 0852   CREATININE 1.13 08/12/2018 0901   CREATININE 1.20 04/03/2018 0852    CREATININE 1.07 11/16/2017 1437   CREATININE 1.0 04/03/2017 0852   CALCIUM 9.3 08/12/2018 0901   CALCIUM 9.3 10/03/2017 0910   CALCIUM 9.0 04/03/2017 0852   PROT 7.3 04/03/2018 0852   PROT 7.0 10/03/2017 0910   PROT 6.4 10/03/2017 0910   PROT 7.0 04/03/2017 0852   ALBUMIN 3.0 (L) 04/03/2018 0852   ALBUMIN 2.7 (L) 10/03/2017 0910   ALBUMIN 3.1 (L) 04/03/2017 0852   AST 27 04/03/2018 0852   AST 18 04/03/2017 0852   ALT 31 04/03/2018 0852   ALT 34 10/03/2017 0910   ALT 19 04/03/2017 0852   ALKPHOS 51 04/03/2018 0852   ALKPHOS 51 10/03/2017 0910   ALKPHOS 46 04/03/2017 0852   BILITOT 0.5 04/03/2018 0852   BILITOT 0.38 04/03/2017 0852   GFRNONAA 70 12/26/2017 0847   GFRAA 81 12/26/2017 0847    Lab Results  Component Value Date   SPEP . 04/03/2018    Lab Results  Component Value Date   WBC 7.2 10/02/2018   NEUTROABS 3.9 10/02/2018   HGB 11.2 (L) 10/02/2018   HCT 36.1 (L) 10/02/2018   MCV 89.1 10/02/2018   PLT 229 10/02/2018      Chemistry      Component Value Date/Time   NA 134 (L) 08/12/2018 0901   NA 135 12/26/2017 0847   NA 142 10/03/2017 0910   NA 137 04/03/2017 0852   K 4.6 08/12/2018 0901   K 3.9 10/03/2017 0910   K 4.2 04/03/2017 0852   CL 100 08/12/2018 0901   CL 103 10/03/2017 0910   CL 108 (H) 05/15/2013 0912   CO2 29 08/12/2018 0901   CO2 28 10/03/2017 0910   CO2 26 04/03/2017 0852   BUN 24 (H) 08/12/2018 0901   BUN 21 12/26/2017 0847   BUN 13 10/03/2017 0910   BUN 16.3 04/03/2017 0852   CREATININE 1.13 08/12/2018 0901   CREATININE 1.20 04/03/2018 0852   CREATININE 1.07 11/16/2017 1437   CREATININE 1.0 04/03/2017 5329  Component Value Date/Time   CALCIUM 9.3 08/12/2018 0901   CALCIUM 9.3 10/03/2017 0910   CALCIUM 9.0 04/03/2017 0852   ALKPHOS 51 04/03/2018 0852   ALKPHOS 51 10/03/2017 0910   ALKPHOS 46 04/03/2017 0852   AST 27 04/03/2018 0852   AST 18 04/03/2017 0852   ALT 31 04/03/2018 0852   ALT 34 10/03/2017 0910   ALT 19  04/03/2017 0852   BILITOT 0.5 04/03/2018 0852   BILITOT 0.38 04/03/2017 6237

## 2018-10-03 LAB — KAPPA/LAMBDA LIGHT CHAINS
KAPPA, LAMDA LIGHT CHAIN RATIO: 3.64 — AB (ref 0.26–1.65)
Kappa free light chain: 75.3 mg/L — ABNORMAL HIGH (ref 3.3–19.4)
LAMDA FREE LIGHT CHAINS: 20.7 mg/L (ref 5.7–26.3)

## 2018-10-03 LAB — IGG, IGA, IGM
IGM (IMMUNOGLOBULIN M), SRM: 33 mg/dL (ref 15–143)
IgA: 314 mg/dL (ref 61–437)
IgG (Immunoglobin G), Serum: 1912 mg/dL — ABNORMAL HIGH (ref 700–1600)

## 2018-10-04 LAB — IMMUNOFIXATION REFLEX, SERUM
IGA: 331 mg/dL (ref 61–437)
IgG (Immunoglobin G), Serum: 2092 mg/dL — ABNORMAL HIGH (ref 700–1600)
IgM (Immunoglobulin M), Srm: 28 mg/dL (ref 15–143)

## 2018-10-04 LAB — PROTEIN ELECTROPHORESIS, SERUM, WITH REFLEX
A/G Ratio: 0.8 (ref 0.7–1.7)
ALPHA-1-GLOBULIN: 0.2 g/dL (ref 0.0–0.4)
ALPHA-2-GLOBULIN: 0.9 g/dL (ref 0.4–1.0)
Albumin ELP: 3.1 g/dL (ref 2.9–4.4)
BETA GLOBULIN: 1 g/dL (ref 0.7–1.3)
GAMMA GLOBULIN: 1.7 g/dL (ref 0.4–1.8)
Globulin, Total: 3.9 g/dL (ref 2.2–3.9)
M-SPIKE, %: 1.3 g/dL — AB
SPEP Interpretation: 0
Total Protein ELP: 7 g/dL (ref 6.0–8.5)

## 2018-10-07 ENCOUNTER — Ambulatory Visit (INDEPENDENT_AMBULATORY_CARE_PROVIDER_SITE_OTHER): Payer: Medicare Other | Admitting: *Deleted

## 2018-10-07 DIAGNOSIS — I441 Atrioventricular block, second degree: Secondary | ICD-10-CM | POA: Diagnosis not present

## 2018-10-07 NOTE — Progress Notes (Signed)
Remote pacemaker transmission.   

## 2018-10-09 ENCOUNTER — Telehealth: Payer: Self-pay | Admitting: *Deleted

## 2018-10-09 NOTE — Telephone Encounter (Addendum)
Patient is aware of results  ----- Message from Volanda Napoleon, MD sent at 10/09/2018 10:06 AM EDT ----- Call - the myeloma is still pretty stable!!  pete

## 2018-10-30 NOTE — Progress Notes (Addendum)
Subjective:   Michael Endicott Sr. is a 72 y.o. male who presents for an Initial Medicare Annual Wellness Visit.  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  Review of Systems No ROS.  Medicare Wellness Visit. Additional risk factors are reflected in the social history. Cardiac Risk Factors include: advanced age (>55mn, >>47women);diabetes mellitus;dyslipidemia;hypertension;male gender Sleep patterns:  Sleeps well. Wakes twice to urinate. Home Safety/Smoke Alarms: Feels safe in home. Smoke alarms in place. Lives with wife who also still works. 2 story home.   Eye- yearly with Dr. JLuretha Ruedper pt.  Male:   CCS-last 05/03/15. 5 yr recall per pt PSA-  Lab Results  Component Value Date   PSA 5.31 (H) 03/13/2018   PSA 2.87 06/07/2015   PSA 2.88 01/20/2014      Objective:    Today's Vitals   10/31/18 1033  BP: (!) 168/88  Pulse: 60  SpO2: 96%  Weight: 220 lb 12.8 oz (100.2 kg)  Height: 6' 2" (1.88 m)   Body mass index is 28.35 kg/m.  Advanced Directives 10/31/2018 04/03/2018 03/01/2018 01/04/2018 04/03/2017 10/03/2016 03/31/2016  Does Patient Have a Medical Advance Directive? _0  No No  Does patient want to make changes to medical advance directive? Yes (MAU/Ambulatory/Procedural Areas - Information given) - - - - - -  Would patient like information on creating a medical advance directive? - No - Patient declined No - Patient declined No - Patient declined - No - patient declined information -    Current Medications (verified) Outpatient Encounter Medications as of 10/31/2018  Medication Sig  . atorvastatin (LIPITOR) 20 MG tablet Take 1 tablet (20 mg total) by mouth daily.  . B-D UF III MINI PEN NEEDLES 31G X 5 MM MISC USE AS DIRECTED EVERY DAY  . B-D UF III MINI PEN NEEDLES 31G X 5 MM MISC USE AS DIRECTED EVERY DAY  . carvedilol (COREG) 25 MG tablet Take 1 tablet (25 mg total) by mouth 2 (two) times daily.  . ferrous sulfate 325 (65 FE) MG tablet Take  325 mg by mouth every other day.  . Ginkgo Biloba (GINKOBA) 40 MG TABS Take 1 tablet by mouth every other day.  .Marland KitchenglipiZIDE (GLUCOTROL XL) 5 MG 24 hr tablet TAKE 1 TABLET BY MOUTH TWICE A DAY  . Insulin Glargine (LANTUS SOLOSTAR) 100 UNIT/ML Solostar Pen Inject 6-10 Units into the skin daily at 10 pm. May adjust as needed  . Lancets MISC Use as instructed twice a day  . latanoprost (XALATAN) 0.005 % ophthalmic solution Place 1 drop into both eyes at bedtime.   .Marland Kitchenlosartan-hydrochlorothiazide (HYZAAR) 100-25 MG tablet TAKE 1 TABLET BY MOUTH EVERY DAY  . metFORMIN (GLUCOPHAGE) 1000 MG tablet TAKE 1 TABLET BY MOUTH TWICE A DAY WITH MEALS  . Multiple Vitamin (MULTIVITAMIN WITH MINERALS) TABS Take 1 tablet by mouth daily.   . Omega-3 Fatty Acids (FISH OIL BURP-LESS PO) Take 1 capsule by mouth every other day.   . ONE TOUCH ULTRA TEST test strip USE AS INSTRUCTED TWICE A DAY  . [DISCONTINUED] atorvastatin (LIPITOR) 40 MG tablet Take 40 mg by mouth daily.   No facility-administered encounter medications on file as of 10/31/2018.     Allergies (verified) Amlodipine and Crestor [rosuvastatin]   History: Past Medical History:  Diagnosis Date  . Anemia   . Cataract   . Chronic systolic [place]congestive) heart failure ([place]   . Diabetes mellitus   . GERD (gastroesophageal  reflux disease)   . Hyperlipidemia   . Hypertension   . MGUS (monoclonal gammopathy of unknown significance)   . Multiple myeloma ([place]) 02/2013   normal cytogenetics and FISH panel on 03/11/2013.   . Osteoarthritis   . Presence of permanent cardiac pacemaker   . Right bundle branch block   . Syncope    Past Surgical History:  Procedure Laterality Date  . BIV UPGRADE  01/04/2018  . BIV UPGRADE N/A 01/04/2018   Procedure: BIVP UPGRADE;  Surgeon: [name], MD;  Location: [facility];  Service: Cardiovascular;  Laterality: N/A;  . EP IMPLANTABLE DEVICE N/A 08/09/2015   Procedure: Pacemaker Implant;  Surgeon: [name], MD;  Location: [facility];  Service: Cardiovascular;  Laterality: N/A;  . EYE SURGERY     Family History  Problem Relation Age of Onset  . Hypertension Mother   . Cancer Mother   . Stroke Mother   . Hypertension Sister   . Hypertension Brother   . Hypertension Maternal Grandmother    Social History   Socioeconomic History  . Marital status: Married    Spouse name: Not on file  . Number of children: 3  . Years of education: Not on file  . Highest education level: Not on file  Occupational History  . Not on file  Social Needs  . Financial resource strain: Not on file  . Food insecurity:    Worry: Not on file    Inability: Not on file  . Transportation needs:    Medical: Not on file    Non-medical: Not on file  Tobacco Use  . Smoking status: Former Smoker    Packs/day: 2.00    Years: 35.00    Pack years: 70.00    Types: Cigarettes    Start date: 03/26/1963    Last attempt to quit: 12/18/1997    Years since quitting: 20.8  . Smokeless tobacco: Never Used  . Tobacco comment: quit 15 years  Substance and Sexual Activity  . Alcohol use: No    Alcohol/week: 0.0 standard drinks  . Drug use: No  . Sexual activity: Yes    Birth control/protection: None  Lifestyle  . Physical activity:    Days per week: Not on file    Minutes per session: Not on file  . Stress: Not on file  Relationships  . Social connections:    Talks on phone: Not on file    Gets together: Not on file    Attends religious service: Not on file    Active member of club or organization: Not on file    Attends meetings of clubs or organizations: Not on file    Relationship status: Not on file  Other Topics Concern  . Not on file  Social History Narrative   Married   Building Maintenance   Tobacco Counseling Counseling given: Not Answered Comment: quit 15 years   Clinical Intake: Pain : No/denies pain   Activities of Daily Living In your present state of health, do you have any  difficulty performing the following activities: 10/31/2018 07/10/2018  Hearing? N N  Vision? N N  Difficulty concentrating or making decisions? N Y  Comment - some times  Walking or climbing stairs? N N  Dressing or bathing? N N  Doing errands, shopping? N N  Preparing Food and eating ? N -  Using the Toilet? N -  In the past six months, have you accidently leaked urine? N -  Comment follows with urology -  Do you have problems with loss of bowel control? N -  Managing your Medications? N -  Managing your Finances? N -  Housekeeping or managing your Housekeeping? N -  Some recent data might be hidden     Immunizations and Health Maintenance Immunization History  Administered Date(s) Administered  . Influenza Split 11/04/2012  . Influenza, High Dose Seasonal PF 08/24/2016, 12/13/2017  . Influenza,inj,Quad PF,6+ Mos 09/21/2014, 09/16/2015, 10/02/2018  . Pneumococcal Conjugate-13 06/01/2014  . Pneumococcal Polysaccharide-23 05/06/2012  . Td 10/17/2004  . Tdap 11/21/2014   There are no preventive care reminders to display for this patient.  Patient Care Team: [name], [name], MD as PCP - General (Family Medicine) Edrick Oh, MD (Nephrology) Macarthur Critchley, [place] as Consulting Physician (Optometry) Marin Olp, Rudell Cobb, MD as Consulting Physician (Oncology)  Indicate any recent Medical Services you may have received from other than [facility] providers in the past year (date may be approximate).    Assessment:   This is a routine wellness examination for [employer]. Physical assessment deferred to PCP.  Hearing/Vision screen  Visual Acuity Screening   Right eye Left eye Both eyes  Without correction:     With correction: 20/20 20/20 20/20  Hearing Screening Comments: Able to hear conversational tones w/o difficulty. No issues reported.    Dietary issues and exercise activities discussed: Current Exercise Habits: The patient does not participate in regular exercise at present(still working  physically.), Exercise limited by: None identified Diet (meal preparation, eat out, water intake, caffeinated beverages, dairy products, fruits and vegetables):  Breakfast: oatmeal or veggies omelet  Lunch: sandwich Dinner: 2 pc fried chicken, green beans, rice roll Reports drinking plenty of water.  Goals    . Patient Stated     Maintain current health      Depression Screen PHQ 2/9 Scores 10/31/2018 07/10/2018 12/13/2017 11/23/2016  PHQ - 2 Score 0 0 0 0  Exception Documentation - - - -    Fall Risk Fall Risk  10/31/2018 07/10/2018 12/13/2017 11/23/2016 10/03/2016  Falls in the past year? 0 No No No No  Comment - - - - -  Number falls in past yr: - - - - -  Injury with Fall? - - - - -  Risk for fall due to : - - - - -   Cognitive Function: Ad8 score reviewed for issues:  Issues making decisions:no  Less interest in hobbies / activities:no  Repeats questions, stories (family complaining):no  Trouble using ordinary gadgets (microwave, computer, phone):no  Forgets the month or year: no  Mismanaging finances: no  Remembering appts:no  Daily problems with thinking and/or memory:no Ad8 score is=0        Screening Tests Health Maintenance  Topic Date Due  . FOOT EXAM  12/13/2018  . OPHTHALMOLOGY EXAM  01/18/2019  . HEMOGLOBIN A1C  02/12/2019  . TETANUS/TDAP  11/21/2024  . COLONOSCOPY  05/04/2025  . INFLUENZA VACCINE  Completed  . Hepatitis C Screening  Completed  . PNA vac Low Risk Adult  Completed      Plan:    Please schedule your next medicare wellness visit with me in 1 yr.  Your blood pressure was elevated today. Dr.[name] would like for you to take your medication as prescribed, make appointment to see her in 2 weeks, check your blood pressure at home and write it down and bring to your next appointment.  Continue to eat heart healthy diet (full of fruits, vegetables, whole  grains, lean protein, water--limit salt, fat, and sugar intake) and increase  physical activity as tolerated.    I have personally reviewed and noted the following in the patient's chart:   . Medical and social history . Use of alcohol, tobacco or illicit drugs  . Current medications and supplements . Functional ability and status . Nutritional status . Physical activity . Advanced directives . List of other physicians . Hospitalizations, surgeries, and ER visits in previous 12 months . Vitals . Screenings to include cognitive, depression, and falls . Referrals and appointments  In addition, I have reviewed and discussed with patient certain preventive protocols, quality metrics, and best practice recommendations. A written personalized care plan for preventive services as well as general preventive health recommendations were provided to patient.     Shela Nevin, RN   10/31/2018      I have reviewed the above MWE note by Ms. Vevelyn Royals and agree with her documentation Denny Peon MD

## 2018-10-31 ENCOUNTER — Ambulatory Visit (INDEPENDENT_AMBULATORY_CARE_PROVIDER_SITE_OTHER): Payer: Medicare Other | Admitting: *Deleted

## 2018-10-31 ENCOUNTER — Encounter: Payer: Self-pay | Admitting: *Deleted

## 2018-10-31 VITALS — BP 168/84 | HR 60 | Ht 74.0 in | Wt 220.8 lb

## 2018-10-31 DIAGNOSIS — Z0000 Encounter for general adult medical examination without abnormal findings: Secondary | ICD-10-CM | POA: Diagnosis not present

## 2018-10-31 NOTE — Patient Instructions (Signed)
Please schedule your next medicare wellness visit with me in 1 yr.  Your blood pressure was elevated today. Dr.[name] would like for you to take your medication as prescribed, make appointment to see her in 2 weeks, check your blood pressure at home and write it down and bring to your next appointment.  Continue to eat heart healthy diet (full of fruits, vegetables, whole grains, lean protein, water--limit salt, fat, and sugar intake) and increase physical activity as tolerated.   [name] , Thank you for taking time to come for your Medicare Wellness Visit. I appreciate your ongoing commitment to your health goals. Please review the following plan we discussed and let me know if I can assist you in the future.   These are the goals we discussed: Goals    . Patient Stated     Maintain current health       This is a list of the screening recommended for you and due dates:  Health Maintenance  Topic Date Due  . Complete foot exam   12/13/2018  . Eye exam for diabetics  01/18/2019  . Hemoglobin A1C  02/12/2019  . Tetanus Vaccine  11/21/2024  . Colon Cancer Screening  05/04/2025  . Flu Shot  Completed  .  Hepatitis C: One time screening is recommended by Center for Disease Control  (CDC) for  adults born from [birth year] through 1966.   Completed  . Pneumonia vaccines  Completed    Hypertension Hypertension is another name for high blood pressure. High blood pressure forces your heart to work harder to pump blood. This can cause problems over time. There are two numbers in a blood pressure reading. There is a top number (systolic) over a bottom number (diastolic). It is best to have a blood pressure below 120/80. Healthy choices can help lower your blood pressure. You may need medicine to help lower your blood pressure if:  Your blood pressure cannot be lowered with healthy choices.  Your blood pressure is higher than 130/80.  Follow these instructions at home: Eating and  drinking  If directed, follow the DASH eating plan. This diet includes: ? Filling half of your plate at each meal with fruits and vegetables. ? Filling one quarter of your plate at each meal with whole grains. Whole grains include whole wheat pasta, brown rice, and whole grain bread. ? Eating or drinking low-fat dairy products, such as skim milk or low-fat yogurt. ? Filling one quarter of your plate at each meal with low-fat (lean) proteins. Low-fat proteins include fish, skinless chicken, eggs, beans, and tofu. ? Avoiding fatty meat, cured and processed meat, or chicken with skin. ? Avoiding premade or processed food.  Eat less than 1,500 mg of salt (sodium) a day.  Limit alcohol use to no more than 1 drink a day for nonpregnant women and 2 drinks a day for men. One drink equals 12 oz of beer, 5 oz of wine, or 1 oz of hard liquor. Lifestyle  Work with your doctor to stay at a healthy weight or to lose weight. Ask your doctor what the best weight is for you.  Get at least 30 minutes of exercise that causes your heart to beat faster (aerobic exercise) most days of the week. This may include walking, swimming, or biking.  Get at least 30 minutes of exercise that strengthens your muscles (resistance exercise) at least 3 days a week. This may include lifting weights or pilates.  Do not use any products that  contain nicotine or tobacco. This includes cigarettes and e-cigarettes. If you need help quitting, ask your doctor.  Check your blood pressure at home as told by your doctor.  Keep all follow-up visits as told by your doctor. This is important. Medicines  Take over-the-counter and prescription medicines only as told by your doctor. Follow directions carefully.  Do not skip doses of blood pressure medicine. The medicine does not work as well if you skip doses. Skipping doses also puts you at risk for problems.  Ask your doctor about side effects or reactions to medicines that you  should watch for. Contact a doctor if:  You think you are having a reaction to the medicine you are taking.  You have headaches that keep coming back (recurring).  You feel dizzy.  You have swelling in your ankles.  You have trouble with your vision. Get help right away if:  You get a very bad headache.  You start to feel confused.  You feel weak or numb.  You feel faint.  You get very bad pain in your: ? Chest. ? Belly (abdomen).  You throw up (vomit) more than once.  You have trouble breathing. Summary  Hypertension is another name for high blood pressure.  Making healthy choices can help lower blood pressure. If your blood pressure cannot be controlled with healthy choices, you may need to take medicine. This information is not intended to replace advice given to you by your health care provider. Make sure you discuss any questions you have with your health care provider. Document Released: 05/22/2008 Document Revised: 11/01/2016 Document Reviewed: 11/01/2016 Elsevier Interactive Patient Education  [employer].

## 2018-11-03 ENCOUNTER — Other Ambulatory Visit: Payer: Self-pay | Admitting: Family Medicine

## 2018-11-14 NOTE — Progress Notes (Addendum)
[place] [facility] [address], [place], [place] [address] [phone]  Date:  11/18/2018   Name:  Michael Teeple Sr.   DOB:  01-12-1946   MRN:  [id number]  PCP:  Darreld Mclean, MD    Chief Complaint: Hypertension (bp follow up)   History of Present Illness:  Michael Sheerin Sr. is a 72 y.o. very pleasant male patient who presents with the following:  Following up on BP today History of heart block with pacer, CHF, DM, MM monitored by Dr. Salem Senate, hyperlipidemia,HTN  Lab Results  Component Value Date   HGBA1C 7.8 (H) 08/12/2018    Per most recent oncology note: ASSESSMENT & PLAN:  IgG kappa smoldering myeloma -Asymptomatic from myeloma standpoint; no constitutional symptoms -Myeloma labs pending today; anemia appears to be stable -At this time, I do not see any evidence of disease progression; we will continue every 37-monthfollow-up with multiple myeloma labs Normocytic anemia -Overall stable today; no symptoms of bleeding, such as hematuria, hematochezia or melena -History of iron deficiency requiring IV iron transfusion, last in April 2015 -Most recent colonoscopy in 2016 showed benign polyps but otherwise no evidence of malignancy; next scheduled in 2021  Per most recent cardiology note, Dr. TLovena Le Assess/Plan: 1. Chronic systolic heart failure - his symptoms are class 2. He will continue his current meds. 2. CHB - he is asymptomatic, s/p PPM insertion 3. PPM - his st. Jude DDD PM is working normally. Will follow.  4. Dyslipidemia - he has been to our lipid clinic. He will continue his atorvastatin.  At recent MWE his BP was noted to be elevated so he has come in to check on this today He brings in some BP readings for me His AM readings are high, but BP tends to go down to normal as the day goes on. He would like to take his hyzaar at night- however, I am concerned about him being up with urinary frequency if we  do this.  Decided to split into 2 components and have him take hcyz in the am and losartan in the pm   Needs an A1c check today   lipitor Coreg 25 BID Iron Glipizide lantus- he is taking 7 units most days, sometimes 6 if he is running lower  hyzaar- takes in the am  Metformin 1000 BID Omega 3 otc  PSA high in March-  Referred him to urology and he has seen Dr. EJunious Silk They did not do a bx  Reviewed his most recent note- they are monitoring him for now   His brother is about 269years older but he was recently dx with prostate cancer. He is doing ok   Pneumonia, tetanus UTD Suggest shingrix to him  He will notice left sided sciatica type pain in the afternoone/ evening that gets better over night  It is not bothersome enough for him to want to do anything about this at this time  BP Readings from Last 3 Encounters:  11/18/18 (!) 160/80  10/31/18 (!) 168/84  10/02/18 (!) 178/95     Pulse Readings from Last 3 Encounters:  11/18/18 86  10/31/18 60  10/02/18 61    Patient Active Problem List   Diagnosis Date Noted  . Chronic systolic (congestive) heart failure ([place] 01/04/2018  . Pacemaker 12/08/2015  . Mobitz type 2 second degree heart block 08/08/2015  . Right bundle branch block 12/16/2014  . Syncope 12/16/2014  . Proteinuria  due to type 2 diabetes mellitus ([place]) 11/12/2014  . Encounter for therapeutic drug monitoring 03/13/2013  . Multiple myeloma ([place]) 02/15/2013  . Edema 01/07/2013  . Diabetes mellitus due to underlying condition with other diabetic kidney complication ([place])   . Hyperlipidemia   . Hypertension   . MGUS (monoclonal gammopathy of unknown significance)   . GERD (gastroesophageal reflux disease)     Past Medical History:  Diagnosis Date  . Anemia   . Cataract   . Chronic systolic (congestive) heart failure ([place])   . Diabetes mellitus   . GERD (gastroesophageal reflux disease)   . Hyperlipidemia   . Hypertension   . MGUS (monoclonal  gammopathy of unknown significance)   . Multiple myeloma ([place]) 02/2013   normal cytogenetics and FISH panel on 03/11/2013.   . Osteoarthritis   . Presence of permanent cardiac pacemaker   . Right bundle branch block   . Syncope     Past Surgical History:  Procedure Laterality Date  . BIV UPGRADE  01/04/2018  . BIV UPGRADE N/A 01/04/2018   Procedure: BIVP UPGRADE;  Surgeon: [name], MD;  Location: [place] [facility];  Service: Cardiovascular;  Laterality: N/A;  . EP IMPLANTABLE DEVICE N/A 08/09/2015   Procedure: Pacemaker Implant;  Surgeon: [name], MD;  Location: [place] [facility];  Service: Cardiovascular;  Laterality: N/A;  . EYE SURGERY      Social History   Tobacco Use  . Smoking status: Former Smoker    Packs/day: 2.00    Years: 35.00    Pack years: 70.00    Types: Cigarettes    Start date: 03/26/1963    Last attempt to quit: 12/18/1997    Years since quitting: 20.9  . Smokeless tobacco: Never Used  . Tobacco comment: quit 15 years  Substance Use Topics  . Alcohol use: No    Alcohol/week: 0.0 standard drinks  . Drug use: No    Family History  Problem Relation Age of Onset  . Hypertension Mother   . Cancer Mother   . Stroke Mother   . Hypertension Sister   . Hypertension Brother   . Hypertension Maternal Grandmother     Allergies  Allergen Reactions  . Amlodipine Swelling  . Crestor [Rosuvastatin] Other (See Comments)    Per pt unable to focus    Medication list has been reviewed and updated.  Current Outpatient Medications on File Prior to Visit  Medication Sig Dispense Refill  . atorvastatin (LIPITOR) 20 MG tablet Take 1 tablet (20 mg total) by mouth daily. 90 tablet 3  . B-D UF III MINI PEN NEEDLES 31G X 5 MM MISC USE AS DIRECTED EVERY DAY 100 each 0  . B-D UF III MINI PEN NEEDLES 31G X 5 MM MISC USE AS DIRECTED EVERY DAY 100 each 2  . carvedilol (COREG) 25 MG tablet Take 1 tablet (25 mg total) by mouth 2 (two) times daily. 180 tablet 3  .  ferrous sulfate 325 (65 FE) MG tablet Take 325 mg by mouth every other day.    . Ginkgo Biloba (GINKOBA) 40 MG TABS Take 1 tablet by mouth every other day.    Marland Kitchen glipiZIDE (GLUCOTROL XL) 5 MG 24 hr tablet TAKE 1 TABLET BY MOUTH TWICE A DAY 180 tablet 1  . Insulin Glargine (LANTUS SOLOSTAR) 100 UNIT/ML Solostar Pen Inject 6-10 Units into the skin daily at 10 pm. May adjust as needed 15 mL 1  . Lancets MISC Use as instructed  twice a day 100 each 2  . latanoprost (XALATAN) 0.005 % ophthalmic solution Place 1 drop into both eyes at bedtime.     Marland Kitchen losartan-hydrochlorothiazide (HYZAAR) 100-25 MG tablet TAKE 1 TABLET BY MOUTH EVERY DAY 90 tablet 1  . metFORMIN (GLUCOPHAGE) 1000 MG tablet TAKE 1 TABLET BY MOUTH TWICE A DAY WITH MEALS 180 tablet 1  . Multiple Vitamin (MULTIVITAMIN WITH MINERALS) TABS Take 1 tablet by mouth daily.     . Omega-3 Fatty Acids (FISH OIL BURP-LESS PO) Take 1 capsule by mouth every other day.     . ONE TOUCH ULTRA TEST test strip USE AS INSTRUCTED TWICE A DAY 100 each 3   No current facility-administered medications on file prior to visit.     Review of Systems:  As per HPI- otherwise negative.   Physical Examination: Vitals:   11/18/18 0913  BP: (!) 160/80  Pulse: 86  Resp: 16  Temp: 98.4 F (36.9 C)  SpO2: 99%   Vitals:   11/18/18 0913  Weight: 221 lb (100.2 kg)  Height: '6\' 2"'$  (1.88 m)   Body mass index is 28.37 kg/m. Ideal Body Weight: Weight in (lb) to have BMI = 25: 194.3  GEN: WDWN, NAD, Non-toxic, A & O x 3, looks well, tall build HEENT: Atraumatic, Normocephalic. Neck supple. No masses, No LAD. Ears and Nose: No external deformity. CV: RRR, No M/G/R. No JVD. No thrill. No extra heart sounds. PULM: CTA B, no wheezes, crackles, rhonchi. No retractions. No resp. distress. No accessory muscle use. ABD: S, NT, ND, +BS. No rebound. No HSM. EXTR: No c/c/e NEURO Normal gait.  PSYCH: Normally interactive. Conversant. Not depressed or anxious appearing.   Calm demeanor.  Noted a node in his right posterior cervical chain which seems enlarged- approx 2cm length on palpation.   Assessment and Plan: Essential hypertension - Plan: Basic metabolic panel, losartan (COZAAR) 100 MG tablet, hydrochlorothiazide (HYDRODIURIL) 25 MG tablet  Mixed hyperlipidemia  Uncontrolled type 2 diabetes mellitus without complication, with long-term current use of insulin (HCC) - Plan: Hemoglobin D5H, Basic metabolic panel  LAD (lymphadenopathy), posterior cervical - Plan: US Soft Tissue Head/Neck  Will try a different hctz/ losartan dosing schedule. However if his BP remains high will plan to try triamterene/hctz for him.   He will keep me posted  Check on DM today Set up [place] of possible node Discussed shingrix   Signed Lamar Blinks, MD  Received his labs Results for orders placed or performed in visit on 11/18/18  Hemoglobin A1c  Result Value Ref Range   Hgb A1c MFr Bld 7.8 (H) 4.6 - 6.5 %  Basic metabolic panel  Result Value Ref Range   Sodium 134 (L) 135 - 145 mEq/L   Potassium 4.4 3.5 - 5.1 mEq/L   Chloride 99 96 - 112 mEq/L   CO2 28 19 - 32 mEq/L   Glucose, Bld 153 (H) 70 - 99 mg/dL   BUN 29 (H) 6 - 23 mg/dL   Creatinine, Ser 1.23 0.40 - 1.50 mg/dL   Calcium 9.5 8.4 - 10.5 mg/dL   GFR 74.17 >60.00 mL/min   Message to pt - how about we exchange glipizide for an SLGT2?  Await his response Would like to get A1c to 7.5

## 2018-11-18 ENCOUNTER — Ambulatory Visit (INDEPENDENT_AMBULATORY_CARE_PROVIDER_SITE_OTHER): Payer: Medicare Other | Admitting: Family Medicine

## 2018-11-18 ENCOUNTER — Encounter: Payer: Self-pay | Admitting: Family Medicine

## 2018-11-18 VITALS — BP 160/80 | HR 86 | Temp 98.4°F | Resp 16 | Ht 74.0 in | Wt 221.0 lb

## 2018-11-18 DIAGNOSIS — I10 Essential (primary) hypertension: Secondary | ICD-10-CM | POA: Diagnosis not present

## 2018-11-18 DIAGNOSIS — IMO0001 Reserved for inherently not codable concepts without codable children: Secondary | ICD-10-CM

## 2018-11-18 DIAGNOSIS — R590 Localized enlarged lymph nodes: Secondary | ICD-10-CM

## 2018-11-18 DIAGNOSIS — Z794 Long term (current) use of insulin: Secondary | ICD-10-CM

## 2018-11-18 DIAGNOSIS — E782 Mixed hyperlipidemia: Secondary | ICD-10-CM | POA: Diagnosis not present

## 2018-11-18 DIAGNOSIS — E1165 Type 2 diabetes mellitus with hyperglycemia: Secondary | ICD-10-CM

## 2018-11-18 LAB — BASIC METABOLIC PANEL
BUN: 29 mg/dL — ABNORMAL HIGH (ref 6–23)
CALCIUM: 9.5 mg/dL (ref 8.4–10.5)
CO2: 28 meq/L (ref 19–32)
Chloride: 99 mEq/L (ref 96–112)
Creatinine, Ser: 1.23 mg/dL (ref 0.40–1.50)
GFR: 74.17 mL/min (ref >60.00)
Glucose, Bld: 153 mg/dL — ABNORMAL HIGH (ref 70–99)
POTASSIUM: 4.4 meq/L (ref 3.5–5.1)
SODIUM: 134 meq/L — AB (ref 135–145)

## 2018-11-18 LAB — HEMOGLOBIN A1C: Hgb A1c MFr Bld: 7.8 % — ABNORMAL HIGH (ref 4.6–6.5)

## 2018-11-18 MED ORDER — HYDROCHLOROTHIAZIDE 25 MG PO TABS: 25.0000 mg | ORAL_TABLET | Freq: Every day | ORAL | 3 refills | Status: DC

## 2018-11-18 MED ORDER — LOSARTAN POTASSIUM 100 MG PO TABS: 100.0000 mg | ORAL_TABLET | Freq: Every day | ORAL | 3 refills | Status: DC

## 2018-11-18 NOTE — Patient Instructions (Addendum)
Let's try splitting your losartan and HCTZ into it's separate components Take the HCTZ in the am and losartan in the evening  Let me know how your BP responds- please email me some numbers  If this is not working well for you we can add triamterene to your HCTZ   We will check on your diabetes control today Think about getting the shingrix vaccine for shingles with the [facility] or at your drug store We are going to get an ultrasound of the right posterior neck for you- I feel a node there and want to make sure it is ok

## 2018-11-19 ENCOUNTER — Ambulatory Visit (HOSPITAL_BASED_OUTPATIENT_CLINIC_OR_DEPARTMENT_OTHER)
Admission: RE | Admit: 2018-11-19 | Discharge: 2018-11-19 | Disposition: A | Discharge status: Home / Self Care | Payer: Medicare Other | Source: Ambulatory Visit | Attending: Family Medicine | Admitting: Family Medicine

## 2018-11-19 DIAGNOSIS — R590 Localized enlarged lymph nodes: Secondary | ICD-10-CM | POA: Diagnosis not present

## 2018-11-19 DIAGNOSIS — Z0389 Encounter for observation for other suspected diseases and conditions ruled out: Secondary | ICD-10-CM | POA: Diagnosis not present

## 2018-11-20 ENCOUNTER — Encounter: Payer: Self-pay | Admitting: Family Medicine

## 2018-12-08 LAB — CUP PACEART REMOTE DEVICE CHECK
Battery Remaining Longevity: 86 mo
Brady Statistic AP VP Percent: 11 %
Brady Statistic AP VS Percent: 1 %
Brady Statistic AS VS Percent: 1.5 %
Brady Statistic RA Percent Paced: 9.2 %
Date Time Interrogation Session: 20191021072144
Implantable Lead Implant Date: 20160822
Implantable Lead Implant Date: 20160822
Implantable Lead Implant Date: 20190118
Implantable Lead Location: 753859
Implantable Lead Location: 753860
Implantable Pulse Generator Implant Date: 20190118
Lead Channel Impedance Value: 1250 Ohm
Lead Channel Impedance Value: 430 Ohm
Lead Channel Impedance Value: 580 Ohm
Lead Channel Pacing Threshold Amplitude: 1 V
Lead Channel Pacing Threshold Amplitude: 1.875 V
Lead Channel Pacing Threshold Pulse Width: 0.5 ms
Lead Channel Pacing Threshold Pulse Width: 0.5 ms
Lead Channel Sensing Intrinsic Amplitude: 12 mV
Lead Channel Setting Pacing Amplitude: 1.875
Lead Channel Setting Pacing Amplitude: 2 V
Lead Channel Setting Pacing Amplitude: 2.875
Lead Channel Setting Pacing Pulse Width: 0.5 ms
Lead Channel Setting Pacing Pulse Width: 0.5 ms
Lead Channel Setting Sensing Sensitivity: 4 mV
MDC IDC LEAD LOCATION: 753858
MDC IDC MSMT BATTERY REMAINING PERCENTAGE: 95.5 %
MDC IDC MSMT BATTERY VOLTAGE: 3.01 V
MDC IDC MSMT LEADCHNL LV PACING THRESHOLD PULSEWIDTH: 0.5 ms
MDC IDC MSMT LEADCHNL RA PACING THRESHOLD AMPLITUDE: 0.875 V
MDC IDC MSMT LEADCHNL RA SENSING INTR AMPL: 1 mV
MDC IDC STAT BRADY AS VP PERCENT: 86 %
Pulse Gen Model: 3262
Pulse Gen Serial Number: 8983138

## 2019-01-03 ENCOUNTER — Other Ambulatory Visit: Payer: Self-pay | Admitting: Family Medicine

## 2019-01-05 ENCOUNTER — Other Ambulatory Visit: Payer: Self-pay | Admitting: Internal Medicine

## 2019-01-06 ENCOUNTER — Ambulatory Visit (INDEPENDENT_AMBULATORY_CARE_PROVIDER_SITE_OTHER): Payer: Medicare Other

## 2019-01-06 DIAGNOSIS — I441 Atrioventricular block, second degree: Secondary | ICD-10-CM

## 2019-01-06 DIAGNOSIS — R001 Bradycardia, unspecified: Secondary | ICD-10-CM

## 2019-01-07 LAB — CUP PACEART REMOTE DEVICE CHECK
Battery Remaining Longevity: 92 mo
Battery Remaining Percentage: 95.5 %
Brady Statistic AP VP Percent: 12 %
Brady Statistic AP VS Percent: 1 %
Brady Statistic AS VP Percent: 84 %
Brady Statistic AS VS Percent: 1.5 %
Brady Statistic RA Percent Paced: 10 %
Date Time Interrogation Session: 20200120070028
Implantable Lead Implant Date: 20160822
Implantable Lead Implant Date: 20160822
Implantable Lead Implant Date: 20190118
Implantable Lead Location: 753858
Implantable Lead Location: 753859
Implantable Lead Location: 753860
Implantable Pulse Generator Implant Date: 20190118
Lead Channel Impedance Value: 1225 Ohm
Lead Channel Impedance Value: 430 Ohm
Lead Channel Impedance Value: 580 Ohm
Lead Channel Pacing Threshold Amplitude: 1 V
Lead Channel Pacing Threshold Amplitude: 1.875 V
Lead Channel Pacing Threshold Pulse Width: 0.5 ms
Lead Channel Pacing Threshold Pulse Width: 0.5 ms
Lead Channel Pacing Threshold Pulse Width: 0.5 ms
Lead Channel Sensing Intrinsic Amplitude: 12 mV
Lead Channel Setting Pacing Amplitude: 1.875
Lead Channel Setting Pacing Amplitude: 2.875
Lead Channel Setting Pacing Pulse Width: 0.5 ms
Lead Channel Setting Pacing Pulse Width: 0.5 ms
Lead Channel Setting Sensing Sensitivity: 4 mV
MDC IDC MSMT BATTERY VOLTAGE: 3.01 V
MDC IDC MSMT LEADCHNL RA PACING THRESHOLD AMPLITUDE: 0.875 V
MDC IDC MSMT LEADCHNL RA SENSING INTR AMPL: 1.7 mV
MDC IDC SET LEADCHNL RV PACING AMPLITUDE: 2 V
Pulse Gen Model: 3262
Pulse Gen Serial Number: 8983138

## 2019-01-07 NOTE — Progress Notes (Signed)
Remote pacemaker transmission.   

## 2019-01-08 ENCOUNTER — Encounter: Payer: Medicare Other | Admitting: Internal Medicine

## 2019-01-08 ENCOUNTER — Ambulatory Visit: Payer: Medicare Other | Admitting: Internal Medicine

## 2019-01-08 ENCOUNTER — Encounter: Payer: Self-pay | Admitting: Internal Medicine

## 2019-01-08 VITALS — BP 168/94 | HR 72 | Ht 75.0 in | Wt 218.0 lb

## 2019-01-08 DIAGNOSIS — I5022 Chronic systolic (congestive) heart failure: Secondary | ICD-10-CM

## 2019-01-08 DIAGNOSIS — Z950 Presence of cardiac pacemaker: Secondary | ICD-10-CM | POA: Diagnosis not present

## 2019-01-08 DIAGNOSIS — E785 Hyperlipidemia, unspecified: Secondary | ICD-10-CM

## 2019-01-08 DIAGNOSIS — I459 Conduction disorder, unspecified: Secondary | ICD-10-CM

## 2019-01-08 MED ORDER — CARVEDILOL 25 MG PO TABS: 37.5000 mg | ORAL_TABLET | Freq: Two times a day (BID) | ORAL | 3 refills | Status: DC

## 2019-01-08 NOTE — Progress Notes (Signed)
HPI [name] returns today for ongoing evaluation of his PPM after undergoing Biv upgrade just over 12 months ago. In the interim, he has done well. He is back to working regularly. He works in Engineer, production. No syncope. He admits to dietary indiscretion with sodium. He notes that his bp has been high. Allergies  Allergen Reactions  . Amlodipine Swelling  . Crestor [Rosuvastatin] Other (See Comments)    Per pt unable to focus     Current Outpatient Medications  Medication Sig Dispense Refill  . atorvastatin (LIPITOR) 20 MG tablet Take 1 tablet (20 mg total) by mouth daily. Please keep upcoming appt for future refills. Thank you. 90 tablet 0  . B-D UF III MINI PEN NEEDLES 31G X 5 MM MISC USE AS DIRECTED EVERY DAY 100 each 0  . B-D UF III MINI PEN NEEDLES 31G X 5 MM MISC USE AS DIRECTED EVERY DAY 100 each 2  . carvedilol (COREG) 25 MG tablet Take 1 tablet (25 mg total) by mouth 2 (two) times daily. Please keep upcoming appt for future refills. Thank you. 180 tablet 0  . ferrous sulfate 325 (65 FE) MG tablet Take 325 mg by mouth every other day.    . Ginkgo Biloba (GINKOBA) 40 MG TABS Take 1 tablet by mouth every other day.    Marland Kitchen glipiZIDE (GLUCOTROL XL) 5 MG 24 hr tablet TAKE 1 TABLET BY MOUTH TWICE A DAY 180 tablet 1  . hydrochlorothiazide (HYDRODIURIL) 25 MG tablet Take 1 tablet (25 mg total) by mouth daily. 90 tablet 3  . Insulin Glargine (LANTUS SOLOSTAR) 100 UNIT/ML Solostar Pen Inject 6-10 Units into the skin daily at 10 pm. May adjust as needed 15 mL 1  . Lancets MISC Use as instructed twice a day 100 each 2  . latanoprost (XALATAN) 0.005 % ophthalmic solution Place 1 drop into both eyes at bedtime.     Marland Kitchen losartan (COZAAR) 100 MG tablet Take 1 tablet (100 mg total) by mouth daily. 90 tablet 3  . metFORMIN (GLUCOPHAGE) 1000 MG tablet TAKE 1 TABLET BY MOUTH TWICE A DAY WITH MEALS 180 tablet 1  . Multiple Vitamin (MULTIVITAMIN WITH MINERALS) TABS Take 1 tablet by  mouth daily.     . Omega-3 Fatty Acids (FISH OIL BURP-LESS PO) Take 1 capsule by mouth every other day.     . ONE TOUCH ULTRA TEST test strip USE AS INSTRUCTED TWICE A DAY 100 each 3   No current facility-administered medications for this visit.      Past Medical History:  Diagnosis Date  . Anemia   . Cataract   . Chronic systolic (congestive) heart failure ([place])   . Diabetes mellitus   . GERD (gastroesophageal reflux disease)   . Hyperlipidemia   . Hypertension   . MGUS (monoclonal gammopathy of unknown significance)   . Multiple myeloma ([place]) 02/2013   normal cytogenetics and FISH panel on 03/11/2013.   . Osteoarthritis   . Presence of permanent cardiac pacemaker   . Right bundle branch block   . Syncope     ROS:   All systems reviewed and negative except as noted in the HPI.   Past Surgical History:  Procedure Laterality Date  . BIV UPGRADE  01/04/2018  . BIV UPGRADE N/A 01/04/2018   Procedure: BIVP UPGRADE;  Surgeon: [name], MD;  Location: [place] [facility];  Service: Cardiovascular;  Laterality: N/A;  . EP IMPLANTABLE DEVICE N/A 08/09/2015  Procedure: Pacemaker Implant;  Surgeon: [name], MD;  Location: [place] [facility];  Service: Cardiovascular;  Laterality: N/A;  . EYE SURGERY       Family History  Problem Relation Age of Onset  . Hypertension Mother   . Cancer Mother   . Stroke Mother   . Hypertension Sister   . Hypertension Brother   . Hypertension Maternal Grandmother      Social History   Socioeconomic History  . Marital status: Married    Spouse name: Not on file  . Number of children: 3  . Years of education: Not on file  . Highest education level: Not on file  Occupational History  . Not on file  Social Needs  . Financial resource strain: Not on file  . Food insecurity:    Worry: Not on file    Inability: Not on file  . Transportation needs:    Medical: Not on file    Non-medical: Not on file  Tobacco Use  . Smoking  status: Former Smoker    Packs/day: 2.00    Years: 35.00    Pack years: 70.00    Types: Cigarettes    Start date: 03/26/1963    Last attempt to quit: 12/18/1997    Years since quitting: 21.0  . Smokeless tobacco: Never Used  . Tobacco comment: quit 15 years  Substance and Sexual Activity  . Alcohol use: No    Alcohol/week: 0.0 standard drinks  . Drug use: No  . Sexual activity: Yes    Birth control/protection: None  Lifestyle  . Physical activity:    Days per week: Not on file    Minutes per session: Not on file  . Stress: Not on file  Relationships  . Social connections:    Talks on phone: Not on file    Gets together: Not on file    Attends religious service: Not on file    Active member of club or organization: Not on file    Attends meetings of clubs or organizations: Not on file    Relationship status: Not on file  . Intimate partner violence:    Fear of current or ex partner: Not on file    Emotionally abused: Not on file    Physically abused: Not on file    Forced sexual activity: Not on file  Other Topics Concern  . Not on file  Social History Narrative   Married   Building Maintenance     BP (!) 168/94   Pulse 72   Ht _0  (1.905 m)   Wt 218 lb (98.9 kg)   SpO2 99%   BMI 27.25 kg/m   Physical Exam:  Well appearing NAD HEENT: Unremarkable Neck:  No JVD, no thyromegally Lymphatics:  No adenopathy Back:  No CVA tenderness Lungs:  Clear with no wheezes HEART:  Regular rate rhythm, no murmurs, no rubs, no clicks Abd:  soft, positive bowel sounds, no organomegally, no rebound, no guarding Ext:  2 plus pulses, no edema, no cyanosis, no clubbing Skin:  No rashes no nodules Neuro:  CN II through XII intact, motor grossly intact  EKG - nsr with biv pacing  DEVICE  Normal device function.  See PaceArt for details.   Assess/Plan: 1. Chronic systolic heart failure - his symptoms are class 2. He is still working. He is encouraged to maintain a low sodium  diet. 2. HTN - his blood pressure is high. I have recommended he increase coreg to  37.5 bid.  3. PPM - his St. Jude DDD biv pacemaker is working normally.  4. Dyslipidemia - he will continue his statin therapy.  Michael Wiggins.D.

## 2019-01-08 NOTE — Patient Instructions (Addendum)
Medication Instructions:  Your physician has recommended you make the following change in your medication:   1.  Increase your carvedilol 25 mg---Take 1.5 tablets (37.5 mg) by mouth twice a day  2.  Decrease your salt intake   Labwork: None ordered.  Testing/Procedures: None ordered.  Follow-Up: Your physician wants you to follow-up in: 6 months with Dr. Lovena Le.   You will receive a reminder letter in the mail two months in advance. If you don't receive a letter, please call our office to schedule the follow-up appointment.  Remote monitoring is used to monitor your Pacemaker from home. This monitoring reduces the number of office visits required to check your device to one time per year. It allows [place] to keep an eye on the functioning of your device to ensure it is working properly. You are scheduled for a device check from home on 04/07/2019. You may send your transmission at any time that day. If you have a wireless device, the transmission will be sent automatically. After your physician reviews your transmission, you will receive a postcard with your next transmission date.  Any Other Special Instructions Will Be Listed Below (If Applicable).  If you need a refill on your cardiac medications before your next appointment, please call your pharmacy.

## 2019-01-09 LAB — CUP PACEART INCLINIC DEVICE CHECK
Battery Remaining Longevity: 91 mo
Battery Voltage: 2.99 V
Brady Statistic RA Percent Paced: 10 %
Brady Statistic RV Percent Paced: 97 %
Date Time Interrogation Session: 20200122211215
Implantable Lead Implant Date: 20160822
Implantable Lead Implant Date: 20160822
Implantable Lead Implant Date: 20190118
Implantable Lead Location: 753858
Implantable Lead Location: 753859
Implantable Lead Location: 753860
Implantable Pulse Generator Implant Date: 20190118
Lead Channel Impedance Value: 1187.5 Ohm
Lead Channel Impedance Value: 400 Ohm
Lead Channel Impedance Value: 550 Ohm
Lead Channel Pacing Threshold Amplitude: 0.75 V
Lead Channel Pacing Threshold Amplitude: 1.125 V
Lead Channel Pacing Threshold Amplitude: 1.625 V
Lead Channel Pacing Threshold Pulse Width: 0.5 ms
Lead Channel Pacing Threshold Pulse Width: 0.5 ms
Lead Channel Pacing Threshold Pulse Width: 0.5 ms
Lead Channel Sensing Intrinsic Amplitude: 12 mV
Lead Channel Setting Pacing Amplitude: 1.75 V
Lead Channel Setting Pacing Amplitude: 2.125
Lead Channel Setting Pacing Amplitude: 2.625
Lead Channel Setting Pacing Pulse Width: 0.5 ms
Lead Channel Setting Pacing Pulse Width: 0.5 ms
Lead Channel Setting Sensing Sensitivity: 4 mV
MDC IDC MSMT LEADCHNL RA SENSING INTR AMPL: 1.8 mV
Pulse Gen Model: 3262
Pulse Gen Serial Number: 8983138

## 2019-01-16 DIAGNOSIS — E119 Type 2 diabetes mellitus without complications: Secondary | ICD-10-CM | POA: Diagnosis not present

## 2019-02-05 ENCOUNTER — Other Ambulatory Visit: Payer: Self-pay | Admitting: Internal Medicine

## 2019-02-08 NOTE — Progress Notes (Addendum)
[facility] [place], [place], [place] [address] [phone]  Date:  02/12/2019   Name:  Michael Kessinger Sr.   DOB:  [birth year]   MRN:  [id number]  PCP:  Darreld Mclean, MD    Chief Complaint: Follow-up (6 month f/u)   History of Present Illness:  Michael Mustard Sr. is a 73 y.o. very pleasant male patient who presents with the following:  Here today for a 76-monthfollow up visit History of CHF and heart block, with pacemaker He also has stable multiple myeloma, diabetes, hyperlipidemia, proteinuria, iron deficiency last requiring an infusion in April 2015 I also referred him to urology last year, for increased PSA Nephrology follows his proteinuria and blood pressure Renal function, GFR look good His last A1c showed pretty good control, at 7.8%-would like to get him under 7.5% I had suggested exchanging his glipizide for another medication, but he had not yet replied to me  He saw his oncologist in October, he notes that he has IgG kappa smoldering myeloma and iron deficiency anemia Current treatment plan is observation  Visit with cardiology last month- they recommended increase in carvedilol to 37.5 BID He notes that his home BP is generally "pretty good"- he brings a list with him today  Readings since he increase his blood pressure medication: 134/89, 130/80, 140/88, 125/78, 140/79, 149/89, 142/90, 144/79, 119/79, 154/91 His glucose has been looking quite good.  Home readings here no higher than 134 over the last month  Flu shot is up-to-date  Lipitor 20 Carvedilol 25, 1.5 tablets twice daily Iron p.o. every other day Glipizide HCTZ;  Low-dose Lantus Losartan Metformin Omega-3  Wearing compression socks helps him urinate less during the night He did not take his BP meds yet today-he was not sure if okay to take as he had not yet had breakfast  Eye exam: last month  Foot exam: now due   [facility]- they are working on seeing if he had any effects from agent orange exposure   Shingrix not done yet- suggested that he have this done at his drug store Lowest glucose reading at home 70; he will treat any sx of hypoglycemia with eating, no severe hypoglycemia  Lab Results  Component Value Date   HGBA1C 7.8 (H) 11/18/2018     Patient Active Problem List   Diagnosis Date Noted  . Chronic systolic (congestive) heart failure ([facility] 01/04/2018  . Pacemaker 12/08/2015  . Mobitz type 2 second degree heart block 08/08/2015  . Right bundle branch block 12/16/2014  . Syncope 12/16/2014  . Proteinuria due to type 2 diabetes mellitus ([facility] 11/12/2014  . Encounter for therapeutic drug monitoring 03/13/2013  . Multiple myeloma ([facility] 02/15/2013  . Edema 01/07/2013  . Diabetes mellitus due to underlying condition with other diabetic kidney complication ([facility]   . Hyperlipidemia   . Hypertension   . MGUS (monoclonal gammopathy of unknown significance)   . GERD (gastroesophageal reflux disease)     Past Medical History:  Diagnosis Date  . Anemia   . Cataract   . Chronic systolic (congestive) heart failure ([place]   . Diabetes mellitus   . GERD (gastroesophageal reflux disease)   . Hyperlipidemia   . Hypertension   . MGUS (monoclonal gammopathy of unknown significance)   . Multiple myeloma ([facility] 02/2013   normal cytogenetics and FISH panel on 03/11/2013.   . Osteoarthritis   .  Presence of permanent cardiac pacemaker   . Right bundle branch block   . Syncope     Past Surgical History:  Procedure Laterality Date  . BIV UPGRADE  01/04/2018  . BIV UPGRADE N/A 01/04/2018   Procedure: BIVP UPGRADE;  Surgeon: [name], MD;  Location: [facility];  Service: Cardiovascular;  Laterality: N/A;  . EP IMPLANTABLE DEVICE N/A 08/09/2015   Procedure: Pacemaker Implant;  Surgeon: [name], MD;  Location: [facility];  Service: Cardiovascular;   Laterality: N/A;  . EYE SURGERY      Social History   Tobacco Use  . Smoking status: Former Smoker    Packs/day: 2.00    Years: 35.00    Pack years: 70.00    Types: Cigarettes    Start date: 03/26/1963    Last attempt to quit: 12/18/1997    Years since quitting: 21.1  . Smokeless tobacco: Never Used  . Tobacco comment: quit 15 years  Substance Use Topics  . Alcohol use: No    Alcohol/week: 0.0 standard drinks  . Drug use: No    Family History  Problem Relation Age of Onset  . Hypertension Mother   . Cancer Mother   . Stroke Mother   . Hypertension Sister   . Hypertension Brother   . Hypertension Maternal Grandmother     Allergies  Allergen Reactions  . Amlodipine Swelling  . Crestor [Rosuvastatin] Other (See Comments)    Per pt unable to focus    Medication list has been reviewed and updated.  Current Outpatient Medications on File Prior to Visit  Medication Sig Dispense Refill  . atorvastatin (LIPITOR) 20 MG tablet Take 1 tablet (20 mg total) by mouth daily. Please keep upcoming appt for future refills. Thank you. 90 tablet 0  . B-D UF III MINI PEN NEEDLES 31G X 5 MM MISC USE AS DIRECTED EVERY DAY 100 each 0  . B-D UF III MINI PEN NEEDLES 31G X 5 MM MISC USE AS DIRECTED EVERY DAY 100 each 2  . carvedilol (COREG) 25 MG tablet Take 1.5 tablets (37.5 mg total) by mouth 2 (two) times daily. 270 tablet 3  . ferrous sulfate 325 (65 FE) MG tablet Take 325 mg by mouth every other day.    . Ginkgo Biloba (GINKOBA) 40 MG TABS Take 1 tablet by mouth every other day.    Marland Kitchen glipiZIDE (GLUCOTROL XL) 5 MG 24 hr tablet TAKE 1 TABLET BY MOUTH TWICE A DAY 180 tablet 1  . hydrochlorothiazide (HYDRODIURIL) 25 MG tablet Take 1 tablet (25 mg total) by mouth daily. 90 tablet 3  . Insulin Glargine (LANTUS SOLOSTAR) 100 UNIT/ML Solostar Pen Inject 6-10 Units into the skin daily at 10 pm. May adjust as needed 15 mL 1  . Lancets MISC Use as instructed twice a day 100 each 2  . latanoprost  (XALATAN) 0.005 % ophthalmic solution Place 1 drop into both eyes at bedtime.     Marland Kitchen losartan (COZAAR) 100 MG tablet Take 1 tablet (100 mg total) by mouth daily. 90 tablet 3  . metFORMIN (GLUCOPHAGE) 1000 MG tablet TAKE 1 TABLET BY MOUTH TWICE A DAY WITH MEALS 180 tablet 1  . Multiple Vitamin (MULTIVITAMIN WITH MINERALS) TABS Take 1 tablet by mouth daily.     . Omega-3 Fatty Acids (FISH OIL BURP-LESS PO) Take 1 capsule by mouth every other day.     . ONE TOUCH ULTRA TEST test strip USE AS INSTRUCTED TWICE A  DAY 100 each 3   No current facility-administered medications on file prior to visit.     Review of Systems:  As per HPI- otherwise negative.  No fever or chills No CP or SOB Physical Examination: Vitals:   02/12/19 0959  BP: (!) 186/102  Pulse: 88  Resp: 14  SpO2: 96%   Vitals:   02/12/19 0959  Weight: 218 lb (98.9 kg)  Height: _0  (1.905 m)   Body mass index is 27.25 kg/m. Ideal Body Weight: Weight in (lb) to have BMI = 25: 199.6  GEN: WDWN, NAD, Non-toxic, A & O x 3, looks well, tall build HEENT: Atraumatic, Normocephalic. Neck supple. No masses, No LAD. Ears and Nose: No external deformity. CV: RRR, No M/G/R. No JVD. No thrill. No extra heart sounds. PULM: CTA B, no wheezes, crackles, rhonchi. No retractions. No resp. distress. No accessory muscle use. ABD: S, NT, ND EXTR: No c/c/e NEURO Normal gait.  PSYCH: Normally interactive. Conversant. Not depressed or anxious appearing.  Calm demeanor.  Foot exam is normal, except his toenails are too long.  I recommended that he have these professionally trimmed if he is unable to do at home   Assessment and Plan: Mixed hyperlipidemia  Uncontrolled type 2 diabetes mellitus without complication, with long-term current use of insulin ([place]) - Plan: Hemoglobin C9F, Basic metabolic panel  Essential hypertension  Multiple myeloma, remission status unspecified ([place])  Cardiac pacemaker in situ  Proteinuria due to type 2  diabetes mellitus ([place])  Following up today.  His blood pressure is still sometimes high, even with increase of carvedilol.  I will touch base with Dr. Lovena Le about any changes he might like to make to his medications. His blood pressure is quite high today, but he has not yet taken his medications.  Advised him to take these right away when he gets home Recommended that he get Shingrix at his pharmacy A1c pending today.  He is using glipizide, low-dose Lantus, metformin Continue Lipitor for hyperlipidemia  Plan to recheck here in 4 months  Signed Lamar Blinks, MD  Received his labs, as below Message to patient  Results for orders placed or performed in visit on 02/12/19  Hemoglobin A1c  Result Value Ref Range   Hgb A1c MFr Bld 7.7 (H) 4.6 - 6.5 %  Basic metabolic panel  Result Value Ref Range   Sodium 136 135 - 145 mEq/L   Potassium 4.1 3.5 - 5.1 mEq/L   Chloride 101 96 - 112 mEq/L   CO2 28 19 - 32 mEq/L   Glucose, Bld 131 (H) 70 - 99 mg/dL   BUN 22 6 - 23 mg/dL   Creatinine, Ser 1.14 0.40 - 1.50 mg/dL   Calcium 9.1 8.4 - 10.5 mg/dL   GFR 76.13 >60.00 mL/min

## 2019-02-12 ENCOUNTER — Ambulatory Visit (INDEPENDENT_AMBULATORY_CARE_PROVIDER_SITE_OTHER): Payer: Medicare Other | Admitting: Family Medicine

## 2019-02-12 ENCOUNTER — Encounter: Payer: Self-pay | Admitting: Family Medicine

## 2019-02-12 VITALS — BP 170/90 | HR 88 | Resp 14 | Ht 75.0 in | Wt 218.0 lb

## 2019-02-12 DIAGNOSIS — C9000 Multiple myeloma not having achieved remission: Secondary | ICD-10-CM

## 2019-02-12 DIAGNOSIS — Z950 Presence of cardiac pacemaker: Secondary | ICD-10-CM | POA: Diagnosis not present

## 2019-02-12 DIAGNOSIS — E1129 Type 2 diabetes mellitus with other diabetic kidney complication: Secondary | ICD-10-CM

## 2019-02-12 DIAGNOSIS — E782 Mixed hyperlipidemia: Secondary | ICD-10-CM | POA: Diagnosis not present

## 2019-02-12 DIAGNOSIS — Z794 Long term (current) use of insulin: Secondary | ICD-10-CM

## 2019-02-12 DIAGNOSIS — E1165 Type 2 diabetes mellitus with hyperglycemia: Secondary | ICD-10-CM

## 2019-02-12 DIAGNOSIS — IMO0001 Reserved for inherently not codable concepts without codable children: Secondary | ICD-10-CM

## 2019-02-12 DIAGNOSIS — R809 Proteinuria, unspecified: Secondary | ICD-10-CM

## 2019-02-12 DIAGNOSIS — I10 Essential (primary) hypertension: Secondary | ICD-10-CM

## 2019-02-12 LAB — BASIC METABOLIC PANEL
BUN: 22 mg/dL (ref 6–23)
CO2: 28 mEq/L (ref 19–32)
Calcium: 9.1 mg/dL (ref 8.4–10.5)
Chloride: 101 mEq/L (ref 96–112)
Creatinine, Ser: 1.14 mg/dL (ref 0.40–1.50)
GFR: 76.13 mL/min (ref >60.00)
Glucose, Bld: 131 mg/dL — ABNORMAL HIGH (ref 70–99)
Potassium: 4.1 mEq/L (ref 3.5–5.1)
Sodium: 136 mEq/L (ref 135–145)

## 2019-02-12 LAB — HEMOGLOBIN A1C: Hgb A1c MFr Bld: 7.7 % — ABNORMAL HIGH (ref 4.6–6.5)

## 2019-02-12 NOTE — Patient Instructions (Addendum)
You might want to get the shingles vaccine at your drug store Let's plan to recheck in 4 months A1c pending today I'll touch base with Dr Lovena Le regarding your blood pressure numbers

## 2019-02-14 ENCOUNTER — Telehealth: Payer: Self-pay

## 2019-02-14 NOTE — Telephone Encounter (Signed)
Did you call the patient, I see no documentation?   Copied from [place] [id number]. Topic: General - Call Back - No Documentation >> Feb 14, 2019  3:57 PM Michael Wiggins wrote: Reason for CRM: patient is calling and said he missed a call, he thinks it may be for labs?

## 2019-02-15 ENCOUNTER — Encounter: Payer: Self-pay | Admitting: Family Medicine

## 2019-02-28 ENCOUNTER — Other Ambulatory Visit: Payer: Self-pay

## 2019-02-28 ENCOUNTER — Ambulatory Visit (HOSPITAL_BASED_OUTPATIENT_CLINIC_OR_DEPARTMENT_OTHER)
Admission: RE | Admit: 2019-02-28 | Discharge: 2019-02-28 | Disposition: A | Discharge status: Home / Self Care | Payer: Medicare Other | Source: Ambulatory Visit | Attending: Medical | Admitting: Medical

## 2019-02-28 ENCOUNTER — Ambulatory Visit (INDEPENDENT_AMBULATORY_CARE_PROVIDER_SITE_OTHER): Payer: Medicare Other | Admitting: Medical

## 2019-02-28 ENCOUNTER — Encounter: Payer: Self-pay | Admitting: Medical

## 2019-02-28 VITALS — BP 147/82 | HR 61 | Temp 98.0°F | Resp 16 | Ht 75.0 in | Wt 216.2 lb

## 2019-02-28 DIAGNOSIS — J40 Bronchitis, not specified as acute or chronic: Secondary | ICD-10-CM | POA: Diagnosis not present

## 2019-02-28 DIAGNOSIS — R05 Cough: Secondary | ICD-10-CM | POA: Diagnosis not present

## 2019-02-28 DIAGNOSIS — J329 Chronic sinusitis, unspecified: Secondary | ICD-10-CM | POA: Diagnosis not present

## 2019-02-28 DIAGNOSIS — R0981 Nasal congestion: Secondary | ICD-10-CM | POA: Diagnosis not present

## 2019-02-28 DIAGNOSIS — R059 Cough, unspecified: Secondary | ICD-10-CM

## 2019-02-28 MED ORDER — ALBUTEROL SULFATE HFA 108 (90 BASE) MCG/ACT IN AERS: 2.0000 | INHALATION_SPRAY | Freq: Four times a day (QID) | RESPIRATORY_TRACT | 2 refills | Status: DC | PRN

## 2019-02-28 MED ORDER — BENZONATATE 100 MG PO CAPS: 100.0000 mg | ORAL_CAPSULE | Freq: Three times a day (TID) | ORAL | 0 refills | Status: DC | PRN

## 2019-02-28 MED ORDER — AZITHROMYCIN 250 MG PO TABS: ORAL_TABLET | ORAL | 0 refills | Status: DC

## 2019-02-28 MED ORDER — FLUTICASONE PROPIONATE 50 MCG/ACT NA SUSP: 2.0000 | Freq: Every day | NASAL | 1 refills | Status: DC

## 2019-02-28 NOTE — Progress Notes (Signed)
Subjective:    Patient ID: Michael Swart Sr., male    DOB: Apr 15, 1946, 73 y.o.   MRN: [id number]  HPI  Pt in with some recent mild cough, nasal congestion, faint sinus pressure, intermittent sneezing and some mild wheezing. Pt states these symptoms have been going on for 2 weeks. Pt has some mild wheeze. He gets some occasional productive cough.   His symptoms have waxed and waned.  No diffuse myalgias.  Temp the other day when he checked was 97.1  Sugar level this am was 108.  Hx of remote smoking. He quit smoking when he was 73 yo.   Review of Systems  Constitutional: Positive for fatigue. Negative for chills and fever.       Mild fatigue.  HENT: Positive for congestion, sinus pressure and sinus pain. Negative for sore throat and tinnitus.   Respiratory: Positive for cough and wheezing. Negative for chest tightness and shortness of breath.        Possible minimal wheeze recently.  Cardiovascular: Negative for chest pain and palpitations.  Gastrointestinal: Negative for abdominal pain.  Genitourinary: Negative for flank pain.  Musculoskeletal: Negative for back pain.  Neurological: Negative for dizziness, numbness and headaches.  Hematological: Negative for adenopathy. Does not bruise/bleed easily.  Psychiatric/Behavioral: Negative for behavioral problems and confusion.   Past Medical History:  Diagnosis Date  . Anemia   . Cataract   . Chronic systolic (congestive) heart failure ([place])   . Diabetes mellitus   . GERD (gastroesophageal reflux disease)   . Hyperlipidemia   . Hypertension   . MGUS (monoclonal gammopathy of unknown significance)   . Multiple myeloma ([place]) 02/2013   normal cytogenetics and FISH panel on 03/11/2013.   . Osteoarthritis   . Presence of permanent cardiac pacemaker   . Right bundle branch block   . Syncope      Social History   Socioeconomic History  . Marital status: Married    Spouse name: Not on file  . Number of children: 3  . Years of  education: Not on file  . Highest education level: Not on file  Occupational History  . Not on file  Social Needs  . Financial resource strain: Not on file  . Food insecurity:    Worry: Not on file    Inability: Not on file  . Transportation needs:    Medical: Not on file    Non-medical: Not on file  Tobacco Use  . Smoking status: Former Smoker    Packs/day: 2.00    Years: 35.00    Pack years: 70.00    Types: Cigarettes    Start date: 03/26/1963    Last attempt to quit: 12/18/1997    Years since quitting: 21.2  . Smokeless tobacco: Never Used  . Tobacco comment: quit 15 years  Substance and Sexual Activity  . Alcohol use: No    Alcohol/week: 0.0 standard drinks  . Drug use: No  . Sexual activity: Yes    Birth control/protection: None  Lifestyle  . Physical activity:    Days per week: Not on file    Minutes per session: Not on file  . Stress: Not on file  Relationships  . Social connections:    Talks on phone: Not on file    Gets together: Not on file    Attends religious service: Not on file    Active member of club or organization: Not on file    Attends meetings of clubs or organizations: Not on  file    Relationship status: Not on file  . Intimate partner violence:    Fear of current or ex partner: Not on file    Emotionally abused: Not on file    Physically abused: Not on file    Forced sexual activity: Not on file  Other Topics Concern  . Not on file  Social History Narrative   Married   Building Maintenance    Past Surgical History:  Procedure Laterality Date  . BIV UPGRADE  01/04/2018  . BIV UPGRADE N/A 01/04/2018   Procedure: BIVP UPGRADE;  Surgeon: [name], MD;  Location: [facility];  Service: Cardiovascular;  Laterality: N/A;  . EP IMPLANTABLE DEVICE N/A 08/09/2015   Procedure: Pacemaker Implant;  Surgeon: [name], MD;  Location: [facility];  Service: Cardiovascular;  Laterality: N/A;  . EYE SURGERY      Family History   Problem Relation Age of Onset  . Hypertension Mother   . Cancer Mother   . Stroke Mother   . Hypertension Sister   . Hypertension Brother   . Hypertension Maternal Grandmother     Allergies  Allergen Reactions  . Amlodipine Swelling  . Crestor [Rosuvastatin] Other (See Comments)    Per pt unable to focus    Current Outpatient Medications on File Prior to Visit  Medication Sig Dispense Refill  . atorvastatin (LIPITOR) 20 MG tablet Take 1 tablet (20 mg total) by mouth daily. Please keep upcoming appt for future refills. Thank you. 90 tablet 0  . B-D UF III MINI PEN NEEDLES 31G X 5 MM MISC USE AS DIRECTED EVERY DAY 100 each 0  . B-D UF III MINI PEN NEEDLES 31G X 5 MM MISC USE AS DIRECTED EVERY DAY 100 each 2  . carvedilol (COREG) 25 MG tablet Take 1.5 tablets (37.5 mg total) by mouth 2 (two) times daily. 270 tablet 3  . ferrous sulfate 325 (65 FE) MG tablet Take 325 mg by mouth every other day.    . Ginkgo Biloba (GINKOBA) 40 MG TABS Take 1 tablet by mouth every other day.    Marland Kitchen glipiZIDE (GLUCOTROL XL) 5 MG 24 hr tablet TAKE 1 TABLET BY MOUTH TWICE A DAY 180 tablet 1  . hydrochlorothiazide (HYDRODIURIL) 25 MG tablet Take 1 tablet (25 mg total) by mouth daily. 90 tablet 3  . Insulin Glargine (LANTUS SOLOSTAR) 100 UNIT/ML Solostar Pen Inject 6-10 Units into the skin daily at 10 pm. May adjust as needed 15 mL 1  . Lancets MISC Use as instructed twice a day 100 each 2  . latanoprost (XALATAN) 0.005 % ophthalmic solution Place 1 drop into both eyes at bedtime.     Marland Kitchen losartan (COZAAR) 100 MG tablet Take 1 tablet (100 mg total) by mouth daily. 90 tablet 3  . metFORMIN (GLUCOPHAGE) 1000 MG tablet TAKE 1 TABLET BY MOUTH TWICE A DAY WITH MEALS 180 tablet 1  . Multiple Vitamin (MULTIVITAMIN WITH MINERALS) TABS Take 1 tablet by mouth daily.     . Omega-3 Fatty Acids (FISH OIL BURP-LESS PO) Take 1 capsule by mouth every other day.     . ONE TOUCH ULTRA TEST test strip USE AS INSTRUCTED TWICE A DAY  100 each 3   No current facility-administered medications on file prior to visit.     BP (!) 147/82   Pulse 61   Temp 98 F (36.7 C) (Oral)   Resp 16   Ht '6\' 3"'$  (1.905 m)  Wt 216 lb 3.2 oz (98.1 kg)   SpO2 100%   BMI 27.02 kg/m       Objective:   Physical Exam  General  Mental Status - Alert. General Appearance - Well groomed. Not in acute distress.(during interview he coughed just at end briefly/lightly.  Skin Rashes- No Rashes.  HEENT Head- Normal. Ear Auditory Canal - Left- Normal. Right - Normal.Tympanic Membrane- Left- Normal. Right- Normal. Eye Sclera/Conjunctiva- Left- Normal. Right- Normal. Nose & Sinuses Nasal Mucosa- Left-  Boggy and Congested. Right-  Boggy and  Congested.Bilateral maxillary and frontal sinus pressure. Mouth & Throat Lips: Upper Lip- Normal: no dryness, cracking, pallor, cyanosis, or vesicular eruption. Lower Lip-Normal: no dryness, cracking, pallor, cyanosis or vesicular eruption. Buccal Mucosa- Bilateral- No Aphthous ulcers. Oropharynx- No Discharge or Erythema. Tonsils: Characteristics- Bilateral- No Erythema or Congestion. Size/Enlargement- Bilateral- No enlargement. Discharge- bilateral-None.  Neck Neck- Supple. No Masses.  Chest and Lung Exam Auscultation: Breath Sounds:-Clear even and unlabored.  Cardiovascular Auscultation:Rythm- Regular, rate and rhythm. Murmurs & Other Heart Sounds:Ausculatation of the heart reveal- No Murmurs.  Lymphatic Head & Neck General Head & Neck Lymphatics: Bilateral: Description- No Localized lymphadenopathy.       Assessment & Plan:  Overall your illness is been present for about 2 weeks.  At the outset sounded more like upper respiratory infection but now more sinusitis-like with mild bronchitis.  Also considering that you might have possible component of allergic rhinitis.  I prescribed you Flonase nasal spray for nasal congestion.  For cough, I prescribed benzonatate.  In addition sent  in a azithromycin antibiotic to your pharmacy.  Based on the duration of illness and persisting symptoms, I do want you to get chest x-ray downstairs today to make sure no walking pneumonia.  Making albuterol available for wheezing to use as needed.  Follow-up in 7 to 10 days or as needed.  Mackie Pai, PA-C

## 2019-02-28 NOTE — Patient Instructions (Signed)
Overall your illness is been present for about 2 weeks.  At the outset sounded more like upper respiratory infection but now more sinusitis-like with mild bronchitis.  Also considering that you might have possible component of allergic rhinitis.  I prescribed you Flonase nasal spray for nasal congestion.  For cough, I prescribed benzonatate.  In addition sent in a azithromycin antibiotic to your pharmacy.  Based on the duration of illness and persisting symptoms, I do want you to get chest x-ray downstairs today to make sure no walking pneumonia.  Making albuterol available for wheezing to use as needed.  Follow-up in 7 to 10 days or as needed.

## 2019-03-22 ENCOUNTER — Other Ambulatory Visit: Payer: Self-pay | Admitting: Medical

## 2019-03-31 ENCOUNTER — Other Ambulatory Visit: Payer: Self-pay | Admitting: Internal Medicine

## 2019-04-03 ENCOUNTER — Encounter: Payer: Self-pay | Admitting: Hematology & Oncology

## 2019-04-03 ENCOUNTER — Other Ambulatory Visit: Payer: Self-pay

## 2019-04-03 ENCOUNTER — Inpatient Hospital Stay (HOSPITAL_BASED_OUTPATIENT_CLINIC_OR_DEPARTMENT_OTHER): Payer: Medicare Other | Admitting: Hematology & Oncology

## 2019-04-03 ENCOUNTER — Inpatient Hospital Stay: Payer: Medicare Other | Attending: Hematology & Oncology

## 2019-04-03 VITALS — BP 161/89 | HR 67 | Temp 98.0°F | Resp 18 | Wt 220.0 lb

## 2019-04-03 DIAGNOSIS — D509 Iron deficiency anemia, unspecified: Secondary | ICD-10-CM | POA: Diagnosis not present

## 2019-04-03 DIAGNOSIS — Z79899 Other long term (current) drug therapy: Secondary | ICD-10-CM

## 2019-04-03 DIAGNOSIS — C9000 Multiple myeloma not having achieved remission: Secondary | ICD-10-CM | POA: Diagnosis not present

## 2019-04-03 DIAGNOSIS — Z7984 Long term (current) use of oral hypoglycemic drugs: Secondary | ICD-10-CM | POA: Diagnosis not present

## 2019-04-03 LAB — CBC WITH DIFFERENTIAL (CANCER CENTER ONLY)
Abs Immature Granulocytes: 0.03 10*3/uL (ref 0.00–0.07)
Basophils Absolute: 0.1 10*3/uL (ref 0.0–0.1)
Basophils Relative: 1 %
Eosinophils Absolute: 0.5 10*3/uL (ref 0.0–0.5)
Eosinophils Relative: 7 %
HCT: 35 % — ABNORMAL LOW (ref 39.0–52.0)
Hemoglobin: 11.4 g/dL — ABNORMAL LOW (ref 13.0–17.0)
Immature Granulocytes: 0 %
Lymphocytes Relative: 25 %
Lymphs Abs: 2 10*3/uL (ref 0.7–4.0)
MCH: 29.2 pg (ref 26.0–34.0)
MCHC: 32.6 g/dL (ref 30.0–36.0)
MCV: 89.5 fL (ref 80.0–100.0)
Monocytes Absolute: 0.8 10*3/uL (ref 0.1–1.0)
Monocytes Relative: 10 %
Neutro Abs: 4.5 10*3/uL (ref 1.7–7.7)
Neutrophils Relative %: 57 %
Platelet Count: 252 10*3/uL (ref 150–400)
RBC: 3.91 MIL/uL — ABNORMAL LOW (ref 4.22–5.81)
RDW: 14.5 % (ref 11.5–15.5)
WBC Count: 7.9 10*3/uL (ref 4.0–10.5)
nRBC: 0 % (ref 0.0–0.2)

## 2019-04-03 LAB — IRON AND TIBC
Iron: 37 ug/dL — ABNORMAL LOW (ref 42–163)
Saturation Ratios: 12 % — ABNORMAL LOW (ref 20–55)
TIBC: 300 ug/dL (ref 202–409)
UIBC: 263 ug/dL (ref 117–376)

## 2019-04-03 LAB — CMP (CANCER CENTER ONLY)
ALT: 18 U/L (ref 0–44)
AST: 16 U/L (ref 15–41)
Albumin: 3.8 g/dL (ref 3.5–5.0)
Alkaline Phosphatase: 64 U/L (ref 38–126)
Anion gap: 3 — ABNORMAL LOW (ref 5–15)
BUN: 22 mg/dL (ref 8–23)
CO2: 28 mmol/L (ref 22–32)
Calcium: 9.1 mg/dL (ref 8.9–10.3)
Chloride: 98 mmol/L (ref 98–111)
Creatinine: 1.26 mg/dL — ABNORMAL HIGH (ref 0.61–1.24)
GFR, Est AFR Am: >60 mL/min (ref >60)
GFR, Estimated: 56 mL/min — ABNORMAL LOW (ref >60)
Glucose, Bld: 111 mg/dL — ABNORMAL HIGH (ref 70–99)
Potassium: 4.1 mmol/L (ref 3.5–5.1)
Sodium: 129 mmol/L — ABNORMAL LOW (ref 135–145)
Total Bilirubin: 0.3 mg/dL (ref 0.3–1.2)
Total Protein: 7.4 g/dL (ref 6.5–8.1)

## 2019-04-03 LAB — FERRITIN: Ferritin: 72 ng/mL (ref 24–336)

## 2019-04-03 NOTE — Progress Notes (Signed)
Hematology and Oncology Follow Up Visit  Michael Wiggins Sr. [id number] May 02, 1946 73 y.o. 04/03/2019   Principle Diagnosis:   IgG kappa smoldering myeloma  Iron deficiency anemia  Current Therapy:    Observation     Interim History:  [name] is back for followup.  So far, he is doing quite well.  We see him every 6 months.  His blood sugars have been under very good control.  To me, this is always been his biggest issue.  As far as the smoldering myeloma goes, and when he was last seen in October 2019, his M spike was 1.3 g/dL.  His IgG level was 2092 mg/dL.  His kappa light chain was 7.5 mg/dL.  He is still working.  He still working full-time.  The coronavirus has not affected his work all that much.  He has had no cough.  He has had no rashes.  He had to have a new pacemaker placed.  This was a couple months ago.  It seems to be working quite well.  Overall, his performance status is ECOG 0.    Medications:  Current Outpatient Medications:  .  carvedilol (COREG) 25 MG tablet, Take 25 mg by mouth 2 (two) times daily with a meal. Take one tablet and a half, total of 37.5 mg twice a day., Disp: , Rfl:  .  albuterol (PROVENTIL HFA;VENTOLIN HFA) 108 (90 Base) MCG/ACT inhaler, Inhale 2 puffs into the lungs every 6 (six) hours as needed for wheezing or shortness of breath., Disp: 1 Inhaler, Rfl: 2 .  atorvastatin (LIPITOR) 20 MG tablet, TAKE 1 TABLET BY MOUTH DAILY. PLEASE KEEP UPCOMING APPT FOR FUTURE REFILLS. THANK YOU., Disp: 90 tablet, Rfl: 3 .  ferrous sulfate 325 (65 FE) MG tablet, Take 325 mg by mouth every other day., Disp: , Rfl:  .  fluticasone (FLONASE) 50 MCG/ACT nasal spray, Place 2 sprays into both nostrils daily as needed for allergies or rhinitis., Disp: 16 g, Rfl: 5 .  Ginkgo Biloba (GINKOBA) 40 MG TABS, Take 1 tablet by mouth every other day., Disp: , Rfl:  .  glipiZIDE (GLUCOTROL XL) 5 MG 24 hr tablet, TAKE 1 TABLET BY MOUTH TWICE A DAY, Disp: 180 tablet, Rfl:  1 .  hydrochlorothiazide (HYDRODIURIL) 25 MG tablet, Take 1 tablet (25 mg total) by mouth daily., Disp: 90 tablet, Rfl: 3 .  Insulin Glargine (LANTUS SOLOSTAR) 100 UNIT/ML Solostar Pen, Inject 6-10 Units into the skin daily at 10 pm. May adjust as needed, Disp: 15 mL, Rfl: 1 .  latanoprost (XALATAN) 0.005 % ophthalmic solution, Place 1 drop into both eyes at bedtime. , Disp: , Rfl:  .  losartan (COZAAR) 100 MG tablet, Take 1 tablet (100 mg total) by mouth daily., Disp: 90 tablet, Rfl: 3 .  metFORMIN (GLUCOPHAGE) 1000 MG tablet, TAKE 1 TABLET BY MOUTH TWICE A DAY WITH MEALS, Disp: 180 tablet, Rfl: 1 .  Multiple Vitamin (MULTIVITAMIN WITH MINERALS) TABS, Take 1 tablet by mouth daily. , Disp: , Rfl:  .  Omega-3 Fatty Acids (FISH OIL BURP-LESS PO), Take 1 capsule by mouth every other day. , Disp: , Rfl:   Allergies:  Allergies  Allergen Reactions  . Amlodipine Swelling  . Crestor [Rosuvastatin] Other (See Comments)    Per pt unable to focus    Past Medical History, Surgical history, Social history, and Family History were reviewed and updated.  Review of Systems: Review of Systems  Constitutional: Negative.   HENT: Negative.   Eyes:  Negative.   Respiratory: Negative.   Cardiovascular: Negative.   Gastrointestinal: Negative.   Genitourinary: Negative.   Musculoskeletal: Negative.   Skin: Negative.   Neurological: Negative.   Endo/Heme/Allergies: Negative.   Psychiatric/Behavioral: Negative.      Physical Exam:  weight is 220 lb (99.8 kg). His oral temperature is 98 F (36.7 C). His blood pressure is 161/89 (abnormal) and his pulse is 67. His respiration is 18 and oxygen saturation is 100%.   Physical Exam Vitals signs reviewed.  HENT:     Head: Normocephalic and atraumatic.  Eyes:     Pupils: Pupils are equal, round, and reactive to light.  Neck:     Musculoskeletal: Normal range of motion.  Cardiovascular:     Rate and Rhythm: Normal rate and regular rhythm.     Heart  sounds: Normal heart sounds.  Pulmonary:     Effort: Pulmonary effort is normal.     Breath sounds: Normal breath sounds.  Abdominal:     General: Bowel sounds are normal.     Palpations: Abdomen is soft.  Musculoskeletal: Normal range of motion.        General: No tenderness or deformity.  Lymphadenopathy:     Cervical: No cervical adenopathy.  Skin:    General: Skin is warm and dry.     Findings: No erythema or rash.  Neurological:     Mental Status: He is alert and oriented to person, place, and time.  Psychiatric:        Behavior: Behavior normal.        Thought Content: Thought content normal.        Judgment: Judgment normal.       Lab Results  Component Value Date   WBC 7.9 04/03/2019   HGB 11.4 (L) 04/03/2019   HCT 35.0 (L) 04/03/2019   MCV 89.5 04/03/2019   PLT 252 04/03/2019     Chemistry      Component Value Date/Time   NA 129 (L) 04/03/2019 0830   NA 135 12/26/2017 0847   NA 142 10/03/2017 0910   NA 137 04/03/2017 0852   K 4.1 04/03/2019 0830   K 3.9 10/03/2017 0910   K 4.2 04/03/2017 0852   CL 98 04/03/2019 0830   CL 103 10/03/2017 0910   CL 108 (H) 05/15/2013 0912   CO2 28 04/03/2019 0830   CO2 28 10/03/2017 0910   CO2 26 04/03/2017 0852   BUN 22 04/03/2019 0830   BUN 21 12/26/2017 0847   BUN 13 10/03/2017 0910   BUN 16.3 04/03/2017 0852   CREATININE 1.26 (H) 04/03/2019 0830   CREATININE 1.07 11/16/2017 1437   CREATININE 1.0 04/03/2017 0852      Component Value Date/Time   CALCIUM 9.1 04/03/2019 0830   CALCIUM 9.3 10/03/2017 0910   CALCIUM 9.0 04/03/2017 0852   ALKPHOS 64 04/03/2019 0830   ALKPHOS 51 10/03/2017 0910   ALKPHOS 46 04/03/2017 0852   AST 16 04/03/2019 0830   AST 18 04/03/2017 0852   ALT 18 04/03/2019 0830   ALT 34 10/03/2017 0910   ALT 19 04/03/2017 0852   BILITOT 0.3 04/03/2019 0830   BILITOT 0.38 04/03/2017 0852         Impression and Plan: [name] is 73 year old gentleman with IgG kappa smoldering myeloma.  So far, I do not see any evidence of progressive disease.   We will see what his myeloma numbers look like today. I would be surprised if there is much change.  Again, he is totally asymptomatic.  We will plan to get him back to see [place] in another 6 months.  Volanda Napoleon, MD 4/16/20209:33 AM

## 2019-04-04 LAB — PROTEIN ELECTROPHORESIS, SERUM
A/G Ratio: 0.8 (ref 0.7–1.7)
Albumin ELP: 3.1 g/dL (ref 2.9–4.4)
Alpha-1-Globulin: 0.2 g/dL (ref 0.0–0.4)
Alpha-2-Globulin: 0.9 g/dL (ref 0.4–1.0)
Beta Globulin: 1 g/dL (ref 0.7–1.3)
Gamma Globulin: 1.8 g/dL (ref 0.4–1.8)
Globulin, Total: 4 g/dL — ABNORMAL HIGH (ref 2.2–3.9)
M-Spike, %: 1.3 g/dL — ABNORMAL HIGH
Total Protein ELP: 7.1 g/dL (ref 6.0–8.5)

## 2019-04-04 LAB — KAPPA/LAMBDA LIGHT CHAINS
Kappa free light chain: 93.8 mg/L — ABNORMAL HIGH (ref 3.3–19.4)
Kappa, lambda light chain ratio: 5.13 — ABNORMAL HIGH (ref 0.26–1.65)
Lambda free light chains: 18.3 mg/L (ref 5.7–26.3)

## 2019-04-04 LAB — IMMUNOFIXATION ELECTROPHORESIS
IgA: 306 mg/dL (ref 61–437)
IgG (Immunoglobin G), Serum: 2143 mg/dL — ABNORMAL HIGH (ref 603–1613)
IgM (Immunoglobulin M), Srm: 25 mg/dL (ref 15–143)
Total Protein ELP: 7 g/dL (ref 6.0–8.5)

## 2019-04-04 LAB — IGG, IGA, IGM
IgA: 303 mg/dL (ref 61–437)
IgG (Immunoglobin G), Serum: 2019 mg/dL — ABNORMAL HIGH (ref 603–1613)
IgM (Immunoglobulin M), Srm: 27 mg/dL (ref 15–143)

## 2019-04-07 ENCOUNTER — Telehealth: Payer: Self-pay | Admitting: *Deleted

## 2019-04-07 ENCOUNTER — Ambulatory Visit (INDEPENDENT_AMBULATORY_CARE_PROVIDER_SITE_OTHER): Payer: Medicare Other | Admitting: *Deleted

## 2019-04-07 ENCOUNTER — Other Ambulatory Visit: Payer: Self-pay

## 2019-04-07 DIAGNOSIS — I441 Atrioventricular block, second degree: Secondary | ICD-10-CM

## 2019-04-07 LAB — CUP PACEART REMOTE DEVICE CHECK
Battery Remaining Longevity: 91 mo
Battery Remaining Percentage: 95.5 %
Battery Voltage: 3.01 V
Brady Statistic AP VP Percent: 14 %
Brady Statistic AP VS Percent: 1 %
Brady Statistic AS VP Percent: 83 %
Brady Statistic AS VS Percent: 1 %
Brady Statistic RA Percent Paced: 12 %
Date Time Interrogation Session: 20200420060026
Implantable Lead Implant Date: 20160822
Implantable Lead Implant Date: 20160822
Implantable Lead Implant Date: 20190118
Implantable Lead Location: 753858
Implantable Lead Location: 753859
Implantable Lead Location: 753860
Implantable Pulse Generator Implant Date: 20190118
Lead Channel Impedance Value: 1250 Ohm
Lead Channel Impedance Value: 440 Ohm
Lead Channel Impedance Value: 550 Ohm
Lead Channel Pacing Threshold Amplitude: 1.125 V
Lead Channel Pacing Threshold Amplitude: 1.5 V
Lead Channel Pacing Threshold Amplitude: 1.875 V
Lead Channel Pacing Threshold Pulse Width: 0.5 ms
Lead Channel Pacing Threshold Pulse Width: 0.5 ms
Lead Channel Pacing Threshold Pulse Width: 0.5 ms
Lead Channel Sensing Intrinsic Amplitude: 12 mV
Lead Channel Sensing Intrinsic Amplitude: 2 mV
Lead Channel Setting Pacing Amplitude: 2.125
Lead Channel Setting Pacing Amplitude: 2.5 V
Lead Channel Setting Pacing Amplitude: 2.875
Lead Channel Setting Pacing Pulse Width: 0.5 ms
Lead Channel Setting Pacing Pulse Width: 0.5 ms
Lead Channel Setting Sensing Sensitivity: 4 mV
Pulse Gen Model: 3262
Pulse Gen Serial Number: 8983138

## 2019-04-07 NOTE — Telephone Encounter (Signed)
-----   Message from Volanda Napoleon, MD sent at 04/04/2019  4:45 PM EDT ----- Call - the myeloma level is holding stable. The iron is low, but take OTC iron pills.  pete

## 2019-04-07 NOTE — Telephone Encounter (Signed)
As noted below by Dr. Marin Olp, I informed the patient that his myeloma level is holding stable. Iron level was a little low, so continue taking OTC iron supplement. He verbalized understanding.

## 2019-04-15 ENCOUNTER — Encounter: Payer: Self-pay | Admitting: Cardiology

## 2019-04-15 NOTE — Progress Notes (Signed)
Remote pacemaker transmission.   

## 2019-05-13 NOTE — Progress Notes (Signed)
[facility] [address] [place], [place] [address] [phone]  Date:  05/14/2019   Name:  Michael Schipani Sr.   DOB:  July 14, 1946   MRN:  [id number]  PCP:  Darreld Mclean, MD    Chief Complaint: No chief complaint on file.   History of Present Illness:  Michael Belson Sr. is a 73 y.o. very pleasant male patient who presents with the following:  Short-term follow-up today- virtual visit today  Pt location is home Provider location is office Pt ID confirmed with name and DOB, he gives consent for virtual visit today  He is being careful to social distance and is wearing a mask and gloves when he goes   I last saw this gentleman in the office in [place] was in for sick visit in March as well Michael Wiggins notes that the cough he had back in March is still there.   He was treated with azithromycin in March.  This did seem to help temporarily, but then his cough returned.  He is otherwise feeling well, just has a cough and congestion.  He does occasionally use albuterol inhaler for this CXR was negative in March He notes that the cough is not holding him from his active lifestyle  Marissa did quit smoking greater than 20 years ago.  He was a heavy smoker, and has about a 70-pack-year history We did a CT of his lungs last year to follow-up on a lung nodule   History of CHF, heart block with pacemaker, multiple myeloma, diabetes, hyperlipidemia, iron deficiency, proteinuria, increased PSA He saw urology- Dr. Junious Silk- about 2 weeks ago, PSA had trended down   He saw oncology in [place] are seen every 6 months at this point He has IgG kappa smoldering myeloma, current therapy is observation  Diabetes is treated with glipizide 5 twice daily, metformin 1000 twice daily, Lantus Lantus units- 6-7 units at bedtime He is checking his glucose am and pm- following are some daily examples   135, 73 133, 105 111, 105 105,  83 122, 85 97, 73 126,136  Lipitor 20 Carvedilol 37.5 twice daily HCTZ 25 Losartan 100 BP running 130/80s after he takes his meds and eats breakfast  He had significantly low sodium back in April, need to repeat today Can also do A1c, lipids and upcoming lab visit  Patient Active Problem List   Diagnosis Date Noted  . Chronic systolic (congestive) heart failure ([place]) 01/04/2018  . Pacemaker 12/08/2015  . Mobitz type 2 second degree heart block 08/08/2015  . Right bundle branch block 12/16/2014  . Syncope 12/16/2014  . Proteinuria due to type 2 diabetes mellitus ([place]) 11/12/2014  . Encounter for therapeutic drug monitoring 03/13/2013  . Multiple myeloma ([place]) 02/15/2013  . Edema 01/07/2013  . Diabetes mellitus due to underlying condition with other diabetic kidney complication ([place])   . Hyperlipidemia   . Hypertension   . MGUS (monoclonal gammopathy of unknown significance)   . GERD (gastroesophageal reflux disease)     Past Medical History:  Diagnosis Date  . Anemia   . Cataract   . Chronic systolic (congestive) heart failure ([place])   . Diabetes mellitus   . GERD (gastroesophageal reflux disease)   . Hyperlipidemia   . Hypertension   . MGUS (monoclonal gammopathy of unknown significance)   . Multiple myeloma ([place]) 02/2013   normal cytogenetics and FISH panel on 03/11/2013.   . Osteoarthritis   .  Presence of permanent cardiac pacemaker   . Right bundle branch block   . Syncope     Past Surgical History:  Procedure Laterality Date  . BIV UPGRADE  01/04/2018  . BIV UPGRADE N/A 01/04/2018   Procedure: BIVP UPGRADE;  Surgeon: [name], MD;  Location: [facility];  Service: Cardiovascular;  Laterality: N/A;  . EP IMPLANTABLE DEVICE N/A 08/09/2015   Procedure: Pacemaker Implant;  Surgeon: [name], MD;  Location: [facility];  Service: Cardiovascular;  Laterality: N/A;  . EYE SURGERY      Social History   Tobacco Use  . Smoking status:  Former Smoker    Packs/day: 2.00    Years: 35.00    Pack years: 70.00    Types: Cigarettes    Start date: 03/26/1963    Last attempt to quit: 12/18/1997    Years since quitting: 21.4  . Smokeless tobacco: Never Used  . Tobacco comment: quit 15 years  Substance Use Topics  . Alcohol use: No    Alcohol/week: 0.0 standard drinks  . Drug use: No    Family History  Problem Relation Age of Onset  . Hypertension Mother   . Cancer Mother   . Stroke Mother   . Hypertension Sister   . Hypertension Brother   . Hypertension Maternal Grandmother     Allergies  Allergen Reactions  . Amlodipine Swelling  . Crestor [Rosuvastatin] Other (See Comments)    Per pt unable to focus    Medication list has been reviewed and updated.  Current Outpatient Medications on File Prior to Visit  Medication Sig Dispense Refill  . albuterol (PROVENTIL HFA;VENTOLIN HFA) 108 (90 Base) MCG/ACT inhaler Inhale 2 puffs into the lungs every 6 (six) hours as needed for wheezing or shortness of breath. 1 Inhaler 2  . atorvastatin (LIPITOR) 20 MG tablet TAKE 1 TABLET BY MOUTH DAILY. PLEASE KEEP UPCOMING APPT FOR FUTURE REFILLS. THANK YOU. 90 tablet 3  . carvedilol (COREG) 25 MG tablet Take 25 mg by mouth 2 (two) times daily with a meal. Take one tablet and a half, total of 37.5 mg twice a day.    . ferrous sulfate 325 (65 FE) MG tablet Take 325 mg by mouth every other day.    . fluticasone (FLONASE) 50 MCG/ACT nasal spray Place 2 sprays into both nostrils daily as needed for allergies or rhinitis. 16 g 5  . Ginkgo Biloba (GINKOBA) 40 MG TABS Take 1 tablet by mouth every other day.    Marland Kitchen glipiZIDE (GLUCOTROL XL) 5 MG 24 hr tablet TAKE 1 TABLET BY MOUTH TWICE A DAY 180 tablet 1  . hydrochlorothiazide (HYDRODIURIL) 25 MG tablet Take 1 tablet (25 mg total) by mouth daily. 90 tablet 3  . Insulin Glargine (LANTUS SOLOSTAR) 100 UNIT/ML Solostar Pen Inject 6-10 Units into the skin daily at 10 pm. May adjust as needed 15 mL 1   . latanoprost (XALATAN) 0.005 % ophthalmic solution Place 1 drop into both eyes at bedtime.     Marland Kitchen losartan (COZAAR) 100 MG tablet Take 1 tablet (100 mg total) by mouth daily. 90 tablet 3  . metFORMIN (GLUCOPHAGE) 1000 MG tablet TAKE 1 TABLET BY MOUTH TWICE A DAY WITH MEALS 180 tablet 1  . Multiple Vitamin (MULTIVITAMIN WITH MINERALS) TABS Take 1 tablet by mouth daily.     . Omega-3 Fatty Acids (FISH OIL BURP-LESS PO) Take 1 capsule by mouth every other day.      No  current facility-administered medications on file prior to visit.     Review of Systems:  As per HPI- otherwise negative. No fever noted    Physical Examination: There were no vitals filed for this visit. There were no vitals filed for this visit. There is no height or weight on file to calculate BMI. Ideal Body Weight:    Pt observed over video today- he looks well No cough or wheezing noted No distress  Weight 218 dressed  Wt Readings from Last 3 Encounters:  04/03/19 220 lb (99.8 kg)  02/28/19 216 lb 3.2 oz (98.1 kg)  02/12/19 218 lb (98.9 kg)     Assessment and Plan: Uncontrolled type 2 diabetes mellitus without complication, with long-term current use of insulin ([place]) - Plan: Basic metabolic panel, Hemoglobin A1c, Insulin Glargine (LANTUS SOLOSTAR) 100 UNIT/ML Solostar Pen  Essential hypertension  Multiple myeloma, remission status unspecified (H[facility])  Cardiac pacemaker in situ  Mixed hyperlipidemia - Plan: Lipid panel  Bronchitis - Plan: doxycycline (VIBRAMYCIN) 100 MG capsule  Virtual follow-up visit today.  Aaryn reports well controlled blood pressure and blood sugar. We will schedule him for a lab visit ASAP to follow-up on his basic metabolic profile and J8S He has noted a cough now for several months, got temporarily better with azithromycin then his symptoms returned.  He does have albuterol to use as needed.  No hemoptysis. I will have him try a course of doxycycline, but of asked him to let me  know if this does not resolve his symptoms He could potentially have chronic bronchitis due to history of heavy smoking.  However he did quit more than 20 years ago, so onset of chronic bronchitis seems less likely  Will plan further follow- up pending labs.  Patient sent the following my chart instructions------------------ It was great to visit with you today, I will be in touch your labs ASAP I would suggest that you have the shingles vaccine, called Shingrix, at your convenience through your pharmacy.  I did give you an rx for doxycycline antibiotic for your cough- let me know if this does not resolve your symptoms however  We will set you up for a lab visit soon  Assuming your labs are ok, let's plan to visit in about 4-6 months; hopefully in person at that time!     Signed Lamar Blinks, MD

## 2019-05-13 NOTE — Patient Instructions (Addendum)
It was great to visit with you today, I will be in touch your labs ASAP I would suggest that you have the shingles vaccine, called Shingrix, at your convenience through your pharmacy.  I did give you an rx for doxycycline antibiotic for your cough- let me know if this does not resolve your symptoms however  We will set you up for a lab visit soon  Assuming your labs are ok, let's plan to visit in about 4-6 months; hopefully in person at that time!

## 2019-05-14 ENCOUNTER — Ambulatory Visit (INDEPENDENT_AMBULATORY_CARE_PROVIDER_SITE_OTHER): Payer: Medicare Other | Admitting: Family Medicine

## 2019-05-14 ENCOUNTER — Encounter: Payer: Self-pay | Admitting: Family Medicine

## 2019-05-14 ENCOUNTER — Other Ambulatory Visit: Payer: Self-pay

## 2019-05-14 DIAGNOSIS — Z950 Presence of cardiac pacemaker: Secondary | ICD-10-CM | POA: Diagnosis not present

## 2019-05-14 DIAGNOSIS — IMO0001 Reserved for inherently not codable concepts without codable children: Secondary | ICD-10-CM

## 2019-05-14 DIAGNOSIS — E1165 Type 2 diabetes mellitus with hyperglycemia: Secondary | ICD-10-CM | POA: Diagnosis not present

## 2019-05-14 DIAGNOSIS — Z794 Long term (current) use of insulin: Secondary | ICD-10-CM

## 2019-05-14 DIAGNOSIS — I10 Essential (primary) hypertension: Secondary | ICD-10-CM

## 2019-05-14 DIAGNOSIS — J40 Bronchitis, not specified as acute or chronic: Secondary | ICD-10-CM

## 2019-05-14 DIAGNOSIS — E782 Mixed hyperlipidemia: Secondary | ICD-10-CM | POA: Diagnosis not present

## 2019-05-14 DIAGNOSIS — C9000 Multiple myeloma not having achieved remission: Secondary | ICD-10-CM | POA: Diagnosis not present

## 2019-05-14 MED ORDER — DOXYCYCLINE HYCLATE 100 MG PO CAPS: 100.0000 mg | ORAL_CAPSULE | Freq: Two times a day (BID) | ORAL | 0 refills | Status: DC

## 2019-05-14 MED ORDER — INSULIN GLARGINE 100 UNIT/ML SOLOSTAR PEN: 6.0000 [IU] | PEN_INJECTOR | Freq: Every day | SUBCUTANEOUS | 1 refills | Status: DC

## 2019-05-20 ENCOUNTER — Other Ambulatory Visit (INDEPENDENT_AMBULATORY_CARE_PROVIDER_SITE_OTHER): Payer: Medicare Other

## 2019-05-20 ENCOUNTER — Encounter: Payer: Self-pay | Admitting: Family Medicine

## 2019-05-20 ENCOUNTER — Other Ambulatory Visit: Payer: Self-pay

## 2019-05-20 DIAGNOSIS — Z794 Long term (current) use of insulin: Secondary | ICD-10-CM | POA: Diagnosis not present

## 2019-05-20 DIAGNOSIS — E1165 Type 2 diabetes mellitus with hyperglycemia: Secondary | ICD-10-CM

## 2019-05-20 DIAGNOSIS — E782 Mixed hyperlipidemia: Secondary | ICD-10-CM | POA: Diagnosis not present

## 2019-05-20 DIAGNOSIS — IMO0001 Reserved for inherently not codable concepts without codable children: Secondary | ICD-10-CM

## 2019-05-20 LAB — BASIC METABOLIC PANEL
BUN: 19 mg/dL (ref 6–23)
CO2: 27 mEq/L (ref 19–32)
Calcium: 9 mg/dL (ref 8.4–10.5)
Chloride: 102 mEq/L (ref 96–112)
Creatinine, Ser: 1.05 mg/dL (ref 0.40–1.50)
GFR: 83.65 mL/min (ref >60.00)
Glucose, Bld: 120 mg/dL — ABNORMAL HIGH (ref 70–99)
Potassium: 4.5 mEq/L (ref 3.5–5.1)
Sodium: 136 mEq/L (ref 135–145)

## 2019-05-20 LAB — LIPID PANEL
Cholesterol: 116 mg/dL (ref 0–200)
HDL: 30.8 mg/dL — ABNORMAL LOW (ref >39.00)
LDL Cholesterol: 72 mg/dL (ref 0–99)
NonHDL: 84.7
Total CHOL/HDL Ratio: 4
Triglycerides: 63 mg/dL (ref 0.0–149.0)
VLDL: 12.6 mg/dL (ref 0.0–40.0)

## 2019-05-20 LAB — HEMOGLOBIN A1C: Hgb A1c MFr Bld: 7.7 % — ABNORMAL HIGH (ref 4.6–6.5)

## 2019-05-27 DIAGNOSIS — H40013 Open angle with borderline findings, low risk, bilateral: Secondary | ICD-10-CM | POA: Diagnosis not present

## 2019-05-30 ENCOUNTER — Other Ambulatory Visit: Payer: Self-pay

## 2019-05-30 NOTE — Patient Outreach (Signed)
[facility]) Care Management  05/30/2019  Michael Russey Sr. 1946-07-08 [id number]   Medication Adherence call to [name] HIPPA Compliant Voice message left with a call back number. Michael Wiggins is showing past due on Losartan 100 mg under [place].   [facility] Management Direct Dial [phone]  Fax [phone] [email or web address]

## 2019-06-03 ENCOUNTER — Other Ambulatory Visit: Payer: Self-pay | Admitting: Family Medicine

## 2019-06-03 DIAGNOSIS — I10 Essential (primary) hypertension: Secondary | ICD-10-CM

## 2019-06-09 ENCOUNTER — Other Ambulatory Visit: Payer: Self-pay | Admitting: *Deleted

## 2019-06-09 DIAGNOSIS — Z20822 Contact with and (suspected) exposure to covid-19: Secondary | ICD-10-CM

## 2019-06-15 LAB — NOVEL CORONAVIRUS, NAA: SARS-CoV-2, NAA: NOT DETECTED

## 2019-06-29 ENCOUNTER — Encounter: Payer: Self-pay | Admitting: Family Medicine

## 2019-07-07 ENCOUNTER — Ambulatory Visit (INDEPENDENT_AMBULATORY_CARE_PROVIDER_SITE_OTHER): Payer: Medicare Other | Admitting: *Deleted

## 2019-07-07 DIAGNOSIS — I441 Atrioventricular block, second degree: Secondary | ICD-10-CM

## 2019-07-07 LAB — CUP PACEART REMOTE DEVICE CHECK
Date Time Interrogation Session: 20200720162927
Implantable Lead Implant Date: 20160822
Implantable Lead Implant Date: 20160822
Implantable Lead Implant Date: 20190118
Implantable Lead Location: 753858
Implantable Lead Location: 753859
Implantable Lead Location: 753860
Implantable Pulse Generator Implant Date: 20190118
Pulse Gen Model: 3262
Pulse Gen Serial Number: 8983138

## 2019-07-10 ENCOUNTER — Other Ambulatory Visit: Payer: Self-pay | Admitting: Family Medicine

## 2019-07-10 MED ORDER — GLIPIZIDE ER 5 MG PO TB24: 5.0000 mg | ORAL_TABLET | Freq: Two times a day (BID) | ORAL | 1 refills | Status: DC

## 2019-07-10 MED ORDER — METFORMIN HCL 1000 MG PO TABS: 1000.0000 mg | ORAL_TABLET | Freq: Two times a day (BID) | ORAL | 1 refills | Status: DC

## 2019-07-10 NOTE — Telephone Encounter (Signed)
Medication Refill - Medication: glipiZIDE (GLUCOTROL XL) 5 MG 24 hr tablet and metFORMIN (GLUCOPHAGE) 1000 MG tablet    Has the patient contacted their pharmacy? Yes.  Pt called stating pharmacy had tried to get in contact with office. Pt states he only has enough medication to last him through the weekend. Please advise.  (Agent: If no, request that the patient contact the pharmacy for the refill.) (Agent: If yes, when and what did the pharmacy advise?)  Preferred Pharmacy (with phone number or street name):  [id number] - [place], [place] [address]  Phone: [phone] Fax: [phone]  Not a 24 hour pharmacy; exact hours not known.     Agent: Please be advised that RX refills may take up to 3 business days. We ask that you follow-up with your pharmacy.

## 2019-07-10 NOTE — Telephone Encounter (Signed)
Medication Refills have been sent to pharmacy.

## 2019-07-17 ENCOUNTER — Telehealth: Payer: Self-pay

## 2019-07-17 NOTE — Telephone Encounter (Signed)
Called pt for COVID screening..no answer.

## 2019-07-18 ENCOUNTER — Encounter: Payer: Medicare Other | Admitting: Internal Medicine

## 2019-07-21 ENCOUNTER — Encounter: Payer: Self-pay | Admitting: Cardiology

## 2019-07-21 NOTE — Progress Notes (Signed)
Remote pacemaker transmission.   

## 2019-08-30 ENCOUNTER — Encounter: Payer: Self-pay | Admitting: Family Medicine

## 2019-09-02 MED ORDER — INSULIN PEN NEEDLE 31G X 5 MM MISC: 5 refills | Status: DC

## 2019-09-03 ENCOUNTER — Encounter: Payer: Self-pay | Admitting: Internal Medicine

## 2019-09-03 ENCOUNTER — Ambulatory Visit (INDEPENDENT_AMBULATORY_CARE_PROVIDER_SITE_OTHER): Payer: Medicare Other | Admitting: Internal Medicine

## 2019-09-03 ENCOUNTER — Other Ambulatory Visit: Payer: Self-pay

## 2019-09-03 VITALS — BP 148/82 | HR 56 | Ht 75.0 in | Wt 218.0 lb

## 2019-09-03 DIAGNOSIS — Z950 Presence of cardiac pacemaker: Secondary | ICD-10-CM | POA: Diagnosis not present

## 2019-09-03 DIAGNOSIS — I441 Atrioventricular block, second degree: Secondary | ICD-10-CM

## 2019-09-03 DIAGNOSIS — I10 Essential (primary) hypertension: Secondary | ICD-10-CM | POA: Diagnosis not present

## 2019-09-03 DIAGNOSIS — I5022 Chronic systolic (congestive) heart failure: Secondary | ICD-10-CM | POA: Diagnosis not present

## 2019-09-03 DIAGNOSIS — E785 Hyperlipidemia, unspecified: Secondary | ICD-10-CM | POA: Diagnosis not present

## 2019-09-03 NOTE — Patient Instructions (Signed)
Medication Instructions:  Your physician recommends that you continue on your current medications as directed. Please refer to the Current Medication list given to you today.  Labwork: None ordered.  Testing/Procedures: None ordered.  Follow-Up: Your physician wants you to follow-up in: one year with Dr. Lovena Le.   You will receive a reminder letter in the mail two months in advance. If you don't receive a letter, please call our office to schedule the follow-up appointment.  Remote monitoring is used to monitor your Pacemaker from home. This monitoring reduces the number of office visits required to check your device to one time per year. It allows [place] to keep an eye on the functioning of your device to ensure it is working properly. You are scheduled for a device check from home on 10/06/2019. You may send your transmission at any time that day. If you have a wireless device, the transmission will be sent automatically. After your physician reviews your transmission, you will receive a postcard with your next transmission date.  Any Other Special Instructions Will Be Listed Below (If Applicable).  If you need a refill on your cardiac medications before your next appointment, please call your pharmacy.

## 2019-09-03 NOTE — Progress Notes (Signed)
HPI Michael Wiggins returns today for ongoing evaluation of his PPM after undergoing Biv upgrade just over 12 months ago. In the interim, hehas done well. He is back to working regularly. He works in Engineer, production. No syncope.He admits to dietary indiscretion with sodium. He notes that his bp has been high. He feels well. He is exercising regularly.  Allergies  Allergen Reactions  . Amlodipine Swelling  . Crestor [Rosuvastatin] Other (See Comments)    Per pt unable to focus     Current Outpatient Medications  Medication Sig Dispense Refill  . albuterol (PROVENTIL HFA;VENTOLIN HFA) 108 (90 Base) MCG/ACT inhaler Inhale 2 puffs into the lungs every 6 (six) hours as needed for wheezing or shortness of breath. 1 Inhaler 2  . atorvastatin (LIPITOR) 20 MG tablet TAKE 1 TABLET BY MOUTH DAILY. PLEASE KEEP UPCOMING APPT FOR FUTURE REFILLS. THANK YOU. 90 tablet 3  . carvedilol (COREG) 25 MG tablet Take 25 mg by mouth 2 (two) times daily with a meal. Take one tablet and a half, total of 37.5 mg twice a day.    Marland Kitchen doxycycline (VIBRAMYCIN) 100 MG capsule Take 1 capsule (100 mg total) by mouth 2 (two) times daily. 20 capsule 0  . ferrous sulfate 325 (65 FE) MG tablet Take 325 mg by mouth every other day.    . fluticasone (FLONASE) 50 MCG/ACT nasal spray Place 2 sprays into both nostrils daily as needed for allergies or rhinitis. 16 g 5  . Ginkgo Biloba (GINKOBA) 40 MG TABS Take 1 tablet by mouth every other day.    Marland Kitchen glipiZIDE (GLUCOTROL XL) 5 MG 24 hr tablet Take 1 tablet (5 mg total) by mouth 2 (two) times daily. 180 tablet 1  . Insulin Glargine (LANTUS SOLOSTAR) 100 UNIT/ML Solostar Pen Inject 6-10 Units into the skin daily at 10 pm. May adjust as needed 15 mL 1  . Insulin Pen Needle 31G X 5 MM MISC USE AS DIRECTED EVERY DAY. 100 each 5  . latanoprost (XALATAN) 0.005 % ophthalmic solution Place 1 drop into both eyes at bedtime.     Marland Kitchen losartan-hydrochlorothiazide (HYZAAR) 100-25 MG  tablet Take 1 tablet by mouth daily.    . metFORMIN (GLUCOPHAGE) 1000 MG tablet Take 1 tablet (1,000 mg total) by mouth 2 (two) times daily with a meal. 180 tablet 1  . Multiple Vitamin (MULTIVITAMIN WITH MINERALS) TABS Take 1 tablet by mouth daily.     . Omega-3 Fatty Acids (FISH OIL BURP-LESS PO) Take 1 capsule by mouth every other day.     . valsartan (DIOVAN) 320 MG tablet Please specify directions, refills and quantity 90 tablet 1   No current facility-administered medications for this visit.      Past Medical History:  Diagnosis Date  . Anemia   . Cataract   . Chronic systolic (congestive) heart failure ([place])   . Diabetes mellitus   . GERD (gastroesophageal reflux disease)   . Hyperlipidemia   . Hypertension   . MGUS (monoclonal gammopathy of unknown significance)   . Multiple myeloma ([place]) 02/2013   normal cytogenetics and FISH panel on 03/11/2013.   . Osteoarthritis   . Presence of permanent cardiac pacemaker   . Right bundle branch block   . Syncope     ROS:   All systems reviewed and negative except as noted in the HPI.   Past Surgical History:  Procedure Laterality Date  . BIV UPGRADE  01/04/2018  . BIV UPGRADE  N/A 01/04/2018   Procedure: Cyndie Mull;  Surgeon: [name], MD;  Location: [facility];  Service: Cardiovascular;  Laterality: N/A;  . EP IMPLANTABLE DEVICE N/A 08/09/2015   Procedure: Pacemaker Implant;  Surgeon: [name], MD;  Location: [facility];  Service: Cardiovascular;  Laterality: N/A;  . EYE SURGERY       Family History  Problem Relation Age of Onset  . Hypertension Mother   . Cancer Mother   . Stroke Mother   . Hypertension Sister   . Hypertension Brother   . Hypertension Maternal Grandmother      Social History   Socioeconomic History  . Marital status: Married    Spouse name: Not on file  . Number of children: 3  . Years of education: Not on file  . Highest education level: Not on file  Occupational  History  . Not on file  Social Needs  . Financial resource strain: Not on file  . Food insecurity    Worry: Not on file    Inability: Not on file  . Transportation needs    Medical: Not on file    Non-medical: Not on file  Tobacco Use  . Smoking status: Former Smoker    Packs/day: 2.00    Years: 35.00    Pack years: 70.00    Types: Cigarettes    Start date: 03/26/1963    Quit date: 12/18/1997    Years since quitting: 21.7  . Smokeless tobacco: Never Used  . Tobacco comment: quit 15 years  Substance and Sexual Activity  . Alcohol use: No    Alcohol/week: 0.0 standard drinks  . Drug use: No  . Sexual activity: Yes    Birth control/protection: None  Lifestyle  . Physical activity    Days per week: Not on file    Minutes per session: Not on file  . Stress: Not on file  Relationships  . Social Herbalist on phone: Not on file    Gets together: Not on file    Attends religious service: Not on file    Active member of club or organization: Not on file    Attends meetings of clubs or organizations: Not on file    Relationship status: Not on file  . Intimate partner violence    Fear of current or ex partner: Not on file    Emotionally abused: Not on file    Physically abused: Not on file    Forced sexual activity: Not on file  Other Topics Concern  . Not on file  Social History Narrative   Married   Building Maintenance     BP (!) 148/82   Pulse (!) 56   Ht '6\' 3"'$  (1.905 m)   Wt 218 lb (98.9 kg)   SpO2 96%   BMI 27.25 kg/m   Physical Exam:  Well appearing NAD HEENT: Unremarkable Neck:  No JVD, no thyromegally Lymphatics:  No adenopathy Back:  No CVA tenderness Lungs:  Clear with no wheezes HEART:  Regular rate rhythm, no murmurs, no rubs, no clicks Abd:  soft, positive bowel sounds, no organomegally, no rebound, no guarding Ext:  2 plus pulses, no edema, no cyanosis, no clubbing Skin:  No rashes no nodules Neuro:  CN II through XII intact, motor  grossly intac  DEVICE  Normal device function.  See PaceArt for details.   Assess/Plan: 1. CHB - he is asymptomatic, s/p BiV PPM. 2. Chronic systolic heart  failure - his symptoms remain class 2. He has gone back to working out with minimal limitation except that he is sore. 3. HTN - his SBP is up today. He states that it is better at home.  4. Biv PPM - his [employer] Biv PPM is working normally. We will recheck in several months. He has some atrial lead noise and his sensitivity was reduce from 0.5 to 0.75 today.  Mikle Bosworth.D.

## 2019-09-04 ENCOUNTER — Encounter: Payer: Self-pay | Admitting: Family Medicine

## 2019-09-05 ENCOUNTER — Encounter: Payer: Self-pay | Admitting: Family Medicine

## 2019-09-08 DIAGNOSIS — N181 Chronic kidney disease, stage 1: Secondary | ICD-10-CM | POA: Diagnosis not present

## 2019-09-08 DIAGNOSIS — E785 Hyperlipidemia, unspecified: Secondary | ICD-10-CM | POA: Diagnosis not present

## 2019-09-08 DIAGNOSIS — E1122 Type 2 diabetes mellitus with diabetic chronic kidney disease: Secondary | ICD-10-CM | POA: Diagnosis not present

## 2019-09-08 DIAGNOSIS — I129 Hypertensive chronic kidney disease with stage 1 through stage 4 chronic kidney disease, or unspecified chronic kidney disease: Secondary | ICD-10-CM | POA: Diagnosis not present

## 2019-09-08 DIAGNOSIS — R809 Proteinuria, unspecified: Secondary | ICD-10-CM | POA: Diagnosis not present

## 2019-09-08 DIAGNOSIS — E1129 Type 2 diabetes mellitus with other diabetic kidney complication: Secondary | ICD-10-CM | POA: Diagnosis not present

## 2019-09-12 ENCOUNTER — Other Ambulatory Visit: Payer: Self-pay

## 2019-09-12 NOTE — Patient Outreach (Signed)
[facility]) Care Management  09/12/2019  Michael Idler Sr. 1946/03/11 [id number]   Medication Adherence call to [name] HIPPA Compliant Voice message left with a call back number. [name] is showing past due on Valsartan 320 mg under [place].   [facility] Management Direct Dial [phone]  Fax [phone] [email or web address]

## 2019-09-19 ENCOUNTER — Ambulatory Visit: Payer: Medicare Other

## 2019-09-24 ENCOUNTER — Encounter: Payer: Self-pay | Admitting: Family Medicine

## 2019-09-25 ENCOUNTER — Other Ambulatory Visit: Payer: Self-pay | Admitting: Internal Medicine

## 2019-09-25 MED ORDER — LOSARTAN POTASSIUM-HCTZ 100-25 MG PO TABS: 1.0000 | ORAL_TABLET | Freq: Every day | ORAL | 3 refills | Status: DC

## 2019-09-26 ENCOUNTER — Other Ambulatory Visit: Payer: Self-pay

## 2019-09-26 DIAGNOSIS — H348112 Central retinal vein occlusion, right eye, stable: Secondary | ICD-10-CM | POA: Diagnosis not present

## 2019-09-26 NOTE — Patient Outreach (Signed)
[facility]) Care Management  09/26/2019  Jurem Wethington Sr. 1946-07-11 [id number]   Medication Adherence call to Mr. Arvill Libengood HIPPA Compliant Voice message left with a call back number. Mr. Rosanna Randy is showing past due on Valsartan 320 mg under [place].   [facility] Management Direct Dial [phone]  Fax [phone] [email or web address]

## 2019-10-02 ENCOUNTER — Inpatient Hospital Stay: Payer: Medicare Other

## 2019-10-02 ENCOUNTER — Inpatient Hospital Stay: Payer: Medicare Other | Attending: Hematology & Oncology | Admitting: Hematology & Oncology

## 2019-10-02 ENCOUNTER — Encounter: Payer: Self-pay | Admitting: Hematology & Oncology

## 2019-10-02 ENCOUNTER — Other Ambulatory Visit: Payer: Self-pay

## 2019-10-02 VITALS — BP 173/96 | HR 60 | Temp 97.8°F | Resp 18 | Wt 214.0 lb

## 2019-10-02 DIAGNOSIS — D509 Iron deficiency anemia, unspecified: Secondary | ICD-10-CM | POA: Diagnosis not present

## 2019-10-02 DIAGNOSIS — Z7982 Long term (current) use of aspirin: Secondary | ICD-10-CM | POA: Diagnosis not present

## 2019-10-02 DIAGNOSIS — C9000 Multiple myeloma not having achieved remission: Secondary | ICD-10-CM | POA: Diagnosis not present

## 2019-10-02 DIAGNOSIS — Z79899 Other long term (current) drug therapy: Secondary | ICD-10-CM | POA: Diagnosis not present

## 2019-10-02 DIAGNOSIS — D500 Iron deficiency anemia secondary to blood loss (chronic): Secondary | ICD-10-CM | POA: Insufficient documentation

## 2019-10-02 DIAGNOSIS — E119 Type 2 diabetes mellitus without complications: Secondary | ICD-10-CM | POA: Insufficient documentation

## 2019-10-02 DIAGNOSIS — E785 Hyperlipidemia, unspecified: Secondary | ICD-10-CM | POA: Diagnosis not present

## 2019-10-02 DIAGNOSIS — Z7984 Long term (current) use of oral hypoglycemic drugs: Secondary | ICD-10-CM | POA: Diagnosis not present

## 2019-10-02 HISTORY — DX: Iron deficiency anemia secondary to blood loss (chronic): D50.0

## 2019-10-02 LAB — CBC WITH DIFFERENTIAL (CANCER CENTER ONLY)
Abs Immature Granulocytes: 0.02 10*3/uL (ref 0.00–0.07)
Basophils Absolute: 0.1 10*3/uL (ref 0.0–0.1)
Basophils Relative: 1 %
Eosinophils Absolute: 0.6 10*3/uL — ABNORMAL HIGH (ref 0.0–0.5)
Eosinophils Relative: 8 %
HCT: 35.7 % — ABNORMAL LOW (ref 39.0–52.0)
Hemoglobin: 11.7 g/dL — ABNORMAL LOW (ref 13.0–17.0)
Immature Granulocytes: 0 %
Lymphocytes Relative: 26 %
Lymphs Abs: 1.8 10*3/uL (ref 0.7–4.0)
MCH: 29 pg (ref 26.0–34.0)
MCHC: 32.8 g/dL (ref 30.0–36.0)
MCV: 88.4 fL (ref 80.0–100.0)
Monocytes Absolute: 0.8 10*3/uL (ref 0.1–1.0)
Monocytes Relative: 11 %
Neutro Abs: 3.9 10*3/uL (ref 1.7–7.7)
Neutrophils Relative %: 54 %
Platelet Count: 246 10*3/uL (ref 150–400)
RBC: 4.04 MIL/uL — ABNORMAL LOW (ref 4.22–5.81)
RDW: 14 % (ref 11.5–15.5)
WBC Count: 7.2 10*3/uL (ref 4.0–10.5)
nRBC: 0 % (ref 0.0–0.2)

## 2019-10-02 LAB — CMP (CANCER CENTER ONLY)
ALT: 19 U/L (ref 0–44)
AST: 18 U/L (ref 15–41)
Albumin: 4 g/dL (ref 3.5–5.0)
Alkaline Phosphatase: 48 U/L (ref 38–126)
Anion gap: 6 (ref 5–15)
BUN: 28 mg/dL — ABNORMAL HIGH (ref 8–23)
CO2: 28 mmol/L (ref 22–32)
Calcium: 9.9 mg/dL (ref 8.9–10.3)
Chloride: 94 mmol/L — ABNORMAL LOW (ref 98–111)
Creatinine: 1.32 mg/dL — ABNORMAL HIGH (ref 0.61–1.24)
GFR, Est AFR Am: >60 mL/min (ref >60)
GFR, Estimated: 53 mL/min — ABNORMAL LOW (ref >60)
Glucose, Bld: 123 mg/dL — ABNORMAL HIGH (ref 70–99)
Potassium: 4.5 mmol/L (ref 3.5–5.1)
Sodium: 128 mmol/L — ABNORMAL LOW (ref 135–145)
Total Bilirubin: 0.4 mg/dL (ref 0.3–1.2)
Total Protein: 7.7 g/dL (ref 6.5–8.1)

## 2019-10-02 LAB — IRON AND TIBC
Iron: 45 ug/dL (ref 42–163)
Saturation Ratios: 13 % — ABNORMAL LOW (ref 20–55)
TIBC: 348 ug/dL (ref 202–409)
UIBC: 302 ug/dL (ref 117–376)

## 2019-10-02 LAB — FERRITIN: Ferritin: 71 ng/mL (ref 24–336)

## 2019-10-02 NOTE — Progress Notes (Signed)
Hematology and Oncology Follow Up Visit  Michael Matsui Sr. [id number] May 25, 1946 73 y.o. 10/02/2019   Principle Diagnosis:   IgG kappa smoldering myeloma  Iron deficiency anemia  Current Therapy:    Observation     Interim History:  [name] is back for followup.  So far, he is doing quite well.  We see him every 6 months.  His blood sugars have been under very good control.  To me, this is always been his biggest issue.  As far as the smoldering myeloma goes when he was last seen in April 2020,  his M spike was 1.3 g/dL.  His IgG level was 2140 mg/dL.  His kappa light chain was 9.4 mg/dL.  He is still working.  He still working full-time.  The coronavirus has not affected his work all that much.  He has had no cough.  He has had no rashes.  He had to have a new pacemaker placed.  This was a couple months ago.  It seems to be working quite well.  Overall, his performance status is ECOG 0.    Medications:  Current Outpatient Medications:  .  ASPIRIN 81 PO, Take 81 mg by mouth daily., Disp: , Rfl:  .  albuterol (PROVENTIL HFA;VENTOLIN HFA) 108 (90 Base) MCG/ACT inhaler, Inhale 2 puffs into the lungs every 6 (six) hours as needed for wheezing or shortness of breath., Disp: 1 Inhaler, Rfl: 2 .  atorvastatin (LIPITOR) 20 MG tablet, TAKE 1 TABLET BY MOUTH DAILY. PLEASE KEEP UPCOMING APPT FOR FUTURE REFILLS. THANK YOU., Disp: 90 tablet, Rfl: 3 .  carvedilol (COREG) 25 MG tablet, Take 25 mg by mouth 2 (two) times daily with a meal. Take one tablet and a half, total of 37.5 mg twice a day., Disp: , Rfl:  .  ferrous sulfate 325 (65 FE) MG tablet, Take 325 mg by mouth every other day., Disp: , Rfl:  .  fluticasone (FLONASE) 50 MCG/ACT nasal spray, Place 2 sprays into both nostrils daily as needed for allergies or rhinitis., Disp: 16 g, Rfl: 5 .  Ginkgo Biloba (GINKOBA) 40 MG TABS, Take 1 tablet by mouth every other day., Disp: , Rfl:  .  glipiZIDE (GLUCOTROL XL) 5 MG 24 hr tablet, Take  1 tablet (5 mg total) by mouth 2 (two) times daily., Disp: 180 tablet, Rfl: 1 .  latanoprost (XALATAN) 0.005 % ophthalmic solution, Place 1 drop into both eyes at bedtime. , Disp: , Rfl:  .  losartan-hydrochlorothiazide (HYZAAR) 100-25 MG tablet, Take 1 tablet by mouth daily., Disp: 90 tablet, Rfl: 3 .  metFORMIN (GLUCOPHAGE) 1000 MG tablet, Take 1 tablet (1,000 mg total) by mouth 2 (two) times daily with a meal., Disp: 180 tablet, Rfl: 1 .  Multiple Vitamin (MULTIVITAMIN WITH MINERALS) TABS, Take 1 tablet by mouth daily. , Disp: , Rfl:  .  Omega-3 Fatty Acids (FISH OIL BURP-LESS PO), Take 1 capsule by mouth every other day. , Disp: , Rfl:   Allergies:  Allergies  Allergen Reactions  . Amlodipine Swelling  . Crestor [Rosuvastatin] Other (See Comments)    Per pt unable to focus    Past Medical History, Surgical history, Social history, and Family History were reviewed and updated.  Review of Systems: Review of Systems  Constitutional: Negative.   HENT: Negative.   Eyes: Negative.   Respiratory: Negative.   Cardiovascular: Negative.   Gastrointestinal: Negative.   Genitourinary: Negative.   Musculoskeletal: Negative.   Skin: Negative.   Neurological: Negative.  Endo/Heme/Allergies: Negative.   Psychiatric/Behavioral: Negative.      Physical Exam:  vitals were not taken for this visit.   Physical Exam Vitals signs reviewed.  HENT:     Head: Normocephalic and atraumatic.  Eyes:     Pupils: Pupils are equal, round, and reactive to light.  Neck:     Musculoskeletal: Normal range of motion.  Cardiovascular:     Rate and Rhythm: Normal rate and regular rhythm.     Heart sounds: Normal heart sounds.  Pulmonary:     Effort: Pulmonary effort is normal.     Breath sounds: Normal breath sounds.  Abdominal:     General: Bowel sounds are normal.     Palpations: Abdomen is soft.  Musculoskeletal: Normal range of motion.        General: No tenderness or deformity.   Lymphadenopathy:     Cervical: No cervical adenopathy.  Skin:    General: Skin is warm and dry.     Findings: No erythema or rash.  Neurological:     Mental Status: He is alert and oriented to person, place, and time.  Psychiatric:        Behavior: Behavior normal.        Thought Content: Thought content normal.        Judgment: Judgment normal.       Lab Results  Component Value Date   WBC 7.2 10/02/2019   HGB 11.7 (L) 10/02/2019   HCT 35.7 (L) 10/02/2019   MCV 88.4 10/02/2019   PLT 246 10/02/2019     Chemistry      Component Value Date/Time   NA 128 (L) 10/02/2019 0833   NA 135 12/26/2017 0847   NA 142 10/03/2017 0910   NA 137 04/03/2017 0852   K 4.5 10/02/2019 0833   K 3.9 10/03/2017 0910   K 4.2 04/03/2017 0852   CL 94 (L) 10/02/2019 0833   CL 103 10/03/2017 0910   CL 108 (H) 05/15/2013 0912   CO2 28 10/02/2019 0833   CO2 28 10/03/2017 0910   CO2 26 04/03/2017 0852   BUN 28 (H) 10/02/2019 0833   BUN 21 12/26/2017 0847   BUN 13 10/03/2017 0910   BUN 16.3 04/03/2017 0852   CREATININE 1.32 (H) 10/02/2019 0833   CREATININE 1.07 11/16/2017 1437   CREATININE 1.0 04/03/2017 0852      Component Value Date/Time   CALCIUM 9.9 10/02/2019 0833   CALCIUM 9.3 10/03/2017 0910   CALCIUM 9.0 04/03/2017 0852   ALKPHOS 48 10/02/2019 0833   ALKPHOS 51 10/03/2017 0910   ALKPHOS 46 04/03/2017 0852   AST 18 10/02/2019 0833   AST 18 04/03/2017 0852   ALT 19 10/02/2019 0833   ALT 34 10/03/2017 0910   ALT 19 04/03/2017 0852   BILITOT 0.4 10/02/2019 0833   BILITOT 0.38 04/03/2017 0852         Impression and Plan: Michael Wiggins is 73 year old gentleman with IgG kappa smoldering myeloma. So far, I do not see any evidence of progressive disease.   We will see what his myeloma numbers look like today. I would be surprised if there is much change.  Again, he is totally asymptomatic.  We will plan to get him back to see [place] in another 6 months.  Volanda Napoleon,  MD 10/15/20209:28 AM

## 2019-10-03 ENCOUNTER — Telehealth: Payer: Self-pay | Admitting: Internal Medicine

## 2019-10-03 LAB — KAPPA/LAMBDA LIGHT CHAINS
Kappa free light chain: 92.2 mg/L — ABNORMAL HIGH (ref 3.3–19.4)
Kappa, lambda light chain ratio: 4.13 — ABNORMAL HIGH (ref 0.26–1.65)
Lambda free light chains: 22.3 mg/L (ref 5.7–26.3)

## 2019-10-03 LAB — IGG, IGA, IGM
IgA: 276 mg/dL (ref 61–437)
IgG (Immunoglobin G), Serum: 2228 mg/dL — ABNORMAL HIGH (ref 603–1613)
IgM (Immunoglobulin M), Srm: 22 mg/dL (ref 15–143)

## 2019-10-03 NOTE — Telephone Encounter (Signed)
Returned call to Pt.  Pt had recent oncology appt, oncologist concerned with Cr value.  Pt also having increased nighttime urination.  Of note, Pt recently stopped taking insulin as he is losing weight and coming off some diabetic medication.  Pt would like to try cutting Hyzaar in half to see if this helps improve kidney function and reduce nighttime urination.  Advised ok to trial.  Pt will take 1/2 dose for 2 weeks and monitor blood pressure.  After 2 weeks he will submit blood pressures for review.  Will review and discuss with GT.  Will need to recheck Cr.

## 2019-10-03 NOTE — Telephone Encounter (Signed)
New message:    Patient calling stating that he has some questions about going to the bathroom a lot and would like can take 1/2. Please call patient.

## 2019-10-06 ENCOUNTER — Ambulatory Visit (INDEPENDENT_AMBULATORY_CARE_PROVIDER_SITE_OTHER): Payer: Medicare Other | Admitting: *Deleted

## 2019-10-06 ENCOUNTER — Other Ambulatory Visit: Payer: Self-pay

## 2019-10-06 ENCOUNTER — Encounter: Payer: Self-pay | Admitting: Family Medicine

## 2019-10-06 ENCOUNTER — Ambulatory Visit (INDEPENDENT_AMBULATORY_CARE_PROVIDER_SITE_OTHER): Payer: Medicare Other | Admitting: Family Medicine

## 2019-10-06 DIAGNOSIS — R220 Localized swelling, mass and lump, head: Secondary | ICD-10-CM

## 2019-10-06 DIAGNOSIS — I441 Atrioventricular block, second degree: Secondary | ICD-10-CM

## 2019-10-06 DIAGNOSIS — I5022 Chronic systolic (congestive) heart failure: Secondary | ICD-10-CM | POA: Diagnosis not present

## 2019-10-06 LAB — CUP PACEART REMOTE DEVICE CHECK
Battery Remaining Longevity: 92 mo
Battery Remaining Percentage: 95.5 %
Battery Voltage: 2.99 V
Brady Statistic AP VP Percent: 24 %
Brady Statistic AP VS Percent: 1 %
Brady Statistic AS VP Percent: 76 %
Brady Statistic AS VS Percent: 1 %
Brady Statistic RA Percent Paced: 23 %
Date Time Interrogation Session: 20201019094043
Implantable Lead Implant Date: 20160822
Implantable Lead Implant Date: 20160822
Implantable Lead Implant Date: 20190118
Implantable Lead Location: 753858
Implantable Lead Location: 753859
Implantable Lead Location: 753860
Implantable Pulse Generator Implant Date: 20190118
Lead Channel Impedance Value: 1225 Ohm
Lead Channel Impedance Value: 400 Ohm
Lead Channel Impedance Value: 550 Ohm
Lead Channel Pacing Threshold Amplitude: 0.875 V
Lead Channel Pacing Threshold Amplitude: 1.125 V
Lead Channel Pacing Threshold Amplitude: 1.625 V
Lead Channel Pacing Threshold Pulse Width: 0.5 ms
Lead Channel Pacing Threshold Pulse Width: 0.5 ms
Lead Channel Pacing Threshold Pulse Width: 0.5 ms
Lead Channel Sensing Intrinsic Amplitude: 12 mV
Lead Channel Sensing Intrinsic Amplitude: 2.2 mV
Lead Channel Setting Pacing Amplitude: 1.875
Lead Channel Setting Pacing Amplitude: 2.125
Lead Channel Setting Pacing Amplitude: 2.625
Lead Channel Setting Pacing Pulse Width: 0.5 ms
Lead Channel Setting Pacing Pulse Width: 0.5 ms
Lead Channel Setting Sensing Sensitivity: 4 mV
Pulse Gen Model: 3262
Pulse Gen Serial Number: 8983138

## 2019-10-06 MED ORDER — AMOXICILLIN-POT CLAVULANATE 875-125 MG PO TABS: 1.0000 | ORAL_TABLET | Freq: Two times a day (BID) | ORAL | 0 refills | Status: DC

## 2019-10-06 NOTE — Progress Notes (Signed)
[place] at [facility] [address] [place], [place] [address] [phone]  Date:  10/06/2019   Name:  Michael Calderwood Sr.   DOB:  1946/06/22   MRN:  [id number]  PCP:  Darreld Mclean, MD    Chief Complaint: No chief complaint on file.   History of Present Illness:  Joshu Furukawa Sr. is a 73 y.o. very pleasant male patient who presents with the following:  Virtual visit today due to pandemic Patient location is home, provider is at office Patient identity confirmed with 2 factors, he gives consent for virtual visit today We last met for virtual visit in May of this year  [place] has history of CHF, heart block with pacemaker, multiple myeloma, diabetes, hypertension, hyperlipidemia, iron deficiency, proteinuria, increased PSA  Multiple myeloma is smoldering IgG kappa-he saw Dr. Marin Olp just last week.  He is doing fine, no sign of progression Diabetes has been under good control for age Metformin 1000 BID Lab Results  Component Value Date   HGBA1C 7.7 (H) 05/20/2019   He saw Dr. Lovena Le in September He had his pacemaker upgraded not long ago  Today he has concern of jaw/ tooth pain  His DDS is Dr Quincy Simmonds- he notes some pain in his jaw maybe 7 month ago, was concerned about a cavity/ abscessed tooth  He saw his dentist, was treated with amox and did well, sx resolved in about 4 days The same thing occurred 4 months later- his DDS took x-rays and they did not think it was a tooth issue after all They wanted him to get another x-ray but it was too expensive-he did not end up getting the study done.  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  They had to fire one of his employees so he is doing more physical work than is normal for him  He is no longer taking insulin and his glucose is under good control- may even be borderline low at times Recent sodium was low  They recently halved his BP meds on the advice of Dr Lovena Le -he is getting up at least a bit less frequently to urinate.  Reduced nocturia from 4x to 3x   He is currently taking losartan 50/HCTZ 12.5 daily  No CP  He may get mild SOB when he mows grass- however this seems to resolve with continued exercise "and then I can mow for 5 hours"  No difficulty with swallowing or breathing   Patient Active Problem List   Diagnosis Date Noted  . Iron deficiency anemia due to chronic blood loss 10/02/2019  . Chronic systolic (congestive) heart failure ([place]) 01/04/2018  . Pacemaker 12/08/2015  . Mobitz type 2 second degree heart block 08/08/2015  . Right bundle branch block 12/16/2014  . Syncope 12/16/2014  . Proteinuria due to type 2 diabetes mellitus ([place]) 11/12/2014  . Encounter for therapeutic drug monitoring 03/13/2013  . Multiple myeloma ([place]) 02/15/2013  . Edema 01/07/2013  . Diabetes mellitus due to underlying condition with other diabetic kidney complication ([place])   .  Hyperlipidemia   . Hypertension   . MGUS (monoclonal gammopathy of unknown significance)   . GERD (gastroesophageal reflux disease)     Past Medical History:  Diagnosis Date  . Anemia   . Cataract   . Chronic systolic (congestive) heart failure ([place])   . Diabetes mellitus   . GERD (gastroesophageal reflux disease)   . Hyperlipidemia   . Hypertension   . Iron deficiency anemia due to chronic blood loss 10/02/2019  . MGUS (monoclonal gammopathy of unknown significance)   . Multiple myeloma ([place]) 02/2013   normal cytogenetics and FISH panel on 03/11/2013.   . Osteoarthritis   . Presence of permanent cardiac pacemaker   . Right bundle branch block    . Syncope     Past Surgical History:  Procedure Laterality Date  . BIV UPGRADE  01/04/2018  . BIV UPGRADE N/A 01/04/2018   Procedure: BIVP UPGRADE;  Surgeon: [name], MD;  Location: [facility];  Service: Cardiovascular;  Laterality: N/A;  . EP IMPLANTABLE DEVICE N/A 08/09/2015   Procedure: Pacemaker Implant;  Surgeon: [name], MD;  Location: [facility];  Service: Cardiovascular;  Laterality: N/A;  . EYE SURGERY      Social History   Tobacco Use  . Smoking status: Former Smoker    Packs/day: 2.00    Years: 35.00    Pack years: 70.00    Types: Cigarettes    Start date: 03/26/1963    Quit date: 12/18/1997    Years since quitting: 21.8  . Smokeless tobacco: Never Used  . Tobacco comment: quit 15 years  Substance Use Topics  . Alcohol use: No    Alcohol/week: 0.0 standard drinks  . Drug use: No    Family History  Problem Relation Age of Onset  . Hypertension Mother   . Cancer Mother   . Stroke Mother   . Hypertension Sister   . Hypertension Brother   . Hypertension Maternal Grandmother     Allergies  Allergen Reactions  . Amlodipine Swelling  . Crestor [Rosuvastatin] Other (See Comments)    Per pt unable to focus    Medication list has been reviewed and updated.  Current Outpatient Medications on File Prior to Visit  Medication Sig Dispense Refill  . albuterol (PROVENTIL HFA;VENTOLIN HFA) 108 (90 Base) MCG/ACT inhaler Inhale 2 puffs into the lungs every 6 (six) hours as needed for wheezing or shortness of breath. 1 Inhaler 2  . ASPIRIN 81 PO Take 81 mg by mouth daily.    Marland Kitchen atorvastatin (LIPITOR) 20 MG tablet TAKE 1 TABLET BY MOUTH DAILY. PLEASE KEEP UPCOMING APPT FOR FUTURE REFILLS. THANK YOU. 90 tablet 3  . carvedilol (COREG) 25 MG tablet Take 25 mg by mouth 2 (two) times daily with a meal. Take one tablet and a half, total of 37.5 mg twice a day.    . ferrous sulfate 325 (65 FE) MG tablet Take 325 mg by mouth every other day.    .  fluticasone (FLONASE) 50 MCG/ACT nasal spray Place 2 sprays into both nostrils daily as needed for allergies or rhinitis. 16 g 5  . Ginkgo Biloba (GINKOBA) 40 MG TABS Take 1 tablet by mouth every other day.    Marland Kitchen glipiZIDE (GLUCOTROL XL) 5 MG 24 hr tablet Take 1 tablet (5 mg total) by mouth 2 (two) times daily. 180 tablet 1  . latanoprost (XALATAN) 0.005 % ophthalmic solution Place 1 drop into both eyes at bedtime.     Marland Kitchen  losartan-hydrochlorothiazide (HYZAAR) 100-25 MG tablet Take 1 tablet by mouth daily. 90 tablet 3  . metFORMIN (GLUCOPHAGE) 1000 MG tablet Take 1 tablet (1,000 mg total) by mouth 2 (two) times daily with a meal. 180 tablet 1  . Multiple Vitamin (MULTIVITAMIN WITH MINERALS) TABS Take 1 tablet by mouth daily.     . Omega-3 Fatty Acids (FISH OIL BURP-LESS PO) Take 1 capsule by mouth every other day.     . [DISCONTINUED] losartan (COZAAR) 100 MG tablet Take 1 tablet (100 mg total) by mouth daily. 90 tablet 3   No current facility-administered medications on file prior to visit.     Review of Systems:  As per HPI- otherwise negative.   Physical Examination: There were no vitals filed for this visit. There were no vitals filed for this visit. There is no height or weight on file to calculate BMI. Ideal Body Weight:    Spoke to pt on phone today, he was not able to get video to work  Assessment and Plan: Jaw swelling - Plan: amoxicillin-clavulanate (AUGMENTIN) 875-125 MG tablet  Virtual visit today for recurrent jaw swelling and possible infection.  Patient does not report any alarm symptoms.  His symptoms have previously improved with antibiotics.  I put him on Augmentin, made an appointment for Wednesday so I can examine him in person.  If he is getting worse or having any change in his symptoms he will contact me in the meantime Can also check sodium at his follow-up visit Spoke to pt for 14 minutes  Signed Lamar Blinks, MD

## 2019-10-07 DIAGNOSIS — H40013 Open angle with borderline findings, low risk, bilateral: Secondary | ICD-10-CM | POA: Diagnosis not present

## 2019-10-07 LAB — IMMUNOFIXATION REFLEX, SERUM
IgA: 289 mg/dL (ref 61–437)
IgG (Immunoglobin G), Serum: 2294 mg/dL — ABNORMAL HIGH (ref 603–1613)
IgM (Immunoglobulin M), Srm: 23 mg/dL (ref 15–143)

## 2019-10-07 LAB — PROTEIN ELECTROPHORESIS, SERUM, WITH REFLEX
A/G Ratio: 1 (ref 0.7–1.7)
Albumin ELP: 3.7 g/dL (ref 2.9–4.4)
Alpha-1-Globulin: 0.2 g/dL (ref 0.0–0.4)
Alpha-2-Globulin: 0.9 g/dL (ref 0.4–1.0)
Beta Globulin: 0.9 g/dL (ref 0.7–1.3)
Gamma Globulin: 1.8 g/dL (ref 0.4–1.8)
Globulin, Total: 3.7 g/dL (ref 2.2–3.9)
M-Spike, %: 1.3 g/dL — ABNORMAL HIGH
SPEP Interpretation: 0
Total Protein ELP: 7.4 g/dL (ref 6.0–8.5)

## 2019-10-07 NOTE — Progress Notes (Addendum)
[facility] [address], [place], [place] [address] [phone]  Date:  10/08/2019   Name:  Michael Carrick Sr.   DOB:  10-11-1946   MRN:  [id number]  PCP:  Michael Mclean, MD    Chief Complaint: Facial Swelling (jaw pain and swelling)   History of Present Illness:  Michael Mulkern Sr. is a 73 y.o. very pleasant male patient who presents with the following:  Here today for an in office check.  We did a virtual visit on Monday, at which were he was concerned about possible infection in his left-sided sinuses or teeth. I started him on augmentin and planned for visit today He feels like this is generally getting better-swelling and pain are reduced He is able to chew gum, it does not hurt   No fever noted   Michael Wiggins has history of well-controlled diabetes, IgG kappa smoldering multiple myeloma, hypertension, hyperlipidemia, congestive heart failure and heart block with pacer  He saw his oncologist, Michael. Marin Wiggins, earlier this month.  His protein numbers have been stable  He sees his nephrologist Michael Wiggins about once a year - seen last month, I will request note He saw Michael Wiggins with cardiology in September: Assess/Plan: 1. CHB - he is asymptomatic, s/p BiV PPM. 2. Chronic systolic heart failure - his symptoms remain class 2. He has gone back to working out with minimal limitation except that he is sore. 3. HTN - his SBP is up today. He states that it is better at home.  4. Biv PPM - his [employer] Biv PPM is working normally. We will recheck in several months. He has some atrial lead noise and his sensitivity was reduce from 0.5 to 0.75 today.  Lab Results  Component Value Date   HGBA1C 7.7 (H) 05/20/2019    He is seeing his eye doctor- Michael Wiggins on a regular basis for some venous blood clots in his right eye only.  Michael. Nicki Wiggins had wondered if he might be at higher risk for TIA or stroke.  Michael Wiggins denies any TIA or stroke  symptoms that he can recall  He is working harder at his job- he had to let his assistant go as he was using drugs.  Michael Wiggins is working about 40 hours a week in his [employer], he states that he may lift up to 70 or 80 pounds on occasion  His sugar has been up and down more recently- taking metformin, glipizide On review of his blood sugar meter, his sugars have actually been generally well controlled.  Expect his A1c will look all right He is taking losartan/hctz 1/2 tablet daily-reduced his dose a couple weeks ago due to frequent nocturia.  He wonders if he can stop HCTZ entirely.  Looking back, he did have reduced EF on his echo in 2018, but this was prior to upgrade of his pacemaker.  He is taking lipitor 20 and omega 3 QOD for his lipids  Patient Active Problem List   Diagnosis Date Noted  . Iron deficiency anemia due to chronic blood loss 10/02/2019  . Chronic systolic (congestive) heart failure ([place]) 01/04/2018  . Pacemaker 12/08/2015  . Mobitz type 2 second degree heart block 08/08/2015  . Right bundle branch block 12/16/2014  . Syncope 12/16/2014  . Proteinuria due to type 2 diabetes mellitus ([place]) 11/12/2014  . Encounter for therapeutic drug monitoring 03/13/2013  . Multiple myeloma ([place]) 02/15/2013  .  Edema 01/07/2013  . Diabetes mellitus due to underlying condition with other diabetic kidney complication ([place])   . Hyperlipidemia   . Hypertension   . MGUS (monoclonal gammopathy of unknown significance)   . GERD (gastroesophageal reflux disease)     Past Medical History:  Diagnosis Date  . Anemia   . Cataract   . Chronic systolic (congestive) heart failure ([place])   . Diabetes mellitus   . GERD (gastroesophageal reflux disease)   . Hyperlipidemia   . Hypertension   . Iron deficiency anemia due to chronic blood loss 10/02/2019  . MGUS (monoclonal gammopathy of unknown significance)   . Multiple myeloma ([place]) 02/2013   normal cytogenetics and FISH panel on  03/11/2013.   . Osteoarthritis   . Presence of permanent cardiac pacemaker   . Right bundle branch block   . Syncope     Past Surgical History:  Procedure Laterality Date  . BIV UPGRADE  01/04/2018  . BIV UPGRADE N/A 01/04/2018   Procedure: BIVP UPGRADE;  Surgeon: Michael Lance, MD;  Location: [facility];  Service: Cardiovascular;  Laterality: N/A;  . EP IMPLANTABLE DEVICE N/A 08/09/2015   Procedure: Pacemaker Implant;  Surgeon: Michael Lance, MD;  Location: [facility];  Service: Cardiovascular;  Laterality: N/A;  . EYE SURGERY      Social History   Tobacco Use  . Smoking status: Former Smoker    Packs/day: 2.00    Years: 35.00    Pack years: 70.00    Types: Cigarettes    Start date: 03/26/1963    Quit date: 12/18/1997    Years since quitting: 21.8  . Smokeless tobacco: Never Used  . Tobacco comment: quit 15 years  Substance Use Topics  . Alcohol use: No    Alcohol/week: 0.0 standard drinks  . Drug use: No    Family History  Problem Relation Age of Onset  . Hypertension Mother   . Cancer Mother   . Stroke Mother   . Hypertension Sister   . Hypertension Brother   . Hypertension Maternal Grandmother     Allergies  Allergen Reactions  . Amlodipine Swelling  . Crestor [Rosuvastatin] Other (See Comments)    Per pt unable to focus    Medication list has been reviewed and updated.  Current Outpatient Medications on File Prior to Visit  Medication Sig Dispense Refill  . albuterol (PROVENTIL HFA;VENTOLIN HFA) 108 (90 Base) MCG/ACT inhaler Inhale 2 puffs into the lungs every 6 (six) hours as needed for wheezing or shortness of breath. 1 Inhaler 2  . amoxicillin-clavulanate (AUGMENTIN) 875-125 MG tablet Take 1 tablet by mouth 2 (two) times daily. 20 tablet 0  . ASPIRIN 81 PO Take 81 mg by mouth daily.    Marland Kitchen atorvastatin (LIPITOR) 20 MG tablet TAKE 1 TABLET BY MOUTH DAILY. PLEASE KEEP UPCOMING APPT FOR FUTURE REFILLS. THANK YOU. 90 tablet 3  . carvedilol  (COREG) 25 MG tablet Take 25 mg by mouth 2 (two) times daily with a meal. Take one tablet and a half, total of 37.5 mg twice a day.    . ferrous sulfate 325 (65 FE) MG tablet Take 325 mg by mouth every other day.    . fluticasone (FLONASE) 50 MCG/ACT nasal spray Place 2 sprays into both nostrils daily as needed for allergies or rhinitis. 16 g 5  . Ginkgo Biloba (GINKOBA) 40 MG TABS Take 1 tablet by mouth every other day.    Marland Kitchen glipiZIDE (GLUCOTROL XL) 5  MG 24 hr tablet Take 1 tablet (5 mg total) by mouth 2 (two) times daily. 180 tablet 1  . latanoprost (XALATAN) 0.005 % ophthalmic solution Place 1 drop into both eyes at bedtime.     Marland Kitchen losartan-hydrochlorothiazide (HYZAAR) 100-25 MG tablet Take 1 tablet by mouth daily. (Patient taking differently: Take 0.5 tablets by mouth daily. ) 90 tablet 3  . metFORMIN (GLUCOPHAGE) 1000 MG tablet Take 1 tablet (1,000 mg total) by mouth 2 (two) times daily with a meal. 180 tablet 1  . Multiple Vitamin (MULTIVITAMIN WITH MINERALS) TABS Take 1 tablet by mouth daily.     . Omega-3 Fatty Acids (FISH OIL BURP-LESS PO) Take 1 capsule by mouth every other day.     . [DISCONTINUED] losartan (COZAAR) 100 MG tablet Take 1 tablet (100 mg total) by mouth daily. 90 tablet 3   No current facility-administered medications on file prior to visit.     Review of Systems:  As per HPI- otherwise negative. No fever or chills  Physical Examination: Vitals:   10/08/19 1124  BP: (!) 148/82  Pulse: 66  Resp: 16  Temp: (!) 96.7 F (35.9 C)  SpO2: 100%   Vitals:   10/08/19 1124  Weight: 216 lb (98 kg)  Height: _0  (1.905 m)   Body mass index is 27 kg/m. Ideal Body Weight: Weight in (lb) to have BMI = 25: 199.6  GEN: WDWN, NAD, Non-toxic, A & O x 3, normal weight, tall build.  Looks well HEENT: Atraumatic, Normocephalic. Neck supple. No masses, No LAD.  Bilateral TM wnl, oropharynx normal.  PEERL,EOMI. there is minimal puffiness in the left nasolabial fold.  The  patient states this is where he was having his pain and swelling, it is improved No redness noted No carotid bruit Ears and Nose: No external deformity. CV: RRR, No M/G/R. No JVD. No thrill. No extra heart sounds. PULM: CTA B, no wheezes, crackles, rhonchi. No retractions. No resp. distress. No accessory muscle use. ABD: S, NT, ND, +BS. No rebound. No HSM. EXTR: No c/c/e NEURO Normal gait.  PSYCH: Normally interactive. Conversant. Not depressed or anxious appearing.  Calm demeanor.    Assessment and Plan: Diabetes mellitus due to underlying condition with other diabetic kidney complication ([place]) - Plan: Hemoglobin A1c  Essential hypertension - Plan: US Carotid Duplex Bilateral, Basic metabolic panel  MGUS (monoclonal gammopathy of unknown significance)  Jaw swelling  Here today for a follow-up visit.  Check A1c today Blood pressure under reasonable control, noted hyponatremia on recent labs.  Michael Wiggins would like to come off HCTZ if he can, due to urinary frequency at night.  This is probably a reasonable idea, but I do question if he needs diuretic due to possible CHF.  Plan to get his BMP results back, then I will touch base with Michael. Lovena Wiggins Continue augmentin- infection of jaw/ sinuses is responding well   Signed Lamar Blinks, MD  I called and LMOM with Michael Wiggins OD- on his cell phone.  We are doing carotid dopplers. Let me know if he would like me to do anything further   Addendum 10/22, received labs Message to patient  I also heard back from Michael. Nicki Wiggins, who agreed with carotid Dopplers and suggested that Michael Wiggins take a baby aspirin daily.  I believe he is already doing so, but will confirm this  Results for orders placed or performed in visit on 50/38/88  Basic metabolic panel  Result Value Ref Range   Sodium  130 (L) 135 - 145 mEq/L   Potassium 5.0 3.5 - 5.1 mEq/L   Chloride 98 96 - 112 mEq/L   CO2 26 19 - 32 mEq/L   Glucose, Bld 113 (H) 70 - 99 mg/dL   BUN 33 (H) 6 - 23  mg/dL   Creatinine, Ser 1.19 0.40 - 1.50 mg/dL   Calcium 9.7 8.4 - 10.5 mg/dL   GFR 72.32 >60.00 mL/min  Hemoglobin A1c  Result Value Ref Range   Hgb A1c MFr Bld 7.4 (H) 4.6 - 6.5 %   Your A1c looks good, diabetes control is fine Sodium is better but still little bit low Let me touch base with Michael. Lovena Wiggins about coming off HCTZ  I got in touch with Michael. Nicki Wiggins as well, he suggested that you take a baby aspirin daily.  I believe you are already doing so, but wanted to make sure  I will let you know Michael. Lovena Wiggins says, let us visit in 6 months-----------------------------  addnd 10/24- Michael Wiggins ok with DC hctz.  Message to pt- will send in plain losartan for him 100 mg. Try 1/2 pill first, assuming BP not low increase to 100 mg    Addendum 10/28, received his carotid ultrasound report- US Carotid Duplex Bilateral  Result Date: 10/14/2019 CLINICAL DATA:  73 year old male with a history of abnormal eye exam EXAM: BILATERAL CAROTID DUPLEX ULTRASOUND TECHNIQUE: Pearline Cables scale imaging, color Doppler and duplex ultrasound were performed of bilateral carotid and vertebral arteries in the neck. COMPARISON:  None. FINDINGS: Criteria: Quantification of carotid stenosis is based on velocity parameters that correlate the residual internal carotid diameter with NASCET-based stenosis levels, using the diameter of the distal internal carotid lumen as the denominator for stenosis measurement. The following velocity measurements were obtained: RIGHT ICA:  Systolic 161 cm/sec, Diastolic 38 cm/sec CCA:  096 cm/sec SYSTOLIC ICA/CCA RATIO:  0.9 ECA:  87 cm/sec LEFT ICA:  Systolic 88 cm/sec, Diastolic 31 cm/sec CCA:  045 cm/sec SYSTOLIC ICA/CCA RATIO:  0.8 ECA:  98 cm/sec Right Brachial SBP: 137 Left Brachial SBP: 139 RIGHT CAROTID ARTERY: No significant calcified disease of the right common carotid artery. Intermediate waveform maintained. Heterogeneous plaque without significant calcifications at the right carotid  bifurcation. Low resistance waveform of the right ICA. Tortuosity. RIGHT VERTEBRAL ARTERY: Antegrade flow with low resistance waveform. LEFT CAROTID ARTERY: No significant calcified disease of the left common carotid artery. Intermediate waveform maintained. Heterogeneous plaque at the left carotid bifurcation without significant calcifications. Low resistance waveform of the left ICA. Tortuosity LEFT VERTEBRAL ARTERY:  Antegrade flow with low resistance waveform. IMPRESSION: Color duplex indicates moderate heterogeneous plaque with no hemodynamically significant stenosis by duplex criteria in the extracranial cerebrovascular circulation. Signed, Dulcy Fanny. Dellia Nims, RPVI [facility] Electronically Signed   By: Corrie Mckusick D.O.   On: 10/14/2019 14:03   Looks okay, will send copy to his optometrist Michael. Nicki Wiggins

## 2019-10-08 ENCOUNTER — Encounter: Payer: Self-pay | Admitting: Family Medicine

## 2019-10-08 ENCOUNTER — Other Ambulatory Visit: Payer: Self-pay

## 2019-10-08 ENCOUNTER — Encounter: Payer: Self-pay | Admitting: *Deleted

## 2019-10-08 ENCOUNTER — Ambulatory Visit (INDEPENDENT_AMBULATORY_CARE_PROVIDER_SITE_OTHER): Payer: Medicare Other | Admitting: Family Medicine

## 2019-10-08 VITALS — BP 148/82 | HR 66 | Temp 96.7°F | Resp 16 | Ht 75.0 in | Wt 216.0 lb

## 2019-10-08 DIAGNOSIS — D472 Monoclonal gammopathy: Secondary | ICD-10-CM | POA: Diagnosis not present

## 2019-10-08 DIAGNOSIS — I10 Essential (primary) hypertension: Secondary | ICD-10-CM | POA: Diagnosis not present

## 2019-10-08 DIAGNOSIS — R220 Localized swelling, mass and lump, head: Secondary | ICD-10-CM | POA: Diagnosis not present

## 2019-10-08 DIAGNOSIS — E0829 Diabetes mellitus due to underlying condition with other diabetic kidney complication: Secondary | ICD-10-CM

## 2019-10-08 LAB — BASIC METABOLIC PANEL
BUN: 33 mg/dL — ABNORMAL HIGH (ref 6–23)
CO2: 26 mEq/L (ref 19–32)
Calcium: 9.7 mg/dL (ref 8.4–10.5)
Chloride: 98 mEq/L (ref 96–112)
Creatinine, Ser: 1.19 mg/dL (ref 0.40–1.50)
GFR: 72.32 mL/min (ref >60.00)
Glucose, Bld: 113 mg/dL — ABNORMAL HIGH (ref 70–99)
Potassium: 5 mEq/L (ref 3.5–5.1)
Sodium: 130 mEq/L — ABNORMAL LOW (ref 135–145)

## 2019-10-08 LAB — HEMOGLOBIN A1C: Hgb A1c MFr Bld: 7.4 % — ABNORMAL HIGH (ref 4.6–6.5)

## 2019-10-08 NOTE — Patient Instructions (Signed)
Great to see you today as always I am going to arrange carotid artery (neck) ultrasound to make sure your arteries are not clogged- this can increase stroke risk I do think taking you off hctz may be helpful for you.  Let me get your labs back from today and I will contact Dr Lovena Le- will let you know what he says We will check on your A1c and sodium levels today

## 2019-10-09 ENCOUNTER — Encounter: Payer: Self-pay | Admitting: Family Medicine

## 2019-10-09 DIAGNOSIS — N189 Chronic kidney disease, unspecified: Secondary | ICD-10-CM | POA: Insufficient documentation

## 2019-10-11 ENCOUNTER — Encounter: Payer: Self-pay | Admitting: Family Medicine

## 2019-10-11 MED ORDER — LOSARTAN POTASSIUM 100 MG PO TABS: 100.0000 mg | ORAL_TABLET | Freq: Every day | ORAL | 3 refills | Status: DC

## 2019-10-11 NOTE — Addendum Note (Signed)
Addended by: Lamar Blinks C on: 10/11/2019 09:39 AM   Modules accepted: Orders

## 2019-10-14 ENCOUNTER — Ambulatory Visit (HOSPITAL_BASED_OUTPATIENT_CLINIC_OR_DEPARTMENT_OTHER)
Admission: RE | Admit: 2019-10-14 | Discharge: 2019-10-14 | Disposition: A | Discharge status: Home / Self Care | Payer: Medicare Other | Source: Ambulatory Visit | Attending: Family Medicine | Admitting: Family Medicine

## 2019-10-14 ENCOUNTER — Other Ambulatory Visit: Payer: Self-pay

## 2019-10-14 DIAGNOSIS — I10 Essential (primary) hypertension: Secondary | ICD-10-CM | POA: Diagnosis not present

## 2019-10-14 DIAGNOSIS — I6523 Occlusion and stenosis of bilateral carotid arteries: Secondary | ICD-10-CM | POA: Diagnosis not present

## 2019-10-15 ENCOUNTER — Encounter: Payer: Self-pay | Admitting: Family Medicine

## 2019-10-20 IMAGING — CR DG CHEST 2V
2 series · 2 of 2 positions shown · non-contrast
Comparison: November 16, 2017

CLINICAL DATA: Pacemaker upgrade.  Hypertension.

EXAM:
CHEST  2 VIEW

[chest pa]
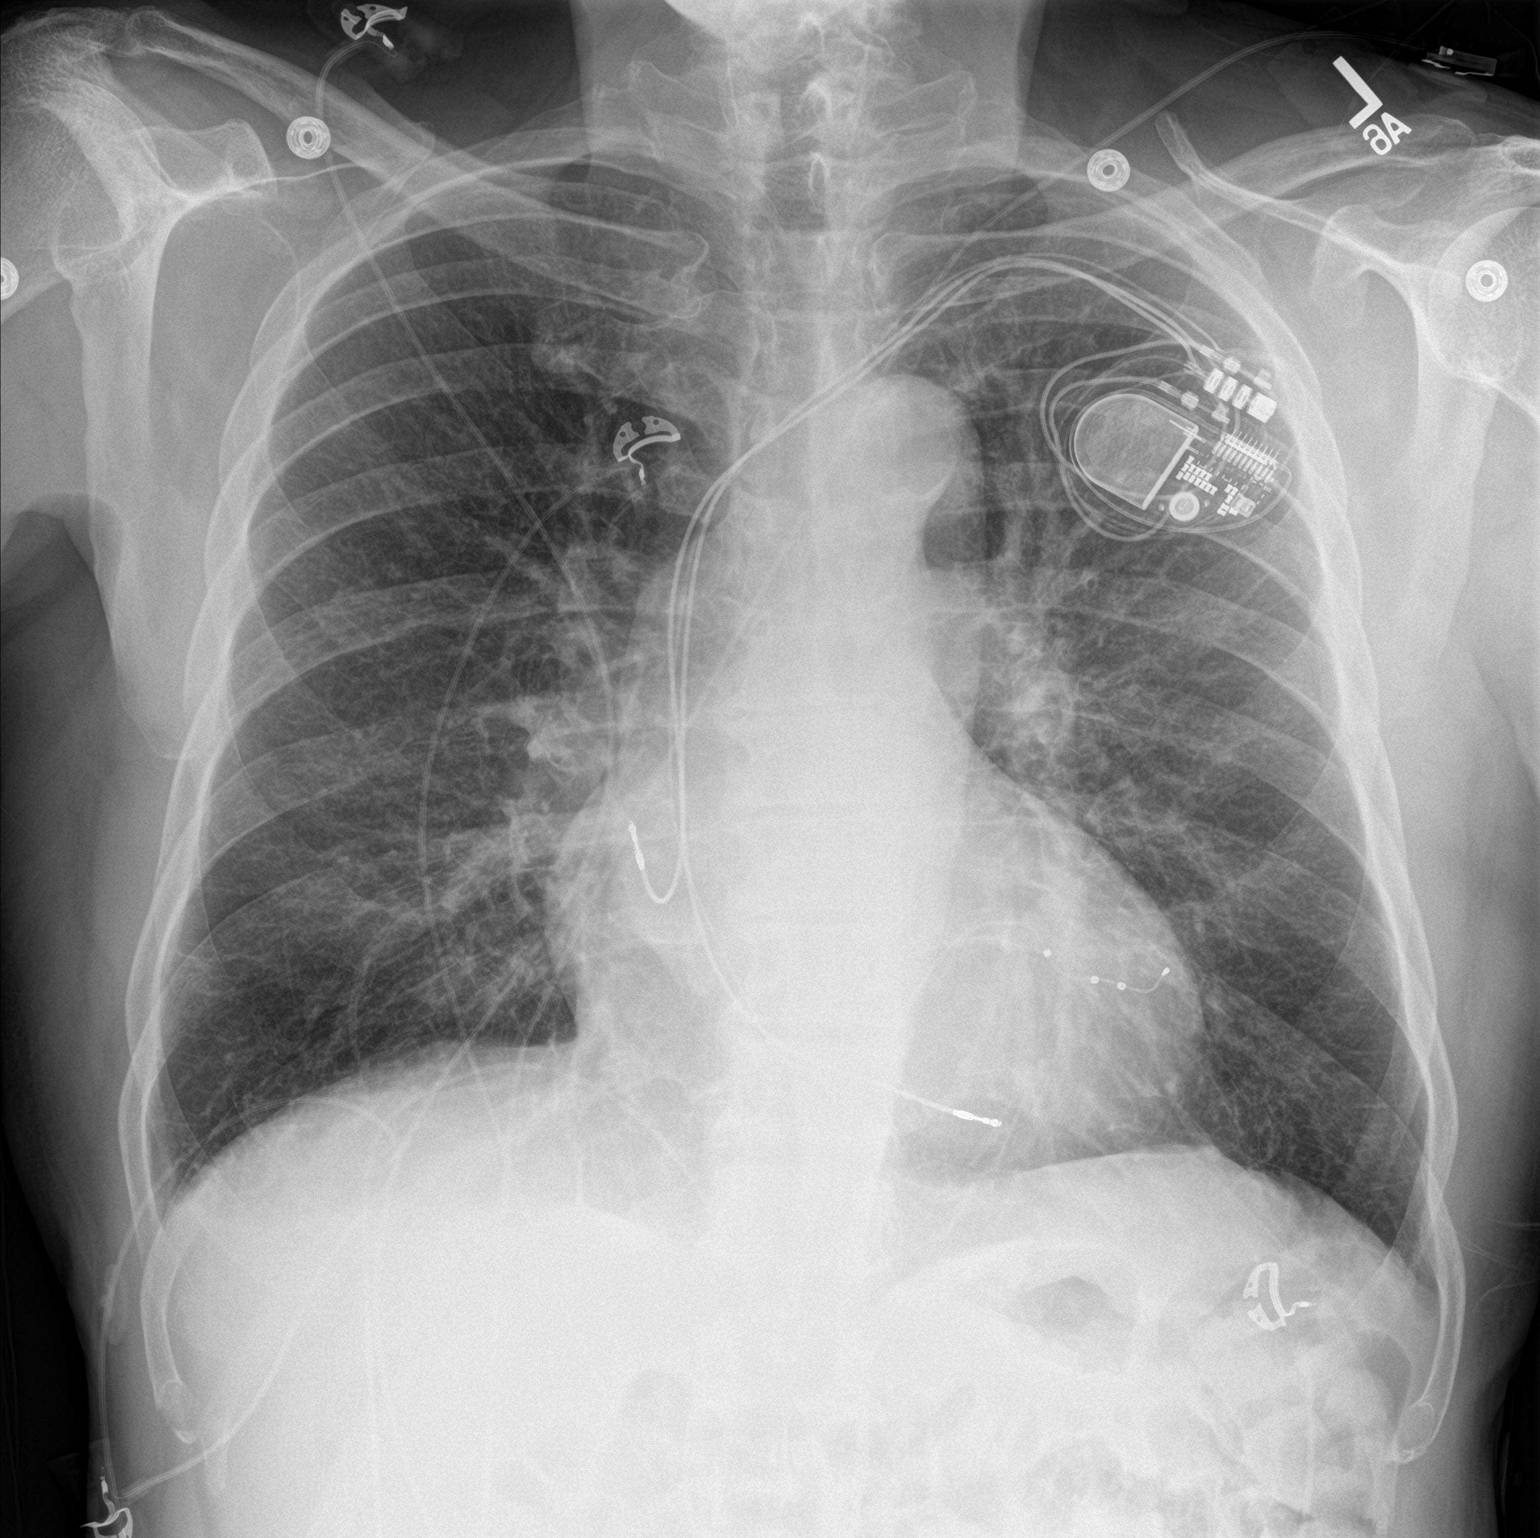

[chest lat]
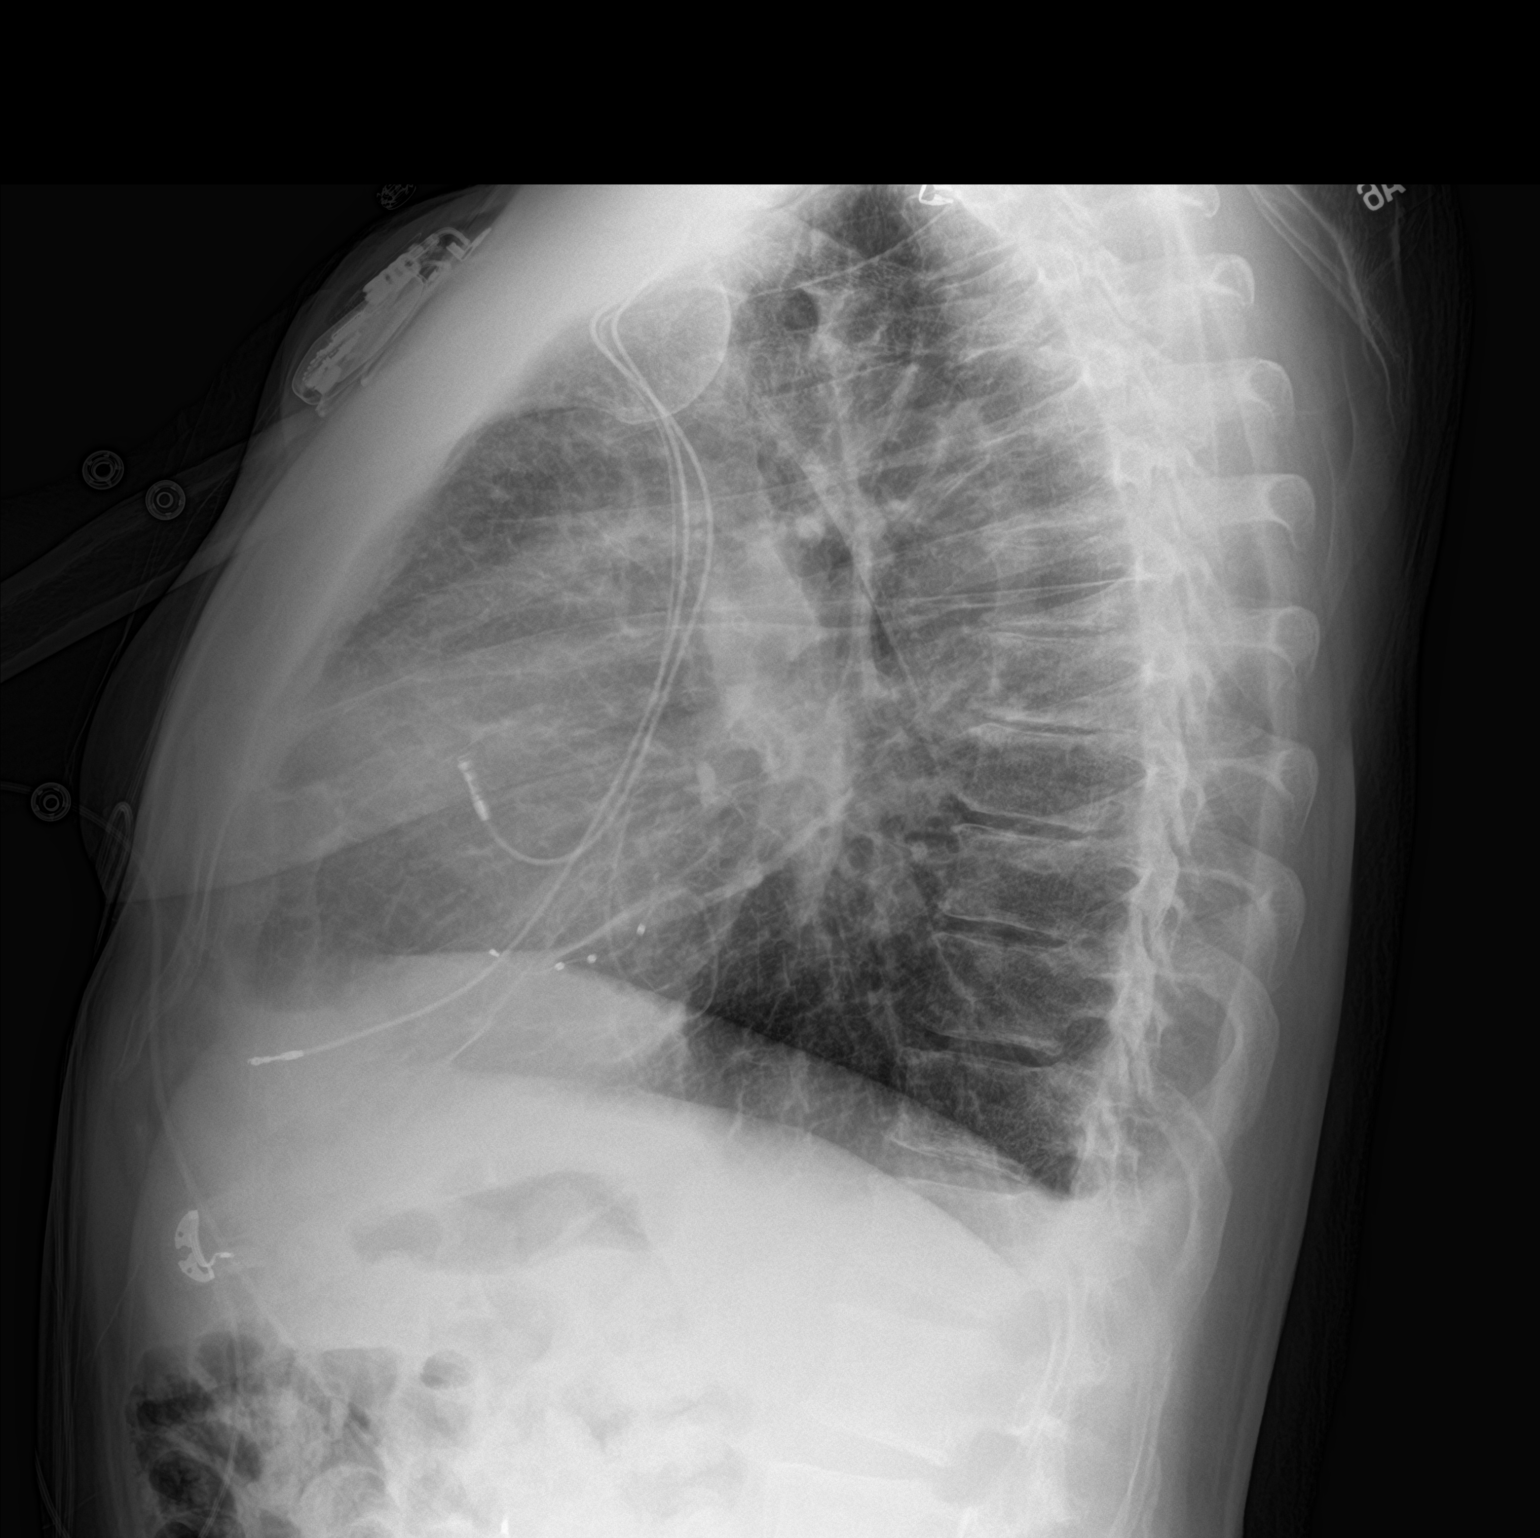

[2 of 2 positions shown; findings below may reference images not displayed]

FINDINGS: Pacemaker present on the left. Lead tips are attached to the right
atrium, right ventricle, and coronary sinus. No pneumothorax. There
is mild bibasilar scarring. There is no edema or consolidation.
Heart is upper normal in size with pulmonary vascularity within
normal limits. There is aortic atherosclerosis. No evident bone
lesions.
IMPRESSION: Pacemaker leads attached to right atrium, right ventricle, and
coronary sinus. No pneumothorax. Mild scarring in the bases. No
edema or consolidation. Stable cardiac silhouette. There is aortic
atherosclerosis.

Aortic Atherosclerosis (0AOMG-L3L.L).

## 2019-10-22 ENCOUNTER — Encounter: Payer: Self-pay | Admitting: Family Medicine

## 2019-10-23 ENCOUNTER — Telehealth: Payer: Self-pay

## 2019-10-23 MED ORDER — ENTRESTO 24-26 MG PO TABS: 1.0000 | ORAL_TABLET | Freq: Two times a day (BID) | ORAL | 11 refills | Status: DC

## 2019-10-23 NOTE — Telephone Encounter (Signed)
Per Dr. Sherron Ales Pt discontinue losartan and start Entresto 24/26 BID.  F/u appt made with pharmacy.  Pt notified via mychart.

## 2019-10-27 NOTE — Progress Notes (Signed)
Remote pacemaker transmission.   

## 2019-10-31 NOTE — Progress Notes (Signed)
Virtual Visit via Video Note  I connected with patient on 11/03/19 at 10:00 AM EST by audio enabled telemedicine application and verified that I am speaking with the correct person using two identifiers.   THIS ENCOUNTER IS A VIRTUAL VISIT DUE TO COVID-19 - PATIENT WAS NOT SEEN IN THE OFFICE. PATIENT HAS CONSENTED TO VIRTUAL VISIT / TELEMEDICINE VISIT   Location of patient: home  Location of provider: office  I discussed the limitations of evaluation and management by telemedicine and the availability of in person appointments. The patient expressed understanding and agreed to proceed.   Subjective:   Michael Confer Sr. is a 73 y.o. male who presents for Medicare Annual/Subsequent preventive examination.  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   Review of Systems:  Home Safety/Smoke Alarms: Feels safe in home. Smoke alarms in place.  Lives with wife in 2 story home. Wife also still works. No trouble navigating stairs.   Male:   CCS- last 05/03/15. 5 yr recall.     PSA-  Lab Results  Component Value Date   PSA 5.31 (H) 03/13/2018   PSA 2.87 06/07/2015   PSA 2.88 01/20/2014       Objective:    Vitals: BP 138/84 Comment: pt reported vitals    Advanced Directives 11/03/2019 10/02/2019 04/03/2019 10/31/2018 04/03/2018 03/01/2018 01/04/2018  Does Patient Have a Medical Advance Directive? No No No No No No No  Does patient want to make changes to medical advance directive? No - Patient declined - - Yes (MAU/Ambulatory/Procedural Areas - Information given) - - -  Would patient like information on creating a medical advance directive? - No - Patient declined - - No - Patient declined No - Patient declined No - Patient declined    Tobacco Social History   Tobacco Use  Smoking Status Former Smoker  . Packs/day: 2.00  . Years: 35.00  . Pack years: 70.00  . Types: Cigarettes  . Start date: 03/26/1963  . Quit date: 12/18/1997  . Years since quitting: 21.8  Smokeless Tobacco Never  Used  Tobacco Comment   quit 15 years     Counseling given: Not Answered Comment: quit 15 years   Clinical Intake: Pain : No/denies pain     Past Medical History:  Diagnosis Date  . Anemia   . Cataract   . Chronic systolic (congestive) heart failure ([place])   . Diabetes mellitus   . GERD (gastroesophageal reflux disease)   . Hyperlipidemia   . Hypertension   . Iron deficiency anemia due to chronic blood loss 10/02/2019  . MGUS (monoclonal gammopathy of unknown significance)   . Multiple myeloma ([place]) 02/2013   normal cytogenetics and FISH panel on 03/11/2013.   . Osteoarthritis   . Presence of permanent cardiac pacemaker   . Right bundle branch block   . Syncope    Past Surgical History:  Procedure Laterality Date  . BIV UPGRADE  01/04/2018  . BIV UPGRADE N/A 01/04/2018   Procedure: BIVP UPGRADE;  Surgeon: [name], MD;  Location: [facility];  Service: Cardiovascular;  Laterality: N/A;  . EP IMPLANTABLE DEVICE N/A 08/09/2015   Procedure: Pacemaker Implant;  Surgeon: [name], MD;  Location: [facility];  Service: Cardiovascular;  Laterality: N/A;  . EYE SURGERY     Family History  Problem Relation Age of Onset  . Hypertension Mother   . Cancer Mother   . Stroke Mother   . Hypertension Sister   .  Hypertension Brother   . Hypertension Maternal Grandmother    Social History   Socioeconomic History  . Marital status: Married    Spouse name: Not on file  . Number of children: 3  . Years of education: Not on file  . Highest education level: Not on file  Occupational History  . Not on file  Social Needs  . Financial resource strain: Not on file  . Food insecurity    Worry: Not on file    Inability: Not on file  . Transportation needs    Medical: Not on file    Non-medical: Not on file  Tobacco Use  . Smoking status: Former Smoker    Packs/day: 2.00    Years: 35.00    Pack years: 70.00    Types: Cigarettes    Start date: 03/26/1963     Quit date: 12/18/1997    Years since quitting: 21.8  . Smokeless tobacco: Never Used  . Tobacco comment: quit 15 years  Substance and Sexual Activity  . Alcohol use: No    Alcohol/week: 0.0 standard drinks  . Drug use: No  . Sexual activity: Yes    Birth control/protection: None  Lifestyle  . Physical activity    Days per week: Not on file    Minutes per session: Not on file  . Stress: Not on file  Relationships  . Social Herbalist on phone: Not on file    Gets together: Not on file    Attends religious service: Not on file    Active member of club or organization: Not on file    Attends meetings of clubs or organizations: Not on file    Relationship status: Not on file  Other Topics Concern  . Not on file  Social History Narrative   Married   Building Maintenance    Outpatient Encounter Medications as of 11/03/2019  Medication Sig  . albuterol (PROVENTIL HFA;VENTOLIN HFA) 108 (90 Base) MCG/ACT inhaler Inhale 2 puffs into the lungs every 6 (six) hours as needed for wheezing or shortness of breath.  . ASPIRIN 81 PO Take 81 mg by mouth daily.  Marland Kitchen atorvastatin (LIPITOR) 20 MG tablet TAKE 1 TABLET BY MOUTH DAILY. PLEASE KEEP UPCOMING APPT FOR FUTURE REFILLS. THANK YOU.  . carvedilol (COREG) 25 MG tablet Take 25 mg by mouth 2 (two) times daily with a meal. Take one tablet and a half, total of 37.5 mg twice a day.  . ferrous sulfate 325 (65 FE) MG tablet Take 325 mg by mouth every other day.  . fluticasone (FLONASE) 50 MCG/ACT nasal spray Place 2 sprays into both nostrils daily as needed for allergies or rhinitis.  . Ginkgo Biloba (GINKOBA) 40 MG TABS Take 1 tablet by mouth every other day.  Marland Kitchen glipiZIDE (GLUCOTROL XL) 5 MG 24 hr tablet Take 1 tablet (5 mg total) by mouth 2 (two) times daily.  Marland Kitchen latanoprost (XALATAN) 0.005 % ophthalmic solution Place 1 drop into both eyes at bedtime.   . metFORMIN (GLUCOPHAGE) 1000 MG tablet Take 1 tablet (1,000 mg total) by mouth 2 (two)  times daily with a meal.  . Multiple Vitamin (MULTIVITAMIN WITH MINERALS) TABS Take 1 tablet by mouth daily.   . Omega-3 Fatty Acids (FISH OIL BURP-LESS PO) Take 1 capsule by mouth every other day.   . sacubitril-valsartan (ENTRESTO) 24-26 MG Take 1 tablet by mouth 2 (two) times daily. Discontinue losartan when you start Entresto.  . [DISCONTINUED] amoxicillin-clavulanate (AUGMENTIN) 875-125 MG  tablet Take 1 tablet by mouth 2 (two) times daily.   No facility-administered encounter medications on file as of 11/03/2019.     Activities of Daily Living In your present state of health, do you have any difficulty performing the following activities: 11/03/2019  Hearing? N  Vision? N  Difficulty concentrating or making decisions? N  Walking or climbing stairs? N  Dressing or bathing? N  Doing errands, shopping? N  Preparing Food and eating ? N  Using the Toilet? N  In the past six months, have you accidently leaked urine? N  Do you have problems with loss of bowel control? N  Managing your Medications? N  Managing your Finances? N  Housekeeping or managing your Housekeeping? N  Some recent data might be hidden    Patient Care Team: [name], [name], MD as PCP - General (Family Medicine) Edrick Oh, MD (Nephrology) Macarthur Critchley, [place] as Consulting Physician (Optometry) Marin Olp, Rudell Cobb, MD as Consulting Physician (Oncology)   Assessment:   This is a routine wellness examination for [employer]. Physical assessment deferred to PCP.  Exercise Activities and Dietary recommendations Current Exercise Habits: Home exercise routine, Time (Minutes): 15, Frequency (Times/Week): 5, Weekly Exercise (Minutes/Week): 75, Intensity: Mild, Exercise limited by: None identified Diet (meal preparation, eat out, water intake, caffeinated beverages, dairy products, fruits and vegetables): well balanced     Goals    . Patient Stated     Maintain current health       Fall Risk Fall Risk  11/03/2019  10/31/2018 07/10/2018 12/13/2017 11/23/2016  Falls in the past year? 0 0 No No No  Comment - - - - -  Number falls in past yr: 0 - - - -  Injury with Fall? 0 - - - -  Risk for fall due to : - - - - -    Depression Screen PHQ 2/9 Scores 10/31/2018 07/10/2018 12/13/2017 11/23/2016  PHQ - 2 Score 0 0 0 0  Exception Documentation - - - -    Cognitive Function Ad8 score reviewed for issues:  Issues making decisions:no  Less interest in hobbies / activities:no  Repeats questions, stories (family complaining):no  Trouble using ordinary gadgets (microwave, computer, phone):no  Forgets the month or year: no  Mismanaging finances: no  Remembering appts:no  Daily problems with thinking and/or memory:no Ad8 score is=0        Immunization History  Administered Date(s) Administered  . Influenza Split 11/04/2012  . Influenza, High Dose Seasonal PF 08/24/2016, 12/13/2017, 09/24/2019  . Influenza,inj,Quad PF,6+ Mos 09/21/2014, 09/16/2015, 10/02/2018  . Influenza-Unspecified 10/10/2019  . Pneumococcal Conjugate-13 06/01/2014  . Pneumococcal Polysaccharide-23 05/06/2012  . Td 10/17/2004  . Tdap 11/21/2014   Screening Tests Health Maintenance  Topic Date Due  . URINE MICROALBUMIN  07/30/2016  . OPHTHALMOLOGY EXAM  01/01/2020  . FOOT EXAM  02/13/2020  . HEMOGLOBIN A1C  04/07/2020  . TETANUS/TDAP  11/21/2024  . COLONOSCOPY  05/04/2025  . INFLUENZA VACCINE  Completed  . Hepatitis C Screening  Completed  . PNA vac Low Risk Adult  Completed       Plan:    Please schedule your next medicare wellness visit with me in 1 yr.  Continue to eat heart healthy diet (full of fruits, vegetables, whole grains, lean protein, water--limit salt, fat, and sugar intake) and increase physical activity as tolerated.  Continue doing brain stimulating activities (puzzles, reading, adult coloring books, staying active) to keep memory sharp.    I have personally reviewed  and noted the following in  the patient's chart:   . Medical and social history . Use of alcohol, tobacco or illicit drugs  . Current medications and supplements . Functional ability and status . Nutritional status . Physical activity . Advanced directives . List of other physicians . Hospitalizations, surgeries, and ER visits in previous 12 months . Vitals . Screenings to include cognitive, depression, and falls . Referrals and appointments  In addition, I have reviewed and discussed with patient certain preventive protocols, quality metrics, and best practice recommendations. A written personalized care plan for preventive services as well as general preventive health recommendations were provided to patient.     Shela Nevin, [place]  11/03/2019

## 2019-11-03 ENCOUNTER — Other Ambulatory Visit: Payer: Self-pay

## 2019-11-03 ENCOUNTER — Encounter: Payer: Self-pay | Admitting: *Deleted

## 2019-11-03 ENCOUNTER — Ambulatory Visit (INDEPENDENT_AMBULATORY_CARE_PROVIDER_SITE_OTHER): Payer: Medicare Other | Admitting: *Deleted

## 2019-11-03 VITALS — BP 138/84

## 2019-11-03 DIAGNOSIS — Z0000 Encounter for general adult medical examination without abnormal findings: Secondary | ICD-10-CM

## 2019-11-03 NOTE — Patient Instructions (Signed)
Please schedule your next medicare wellness visit with me in 1 yr.  Continue to eat heart healthy diet (full of fruits, vegetables, whole grains, lean protein, water--limit salt, fat, and sugar intake) and increase physical activity as tolerated.  Continue doing brain stimulating activities (puzzles, reading, adult coloring books, staying active) to keep memory sharp.    Michael Wiggins , Thank you for taking time to come for your Medicare Wellness Visit. I appreciate your ongoing commitment to your health goals. Please review the following plan we discussed and let me know if I can assist you in the future.   These are the goals we discussed: Goals    . Patient Stated     Maintain current health       This is a list of the screening recommended for you and due dates:  Health Maintenance  Topic Date Due  . Urine Protein Check  07/30/2016  . Eye exam for diabetics  01/01/2020  . Complete foot exam   02/13/2020  . Hemoglobin A1C  04/07/2020  . Tetanus Vaccine  11/21/2024  . Colon Cancer Screening  05/04/2025  . Flu Shot  Completed  .  Hepatitis C: One time screening is recommended by Center for Disease Control  (CDC) for  adults born from [birth year] through 1966.   Completed  . Pneumonia vaccines  Completed    Preventive Care 73 Years and Older, Male Preventive care refers to lifestyle choices and visits with your health care provider that can promote health and wellness. This includes:  A yearly physical exam. This is also called an annual well check.  Regular dental and eye exams.  Immunizations.  Screening for certain conditions.  Healthy lifestyle choices, such as diet and exercise. What can I expect for my preventive care visit? Physical exam Your health care provider will check:  Height and weight. These may be used to calculate body mass index (BMI), which is a measurement that tells if you are at a healthy weight.  Heart rate and blood pressure.  Your skin for  abnormal spots. Counseling Your health care provider may ask you questions about:  Alcohol, tobacco, and drug use.  Emotional well-being.  Home and relationship well-being.  Sexual activity.  Eating habits.  History of falls.  Memory and ability to understand (cognition).  Work and work Statistician. What immunizations do I need?  Influenza (flu) vaccine  This is recommended every year. Tetanus, diphtheria, and pertussis (Tdap) vaccine  You may need a Td booster every 10 years. Varicella (chickenpox) vaccine  You may need this vaccine if you have not already been vaccinated. Zoster (shingles) vaccine  You may need this after age 22. Pneumococcal conjugate (PCV13) vaccine  One dose is recommended after age 52. Pneumococcal polysaccharide (PPSV23) vaccine  One dose is recommended after age 36. Measles, mumps, and rubella (MMR) vaccine  You may need at least one dose of MMR if you were born in 1957 or later. You may also need a second dose. Meningococcal conjugate (MenACWY) vaccine  You may need this if you have certain conditions. Hepatitis A vaccine  You may need this if you have certain conditions or if you travel or work in places where you may be exposed to hepatitis A. Hepatitis B vaccine  You may need this if you have certain conditions or if you travel or work in places where you may be exposed to hepatitis B. Haemophilus influenzae type b (Hib) vaccine  You may need this if you have  certain conditions. You may receive vaccines as individual doses or as more than one vaccine together in one shot (combination vaccines). Talk with your health care provider about the risks and benefits of combination vaccines. What tests do I need? Blood tests  Lipid and cholesterol levels. These may be checked every 5 years, or more frequently depending on your overall health.  Hepatitis C test.  Hepatitis B test. Screening  Lung cancer screening. You may have this  screening every year starting at age 57 if you have a 30-pack-year history of smoking and currently smoke or have quit within the past 15 years.  Colorectal cancer screening. All adults should have this screening starting at age 66 and continuing until age 54. Your health care provider may recommend screening at age 41 if you are at increased risk. You will have tests every 1-10 years, depending on your results and the type of screening test.  Prostate cancer screening. Recommendations will vary depending on your family history and other risks.  Diabetes screening. This is done by checking your blood sugar (glucose) after you have not eaten for a while (fasting). You may have this done every 1-3 years.  Abdominal aortic aneurysm (AAA) screening. You may need this if you are a current or former smoker.  Sexually transmitted disease (STD) testing. Follow these instructions at home: Eating and drinking  Eat a diet that includes fresh fruits and vegetables, whole grains, lean protein, and low-fat dairy products. Limit your intake of foods with high amounts of sugar, saturated fats, and salt.  Take vitamin and mineral supplements as recommended by your health care provider.  Do not drink alcohol if your health care provider tells you not to drink.  If you drink alcohol: ? Limit how much you have to 0-2 drinks a day. ? Be aware of how much alcohol is in your drink. In the [place] one drink equals one 12 oz bottle of beer (355 mL), one 5 oz glass of wine (148 mL), or one 1 oz glass of hard liquor (44 mL). Lifestyle  Take daily care of your teeth and gums.  Stay active. Exercise for at least 30 minutes on 5 or more days each week.  Do not use any products that contain nicotine or tobacco, such as cigarettes, e-cigarettes, and chewing tobacco. If you need help quitting, ask your health care provider.  If you are sexually active, practice safe sex. Use a condom or other form of protection to  prevent STIs (sexually transmitted infections).  Talk with your health care provider about taking a low-dose aspirin or statin. What's next?  Visit your health care provider once a year for a well check visit.  Ask your health care provider how often you should have your eyes and teeth checked.  Stay up to date on all vaccines. This information is not intended to replace advice given to you by your health care provider. Make sure you discuss any questions you have with your health care provider. Document Released: 12/31/2015 Document Revised: 11/28/2018 Document Reviewed: 11/28/2018 Elsevier Patient Education  2020 [employer].

## 2019-11-06 ENCOUNTER — Ambulatory Visit (INDEPENDENT_AMBULATORY_CARE_PROVIDER_SITE_OTHER): Payer: Medicare Other

## 2019-11-06 ENCOUNTER — Other Ambulatory Visit: Payer: Self-pay

## 2019-11-06 VITALS — BP 136/82 | HR 68

## 2019-11-06 DIAGNOSIS — I5022 Chronic systolic (congestive) heart failure: Secondary | ICD-10-CM | POA: Diagnosis not present

## 2019-11-06 DIAGNOSIS — I10 Essential (primary) hypertension: Secondary | ICD-10-CM | POA: Diagnosis not present

## 2019-11-06 DIAGNOSIS — Z20822 Contact with and (suspected) exposure to covid-19: Secondary | ICD-10-CM

## 2019-11-06 LAB — BASIC METABOLIC PANEL
BUN/Creatinine Ratio: 20 (ref 10–24)
BUN: 25 mg/dL (ref 8–27)
CO2: 21 mmol/L (ref 20–29)
Calcium: 8.9 mg/dL (ref 8.6–10.2)
Chloride: 97 mmol/L (ref 96–106)
Creatinine, Ser: 1.22 mg/dL (ref 0.76–1.27)
GFR calc Af Amer: 67 mL/min/{1.73_m2} (ref >59)
GFR calc non Af Amer: 58 mL/min/{1.73_m2} — ABNORMAL LOW (ref >59)
Glucose: 235 mg/dL — ABNORMAL HIGH (ref 65–99)
Potassium: 4.9 mmol/L (ref 3.5–5.2)
Sodium: 130 mmol/L — ABNORMAL LOW (ref 134–144)

## 2019-11-06 NOTE — Patient Instructions (Addendum)
Thank you for seeing [place] today!  Your blood pressure goal is < 130/80  Lab work today - we will call with the results.   If all the labs look ok, we will plan to increase your Entresto to 49/51 mg 1 tablet by mouth twice daily.   You can take your Entresto 24/26 mg strength as 2 tablets by mouth twice daily until you run out, and can then pick up the new prescription from the pharmacy.  Next visit with pharmacy clinic is on Friday 12/4.

## 2019-11-06 NOTE — Progress Notes (Signed)
Patient ID: Michael Steinborn Sr.                 DOB: February 28, 1946                      MRN: [id number]     HPI: Michael Cazarez Sr. is a 73 y.o. male referred by Dr. Lovena Le to pharmacy clinic for CHF medication optimization. PMH is significant for HFrEF (LVEF 30% in 2018), T2DM, IgG kappa smoldering multiple myeloma, HTN, HLD and heart block s/p PPM. He was last seen by Dr. Lovena Le on 09/03/19. At this visit his BP was 148/82 and he was continued on Hyzaar (losartan/HCTZ) and carvedilol. On 10/24, the Hyzaar was stopped and switched to losartan alone and continued on his carvedilol due to urinary frequency with the HCTZ. He was then switched from losartan to Entresto 24/26 mg BID on 11/5 per Dr. Lovena Le following reported home BP readings of 140-170/90s and given reduced LVEF.  Patient reports to clinic for initial visit. He reports doing well with Entresto. He has also brought his blood pressure log with him to the visit. AM blood pressures after taking Entresto seem slightly higher than evening blood pressure recordings. He did take his medications this morning and his BP in clinic was 136/82. He is ok with the cost of his Entresto.  Current HTN meds: Entresto 24/26 mg BID, carvedilol 37.5 mg BID  Previously tried: losartan-HCTZ 100/25 mg (urinary frequency), amlodipine (swelling)  BP goal: <130/40mHg  Family History: HTN, cancer, stroke in mother; HTN in sister; HTN in brother; HTN in maternal grandmother  Social History: former smoker (quit at age 73, no alcohol intake  Diet:  Breakfast: eggs, peppers, onions, cheese OR oatmeal Lunch: chicken pita wrap and fries, diet coke Dinner: pinto beans, brown rice, chopped steak once per month, lots of trout and grilled fish or roasted chicken Limited sweets - low calorie options  Snacks - potato chips or pretzels   Exercise: building maintenance 8 hours daily, aerobic/breathing exercises in the morning, used to walk daily at the mall  Home BP readings:   AM: 130-150s/80-90s (after Entresto) PM: 120-140s/80s  Wt Readings from Last 3 Encounters:  10/08/19 216 lb (98 kg)  10/02/19 214 lb (97.1 kg)  09/03/19 218 lb (98.9 kg)   BP Readings from Last 3 Encounters:  11/03/19 138/84  10/08/19 (!) 148/82  10/02/19 (!) 173/96   Pulse Readings from Last 3 Encounters:  10/08/19 66  10/02/19 60  09/03/19 (!) 56   Renal function: CrCl cannot be calculated (Patient's most recent lab result is older than the maximum 21 days allowed.).  Past Medical History:  Diagnosis Date  . Anemia   . Cataract   . Chronic systolic (congestive) heart failure ([place]   . Diabetes mellitus   . GERD (gastroesophageal reflux disease)   . Hyperlipidemia   . Hypertension   . Iron deficiency anemia due to chronic blood loss 10/02/2019  . MGUS (monoclonal gammopathy of unknown significance)   . [place] 02[place]2013   normal cytogenetics and FISH panel on 03/11/2013.   . Osteoarthritis   . Presence of permanent cardiac pacemaker   . Right bundle branch block   . Syncope     Current Outpatient Medications on File Prior to Visit  Medication Sig Dispense Refill  . albuterol (PROVENTIL HFA;VENTOLIN HFA) 108 (90 Base) MCG/ACT inhaler Inhale 2 puffs into the lungs every 6 (six) hours as needed for wheezing or shortness of breath.  1 Inhaler 2  . ASPIRIN 81 PO Take 81 mg by mouth daily.    Marland Kitchen atorvastatin (LIPITOR) 20 MG tablet TAKE 1 TABLET BY MOUTH DAILY. PLEASE KEEP UPCOMING APPT FOR FUTURE REFILLS. THANK YOU. 90 tablet 3  . carvedilol (COREG) 25 MG tablet Take 25 mg by mouth 2 (two) times daily with a meal. Take one tablet and a half, total of 37.5 mg twice a day.    . ferrous sulfate 325 (65 FE) MG tablet Take 325 mg by mouth every other day.    . fluticasone (FLONASE) 50 MCG/ACT nasal spray Place 2 sprays into both nostrils daily as needed for allergies or rhinitis. 16 g 5  . Ginkgo Biloba (GINKOBA) 40 MG TABS Take 1 tablet by mouth every other day.     Marland Kitchen glipiZIDE (GLUCOTROL XL) 5 MG 24 hr tablet Take 1 tablet (5 mg total) by mouth 2 (two) times daily. 180 tablet 1  . latanoprost (XALATAN) 0.005 % ophthalmic solution Place 1 drop into both eyes at bedtime.     . metFORMIN (GLUCOPHAGE) 1000 MG tablet Take 1 tablet (1,000 mg total) by mouth 2 (two) times daily with a meal. 180 tablet 1  . Multiple Vitamin (MULTIVITAMIN WITH MINERALS) TABS Take 1 tablet by mouth daily.     . Omega-3 Fatty Acids (FISH OIL BURP-LESS PO) Take 1 capsule by mouth every other day.     . sacubitril-valsartan (ENTRESTO) 24-26 MG Take 1 tablet by mouth 2 (two) times daily. Discontinue losartan when you start Entresto. 60 tablet 11   No current facility-administered medications on file prior to visit.     Allergies  Allergen Reactions  . Amlodipine Swelling  . Crestor [Rosuvastatin] Other (See Comments)    Per pt unable to focus    Assessment/Plan:  1. Hypertension/CHF - BP is above goal at 136/82 (goal < 130/80). BMET today - if stable, will increase Entresto to 49/51 mg BID (pt can take Entresto 24/26 as 2 tablets BID until he runs out). Will call patient once labs resulted to confirm instructions. Continue carvedilol 37.5 mg BID. Follow up in HTN clinic in 2 weeks with repeat BMET.  Vertis Kelch, PharmD PGY2 Cardiology Pharmacy Resident [facility] [address] [place], [place] [address] Phone: [phone]; Fax: [phone]

## 2019-11-07 ENCOUNTER — Telehealth: Payer: Self-pay

## 2019-11-07 DIAGNOSIS — I10 Essential (primary) hypertension: Secondary | ICD-10-CM

## 2019-11-07 MED ORDER — ENTRESTO 49-51 MG PO TABS: 1.0000 | ORAL_TABLET | Freq: Two times a day (BID) | ORAL | 3 refills | Status: DC

## 2019-11-07 NOTE — Telephone Encounter (Signed)
BMET results from 11/19 with stable K and SCr  Called pt and instructed to increase Entresto to 49/51 mg BID He can take his Entresto 24/26 mg as TWO tablets BID until that bottle is empty, then will pick up new Rx from the pharmacy.   Pt verbalized understanding  Follow up in HTN clinic in 2 weeks. BMET ordered.

## 2019-11-10 LAB — NOVEL CORONAVIRUS, NAA: SARS-CoV-2, NAA: NOT DETECTED

## 2019-11-21 ENCOUNTER — Ambulatory Visit (INDEPENDENT_AMBULATORY_CARE_PROVIDER_SITE_OTHER): Payer: Medicare Other | Admitting: Pharmacist

## 2019-11-21 ENCOUNTER — Other Ambulatory Visit: Payer: Self-pay

## 2019-11-21 VITALS — BP 153/80 | HR 54

## 2019-11-21 DIAGNOSIS — I10 Essential (primary) hypertension: Secondary | ICD-10-CM | POA: Diagnosis not present

## 2019-11-21 DIAGNOSIS — I5022 Chronic systolic (congestive) heart failure: Secondary | ICD-10-CM

## 2019-11-21 LAB — BASIC METABOLIC PANEL
BUN/Creatinine Ratio: 19 (ref 10–24)
BUN: 24 mg/dL (ref 8–27)
CO2: 22 mmol/L (ref 20–29)
Calcium: 9.4 mg/dL (ref 8.6–10.2)
Chloride: 98 mmol/L (ref 96–106)
Creatinine, Ser: 1.26 mg/dL (ref 0.76–1.27)
GFR calc Af Amer: 65 mL/min/{1.73_m2} (ref >59)
GFR calc non Af Amer: 56 mL/min/{1.73_m2} — ABNORMAL LOW (ref >59)
Glucose: 200 mg/dL — ABNORMAL HIGH (ref 65–99)
Potassium: 4.5 mmol/L (ref 3.5–5.2)
Sodium: 133 mmol/L — ABNORMAL LOW (ref 134–144)

## 2019-11-21 NOTE — Patient Instructions (Signed)
It was nice to see you today  Continue taking your current medications - we will check your lab work today and potentially increase the dose of your Entresto or start you on a new medicine called spironolactone  I'll give you a call when the labs are back either later this afternoon or on Monday  We'll schedule follow up at that time

## 2019-11-21 NOTE — Progress Notes (Signed)
Patient ID: Michael Alicea Sr.                 DOB: 04-11-1946                      MRN: [id number]     HPI: Michael Barco Sr. is a 73 y.o. male referred by Dr. Lovena Le to pharmacy clinic for CHF medication optimization. PMH is significant for HFrEF (LVEF 30% in 2018), T2DM, IgG kappa smoldering multiple myeloma, HTN, HLD and heart block s/p PPM. He was last seen by Dr. Lovena Le on 09/03/19. At this visit his BP was 148/82 and he was continued on Hyzaar (losartan/HCTZ) and carvedilol. On 10/24, the Hyzaar was stopped and switched to losartan alone and continued on his carvedilol due to urinary frequency with the HCTZ. He was then switched from losartan to Entresto 24/26 mg BID on 11/5 per Dr. Lovena Le following reported home BP readings of 140-170/90s and given reduced LVEF. Pt was seen in pharmacy clinic 2 weeks ago and Entresto dose was increased to 49-'51mg'$  BID.  Pt presents today in good spirits. He is tolerating higher dose of Entresto well. He brings a BP log with him today - BP readings are higher before he takes his meds 130-150s/80-90s vs after he takes his meds 110-130/70-80s. HR in the 60s at home. He took his AM meds close to 2 hours ago but has also had 4 cups of coffee this AM - he drinks Folgers 1/2 and 1/2. Does not eat foods high in K frequently. He is ok with the cost of his Entresto.  Current HTN meds: Entresto 49-51 mg BID, carvedilol 37.5 mg BID  Previously tried: losartan-HCTZ 100/25 mg (urinary frequency), amlodipine (swelling)  BP goal: <130/10mHg  Family History: HTN, cancer, stroke in mother; HTN in sister; HTN in brother; HTN in maternal grandmother  Social History: former smoker (quit at age 73, no alcohol intake  Diet:  Breakfast: eggs, peppers, onions, cheese OR oatmeal Lunch: chicken pita wrap and fries, diet coke Dinner: pinto beans, brown rice, chopped steak once per month, lots of trout and grilled fish or roasted chicken Limited sweets - low calorie options  Snacks  - potato chips or pretzels   Exercise: building maintenance 8 hours daily, aerobic/breathing exercises in the morning, used to walk daily at the mall  Home BP readings:  130-150/80-90 before meds, 110-130/70-80 after meds. HR 60s  Wt Readings from Last 3 Encounters:  10/08/19 216 lb (98 kg)  10/02/19 214 lb (97.1 kg)  09/03/19 218 lb (98.9 kg)   BP Readings from Last 3 Encounters:  11/06/19 136/82  11/03/19 138/84  10/08/19 (!) 148/82   Pulse Readings from Last 3 Encounters:  11/06/19 68  10/08/19 66  10/02/19 60   Renal function: CrCl cannot be calculated (Unknown ideal weight.).  Past Medical History:  Diagnosis Date  . Anemia   . Cataract   . Chronic systolic (congestive) heart failure ([facility]   . Diabetes mellitus   . GERD (gastroesophageal reflux disease)   . Hyperlipidemia   . Hypertension   . Iron deficiency anemia due to chronic blood loss 10/02/2019  . MGUS (monoclonal gammopathy of unknown significance)   . Multiple myeloma ([facility] 02/2013   normal cytogenetics and FISH panel on 03/11/2013.   . Osteoarthritis   . Presence of permanent cardiac pacemaker   . Right bundle branch block   . Syncope     Current Outpatient Medications on File Prior to Visit  Medication Sig Dispense Refill  . albuterol (PROVENTIL HFA;VENTOLIN HFA) 108 (90 Base) MCG/ACT inhaler Inhale 2 puffs into the lungs every 6 (six) hours as needed for wheezing or shortness of breath. 1 Inhaler 2  . ASPIRIN 81 PO Take 81 mg by mouth daily.    Marland Kitchen atorvastatin (LIPITOR) 20 MG tablet TAKE 1 TABLET BY MOUTH DAILY. PLEASE KEEP UPCOMING APPT FOR FUTURE REFILLS. THANK YOU. 90 tablet 3  . carvedilol (COREG) 25 MG tablet Take 25 mg by mouth 2 (two) times daily with a meal. Take one tablet and a half, total of 37.5 mg twice a day.    . ferrous sulfate 325 (65 FE) MG tablet Take 325 mg by mouth every other day.    . fluticasone (FLONASE) 50 MCG/ACT nasal spray Place 2 sprays into both nostrils daily as needed  for allergies or rhinitis. 16 g 5  . Ginkgo Biloba (GINKOBA) 40 MG TABS Take 1 tablet by mouth every other day.    Marland Kitchen glipiZIDE (GLUCOTROL XL) 5 MG 24 hr tablet Take 1 tablet (5 mg total) by mouth 2 (two) times daily. 180 tablet 1  . latanoprost (XALATAN) 0.005 % ophthalmic solution Place 1 drop into both eyes at bedtime.     . metFORMIN (GLUCOPHAGE) 1000 MG tablet Take 1 tablet (1,000 mg total) by mouth 2 (two) times daily with a meal. 180 tablet 1  . Multiple Vitamin (MULTIVITAMIN WITH MINERALS) TABS Take 1 tablet by mouth daily.     . Omega-3 Fatty Acids (FISH OIL BURP-LESS PO) Take 1 capsule by mouth every other day.     . sacubitril-valsartan (ENTRESTO) 49-51 MG Take 1 tablet by mouth 2 (two) times daily. 60 tablet 3   No current facility-administered medications on file prior to visit.     Allergies  Allergen Reactions  . Amlodipine Swelling  . Crestor [Rosuvastatin] Other (See Comments)    Per pt unable to focus    Assessment/Plan:  1. Hypertension/CHF - BP running higher in clinic today than at home, > 50% of home readings are also above goal. Have room to optimize CHF regimen. Will check BMET today on higher dose of Entresto. If stable, can increase Entresto to 97-'103mg'$  BID or start spironolactone 12.'5mg'$  daily. Will plan f/u visit with pt once labs result.  Megan E. Supple, PharmD, BCACP, [facility] [address], [place], [place] [address] Phone: [phone]; Fax: [phone] 11/21/2019 7:29 AM

## 2019-11-24 ENCOUNTER — Telehealth: Payer: Self-pay | Admitting: Pharmacist

## 2019-11-24 DIAGNOSIS — I5022 Chronic systolic (congestive) heart failure: Secondary | ICD-10-CM

## 2019-11-24 MED ORDER — SPIRONOLACTONE 25 MG PO TABS: 12.5000 mg | ORAL_TABLET | Freq: Every day | ORAL | 3 refills | Status: DC

## 2019-11-24 NOTE — Telephone Encounter (Signed)
Patient returned call. He is agreeable to starting spironolactone. He is a little hesitant about urinary frequency at night. I have advised him to take first thing in the AM and to continue to limit his caffeine intake. Will follow up in clinic on 12/17 on get a BMP at this visit. Spironolatone 12.5mg  daily sent to [facility] on [address] per pt request

## 2019-11-24 NOTE — Telephone Encounter (Signed)
Left message for pt to discuss labs - BMET stable. Will plan to start spironolactone 12.5mg  daily for HF benefit and schedule f/u in pharmacy clinic in 10 days for BMET and BP check.

## 2019-12-03 ENCOUNTER — Encounter: Payer: Self-pay | Admitting: Family Medicine

## 2019-12-04 ENCOUNTER — Other Ambulatory Visit: Payer: Self-pay

## 2019-12-04 ENCOUNTER — Other Ambulatory Visit: Payer: Medicare Other | Admitting: *Deleted

## 2019-12-04 ENCOUNTER — Ambulatory Visit (INDEPENDENT_AMBULATORY_CARE_PROVIDER_SITE_OTHER): Payer: Medicare Other | Admitting: Pharmacist

## 2019-12-04 ENCOUNTER — Encounter: Payer: Self-pay | Admitting: Pharmacist

## 2019-12-04 VITALS — BP 130/72 | HR 61

## 2019-12-04 DIAGNOSIS — I5022 Chronic systolic (congestive) heart failure: Secondary | ICD-10-CM

## 2019-12-04 LAB — BASIC METABOLIC PANEL
BUN/Creatinine Ratio: 20 (ref 10–24)
BUN: 24 mg/dL (ref 8–27)
CO2: 22 mmol/L (ref 20–29)
Calcium: 9.2 mg/dL (ref 8.6–10.2)
Chloride: 99 mmol/L (ref 96–106)
Creatinine, Ser: 1.23 mg/dL (ref 0.76–1.27)
GFR calc Af Amer: 66 mL/min/{1.73_m2} (ref >59)
GFR calc non Af Amer: 57 mL/min/{1.73_m2} — ABNORMAL LOW (ref >59)
Glucose: 154 mg/dL — ABNORMAL HIGH (ref 65–99)
Potassium: 4.7 mmol/L (ref 3.5–5.2)
Sodium: 137 mmol/L (ref 134–144)

## 2019-12-04 MED ORDER — GLUCOSE BLOOD VI STRP: ORAL_STRIP | 12 refills | Status: DC

## 2019-12-04 NOTE — Patient Instructions (Addendum)
It was a pleasure to meet you today!  We will call you tomorrow with your lab results. As long as your labs are stable we will plan to increase spironolactone to 25mg  daily (1 whole tablet).  Continue checking your blood pressure at home. Please bring in your log and meter with you to your next appointment.  Call us at [phone] with any questions or concerns.

## 2019-12-04 NOTE — Progress Notes (Signed)
Patient ID: Michael Ta Sr.                 DOB: [birth year]                      MRN: [id number]     HPI: Michael Urwin Sr. is a 73 y.o. male referred by Dr. Lovena Le to pharmacy clinic for CHF medication optimization. PMH is significant for HFrEF (LVEF 30% in 2018), T2DM, IgG kappa smoldering multiple myeloma, HTN, HLD and heart block s/p PPM. He was last seen by Dr. Lovena Le on 09/03/19. At this visit his BP was 148/82 and he was continued on Hyzaar (losartan/HCTZ) and carvedilol. On 10/24, the Hyzaar was stopped and switched to losartan alone and continued on his carvedilol due to urinary frequency with the HCTZ. He was then switched from losartan to Entresto 24/26 mg BID on 11/5 per Dr. Lovena Le following reported home BP readings of 140-170/90s and given reduced LVEF. Pt was seen in pharmacy clinic 4 weeks ago and Entresto dose was increased to 49-20m BID.   At last visit to pharmacy clinic patient blood pressure was still above goal. Spironolactone 12.58mdaily was started.   Patient presents today for follow up. He states that he has only taken spironolactone for 3 days because it was very hard for him to cut in half. Tablet was small and would crumble. He denies headache, blurred vision or swelling. He states he gets slightly lightheaded when he stands up from kneeling down. He also gets a little shortness of breath when he exerts himself. Does not last long. He has cut back some on his caffeine. Blood pressure in clinic was 142/78 then 130/72 on repeat. He reports his blood pressure has not changed much. Still high in the AM before meds and then in the 120's/70-80 in the afternoon. He is still very active.  He did not take spironolactone today. He has had BMP drawn prior to visit. Results pending.   Current HTN meds: Entresto 49-51 mg BID, carvedilol 3.75 mg BID, spironolactone 12.65m67maily  Previously tried: losartan-HCTZ 100/25 mg (urinary frequency), amlodipine (swelling)  BP goal:  <130/73m19m Family History: HTN, cancer, stroke in mother; HTN in sister; HTN in brother; HTN in maternal grandmother  Social History: former smoker (quit at age 77),12o alcohol intake  Diet:  Breakfast: eggs, peppers, onions, cheese OR oatmeal Lunch: chicken pita wrap and fries, diet coke Dinner: pinto beans, brown rice, chopped steak once per month, lots of trout and grilled fish or roasted chicken Limited sweets - low calorie options  Snacks - potato chips or pretzels   Exercise: building maintenance 8 hours daily, aerobic/breathing exercises in the morning, used to walk daily at the mall  Home BP readings:  Lunch: 119/78 (120's/ low 80's-70's) 159-160/90's first thing in the AM- before meds  Wt Readings from Last 3 Encounters:  10/08/19 216 lb (98 kg)  10/02/19 214 lb (97.1 kg)  09/03/19 218 lb (98.9 kg)   BP Readings from Last 3 Encounters:  11/21/19 (!) 153/80  11/06/19 136/82  11/03/19 138/84   Pulse Readings from Last 3 Encounters:  11/21/19 (!) 54  11/06/19 68  10/08/19 66   Renal function: CrCl cannot be calculated (Unknown ideal weight.).  Past Medical History:  Diagnosis Date  . Anemia   . Cataract   . Chronic systolic (congestive) heart failure (HCC)[place]. Diabetes mellitus   . GERD (gastroesophageal reflux disease)   . Hyperlipidemia   .  Hypertension   . Iron deficiency anemia due to chronic blood loss 10/02/2019  . MGUS (monoclonal gammopathy of unknown significance)   . Multiple myeloma (HCC) 02/2013   normal cytogenetics and FISH panel on 03/11/2013.   . Osteoarthritis   . Presence of permanent cardiac pacemaker   . Right bundle branch block   . Syncope     Current Outpatient Medications on File Prior to Visit  Medication Sig Dispense Refill  . albuterol (PROVENTIL HFA;VENTOLIN HFA) 108 (90 Base) MCG/ACT inhaler Inhale 2 puffs into the lungs every 6 (six) hours as needed for wheezing or shortness of breath. 1 Inhaler 2  . ASPIRIN 81 PO Take 81  mg by mouth daily.    . atorvastatin (LIPITOR) 20 MG tablet TAKE 1 TABLET BY MOUTH DAILY. PLEASE KEEP UPCOMING APPT FOR FUTURE REFILLS. THANK YOU. 90 tablet 3  . carvedilol (COREG) 25 MG tablet Take 25 mg by mouth 2 (two) times daily with a meal. Take one tablet and a half, total of 37.5 mg twice a day.    . ferrous sulfate 325 (65 FE) MG tablet Take 325 mg by mouth every other day.    . fluticasone (FLONASE) 50 MCG/ACT nasal spray Place 2 sprays into both nostrils daily as needed for allergies or rhinitis. 16 g 5  . Ginkgo Biloba (GINKOBA) 40 MG TABS Take 1 tablet by mouth every other day.    . glipiZIDE (GLUCOTROL XL) 5 MG 24 hr tablet Take 1 tablet (5 mg total) by mouth 2 (two) times daily. 180 tablet 1  . latanoprost (XALATAN) 0.005 % ophthalmic solution Place 1 drop into both eyes at bedtime.     . metFORMIN (GLUCOPHAGE) 1000 MG tablet Take 1 tablet (1,000 mg total) by mouth 2 (two) times daily with a meal. 180 tablet 1  . Multiple Vitamin (MULTIVITAMIN WITH MINERALS) TABS Take 1 tablet by mouth daily.     . Omega-3 Fatty Acids (FISH OIL BURP-LESS PO) Take 1 capsule by mouth every other day.     . sacubitril-valsartan (ENTRESTO) 49-51 MG Take 1 tablet by mouth 2 (two) times daily. 60 tablet 3  . spironolactone (ALDACTONE) 25 MG tablet Take 0.5 tablets (12.5 mg total) by mouth daily. 90 tablet 3   No current facility-administered medications on file prior to visit.    Allergies  Allergen Reactions  . Amlodipine Swelling  . Crestor [Rosuvastatin] Other (See Comments)    Per pt unable to focus    Assessment/Plan:  1. Hypertension/CHF - Blood pressure is close to goal of <130/80 in clinic today. However he still has blood pressure room to optimize his heart failure medication regimen. We discussed the importance of carvedilol, Entresto and spironolactone on helping his heart work more efficiently, keeping him out of the hospital and helping him live longer. Since patient is having a hard  time cutting spironolactone in half, will plan to increase to 25mg daily as long as his scr and K are stable. Will call patient tomorrow when labs result to go over final plan. May be able to increase Entresto to max dose at next visit if his dizziness improves and there is still blood pressure room.  Thank you,  Melissa D Maccia, Pharm.D, BCPS, CPP [facility]  [address], [place], [place] [address]  Phone: [phone]; Fax: [phone]   12/04/2019 7:21 AM     

## 2019-12-05 ENCOUNTER — Telehealth: Payer: Self-pay | Admitting: Pharmacist

## 2019-12-05 DIAGNOSIS — I5022 Chronic systolic (congestive) heart failure: Secondary | ICD-10-CM

## 2019-12-05 NOTE — Telephone Encounter (Signed)
BMET drawn yesterday is stable. Called pt and advised him to increase his spironolactone from 12.5mg  to 25mg  daily. He will keep f/u appt in 2 weeks for further CHF medication management.

## 2019-12-08 DIAGNOSIS — H34811 Central retinal vein occlusion, right eye, with macular edema: Secondary | ICD-10-CM | POA: Diagnosis not present

## 2019-12-08 DIAGNOSIS — H40013 Open angle with borderline findings, low risk, bilateral: Secondary | ICD-10-CM | POA: Diagnosis not present

## 2019-12-10 DIAGNOSIS — H34811 Central retinal vein occlusion, right eye, with macular edema: Secondary | ICD-10-CM | POA: Diagnosis not present

## 2019-12-23 ENCOUNTER — Ambulatory Visit (INDEPENDENT_AMBULATORY_CARE_PROVIDER_SITE_OTHER): Payer: Medicare Other | Admitting: Pharmacist

## 2019-12-23 ENCOUNTER — Other Ambulatory Visit: Payer: Self-pay

## 2019-12-23 ENCOUNTER — Encounter: Payer: Self-pay | Admitting: Pharmacist

## 2019-12-23 ENCOUNTER — Other Ambulatory Visit: Payer: Medicare Other

## 2019-12-23 VITALS — BP 144/80 | HR 60

## 2019-12-23 DIAGNOSIS — I5022 Chronic systolic (congestive) heart failure: Secondary | ICD-10-CM

## 2019-12-23 DIAGNOSIS — I10 Essential (primary) hypertension: Secondary | ICD-10-CM | POA: Diagnosis not present

## 2019-12-23 LAB — BASIC METABOLIC PANEL
BUN/Creatinine Ratio: 16 (ref 10–24)
BUN: 21 mg/dL (ref 8–27)
CO2: 24 mmol/L (ref 20–29)
Calcium: 9.2 mg/dL (ref 8.6–10.2)
Chloride: 101 mmol/L (ref 96–106)
Creatinine, Ser: 1.32 mg/dL — ABNORMAL HIGH (ref 0.76–1.27)
GFR calc Af Amer: 61 mL/min/{1.73_m2} (ref >59)
GFR calc non Af Amer: 53 mL/min/{1.73_m2} — ABNORMAL LOW (ref >59)
Glucose: 178 mg/dL — ABNORMAL HIGH (ref 65–99)
Potassium: 4.5 mmol/L (ref 3.5–5.2)
Sodium: 136 mmol/L (ref 134–144)

## 2019-12-23 NOTE — Patient Instructions (Addendum)
It was a pleasure to see you again.  We will call you tomorrow with your blood work results. We will plan to increase Entresto to 97-103 mg twice a day as long as everything looks good.  Continue carvedilol 37.5 mg BID and spironolactone 25mg  daily.  Please call us at [phone] with any questions or concerns.

## 2019-12-23 NOTE — Progress Notes (Signed)
Patient ID: Michael Sorbo Sr.                 DOB: 1946-02-17                      MRN: [id number]     HPI: Michael Spurgeon Sr. is a 74 y.o. male referred by Dr. Lovena Michael Wiggins to pharmacy clinic for CHF medication optimization. PMH is significant for HFrEF (LVEF 30% in 2018), T2DM, IgG kappa smoldering multiple myeloma, HTN, HLD and heart block s/p PPM. He was last seen by Dr. Lovena Michael Wiggins on 09/03/19. At this visit his BP was 148/82 and he was continued on Hyzaar (losartan/HCTZ) and carvedilol. On 10/24, the Hyzaar was stopped and switched to losartan alone and continued on his carvedilol due to urinary frequency with the HCTZ. He was then switched from losartan to Entresto 24/26 mg BID on 11/5 per Dr. Lovena Michael Wiggins following reported home BP readings of 140-170/90s and given reduced LVEF. Pt was seen in pharmacy clinic previous and Entresto dose was increased to 49-63m BID.   At last visit to pharmacy clinic patient blood pressure was still above goal. Spironolactone was increased to 272mdaily. We discussed the importance of carvedilol, Entresto and spironolactone on helping his heart work more efficiently, keeping him out of the hospital and helping him live longer. Discussed the hopes would be to increase Entresto to the highest dose at next appointment if blood pressure allows and BMP stable. Heart rate prevents increasing carvedilol.  Patient presents today for follow up. He denies dizziness, lightheadedness, headache, blurred vision, SOB, swelling (states he has little at night- legs skinny in the AM). Patient complains of some mild joint aches. States his blood pressure is high in the AM although he is checking prior to taking his medications. He brought his home blood pressure cuff. Home cuff reading was 147/95, clinic 144/80. When patient placed cuff, cuff was too loose and I tightened for patient. States his blood pressure is lower in the afternoons.   Current HTN meds: Entresto 49-51 mg BID, carvedilol 37.5 mg BID,  spironolactone 2546maily  Previously tried: losartan-HCTZ 100/25 mg (urinary frequency), amlodipine (swelling)  BP goal: <130/34m3m Family History: HTN, cancer, stroke in mother; HTN in sister; HTN in brother; HTN in maternal grandmother  Social History: former smoker (quit at age 82),66o alcohol intake  Diet:  Breakfast: eggs, peppers, onions, cheese OR oatmeal Lunch: chicken pita wrap and fries, diet coke Dinner: pinto beans, brown rice, chopped steak once per month, lots of trout and grilled fish or roasted chicken Limited sweets - low calorie options  Snacks - potato chips or pretzels   Exercise: building maintenance 8 hours daily, aerobic/breathing exercises in the morning, used to walk daily at the mall  Home BP readings: 157/94, 147/90 this AM (before meds- 1 hr after Entresto) Afternoon: at or below goal per patient  Wt Readings from Last 3 Encounters:  10/08/19 216 lb (98 kg)  10/02/19 214 lb (97.1 kg)  09/03/19 218 lb (98.9 kg)   BP Readings from Last 3 Encounters:  12/04/19 130/72  11/21/19 (!) 153/80  11/06/19 136/82   Pulse Readings from Last 3 Encounters:  12/04/19 61  11/21/19 (!) 54  11/06/19 68   Renal function: CrCl cannot be calculated (Unknown ideal weight.).  Past Medical History:  Diagnosis Date  . Anemia   . Cataract   . Chronic systolic (congestive) heart failure (HCC)[place]. Diabetes mellitus   .  GERD (gastroesophageal reflux disease)   . Hyperlipidemia   . Hypertension   . Iron deficiency anemia due to chronic blood loss 10/02/2019  . MGUS (monoclonal gammopathy of unknown significance)   . Multiple myeloma ([place]) 02/2013   normal cytogenetics and FISH panel on 03/11/2013.   . Osteoarthritis   . Presence of permanent cardiac pacemaker   . Right bundle branch block   . Syncope     Current Outpatient Medications on File Prior to Visit  Medication Sig Dispense Refill  . albuterol (PROVENTIL HFA;VENTOLIN HFA) 108 (90 Base) MCG/ACT  inhaler Inhale 2 puffs into the lungs every 6 (six) hours as needed for wheezing or shortness of breath. 1 Inhaler 2  . ASPIRIN 81 PO Take 81 mg by mouth daily.    Marland Kitchen atorvastatin (LIPITOR) 20 MG tablet TAKE 1 TABLET BY MOUTH DAILY. PLEASE KEEP UPCOMING APPT FOR FUTURE REFILLS. THANK YOU. 90 tablet 3  . carvedilol (COREG) 25 MG tablet Take 25 mg by mouth 2 (two) times daily with a meal. Take one tablet and a half, total of 37.5 mg twice a day.    . ferrous sulfate 325 (65 FE) MG tablet Take 325 mg by mouth every other day.    . fluticasone (FLONASE) 50 MCG/ACT nasal spray Place 2 sprays into both nostrils daily as needed for allergies or rhinitis. 16 g 5  . Ginkgo Biloba (GINKOBA) 40 MG TABS Take 1 tablet by mouth every other day.    Marland Kitchen glipiZIDE (GLUCOTROL XL) 5 MG 24 hr tablet Take 1 tablet (5 mg total) by mouth 2 (two) times daily. 180 tablet 1  . glucose blood test strip Use as instructed 100 each 12  . latanoprost (XALATAN) 0.005 % ophthalmic solution Place 1 drop into both eyes at bedtime.     . metFORMIN (GLUCOPHAGE) 1000 MG tablet Take 1 tablet (1,000 mg total) by mouth 2 (two) times daily with a meal. 180 tablet 1  . Multiple Vitamin (MULTIVITAMIN WITH MINERALS) TABS Take 1 tablet by mouth daily.     . Omega-3 Fatty Acids (FISH OIL BURP-LESS PO) Take 1 capsule by mouth every other day.     . sacubitril-valsartan (ENTRESTO) 49-51 MG Take 1 tablet by mouth 2 (two) times daily. 60 tablet 3  . spironolactone (ALDACTONE) 25 MG tablet Take 1 tablet (25 mg total) by mouth daily. 90 tablet 3   No current facility-administered medications on file prior to visit.    Allergies  Allergen Reactions  . Amlodipine Swelling  . Crestor [Rosuvastatin] Other (See Comments)    Per pt unable to focus    Assessment/Plan:  1. Hypertension/CHF - Blood pressure is above goal of <130/80 in clinic today. Patients home blood pressure cuff appears accurate for systolic, reads slightly high for diastolic.  Patient was educated on proper cuff fitting. Will plan to increase Entresto to 97-103 twice a day as long as BMP drawn today is stable. Will call patient tomorrow with the final plan. Continue carvedilol 37.44m twice a day and spironolactone 229mdaily. Follow up in 2 weeks.  Thank you,  MeRamond DialPharm.D, BCPS, CPP [id number]. [address] StreetGr[place]rem[place] [id number]: [phone]: [phone] 12/23/2019 7:07 AM

## 2019-12-24 ENCOUNTER — Telehealth: Payer: Self-pay | Admitting: Pharmacist

## 2019-12-24 MED ORDER — ENTRESTO 97-103 MG PO TABS: 1.0000 | ORAL_TABLET | Freq: Two times a day (BID) | ORAL | 11 refills | Status: DC

## 2019-12-24 NOTE — Telephone Encounter (Signed)
BMET overall stable - SCr slightly increased from 1.23 to 1.32, SCr has ranged 1.1-1.3 over the past few years. Ok to increase Entresto 97-103mg  BID, pt is aware of dose change. Also advised pt to stay hydrated with water. He was initially hesitant to the dose change, stating that he is "not a medicine person" and prefers to work on exercise. Advised him that he should exercise, but increasing his Entresto dose will help to keep him out of the hospital and keep him alive longer. He verbalized understanding and is in agreement with treatment plan. Will keep follow up visit in 2 weeks.

## 2019-12-30 LAB — CUP PACEART INCLINIC DEVICE CHECK
Date Time Interrogation Session: 20200916100505
Implantable Lead Implant Date: 20160822
Implantable Lead Implant Date: 20160822
Implantable Lead Implant Date: 20190118
Implantable Lead Location: 753858
Implantable Lead Location: 753859
Implantable Lead Location: 753860
Implantable Pulse Generator Implant Date: 20190118
Pulse Gen Model: 3262
Pulse Gen Serial Number: 8983138

## 2020-01-02 ENCOUNTER — Other Ambulatory Visit: Payer: Self-pay

## 2020-01-03 ENCOUNTER — Other Ambulatory Visit: Payer: Self-pay | Admitting: Family Medicine

## 2020-01-04 NOTE — Progress Notes (Addendum)
[facility] [place] [address], [address] [place], [place] [address] [phone] Fax [phone]  Date:  01/05/2020   Name:  Michael Faas Sr.   DOB:  03/08/1946   MRN:  [id number]  PCP:  [name], Jessica C, MD    Chief Complaint: Diabetes (3 month follow up), Covid Vaccine  (with hx of cancer-ok to take vaccine? ), and Blood Clot (behind right eye seeing Dr. Shah)   History of Present Illness:  Michael Priego Sr. is a 73 y.o. very pleasant male patient who presents with the following:  Here today for a periodic follow-up visit History of diabetes, CHF, IgA kappa smoldering with myeloma, hypertension, hyperlipidemia, heart block with pacemaker Last seen by myself in October His oncologist is Dr. Ennever Nephrologist is Dr. Webb  He is now seeing Dr Shah, opthalmology on the recommendation of his optometrist He is being treated with "shots in my eye" for a blood clot- he had his first treatment about a month ago, he will do one every 5 weeks, next scheduled for this week.  He notes that his vision is already improved Seen by cardiology 1 month ago, at that point they increased his spironolactone to 25 mg  He is planning to have his covid vaccine dose #1 tomorrow  He does eat some sugar free chocolates in the evening His am glucose tends to be 120 If he eats dinner but does not have any snack, his glucose tends to be a little higher- perhaps 150 fasting in the morning He is watching his carbs in general, he is still very active  Eye exam- up to date  He is due for foot exam Urine microalbumin  Lab Results  Component Value Date   HGBA1C 7.4 (H) 10/08/2019   Albuterol as needed Aspirin Lipitor 20 Carvedilol 37.5 twice daily Glipizide XL 5 Metformin Entresto Aldactone Patient Active Problem List   Diagnosis Date Noted  . Chronic kidney disease (CKD), active medical management without dialysis 10/09/2019  . Iron deficiency anemia due to  chronic blood loss 10/02/2019  . Chronic systolic (congestive) heart failure (HCC) 01/04/2018  . Pacemaker 12/08/2015  . Mobitz type 2 second degree heart block 08/08/2015  . Right bundle branch block 12/16/2014  . Syncope 12/16/2014  . Proteinuria due to type 2 diabetes mellitus (HCC) 11/12/2014  . Encounter for therapeutic drug monitoring 03/13/2013  . Multiple myeloma (HCC) 02/15/2013  . Edema 01/07/2013  . Diabetes mellitus due to underlying condition with other diabetic kidney complication (HCC)   . Hyperlipidemia   . Hypertension   . MGUS (monoclonal gammopathy of unknown significance)   . GERD (gastroesophageal reflux disease)     Past Medical History:  Diagnosis Date  . Anemia   . Cataract   . Chronic systolic (congestive) heart failure (HCC)   . Diabetes mellitus   . GERD (gastroesophageal reflux disease)   . Hyperlipidemia   . Hypertension   . Iron deficiency anemia due to chronic blood loss 10/02/2019  . MGUS (monoclonal gammopathy of unknown significance)   . Multiple myeloma (HCC) 02/2013   normal cytogenetics and FISH panel on 03/11/2013.   . Osteoarthritis   . Presence of permanent cardiac pacemaker   . Right bundle branch block   . Syncope     Past Surgical History:  Procedure Laterality Date  . BIV UPGRADE  01/04/2018  . BIV UPGRADE N/A 01/04/2018   Procedure: BIVP UPGRADE;  Surgeon: Taylor, Gregg W, MD;    Location: [facility];  Service: Cardiovascular;  Laterality: N/A;  . EP IMPLANTABLE DEVICE N/A 08/09/2015   Procedure: Pacemaker Implant;  Surgeon: [name], MD;  Location: [facility];  Service: Cardiovascular;  Laterality: N/A;  . EYE SURGERY      Social History   Tobacco Use  . Smoking status: Former Smoker    Packs/day: 2.00    Years: 35.00    Pack years: 70.00    Types: Cigarettes    Start date: 03/26/1963    Quit date: 12/18/1997    Years since quitting: 22.0  . Smokeless tobacco: Never Used  . Tobacco comment: quit 15  years  Substance Use Topics  . Alcohol use: No    Alcohol/week: 0.0 standard drinks  . Drug use: No    Family History  Problem Relation Age of Onset  . Hypertension Mother   . Cancer Mother   . Stroke Mother   . Hypertension Sister   . Hypertension Brother   . Hypertension Maternal Grandmother     Allergies  Allergen Reactions  . Amlodipine Swelling  . Crestor [Rosuvastatin] Other (See Comments)    Per pt unable to focus    Medication list has been reviewed and updated.  Current Outpatient Medications on File Prior to Visit  Medication Sig Dispense Refill  . albuterol (PROVENTIL HFA;VENTOLIN HFA) 108 (90 Base) MCG/ACT inhaler Inhale 2 puffs into the lungs every 6 (six) hours as needed for wheezing or shortness of breath. 1 Inhaler 2  . ASPIRIN 81 PO Take 81 mg by mouth daily.    Marland Kitchen atorvastatin (LIPITOR) 20 MG tablet TAKE 1 TABLET BY MOUTH DAILY. PLEASE KEEP UPCOMING APPT FOR FUTURE REFILLS. THANK YOU. 90 tablet 3  . carvedilol (COREG) 25 MG tablet Take 37.5 mg by mouth 2 (two) times daily with a meal. Take one tablet and a half, total of 37.5 mg twice a day.     . ferrous sulfate 325 (65 FE) MG tablet Take 325 mg by mouth every other day.    . fluticasone (FLONASE) 50 MCG/ACT nasal spray Place 2 sprays into both nostrils daily as needed for allergies or rhinitis. 16 g 5  . Ginkgo Biloba (GINKOBA) 40 MG TABS Take 1 tablet by mouth every other day.    Marland Kitchen glipiZIDE (GLUCOTROL XL) 5 MG 24 hr tablet TAKE 1 TABLET BY MOUTH TWICE A DAY 180 tablet 1  . glucose blood test strip Use as instructed 100 each 12  . latanoprost (XALATAN) 0.005 % ophthalmic solution Place 1 drop into both eyes at bedtime.     . metFORMIN (GLUCOPHAGE) 1000 MG tablet TAKE 1 TABLET (1,000 MG TOTAL) BY MOUTH 2 (TWO) TIMES DAILY WITH A MEAL. 180 tablet 1  . Multiple Vitamin (MULTIVITAMIN WITH MINERALS) TABS Take 1 tablet by mouth daily.     . Omega-3 Fatty Acids (FISH OIL BURP-LESS PO) Take 1 capsule by mouth every  other day.     . sacubitril-valsartan (ENTRESTO) 97-103 MG Take 1 tablet by mouth 2 (two) times daily. 60 tablet 11  . spironolactone (ALDACTONE) 25 MG tablet Take 1 tablet (25 mg total) by mouth daily. 90 tablet 3   No current facility-administered medications on file prior to visit.    Review of Systems:  As per HPI- otherwise negative. No fever chills  Physical Examination: Vitals:   01/05/20 1125  BP: 124/84  Pulse: 64  Resp: 16  Temp: (!) 97.1 F (36.2 C)  SpO2: 99%  Vitals:   01/05/20 1125  Weight: 213 lb (96.6 kg)  Height: 6' 3" (1.905 m)   Body mass index is 26.62 kg/m. Ideal Body Weight: Weight in (lb) to have BMI = 25: 199.6  GEN: WDWN, NAD, Non-toxic, A & O x 3, tall build, looks well HEENT: Atraumatic, Normocephalic. Neck supple. No masses, No LAD. Ears and Nose: No external deformity. CV: RRR, No M/G/R. No JVD. No thrill. No extra heart sounds. PULM: CTA B, no wheezes, crackles, rhonchi. No retractions. No resp. distress. No accessory muscle use. ABD: S, NT, ND, +BS. No rebound. No HSM. EXTR: No c/c/e NEURO Normal gait.  PSYCH: Normally interactive. Conversant. Not depressed or anxious appearing.  Calm demeanor.  Foot exam today-toenails are long and thickened, difficult for patient to manage on his own Otherwise normal foot exam  Assessment and Plan: Diabetes mellitus due to underlying condition with other diabetic kidney complication ([place]) - Plan: Microalbumin / creatinine urine ratio, Hemoglobin A1c  Toenail deformity  Essential hypertension  MGUS (monoclonal gammopathy of unknown significance)  Here today for follow-up visit A1c pending to evaluate blood sugar.  Michael Wiggins is concerned that his blood sugar may sometimes run at 150, I counseled him that given his age this is actually okay.  His glucose may drop as low as 70 sometimes in the afternoon  Discussed his blood pressure medication and Entresto.  He is still working on titrating up to 1 full  tablet twice a day, will meet with cardiology pharmacist next week  MGUS is managed by hematology, I counseled him that per my knowledge this is not a contraindication to the Covid vaccine  His toenails are thick and difficult to trim.  I encouraged him to consult with the podiatrist for help in maintaining his nails.  He plans to do so  Will plan further follow- up pending labs. Assuming labs okay, plan to follow-up in 4 to 6 months  Moderate medical decision making today This visit occurred during the SARS-CoV-2 public health emergency.  Safety protocols were in place, including screening questions prior to the visit, additional usage of staff PPE, and extensive cleaning of exam room while observing appropriate contact time as indicated for disinfecting solutions.    Signed Lamar Blinks, MD  Received his labs as below, message to patient  Results for orders placed or performed in visit on 01/05/20  Microalbumin / creatinine urine ratio  Result Value Ref Range   Microalb, Ur 32.5 (H) 0.0 - 1.9 mg/dL   Creatinine,U 44.6 mg/dL   Microalb Creat Ratio 72.9 (H) 0.0 - 30.0 mg/g  Hemoglobin A1c  Result Value Ref Range   Hgb A1c MFr Bld 7.7 (H) 4.6 - 6.5 %

## 2020-01-04 NOTE — Patient Instructions (Addendum)
It was good to see you again today, I will be in touch with your labs as soon as possible   I recommend you get the shingles vaccine at your earliest convenience-at your pharmacy I am glad you are getting your covid vaccine tomorrow!  Great news Please consult with a podiatrist for help in managing your toenails  Take care!

## 2020-01-05 ENCOUNTER — Other Ambulatory Visit: Payer: Self-pay

## 2020-01-05 ENCOUNTER — Ambulatory Visit (INDEPENDENT_AMBULATORY_CARE_PROVIDER_SITE_OTHER): Payer: Medicare Other | Admitting: Family Medicine

## 2020-01-05 ENCOUNTER — Ambulatory Visit (INDEPENDENT_AMBULATORY_CARE_PROVIDER_SITE_OTHER): Payer: Medicare Other | Admitting: *Deleted

## 2020-01-05 ENCOUNTER — Encounter: Payer: Self-pay | Admitting: Family Medicine

## 2020-01-05 VITALS — BP 124/84 | HR 64 | Temp 97.1°F | Resp 16 | Ht 75.0 in | Wt 213.0 lb

## 2020-01-05 DIAGNOSIS — D472 Monoclonal gammopathy: Secondary | ICD-10-CM | POA: Diagnosis not present

## 2020-01-05 DIAGNOSIS — I441 Atrioventricular block, second degree: Secondary | ICD-10-CM

## 2020-01-05 DIAGNOSIS — I10 Essential (primary) hypertension: Secondary | ICD-10-CM | POA: Diagnosis not present

## 2020-01-05 DIAGNOSIS — E0829 Diabetes mellitus due to underlying condition with other diabetic kidney complication: Secondary | ICD-10-CM

## 2020-01-05 DIAGNOSIS — L608 Other nail disorders: Secondary | ICD-10-CM | POA: Diagnosis not present

## 2020-01-05 LAB — CUP PACEART REMOTE DEVICE CHECK
Battery Remaining Longevity: 89 mo
Battery Remaining Percentage: 95.5 %
Battery Voltage: 3.01 V
Brady Statistic AP VP Percent: 20 %
Brady Statistic AP VS Percent: 1 %
Brady Statistic AS VP Percent: 80 %
Brady Statistic AS VS Percent: 1 %
Brady Statistic RA Percent Paced: 19 %
Date Time Interrogation Session: 20210118020014
Implantable Lead Implant Date: 20160822
Implantable Lead Implant Date: 20160822
Implantable Lead Implant Date: 20190118
Implantable Lead Location: 753858
Implantable Lead Location: 753859
Implantable Lead Location: 753860
Implantable Pulse Generator Implant Date: 20190118
Lead Channel Impedance Value: 1150 Ohm
Lead Channel Impedance Value: 400 Ohm
Lead Channel Impedance Value: 540 Ohm
Lead Channel Pacing Threshold Amplitude: 0.875 V
Lead Channel Pacing Threshold Amplitude: 1.25 V
Lead Channel Pacing Threshold Amplitude: 1.75 V
Lead Channel Pacing Threshold Pulse Width: 0.5 ms
Lead Channel Pacing Threshold Pulse Width: 0.5 ms
Lead Channel Pacing Threshold Pulse Width: 0.5 ms
Lead Channel Sensing Intrinsic Amplitude: 1.5 mV
Lead Channel Sensing Intrinsic Amplitude: 12 mV
Lead Channel Setting Pacing Amplitude: 1.875
Lead Channel Setting Pacing Amplitude: 2.25 V
Lead Channel Setting Pacing Amplitude: 2.75 V
Lead Channel Setting Pacing Pulse Width: 0.5 ms
Lead Channel Setting Pacing Pulse Width: 0.5 ms
Lead Channel Setting Sensing Sensitivity: 4 mV
Pulse Gen Model: 3262
Pulse Gen Serial Number: 8983138

## 2020-01-05 LAB — MICROALBUMIN / CREATININE URINE RATIO
Creatinine,U: 44.6 mg/dL
Microalb Creat Ratio: 72.9 mg/g — ABNORMAL HIGH (ref 0.0–30.0)
Microalb, Ur: 32.5 mg/dL — ABNORMAL HIGH (ref 0.0–1.9)

## 2020-01-05 LAB — HEMOGLOBIN A1C: Hgb A1c MFr Bld: 7.7 % — ABNORMAL HIGH (ref 4.6–6.5)

## 2020-01-06 NOTE — Progress Notes (Signed)
PPM Remote  

## 2020-01-07 ENCOUNTER — Ambulatory Visit: Payer: Medicare Other

## 2020-01-07 DIAGNOSIS — H34811 Central retinal vein occlusion, right eye, with macular edema: Secondary | ICD-10-CM | POA: Diagnosis not present

## 2020-01-09 NOTE — Progress Notes (Signed)
Patient ID: Michael Belcher Sr.                 DOB: 1946/01/20                      MRN: [id number]     HPI: Michael Olesen Sr. is a 74 y.o. male referred by Dr. Lovena Le to pharmacy clinic for CHF medication optimization. PMH is significant for HFrEF (LVEF 30% in 2018), T2DM, IgG kappa smoldering multiple myeloma, HTN, HLD and heart block s/p PPM.   Patient was last seen in HTN/HF clinic on 12/23/19. He mentioned having mild joint aches and some swelling at night, but normal in the AM. He also reports high BP in the morning, but lower in the afternoon. However, all readings are still above goal of <130/80 mmHg. BP elevated in clinic at 144/80 mmHg and  BMET on 12/23/19 showed slight increase in Scr from 1.23 to 1.32. Entresto was increased to 97/103 mg twice daily. The patient was also encouraged to stay hydrated with water and increase exercise.   Patient arrives in HTN/HF clinic for follow-up. He is doing well and got his first COVID vaccine. The patient reports he has been taking Entresto 49/51 mg in the morning and 97/'103mg'$  in the evening instead of 97/103 mg twice daily. He states that he sometimes has dizziness, especially when bending over, if he takes Entresto 97/'103mg'$  in the morning. The patient is still working his way up to 97/103 mg twice daily and will do so when he can tolerate it. He also states that he still has some swelling but it's manageable. He reports home BP readings of  119/79, 109/70, 129/84, and 143/76 mmHg. The patient admits to not eating healthy all the time, but he says he always adheres to his diabetic diet.  Current HTN/HF meds: Entresto 49/51 every morning and Entresto 97-103 mg every evening, carvedilol 37.5 mg twice daily, spironolactone 25 mg once daily Previously tried: losartan-HCTZ 100/25 mg (urinary frequency), amlodipine (swelling) BP goal: <130/67mHg  Family History: HTN, cancer, stroke in mother; HTN in sister; HTN in brother; HTN in maternal  grandmother  Social History: former smoker (quit at age 74, no alcohol intake  Diet:  Breakfast: eggs, peppers, onions, cheese OR oatmeal Lunch: chicken pita wrap and fries, diet coke Dinner: pinto beans, brown rice, chopped steak once per month, lots of trout and grilled fish or roasted chicken Limited sweets - low calorie options  Snacks - potato chips or pretzels   Exercise: building maintenance 8 hours daily, aerobic/breathing exercises in the morning, used to walk daily at the mall  Home BP readings: 119/79, 109/70, 129/84, and 143/76 mmHg  Wt Readings from Last 3 Encounters:  01/05/20 213 lb (96.6 kg)  10/08/19 216 lb (98 kg)  10/02/19 214 lb (97.1 kg)   BP Readings from Last 3 Encounters:  01/05/20 124/84  12/23/19 (!) 144/80  12/04/19 130/72   Pulse Readings from Last 3 Encounters:  01/05/20 64  12/23/19 60  12/04/19 61    Renal function: Estimated Creatinine Clearance: 58.7 mL/min (A) (by C-G formula based on SCr of 1.32 mg/dL (H)).  Past Medical History:  Diagnosis Date  . Anemia   . Cataract   . [place]   . Diabetes mellitus   . GERD (gastroesophageal reflux disease)   . Hyperlipidemia   . Hypertension   . Iron deficiency anemia due to chronic blood loss 10/02/2019  . MGUS (monoclonal  gammopathy of unknown significance)   . Multiple myeloma ([place]) 02/2013   normal cytogenetics and FISH panel on 03/11/2013.   . Osteoarthritis   . Presence of permanent cardiac pacemaker   . Right bundle branch block   . Syncope     Current Outpatient Medications on File Prior to Visit  Medication Sig Dispense Refill  . albuterol (PROVENTIL HFA;VENTOLIN HFA) 108 (90 Base) MCG/ACT inhaler Inhale 2 puffs into the lungs every 6 (six) hours as needed for wheezing or shortness of breath. 1 Inhaler 2  . ASPIRIN 81 PO Take 81 mg by mouth daily.    Marland Kitchen atorvastatin (LIPITOR) 20 MG tablet TAKE 1 TABLET BY MOUTH DAILY. PLEASE KEEP UPCOMING  APPT FOR FUTURE REFILLS. THANK YOU. 90 tablet 3  . carvedilol (COREG) 25 MG tablet Take 37.5 mg by mouth 2 (two) times daily with a meal. Take one tablet and a half, total of 37.5 mg twice a day.     . ferrous sulfate 325 (65 FE) MG tablet Take 325 mg by mouth every other day.    . fluticasone (FLONASE) 50 MCG/ACT nasal spray Place 2 sprays into both nostrils daily as needed for allergies or rhinitis. 16 g 5  . Ginkgo Biloba (GINKOBA) 40 MG TABS Take 1 tablet by mouth every other day.    Marland Kitchen glipiZIDE (GLUCOTROL XL) 5 MG 24 hr tablet TAKE 1 TABLET BY MOUTH TWICE A DAY 180 tablet 1  . glucose blood test strip Use as instructed 100 each 12  . latanoprost (XALATAN) 0.005 % ophthalmic solution Place 1 drop into both eyes at bedtime.     . metFORMIN (GLUCOPHAGE) 1000 MG tablet TAKE 1 TABLET (1,000 MG TOTAL) BY MOUTH 2 (TWO) TIMES DAILY WITH A MEAL. 180 tablet 1  . Multiple Vitamin (MULTIVITAMIN WITH MINERALS) TABS Take 1 tablet by mouth daily.     . Omega-3 Fatty Acids (FISH OIL BURP-LESS PO) Take 1 capsule by mouth every other day.     . sacubitril-valsartan (ENTRESTO) 97-103 MG Take 1 tablet by mouth 2 (two) times daily. 60 tablet 11  . spironolactone (ALDACTONE) 25 MG tablet Take 1 tablet (25 mg total) by mouth daily. 90 tablet 3   No current facility-administered medications on file prior to visit.    Allergies  Allergen Reactions  . Amlodipine Swelling  . Crestor [Rosuvastatin] Other (See Comments)    Per pt unable to focus     Assessment/Plan:  1. Hypertension/CHF medication titration - BP at home is below goal of <130/80 mmHg most of the time, however BP reading in clinic was above goal. Patient checks his blood pressure in the AM before medications and then again in the afternoon. AM readings are the only readings above goal occassionally. No medication changes today. Continue taking Entresto 49/51 every morning and Entresto 97-103 mg every evening, carvedilol 37.5 mg twice daily and  spironolactone 25 mg once daily. Explained to patient that he can continue taking 49/'51mg'$  of Entresto in the morning and increase if he feels comfortable doing so. We talked to the patient about discussing with his PCP down the line starting Jardiance for HF benefit and diabetes benefit. This could allow for lower dose of glipizide or potentially replace glipizide. BMET today and will call patient with results tomorrow. Follow-up in clinic as needed.   Julieta Bellini, PharmD Candidate  Ramond Dial, [place].D, BCPS, CPP [place]  [address], [place], [place] [address]  Phone: [phone]; Fax: [phone]

## 2020-01-12 ENCOUNTER — Ambulatory Visit (INDEPENDENT_AMBULATORY_CARE_PROVIDER_SITE_OTHER): Payer: Medicare Other | Admitting: Pharmacist

## 2020-01-12 ENCOUNTER — Other Ambulatory Visit: Payer: Self-pay

## 2020-01-12 ENCOUNTER — Encounter: Payer: Self-pay | Admitting: Pharmacist

## 2020-01-12 ENCOUNTER — Encounter: Payer: Self-pay | Admitting: Family Medicine

## 2020-01-12 VITALS — BP 138/78 | HR 74

## 2020-01-12 DIAGNOSIS — I5022 Chronic systolic (congestive) heart failure: Secondary | ICD-10-CM | POA: Diagnosis not present

## 2020-01-12 DIAGNOSIS — I10 Essential (primary) hypertension: Secondary | ICD-10-CM

## 2020-01-12 LAB — BASIC METABOLIC PANEL
BUN/Creatinine Ratio: 20 (ref 10–24)
BUN: 25 mg/dL (ref 8–27)
CO2: 21 mmol/L (ref 20–29)
Calcium: 8.8 mg/dL (ref 8.6–10.2)
Chloride: 100 mmol/L (ref 96–106)
Creatinine, Ser: 1.24 mg/dL (ref 0.76–1.27)
GFR calc Af Amer: 66 mL/min/{1.73_m2} (ref >59)
GFR calc non Af Amer: 57 mL/min/{1.73_m2} — ABNORMAL LOW (ref >59)
Glucose: 299 mg/dL — ABNORMAL HIGH (ref 65–99)
Potassium: 5.1 mmol/L (ref 3.5–5.2)
Sodium: 133 mmol/L — ABNORMAL LOW (ref 134–144)

## 2020-01-12 NOTE — Patient Instructions (Addendum)
It was a pleasure to see you again today!  Please continue carvedilol 37.5 mg twice daily, spironolactone25 mg once daily and Entresto 49/51mg  every morning and 97/103mg  every evening.   Consider speaking with Dr. Lorelei Pont about Michael Wiggins or Michael Wiggins for both your diabetes and heart failure. May be able to reduce or stop glipizide.  Continue checking your blood pressure. Follow up with [place] as needed.  Before checking your blood pressure make sure: You are seated and quite for 5 min before checking Feet are flat on the floor Siting in chair with your back supported straight up and down Arm resting on table or arm of chair at heart level Bladder is empty You have NOT had caffeine or tobacco within the last 30 min  Check your blood pressure 2-3 times about 1-2 min apart. Usually the first reading will be the highest. Record these readings.  Only check your blood pressure once a day, unless otherwise directed Record your blood pressure readings and bring them to all your appointments. If your meter stores your readings in its memory, then you may bring your blood pressure meter with you to your appointments.  You can find a list of validated (accurate) blood pressure cuffs at [employer]  Lifestyle changes can make a world of difference and are even more important than medications: Try to keep your sodium intake to 2300 mg of sodium per day Get 6-8 uninterrupted hours of sleep per night Aim for a goal of 150 min of moderate aerobic exercise (ie brisk walking, bike riding) per week  Call us at [phone] with any questions or concerns

## 2020-01-13 MED ORDER — JARDIANCE 10 MG PO TABS: 10.0000 mg | ORAL_TABLET | Freq: Every day | ORAL | 6 refills | Status: DC

## 2020-01-13 NOTE — Addendum Note (Signed)
Addended by: Lamar Blinks C on: 01/13/2020 05:49 PM   Modules accepted: Orders

## 2020-01-13 NOTE — Telephone Encounter (Signed)
Spoke to patient and informed him that BMET was normal and to continue taking medications as instructed. He said he reached out to Dr. Edilia Bo about starting [place] and she agreed. The patient contacted his insurance and said Jardiance 25 mg #30 will have a copay of $97 the first time, then $47 from then on. He confirmed he is ok with the prices.   Julieta Bellini, PharmD Candidate

## 2020-02-18 DIAGNOSIS — H34811 Central retinal vein occlusion, right eye, with macular edema: Secondary | ICD-10-CM | POA: Diagnosis not present

## 2020-02-23 DIAGNOSIS — R2689 Other abnormalities of gait and mobility: Secondary | ICD-10-CM | POA: Diagnosis not present

## 2020-02-23 DIAGNOSIS — M2022 Hallux rigidus, left foot: Secondary | ICD-10-CM | POA: Diagnosis not present

## 2020-02-23 DIAGNOSIS — M2021 Hallux rigidus, right foot: Secondary | ICD-10-CM | POA: Diagnosis not present

## 2020-02-23 DIAGNOSIS — M79672 Pain in left foot: Secondary | ICD-10-CM | POA: Diagnosis not present

## 2020-02-23 DIAGNOSIS — E1151 Type 2 diabetes mellitus with diabetic peripheral angiopathy without gangrene: Secondary | ICD-10-CM | POA: Diagnosis not present

## 2020-02-23 DIAGNOSIS — B351 Tinea unguium: Secondary | ICD-10-CM | POA: Diagnosis not present

## 2020-03-03 ENCOUNTER — Other Ambulatory Visit: Payer: Self-pay | Admitting: Internal Medicine

## 2020-03-31 DIAGNOSIS — H34811 Central retinal vein occlusion, right eye, with macular edema: Secondary | ICD-10-CM | POA: Diagnosis not present

## 2020-04-01 ENCOUNTER — Inpatient Hospital Stay: Payer: Medicare Other | Attending: Hematology & Oncology | Admitting: Hematology & Oncology

## 2020-04-01 ENCOUNTER — Other Ambulatory Visit: Payer: Self-pay

## 2020-04-01 ENCOUNTER — Encounter: Payer: Self-pay | Admitting: Hematology & Oncology

## 2020-04-01 ENCOUNTER — Inpatient Hospital Stay: Payer: Medicare Other

## 2020-04-01 VITALS — BP 157/90 | HR 59 | Temp 97.5°F | Resp 16 | Wt 210.0 lb

## 2020-04-01 DIAGNOSIS — C9000 Multiple myeloma not having achieved remission: Secondary | ICD-10-CM

## 2020-04-01 DIAGNOSIS — D509 Iron deficiency anemia, unspecified: Secondary | ICD-10-CM | POA: Insufficient documentation

## 2020-04-01 DIAGNOSIS — D500 Iron deficiency anemia secondary to blood loss (chronic): Secondary | ICD-10-CM

## 2020-04-01 LAB — CBC WITH DIFFERENTIAL (CANCER CENTER ONLY)
Abs Immature Granulocytes: 0.03 10*3/uL (ref 0.00–0.07)
Basophils Absolute: 0.1 10*3/uL (ref 0.0–0.1)
Basophils Relative: 1 %
Eosinophils Absolute: 0.6 10*3/uL — ABNORMAL HIGH (ref 0.0–0.5)
Eosinophils Relative: 8 %
HCT: 37.7 % — ABNORMAL LOW (ref 39.0–52.0)
Hemoglobin: 12.1 g/dL — ABNORMAL LOW (ref 13.0–17.0)
Immature Granulocytes: 0 %
Lymphocytes Relative: 27 %
Lymphs Abs: 1.8 10*3/uL (ref 0.7–4.0)
MCH: 28.9 pg (ref 26.0–34.0)
MCHC: 32.1 g/dL (ref 30.0–36.0)
MCV: 90.2 fL (ref 80.0–100.0)
Monocytes Absolute: 0.7 10*3/uL (ref 0.1–1.0)
Monocytes Relative: 10 %
Neutro Abs: 3.6 10*3/uL (ref 1.7–7.7)
Neutrophils Relative %: 54 %
Platelet Count: 223 10*3/uL (ref 150–400)
RBC: 4.18 MIL/uL — ABNORMAL LOW (ref 4.22–5.81)
RDW: 14.4 % (ref 11.5–15.5)
WBC Count: 6.7 10*3/uL (ref 4.0–10.5)
nRBC: 0 % (ref 0.0–0.2)

## 2020-04-01 LAB — CMP (CANCER CENTER ONLY)
ALT: 15 U/L (ref 0–44)
AST: 14 U/L — ABNORMAL LOW (ref 15–41)
Albumin: 3.9 g/dL (ref 3.5–5.0)
Alkaline Phosphatase: 42 U/L (ref 38–126)
Anion gap: 5 (ref 5–15)
BUN: 25 mg/dL — ABNORMAL HIGH (ref 8–23)
CO2: 27 mmol/L (ref 22–32)
Calcium: 9.6 mg/dL (ref 8.9–10.3)
Chloride: 104 mmol/L (ref 98–111)
Creatinine: 1.55 mg/dL — ABNORMAL HIGH (ref 0.61–1.24)
GFR, Est AFR Am: 50 mL/min — ABNORMAL LOW (ref >60)
GFR, Estimated: 43 mL/min — ABNORMAL LOW (ref >60)
Glucose, Bld: 142 mg/dL — ABNORMAL HIGH (ref 70–99)
Potassium: 4.6 mmol/L (ref 3.5–5.1)
Sodium: 136 mmol/L (ref 135–145)
Total Bilirubin: 0.3 mg/dL (ref 0.3–1.2)
Total Protein: 7.3 g/dL (ref 6.5–8.1)

## 2020-04-01 LAB — IRON AND TIBC
Iron: 36 ug/dL — ABNORMAL LOW (ref 42–163)
Saturation Ratios: 11 % — ABNORMAL LOW (ref 20–55)
TIBC: 320 ug/dL (ref 202–409)
UIBC: 284 ug/dL (ref 117–376)

## 2020-04-01 LAB — FERRITIN: Ferritin: 44 ng/mL (ref 24–336)

## 2020-04-01 NOTE — Progress Notes (Signed)
Hematology and Oncology Follow Up Visit  Michael Iadarola Sr. [id number] 1946/08/25 74 y.o. 04/01/2020   Principle Diagnosis:   IgG kappa smoldering myeloma  Iron deficiency anemia  Current Therapy:    Observation     Interim History:  [name] is back for followup.  So far, he is doing quite well.  He really has had no specific complaints.  His blood sugars have been doing a little bit better.  His family doctor is working real hard to keep his blood sugars down.  As far as the smoldering myeloma goes, his levels have been stable.  When we last saw him, the M spike was 1.3 g/dL.  The IgG level was 2200 mg/dL.  The kappa light chain was 9.2 mg/dL.  There is been no problems with fever.  He has had no bleeding or bruising.  He has had his coronavirus vaccines.  There is no change in bowel or bladder habits.  He is had no leg swelling.  Overall, I would say performance status is ECOG 1.     Medications:  Current Outpatient Medications:  .  ASPIRIN 81 PO, Take 81 mg by mouth daily. 04/01/2020 Takes every other day., Disp: , Rfl:  .  atorvastatin (LIPITOR) 20 MG tablet, TAKE 1 TABLET BY MOUTH DAILY. PLEASE KEEP UPCOMING APPT FOR FUTURE REFILLS. THANK YOU., Disp: 90 tablet, Rfl: 3 .  carvedilol (COREG) 25 MG tablet, TAKE 1.5 TABLETS BY MOUTH TWICE A DAY, Disp: 270 tablet, Rfl: 1 .  empagliflozin (JARDIANCE) 10 MG TABS tablet, Take 10 mg by mouth daily before breakfast., Disp: 30 tablet, Rfl: 6 .  ENTRESTO 49-51 MG, Take 1 tablet by mouth daily., Disp: , Rfl:  .  ferrous sulfate 325 (65 FE) MG tablet, Take 325 mg by mouth every other day., Disp: , Rfl:  .  Ginkgo Biloba (GINKOBA) 40 MG TABS, Take 1 tablet by mouth daily. , Disp: , Rfl:  .  glucose blood test strip, Use as instructed, Disp: 100 each, Rfl: 12 .  latanoprost (XALATAN) 0.005 % ophthalmic solution, Place 1 drop into both eyes at bedtime. , Disp: , Rfl:  .  metFORMIN (GLUCOPHAGE) 1000 MG tablet, TAKE 1 TABLET (1,000 MG  TOTAL) BY MOUTH 2 (TWO) TIMES DAILY WITH A MEAL., Disp: 180 tablet, Rfl: 1 .  Multiple Vitamin (MULTIVITAMIN WITH MINERALS) TABS, Take 1 tablet by mouth daily. , Disp: , Rfl:  .  Omega-3 Fatty Acids (FISH OIL BURP-LESS PO), Take 1 capsule by mouth every other day. , Disp: , Rfl:  .  sacubitril-valsartan (ENTRESTO) 97-103 MG, Take 1 tablet by mouth 2 (two) times daily. (Patient taking differently: Take 1 tablet by mouth every evening. ), Disp: 60 tablet, Rfl: 11 .  spironolactone (ALDACTONE) 25 MG tablet, Take 1 tablet (25 mg total) by mouth daily., Disp: 90 tablet, Rfl: 3 .  albuterol (PROVENTIL HFA;VENTOLIN HFA) 108 (90 Base) MCG/ACT inhaler, Inhale 2 puffs into the lungs every 6 (six) hours as needed for wheezing or shortness of breath. (Patient not taking: Reported on 04/01/2020), Disp: 1 Inhaler, Rfl: 2 .  fluticasone (FLONASE) 50 MCG/ACT nasal spray, Place 2 sprays into both nostrils daily as needed for allergies or rhinitis. (Patient not taking: Reported on 04/01/2020), Disp: 16 g, Rfl: 5  Allergies:  Allergies  Allergen Reactions  . Amlodipine Swelling  . Crestor [Rosuvastatin] Other (See Comments)    Per pt unable to focus    Past Medical History, Surgical history, Social history, and Family  History were reviewed and updated.  Review of Systems: Review of Systems  Constitutional: Negative.   HENT: Negative.   Eyes: Negative.   Respiratory: Negative.   Cardiovascular: Negative.   Gastrointestinal: Negative.   Genitourinary: Negative.   Musculoskeletal: Negative.   Skin: Negative.   Neurological: Negative.   Endo/Heme/Allergies: Negative.   Psychiatric/Behavioral: Negative.      Physical Exam:  weight is 210 lb (95.3 kg). His temporal temperature is 97.5 F (36.4 C) (abnormal). His blood pressure is 157/90 (abnormal) and his pulse is 59 (abnormal). His respiration is 16 and oxygen saturation is 100%.   Physical Exam Vitals reviewed.  HENT:     Head: Normocephalic and  atraumatic.  Eyes:     Pupils: Pupils are equal, round, and reactive to light.  Cardiovascular:     Rate and Rhythm: Normal rate and regular rhythm.     Heart sounds: Normal heart sounds.  Pulmonary:     Effort: Pulmonary effort is normal.     Breath sounds: Normal breath sounds.  Abdominal:     General: Bowel sounds are normal.     Palpations: Abdomen is soft.  Musculoskeletal:        General: No tenderness or deformity. Normal range of motion.     Cervical back: Normal range of motion.  Lymphadenopathy:     Cervical: No cervical adenopathy.  Skin:    General: Skin is warm and dry.     Findings: No erythema or rash.  Neurological:     Mental Status: He is alert and oriented to person, place, and time.  Psychiatric:        Behavior: Behavior normal.        Thought Content: Thought content normal.        Judgment: Judgment normal.       Lab Results  Component Value Date   WBC 6.7 04/01/2020   HGB 12.1 (L) 04/01/2020   HCT 37.7 (L) 04/01/2020   MCV 90.2 04/01/2020   PLT 223 04/01/2020     Chemistry      Component Value Date/Time   NA 133 (L) 01/12/2020 1015   NA 142 10/03/2017 0910   NA 137 04/03/2017 0852   K 5.1 01/12/2020 1015   K 3.9 10/03/2017 0910   K 4.2 04/03/2017 0852   CL 100 01/12/2020 1015   CL 103 10/03/2017 0910   CL 108 (H) 05/15/2013 0912   CO2 21 01/12/2020 1015   CO2 28 10/03/2017 0910   CO2 26 04/03/2017 0852   BUN 25 01/12/2020 1015   BUN 13 10/03/2017 0910   BUN 16.3 04/03/2017 0852   CREATININE 1.24 01/12/2020 1015   CREATININE 1.32 (H) 10/02/2019 0833   CREATININE 1.07 11/16/2017 1437   CREATININE 1.0 04/03/2017 0852      Component Value Date/Time   CALCIUM 8.8 01/12/2020 1015   CALCIUM 9.3 10/03/2017 0910   CALCIUM 9.0 04/03/2017 0852   ALKPHOS 48 10/02/2019 0833   ALKPHOS 51 10/03/2017 0910   ALKPHOS 46 04/03/2017 0852   AST 18 10/02/2019 0833   AST 18 04/03/2017 0852   ALT 19 10/02/2019 0833   ALT 34 10/03/2017 0910   ALT  19 04/03/2017 0852   BILITOT 0.4 10/02/2019 0833   BILITOT 0.38 04/03/2017 0852         Impression and Plan: [name] is 74 year old gentleman with IgG kappa smoldering myeloma. So far, I do not see any evidence of progressive disease.   We will see  what his myeloma numbers look like today. I would be surprised if there is much change.  Again, he is totally asymptomatic.  We will plan to get him back to see [place] in another 6 months.  Volanda Napoleon, MD 4/15/20219:19 AM

## 2020-04-02 ENCOUNTER — Telehealth: Payer: Self-pay | Admitting: *Deleted

## 2020-04-02 LAB — KAPPA/LAMBDA LIGHT CHAINS
Kappa free light chain: 100.1 mg/L — ABNORMAL HIGH (ref 3.3–19.4)
Kappa, lambda light chain ratio: 4.53 — ABNORMAL HIGH (ref 0.26–1.65)
Lambda free light chains: 22.1 mg/L (ref 5.7–26.3)

## 2020-04-02 LAB — IGG, IGA, IGM
IgA: 271 mg/dL (ref 61–437)
IgG (Immunoglobin G), Serum: 2211 mg/dL — ABNORMAL HIGH (ref 603–1613)
IgM (Immunoglobulin M), Srm: 20 mg/dL (ref 15–143)

## 2020-04-02 NOTE — Telephone Encounter (Signed)
the myeloma is stable!! The iron is still quite low! You really need a dose of IV iron. Please try to set up if he agrees! Appt set up for Thursday

## 2020-04-05 ENCOUNTER — Telehealth: Payer: Self-pay | Admitting: Hematology & Oncology

## 2020-04-05 ENCOUNTER — Other Ambulatory Visit: Payer: Self-pay | Admitting: Internal Medicine

## 2020-04-05 ENCOUNTER — Ambulatory Visit (INDEPENDENT_AMBULATORY_CARE_PROVIDER_SITE_OTHER): Payer: Medicare Other | Admitting: *Deleted

## 2020-04-05 DIAGNOSIS — I441 Atrioventricular block, second degree: Secondary | ICD-10-CM | POA: Diagnosis not present

## 2020-04-05 LAB — CUP PACEART REMOTE DEVICE CHECK
Battery Remaining Longevity: 86 mo
Battery Remaining Percentage: 95.5 %
Battery Voltage: 2.99 V
Brady Statistic AP VP Percent: 17 %
Brady Statistic AP VS Percent: 1 %
Brady Statistic AS VP Percent: 82 %
Brady Statistic AS VS Percent: 1 %
Brady Statistic RA Percent Paced: 16 %
Date Time Interrogation Session: 20210419020028
Implantable Lead Implant Date: 20160822
Implantable Lead Implant Date: 20160822
Implantable Lead Implant Date: 20190118
Implantable Lead Location: 753858
Implantable Lead Location: 753859
Implantable Lead Location: 753860
Implantable Pulse Generator Implant Date: 20190118
Lead Channel Impedance Value: 1050 Ohm
Lead Channel Impedance Value: 390 Ohm
Lead Channel Impedance Value: 530 Ohm
Lead Channel Pacing Threshold Amplitude: 1.125 V
Lead Channel Pacing Threshold Amplitude: 1.375 V
Lead Channel Pacing Threshold Amplitude: 1.875 V
Lead Channel Pacing Threshold Pulse Width: 0.5 ms
Lead Channel Pacing Threshold Pulse Width: 0.5 ms
Lead Channel Pacing Threshold Pulse Width: 0.5 ms
Lead Channel Sensing Intrinsic Amplitude: 1.3 mV
Lead Channel Sensing Intrinsic Amplitude: 12 mV
Lead Channel Setting Pacing Amplitude: 2.125
Lead Channel Setting Pacing Amplitude: 2.375
Lead Channel Setting Pacing Amplitude: 2.875
Lead Channel Setting Pacing Pulse Width: 0.5 ms
Lead Channel Setting Pacing Pulse Width: 0.5 ms
Lead Channel Setting Sensing Sensitivity: 4 mV
Pulse Gen Model: 3262
Pulse Gen Serial Number: 8983138

## 2020-04-05 NOTE — Telephone Encounter (Signed)
Appointments scheduled calendar printed and mailed per 4/15 los

## 2020-04-05 NOTE — Progress Notes (Signed)
PPM Remote  

## 2020-04-06 LAB — PROTEIN ELECTROPHORESIS, SERUM, WITH REFLEX
A/G Ratio: 0.9 (ref 0.7–1.7)
Albumin ELP: 3.5 g/dL (ref 2.9–4.4)
Alpha-1-Globulin: 0.2 g/dL (ref 0.0–0.4)
Alpha-2-Globulin: 0.9 g/dL (ref 0.4–1.0)
Beta Globulin: 1 g/dL (ref 0.7–1.3)
Gamma Globulin: 2 g/dL — ABNORMAL HIGH (ref 0.4–1.8)
Globulin, Total: 4 g/dL — ABNORMAL HIGH (ref 2.2–3.9)
M-Spike, %: 1.2 g/dL — ABNORMAL HIGH
SPEP Interpretation: 0
Total Protein ELP: 7.5 g/dL (ref 6.0–8.5)

## 2020-04-06 LAB — IMMUNOFIXATION REFLEX, SERUM
IgA: 282 mg/dL (ref 61–437)
IgG (Immunoglobin G), Serum: 2310 mg/dL — ABNORMAL HIGH (ref 603–1613)
IgM (Immunoglobulin M), Srm: 22 mg/dL (ref 15–143)

## 2020-04-07 ENCOUNTER — Encounter: Payer: Self-pay | Admitting: *Deleted

## 2020-04-08 ENCOUNTER — Other Ambulatory Visit: Payer: Self-pay | Admitting: Family

## 2020-04-08 ENCOUNTER — Other Ambulatory Visit: Payer: Self-pay

## 2020-04-08 ENCOUNTER — Inpatient Hospital Stay: Payer: Medicare Other

## 2020-04-08 VITALS — BP 148/89 | HR 60 | Temp 97.5°F | Resp 18

## 2020-04-08 DIAGNOSIS — D500 Iron deficiency anemia secondary to blood loss (chronic): Secondary | ICD-10-CM

## 2020-04-08 DIAGNOSIS — C9000 Multiple myeloma not having achieved remission: Secondary | ICD-10-CM | POA: Diagnosis not present

## 2020-04-08 DIAGNOSIS — D509 Iron deficiency anemia, unspecified: Secondary | ICD-10-CM | POA: Diagnosis not present

## 2020-04-08 MED ORDER — SODIUM CHLORIDE 0.9 % IV SOLN
Freq: Once | INTRAVENOUS | Status: AC
  Filled 2020-04-08: qty 250

## 2020-04-08 MED ORDER — DIPHENHYDRAMINE HCL 25 MG PO TABS
25.0000 mg | ORAL_TABLET | Freq: Once | ORAL | Status: AC
  Administered 2020-04-08: 25 mg via ORAL
  Filled 2020-04-08: qty 1

## 2020-04-08 MED ORDER — SODIUM CHLORIDE 0.9 % IV SOLN
200.0000 mg | Freq: Once | INTRAVENOUS | Status: AC
  Administered 2020-04-08: 200 mg via INTRAVENOUS
  Filled 2020-04-08: qty 200

## 2020-04-08 MED ORDER — DIPHENHYDRAMINE HCL 25 MG PO CAPS
ORAL_CAPSULE | ORAL | Status: AC
  Filled 2020-04-08: qty 1

## 2020-04-08 NOTE — Progress Notes (Signed)
Itching has resolved. OK to d/c per Judson Roch, NP. dph

## 2020-04-08 NOTE — Progress Notes (Signed)
At the end of the 30 min. post infusion observation period pt. stated that he feels "itchy" on his R arm and abdomen. Denies any other symptoms.Pt. States that sometimes he feels itchy because he is diabetic. Scherrie Bateman, NP notified and verbally ordered 25 mg of Benadryl capsule that was given at 11:25.

## 2020-04-08 NOTE — Progress Notes (Signed)
Patient's insurance okay to receive Venofer today per Otilio Carpen, Financial Advocate.

## 2020-04-18 ENCOUNTER — Other Ambulatory Visit: Payer: Self-pay | Admitting: Internal Medicine

## 2020-05-03 DIAGNOSIS — M2021 Hallux rigidus, right foot: Secondary | ICD-10-CM | POA: Diagnosis not present

## 2020-05-03 DIAGNOSIS — M2022 Hallux rigidus, left foot: Secondary | ICD-10-CM | POA: Diagnosis not present

## 2020-05-03 DIAGNOSIS — R2689 Other abnormalities of gait and mobility: Secondary | ICD-10-CM | POA: Diagnosis not present

## 2020-05-03 DIAGNOSIS — M79672 Pain in left foot: Secondary | ICD-10-CM | POA: Diagnosis not present

## 2020-05-26 DIAGNOSIS — H34811 Central retinal vein occlusion, right eye, with macular edema: Secondary | ICD-10-CM | POA: Diagnosis not present

## 2020-06-19 ENCOUNTER — Other Ambulatory Visit: Payer: Self-pay | Admitting: Family Medicine

## 2020-07-02 ENCOUNTER — Emergency Department (HOSPITAL_BASED_OUTPATIENT_CLINIC_OR_DEPARTMENT_OTHER)
Admission: EM | Admit: 2020-07-02 | Discharge: 2020-07-02 | Disposition: A | Discharge status: Home / Self Care | Payer: Medicare Other | Attending: Emergency Medicine | Admitting: Emergency Medicine

## 2020-07-02 ENCOUNTER — Encounter (HOSPITAL_BASED_OUTPATIENT_CLINIC_OR_DEPARTMENT_OTHER): Payer: Self-pay | Admitting: *Deleted

## 2020-07-02 ENCOUNTER — Other Ambulatory Visit: Payer: Self-pay

## 2020-07-02 ENCOUNTER — Emergency Department (HOSPITAL_BASED_OUTPATIENT_CLINIC_OR_DEPARTMENT_OTHER): Payer: Medicare Other

## 2020-07-02 DIAGNOSIS — R079 Chest pain, unspecified: Secondary | ICD-10-CM | POA: Diagnosis not present

## 2020-07-02 DIAGNOSIS — Y929 Unspecified place or not applicable: Secondary | ICD-10-CM | POA: Diagnosis not present

## 2020-07-02 DIAGNOSIS — Z809 Family history of malignant neoplasm, unspecified: Secondary | ICD-10-CM | POA: Diagnosis not present

## 2020-07-02 DIAGNOSIS — S46912A Strain of unspecified muscle, fascia and tendon at shoulder and upper arm level, left arm, initial encounter: Secondary | ICD-10-CM | POA: Insufficient documentation

## 2020-07-02 DIAGNOSIS — Z87891 Personal history of nicotine dependence: Secondary | ICD-10-CM | POA: Diagnosis not present

## 2020-07-02 DIAGNOSIS — Z79899 Other long term (current) drug therapy: Secondary | ICD-10-CM | POA: Diagnosis not present

## 2020-07-02 DIAGNOSIS — M19072 Primary osteoarthritis, left ankle and foot: Secondary | ICD-10-CM | POA: Diagnosis not present

## 2020-07-02 DIAGNOSIS — S4992XA Unspecified injury of left shoulder and upper arm, initial encounter: Secondary | ICD-10-CM | POA: Diagnosis not present

## 2020-07-02 DIAGNOSIS — Y999 Unspecified external cause status: Secondary | ICD-10-CM | POA: Diagnosis not present

## 2020-07-02 DIAGNOSIS — Y939 Activity, unspecified: Secondary | ICD-10-CM | POA: Insufficient documentation

## 2020-07-02 DIAGNOSIS — S29012A Strain of muscle and tendon of back wall of thorax, initial encounter: Secondary | ICD-10-CM | POA: Diagnosis not present

## 2020-07-02 DIAGNOSIS — S96912A Strain of unspecified muscle and tendon at ankle and foot level, left foot, initial encounter: Secondary | ICD-10-CM | POA: Diagnosis not present

## 2020-07-02 DIAGNOSIS — I10 Essential (primary) hypertension: Secondary | ICD-10-CM | POA: Diagnosis not present

## 2020-07-02 DIAGNOSIS — T148XXA Other injury of unspecified body region, initial encounter: Secondary | ICD-10-CM

## 2020-07-02 DIAGNOSIS — S99922A Unspecified injury of left foot, initial encounter: Secondary | ICD-10-CM | POA: Diagnosis not present

## 2020-07-02 NOTE — ED Notes (Signed)
Patient returns to room from X-ray
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
## 2020-07-02 NOTE — Discharge Instructions (Addendum)
You were evaluated in the Emergency Department and after careful evaluation, we did not find any emergent condition requiring admission or further testing in the hospital.  Your exam/testing today was overall reassuring.  X-rays did not show any significant injuries or broken bones.  We recommend Tylenol or Motrin at home for discomfort.  Please return to the Emergency Department if you experience any worsening of your condition.  Thank you for allowing [place] to be a part of your care.
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
## 2020-07-02 NOTE — Patient Instructions (Addendum)
It was great to see you again today, I will be in touch with your labs soon as possible.  Assuming all is well, we can plan to visit in 6 months I will adjust your Jardiance if your A1c is higher is no better

## 2020-07-02 NOTE — ED Notes (Signed)
ED Provider at bedside. 
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
## 2020-07-05 ENCOUNTER — Ambulatory Visit (INDEPENDENT_AMBULATORY_CARE_PROVIDER_SITE_OTHER): Payer: Medicare Other | Admitting: Family Medicine

## 2020-07-05 ENCOUNTER — Other Ambulatory Visit: Payer: Self-pay

## 2020-07-05 ENCOUNTER — Encounter: Payer: Self-pay | Admitting: Family Medicine

## 2020-07-05 ENCOUNTER — Ambulatory Visit (INDEPENDENT_AMBULATORY_CARE_PROVIDER_SITE_OTHER): Payer: Medicare Other | Admitting: *Deleted

## 2020-07-05 VITALS — BP 152/84 | HR 63 | Temp 97.5°F | Resp 17 | Ht 75.0 in | Wt 206.0 lb

## 2020-07-05 DIAGNOSIS — C9001 Multiple myeloma in remission: Secondary | ICD-10-CM | POA: Diagnosis not present

## 2020-07-05 DIAGNOSIS — E0829 Diabetes mellitus due to underlying condition with other diabetic kidney complication: Secondary | ICD-10-CM

## 2020-07-05 DIAGNOSIS — E782 Mixed hyperlipidemia: Secondary | ICD-10-CM

## 2020-07-05 DIAGNOSIS — I441 Atrioventricular block, second degree: Secondary | ICD-10-CM

## 2020-07-05 DIAGNOSIS — I10 Essential (primary) hypertension: Secondary | ICD-10-CM | POA: Diagnosis not present

## 2020-07-05 DIAGNOSIS — Z125 Encounter for screening for malignant neoplasm of prostate: Secondary | ICD-10-CM

## 2020-07-05 LAB — CUP PACEART REMOTE DEVICE CHECK
Battery Remaining Longevity: 89 mo
Battery Remaining Percentage: 95.5 %
Battery Voltage: 2.99 V
Brady Statistic AP VP Percent: 18 %
Brady Statistic AP VS Percent: 1 %
Brady Statistic AS VP Percent: 82 %
Brady Statistic AS VS Percent: 1 %
Brady Statistic RA Percent Paced: 17 %
Date Time Interrogation Session: 20210719020241
Implantable Lead Implant Date: 20160822
Implantable Lead Implant Date: 20160822
Implantable Lead Implant Date: 20190118
Implantable Lead Location: 753858
Implantable Lead Location: 753859
Implantable Lead Location: 753860
Implantable Pulse Generator Implant Date: 20190118
Lead Channel Impedance Value: 1050 Ohm
Lead Channel Impedance Value: 390 Ohm
Lead Channel Impedance Value: 540 Ohm
Lead Channel Pacing Threshold Amplitude: 1 V
Lead Channel Pacing Threshold Amplitude: 1.375 V
Lead Channel Pacing Threshold Amplitude: 1.625 V
Lead Channel Pacing Threshold Pulse Width: 0.5 ms
Lead Channel Pacing Threshold Pulse Width: 0.5 ms
Lead Channel Pacing Threshold Pulse Width: 0.5 ms
Lead Channel Sensing Intrinsic Amplitude: 1.2 mV
Lead Channel Sensing Intrinsic Amplitude: 12 mV
Lead Channel Setting Pacing Amplitude: 2 V
Lead Channel Setting Pacing Amplitude: 2.375
Lead Channel Setting Pacing Amplitude: 2.625
Lead Channel Setting Pacing Pulse Width: 0.5 ms
Lead Channel Setting Pacing Pulse Width: 0.5 ms
Lead Channel Setting Sensing Sensitivity: 4 mV
Pulse Gen Model: 3262
Pulse Gen Serial Number: 8983138

## 2020-07-05 LAB — LIPID PANEL
Cholesterol: 163 mg/dL (ref 0–200)
HDL: 34.6 mg/dL — ABNORMAL LOW (ref >39.00)
LDL Cholesterol: 104 mg/dL — ABNORMAL HIGH (ref 0–99)
NonHDL: 128.41
Total CHOL/HDL Ratio: 5
Triglycerides: 120 mg/dL (ref 0.0–149.0)
VLDL: 24 mg/dL (ref 0.0–40.0)

## 2020-07-05 LAB — BASIC METABOLIC PANEL
BUN: 30 mg/dL — ABNORMAL HIGH (ref 6–23)
CO2: 25 mEq/L (ref 19–32)
Calcium: 9.4 mg/dL (ref 8.4–10.5)
Chloride: 103 mEq/L (ref 96–112)
Creatinine, Ser: 1.44 mg/dL (ref 0.40–1.50)
GFR: 57.92 mL/min — ABNORMAL LOW (ref >60.00)
Glucose, Bld: 149 mg/dL — ABNORMAL HIGH (ref 70–99)
Potassium: 4.3 mEq/L (ref 3.5–5.1)
Sodium: 135 mEq/L (ref 135–145)

## 2020-07-05 LAB — PSA: PSA: 4.87 ng/mL — ABNORMAL HIGH (ref 0.10–4.00)

## 2020-07-05 LAB — HEMOGLOBIN A1C: Hgb A1c MFr Bld: 8.3 % — ABNORMAL HIGH (ref 4.6–6.5)

## 2020-07-07 ENCOUNTER — Encounter: Payer: Self-pay | Admitting: Family Medicine

## 2020-07-07 MED ORDER — EMPAGLIFLOZIN 25 MG PO TABS: 25.0000 mg | ORAL_TABLET | Freq: Every day | ORAL | 3 refills | Status: DC

## 2020-07-07 NOTE — Addendum Note (Signed)
Addended by: Lamar Blinks C on: 07/07/2020 05:57 AM   Modules accepted: Orders

## 2020-07-07 NOTE — Progress Notes (Signed)
Remote pacemaker transmission.   

## 2020-07-28 DIAGNOSIS — H34811 Central retinal vein occlusion, right eye, with macular edema: Secondary | ICD-10-CM | POA: Diagnosis not present

## 2020-08-02 DIAGNOSIS — R3912 Poor urinary stream: Secondary | ICD-10-CM | POA: Diagnosis not present

## 2020-08-18 ENCOUNTER — Encounter: Payer: Self-pay | Admitting: Family Medicine

## 2020-08-20 DIAGNOSIS — Z1159 Encounter for screening for other viral diseases: Secondary | ICD-10-CM | POA: Diagnosis not present

## 2020-08-25 DIAGNOSIS — Z86010 Personal history of colonic polyps: Secondary | ICD-10-CM | POA: Diagnosis not present

## 2020-08-25 DIAGNOSIS — D123 Benign neoplasm of transverse colon: Secondary | ICD-10-CM | POA: Diagnosis not present

## 2020-08-25 DIAGNOSIS — K5730 Diverticulosis of large intestine without perforation or abscess without bleeding: Secondary | ICD-10-CM | POA: Diagnosis not present

## 2020-08-27 DIAGNOSIS — D123 Benign neoplasm of transverse colon: Secondary | ICD-10-CM | POA: Diagnosis not present

## 2020-08-30 ENCOUNTER — Other Ambulatory Visit: Payer: Self-pay | Admitting: Internal Medicine

## 2020-09-08 ENCOUNTER — Encounter: Payer: Self-pay | Admitting: Internal Medicine

## 2020-09-08 ENCOUNTER — Other Ambulatory Visit: Payer: Self-pay

## 2020-09-08 ENCOUNTER — Ambulatory Visit: Payer: Medicare Other | Admitting: Internal Medicine

## 2020-09-08 VITALS — BP 120/62 | HR 76 | Ht 75.0 in | Wt 205.8 lb

## 2020-09-08 DIAGNOSIS — Z950 Presence of cardiac pacemaker: Secondary | ICD-10-CM

## 2020-09-08 DIAGNOSIS — I5022 Chronic systolic (congestive) heart failure: Secondary | ICD-10-CM

## 2020-09-08 DIAGNOSIS — I441 Atrioventricular block, second degree: Secondary | ICD-10-CM

## 2020-09-08 DIAGNOSIS — I10 Essential (primary) hypertension: Secondary | ICD-10-CM | POA: Diagnosis not present

## 2020-09-08 NOTE — Progress Notes (Signed)
HPI [name] returns today for ongoing evaluation of his PPM after undergoing Biv upgrade just over69months ago. In the interim, hehas done well. He is back to working regularly. He works in Art therapist. No syncope.He admits to dietary indiscretion with sodium. He notes that his bp has been high. He feels well. He is exercising regularly.  Allergies  Allergen Reactions   Amlodipine Swelling   Crestor [Rosuvastatin] Other (See Comments)    Per pt unable to focus     Current Outpatient Medications  Medication Sig Dispense Refill   albuterol (PROVENTIL HFA;VENTOLIN HFA) 108 (90 Base) MCG/ACT inhaler Inhale 2 puffs into the lungs every 6 (six) hours as needed for wheezing or shortness of breath. 1 Inhaler 2   ASPIRIN 81 PO Take 81 mg by mouth daily. 04/01/2020 Takes every other day.     atorvastatin (LIPITOR) 20 MG tablet Take 1 tablet (20 mg total) by mouth daily. 90 tablet 2   carvedilol (COREG) 25 MG tablet Take 1.5 tablets (37.5 mg total) by mouth 2 (two) times daily with a meal. Please keep upcoming appt in September with Dr. Lovena Le. Thank you 270 tablet 0   empagliflozin (JARDIANCE) 25 MG TABS tablet Take 1 tablet (25 mg total) by mouth daily before breakfast. 90 tablet 3   ferrous sulfate 325 (65 FE) MG tablet Take 325 mg by mouth every other day.     Ginkgo Biloba (GINKOBA) 40 MG TABS Take 1 tablet by mouth daily.      glucose blood test strip Use as instructed 100 each 12   latanoprost (XALATAN) 0.005 % ophthalmic solution Place 1 drop into both eyes at bedtime.      metFORMIN (GLUCOPHAGE) 1000 MG tablet Take 1 tablet (1,000 mg total) by mouth 2 (two) times daily with a meal. 180 tablet 0   Multiple Vitamin (MULTIVITAMIN WITH MINERALS) TABS Take 1 tablet by mouth daily.      Omega-3 Fatty Acids (FISH OIL BURP-LESS PO) Take 1 capsule by mouth every other day.      sacubitril-valsartan (ENTRESTO) 49-51 MG Take 1 tablet by mouth in the  morning. 30 tablet 5   sacubitril-valsartan (ENTRESTO) 97-103 MG Take 1 tablet by mouth every evening.     No current facility-administered medications for this visit.     Past Medical History:  Diagnosis Date   Anemia    Cataract    Chronic systolic (congestive) heart failure (H[facility])    Diabetes mellitus    GERD (gastroesophageal reflux disease)    Hyperlipidemia    Hypertension    Iron deficiency anemia due to chronic blood loss 10/02/2019   MGUS (monoclonal gammopathy of unknown significance)    Multiple myeloma ([place]) 02/2013   normal cytogenetics and FISH panel on 03/11/2013.    Osteoarthritis    Presence of permanent cardiac pacemaker    Right bundle branch block    Syncope     ROS:   All systems reviewed and negative except as noted in the HPI.   Past Surgical History:  Procedure Laterality Date   BIV UPGRADE  01/04/2018   BIV UPGRADE N/A 01/04/2018   Procedure: BIVP UPGRADE;  Surgeon: [name], MD;  Location: [facility];  Service: Cardiovascular;  Laterality: N/A;   EP IMPLANTABLE DEVICE N/A 08/09/2015   Procedure: Pacemaker Implant;  Surgeon: [name], MD;  Location: [facility];  Service: Cardiovascular;  Laterality: N/A;   EYE SURGERY  Family History  Problem Relation Age of Onset   Hypertension Mother    Cancer Mother    Stroke Mother    Hypertension Sister    Hypertension Brother    Hypertension Maternal Grandmother      Social History   Socioeconomic History   Marital status: Married    Spouse name: Not on file   Number of children: 3   Years of education: Not on file   Highest education level: Not on file  Occupational History   Not on file  Tobacco Use   Smoking status: Former Smoker    Packs/day: 2.00    Years: 35.00    Pack years: 70.00    Types: Cigarettes    Start date: 03/26/1963    Quit date: 12/18/1997    Years since quitting: 22.7   Smokeless tobacco: Never Used    Tobacco comment: quit 15 years  Vaping Use   Vaping Use: Never used  Substance and Sexual Activity   Alcohol use: No    Alcohol/week: 0.0 standard drinks   Drug use: No   Sexual activity: Yes    Birth control/protection: None  Other Topics Concern   Not on file  Social History Narrative   Married   Building Maintenance   Social Determinants of Health   Financial Resource Strain:    Difficulty of Paying Living Expenses: Not on file  Food Insecurity:    Worried About Running Out of Food in the Last Year: Not on file   [employer] of Food in the Last Year: Not on file  Transportation Needs:    Lack of Transportation (Medical): Not on file   Lack of Transportation (Non-Medical): Not on file  Physical Activity:    Days of Exercise per Week: Not on file   Minutes of Exercise per Session: Not on file  Stress:    Feeling of Stress : Not on file  Social Connections:    Frequency of Communication with Friends and Family: Not on file   Frequency of Social Gatherings with Friends and Family: Not on file   Attends Religious Services: Not on file   Active Member of Clubs or Organizations: Not on file   Attends Banker Meetings: Not on file   Marital Status: Not on file  Intimate Partner Violence:    Fear of Current or Ex-Partner: Not on file   Emotionally Abused: Not on file   Physically Abused: Not on file   Sexually Abused: Not on file     BP 120/62    Pulse 76    Ht 6\' 3"  (1.905 m)    Wt 205 lb 12.8 oz (93.4 kg)    SpO2 94%    BMI 25.72 kg/m   Physical Exam:  Well appearing NAD HEENT: Unremarkable Neck:  No JVD, no thyromegally Lymphatics:  No adenopathy Back:  No CVA tenderness Lungs:  Clear with no wheezes HEART:  Regular rate rhythm, no murmurs, no rubs, no clicks Abd:  soft, positive bowel sounds, no organomegally, no rebound, no guarding Ext:  2 plus pulses, no edema, no cyanosis, no clubbing Skin:  No rashes no nodules Neuro:  CN  II through XII intact, motor grossly intact  EKG - nsr with pacing induced RBBB  DEVICE  Normal device function.  See PaceArt for details.   Assess/Plan: 1. Chronic systolic heart failure - he is asymptomatic, s/p biv ICD. 2. ICD - his [employer] Jude Biv ICD is working normally. 3. CHB -  he is asymptomatic s/p biv device 4. Hyponatremia - he admits to drinking 64-100 oz of fluid daily. I asked him to stop this.  Carleene Overlie Asja Frommer,MD

## 2020-09-08 NOTE — Patient Instructions (Signed)
Medication Instructions:  Your physician recommends that you continue on your current medications as directed. Please refer to the Current Medication list given to you today.  Labwork: None ordered.  Testing/Procedures: None ordered.  Follow-Up: Your physician wants you to follow-up in: one year with Dr. Lovena Le.   You will receive a reminder letter in the mail two months in advance. If you don't receive a letter, please call our office to schedule the follow-up appointment.  Remote monitoring is used to monitor your Pacemaker from home. This monitoring reduces the number of office visits required to check your device to one time per year. It allows [place] to keep an eye on the functioning of your device to ensure it is working properly. You are scheduled for a device check from home on 10/04/2020. You may send your transmission at any time that day. If you have a wireless device, the transmission will be sent automatically. After your physician reviews your transmission, you will receive a postcard with your next transmission date.  Any Other Special Instructions Will Be Listed Below (If Applicable).  If you need a refill on your cardiac medications before your next appointment, please call your pharmacy.

## 2020-09-13 LAB — CUP PACEART INCLINIC DEVICE CHECK
Battery Remaining Longevity: 88 mo
Battery Voltage: 2.99 V
Brady Statistic RA Percent Paced: 18 %
Brady Statistic RV Percent Paced: 99.4 %
Date Time Interrogation Session: 20210922165224
Implantable Lead Implant Date: 20160822
Implantable Lead Implant Date: 20160822
Implantable Lead Implant Date: 20190118
Implantable Lead Location: 753858
Implantable Lead Location: 753859
Implantable Lead Location: 753860
Implantable Pulse Generator Implant Date: 20190118
Lead Channel Impedance Value: 1037.5 Ohm
Lead Channel Impedance Value: 425 Ohm
Lead Channel Impedance Value: 562.5 Ohm
Lead Channel Pacing Threshold Amplitude: 0.875 V
Lead Channel Pacing Threshold Amplitude: 1 V
Lead Channel Pacing Threshold Amplitude: 1.75 V
Lead Channel Pacing Threshold Pulse Width: 0.5 ms
Lead Channel Pacing Threshold Pulse Width: 0.5 ms
Lead Channel Pacing Threshold Pulse Width: 0.5 ms
Lead Channel Sensing Intrinsic Amplitude: 12 mV
Lead Channel Sensing Intrinsic Amplitude: 2.9 mV
Lead Channel Setting Pacing Amplitude: 1.875
Lead Channel Setting Pacing Amplitude: 2 V
Lead Channel Setting Pacing Amplitude: 2.75 V
Lead Channel Setting Pacing Pulse Width: 0.5 ms
Lead Channel Setting Pacing Pulse Width: 0.5 ms
Lead Channel Setting Sensing Sensitivity: 4 mV
Pulse Gen Model: 3262
Pulse Gen Serial Number: 8983138

## 2020-10-01 ENCOUNTER — Telehealth: Payer: Self-pay | Admitting: *Deleted

## 2020-10-01 ENCOUNTER — Inpatient Hospital Stay (HOSPITAL_BASED_OUTPATIENT_CLINIC_OR_DEPARTMENT_OTHER): Payer: Medicare Other | Admitting: Hematology & Oncology

## 2020-10-01 ENCOUNTER — Inpatient Hospital Stay: Payer: Medicare Other | Attending: Hematology & Oncology

## 2020-10-01 ENCOUNTER — Encounter: Payer: Self-pay | Admitting: Hematology & Oncology

## 2020-10-01 ENCOUNTER — Encounter: Payer: Self-pay | Admitting: Family Medicine

## 2020-10-01 ENCOUNTER — Telehealth: Payer: Self-pay | Admitting: Hematology & Oncology

## 2020-10-01 ENCOUNTER — Other Ambulatory Visit: Payer: Self-pay

## 2020-10-01 VITALS — BP 144/82 | HR 60 | Temp 97.9°F | Resp 18 | Wt 207.0 lb

## 2020-10-01 DIAGNOSIS — C9000 Multiple myeloma not having achieved remission: Secondary | ICD-10-CM | POA: Diagnosis not present

## 2020-10-01 DIAGNOSIS — E0829 Diabetes mellitus due to underlying condition with other diabetic kidney complication: Secondary | ICD-10-CM

## 2020-10-01 DIAGNOSIS — Z7984 Long term (current) use of oral hypoglycemic drugs: Secondary | ICD-10-CM | POA: Insufficient documentation

## 2020-10-01 DIAGNOSIS — E119 Type 2 diabetes mellitus without complications: Secondary | ICD-10-CM | POA: Insufficient documentation

## 2020-10-01 LAB — CBC WITH DIFFERENTIAL (CANCER CENTER ONLY)
Abs Immature Granulocytes: 0.03 10*3/uL (ref 0.00–0.07)
Basophils Absolute: 0.1 10*3/uL (ref 0.0–0.1)
Basophils Relative: 1 %
Eosinophils Absolute: 0.5 10*3/uL (ref 0.0–0.5)
Eosinophils Relative: 8 %
HCT: 39.7 % (ref 39.0–52.0)
Hemoglobin: 12.7 g/dL — ABNORMAL LOW (ref 13.0–17.0)
Immature Granulocytes: 1 %
Lymphocytes Relative: 29 %
Lymphs Abs: 1.8 10*3/uL (ref 0.7–4.0)
MCH: 29.2 pg (ref 26.0–34.0)
MCHC: 32 g/dL (ref 30.0–36.0)
MCV: 91.3 fL (ref 80.0–100.0)
Monocytes Absolute: 0.5 10*3/uL (ref 0.1–1.0)
Monocytes Relative: 9 %
Neutro Abs: 3.2 10*3/uL (ref 1.7–7.7)
Neutrophils Relative %: 52 %
Platelet Count: 212 10*3/uL (ref 150–400)
RBC: 4.35 MIL/uL (ref 4.22–5.81)
RDW: 14.5 % (ref 11.5–15.5)
WBC Count: 6 10*3/uL (ref 4.0–10.5)
nRBC: 0 % (ref 0.0–0.2)

## 2020-10-01 LAB — CMP (CANCER CENTER ONLY)
ALT: 15 U/L (ref 0–44)
AST: 15 U/L (ref 15–41)
Albumin: 3.9 g/dL (ref 3.5–5.0)
Alkaline Phosphatase: 46 U/L (ref 38–126)
Anion gap: 7 (ref 5–15)
BUN: 30 mg/dL — ABNORMAL HIGH (ref 8–23)
CO2: 26 mmol/L (ref 22–32)
Calcium: 9.5 mg/dL (ref 8.9–10.3)
Chloride: 101 mmol/L (ref 98–111)
Creatinine: 1.51 mg/dL — ABNORMAL HIGH (ref 0.61–1.24)
GFR, Estimated: 45 mL/min — ABNORMAL LOW (ref >60)
Glucose, Bld: 239 mg/dL — ABNORMAL HIGH (ref 70–99)
Potassium: 4.3 mmol/L (ref 3.5–5.1)
Sodium: 134 mmol/L — ABNORMAL LOW (ref 135–145)
Total Bilirubin: 0.5 mg/dL (ref 0.3–1.2)
Total Protein: 7.3 g/dL (ref 6.5–8.1)

## 2020-10-01 MED ORDER — GLIPIZIDE ER 5 MG PO TB24: 5.0000 mg | ORAL_TABLET | Freq: Every morning | ORAL | 3 refills | Status: DC

## 2020-10-01 NOTE — Progress Notes (Signed)
Hematology and Oncology Follow Up Visit  Michael Kaigler Sr. [id number] [birth year] 74 y.o. 10/01/2020   Principle Diagnosis:   IgG kappa smoldering myeloma  Iron deficiency anemia  Current Therapy:    Observation     Interim History:  [name] is back for followup.  We saw him 6 months ago.  At that time, he was doing okay.  We did give some iron at that time.  His ferritin was 44 with an iron saturation of 11%.  His big problem continues to be the diabetes.  His blood sugar today is 239.  He said his last hemoglobin A1c was 8.2.  He has had no problems with respect to myeloma.  His last M spike back in April was 1.2 g/dL his IgG level was holding steady at 2200 mg/dL.  The kappa light chain was 10 mg/dL.  He is still working.  He is quite busy.  He enjoys work.  He has had no problems with fever.  He has had no nausea or vomiting.  There is been no change in bowel or bladder habits.  Overall, I would say performance status is ECOG 1.     Medications:  Current Outpatient Medications:  .  albuterol (PROVENTIL HFA;VENTOLIN HFA) 108 (90 Base) MCG/ACT inhaler, Inhale 2 puffs into the lungs every 6 (six) hours as needed for wheezing or shortness of breath., Disp: 1 Inhaler, Rfl: 2 .  ASPIRIN 81 PO, Take 81 mg by mouth daily. 04/01/2020 Takes every other day., Disp: , Rfl:  .  atorvastatin (LIPITOR) 20 MG tablet, Take 1 tablet (20 mg total) by mouth daily., Disp: 90 tablet, Rfl: 2 .  carvedilol (COREG) 25 MG tablet, Take 1.5 tablets (37.5 mg total) by mouth 2 (two) times daily with a meal. Please keep upcoming appt in September with Dr. Lovena Le. Thank you, Disp: 270 tablet, Rfl: 0 .  empagliflozin (JARDIANCE) 25 MG TABS tablet, Take 1 tablet (25 mg total) by mouth daily before breakfast., Disp: 90 tablet, Rfl: 3 .  ferrous sulfate 325 (65 FE) MG tablet, Take 325 mg by mouth every other day., Disp: , Rfl:  .  Ginkgo Biloba (GINKOBA) 40 MG TABS, Take 1 tablet by mouth daily. , Disp: ,  Rfl:  .  glucose blood test strip, Use as instructed, Disp: 100 each, Rfl: 12 .  latanoprost (XALATAN) 0.005 % ophthalmic solution, Place 1 drop into both eyes at bedtime. , Disp: , Rfl:  .  metFORMIN (GLUCOPHAGE) 1000 MG tablet, Take 1 tablet (1,000 mg total) by mouth 2 (two) times daily with a meal., Disp: 180 tablet, Rfl: 0 .  Multiple Vitamin (MULTIVITAMIN WITH MINERALS) TABS, Take 1 tablet by mouth daily. , Disp: , Rfl:  .  Omega-3 Fatty Acids (FISH OIL BURP-LESS PO), Take 1 capsule by mouth every other day. , Disp: , Rfl:  .  sacubitril-valsartan (ENTRESTO) 49-51 MG, Take 1 tablet by mouth in the morning. (Patient not taking: Reported on 10/01/2020), Disp: 30 tablet, Rfl: 5 .  sacubitril-valsartan (ENTRESTO) 97-103 MG, Take 1 tablet by mouth every evening., Disp: , Rfl:   Allergies:  Allergies  Allergen Reactions  . Amlodipine Swelling  . Crestor [Rosuvastatin] Other (See Comments)    Per pt unable to focus    Past Medical History, Surgical history, Social history, and Family History were reviewed and updated.  Review of Systems: Review of Systems  Constitutional: Negative.   HENT: Negative.   Eyes: Negative.   Respiratory: Negative.   Cardiovascular:  Negative.   Gastrointestinal: Negative.   Genitourinary: Negative.   Musculoskeletal: Negative.   Skin: Negative.   Neurological: Negative.   Endo/Heme/Allergies: Negative.   Psychiatric/Behavioral: Negative.      Physical Exam:  weight is 207 lb (93.9 kg). His oral temperature is 97.9 F (36.6 C). His blood pressure is 144/82 (abnormal) and his pulse is 60. His respiration is 18 and oxygen saturation is 98%.   Physical Exam Vitals reviewed.  HENT:     Head: Normocephalic and atraumatic.  Eyes:     Pupils: Pupils are equal, round, and reactive to light.  Cardiovascular:     Rate and Rhythm: Normal rate and regular rhythm.     Heart sounds: Normal heart sounds.  Pulmonary:     Effort: Pulmonary effort is normal.      Breath sounds: Normal breath sounds.  Abdominal:     General: Bowel sounds are normal.     Palpations: Abdomen is soft.  Musculoskeletal:        General: No tenderness or deformity. Normal range of motion.     Cervical back: Normal range of motion.  Lymphadenopathy:     Cervical: No cervical adenopathy.  Skin:    General: Skin is warm and dry.     Findings: No erythema or rash.  Neurological:     Mental Status: He is alert and oriented to person, place, and time.  Psychiatric:        Behavior: Behavior normal.        Thought Content: Thought content normal.        Judgment: Judgment normal.       Lab Results  Component Value Date   WBC 6.0 10/01/2020   HGB 12.7 (L) 10/01/2020   HCT 39.7 10/01/2020   MCV 91.3 10/01/2020   PLT 212 10/01/2020     Chemistry      Component Value Date/Time   NA 134 (L) 10/01/2020 0941   NA 133 (L) 01/12/2020 1015   NA 142 10/03/2017 0910   NA 137 04/03/2017 0852   K 4.3 10/01/2020 0941   K 3.9 10/03/2017 0910   K 4.2 04/03/2017 0852   CL 101 10/01/2020 0941   CL 103 10/03/2017 0910   CL 108 (H) 05/15/2013 0912   CO2 26 10/01/2020 0941   CO2 28 10/03/2017 0910   CO2 26 04/03/2017 0852   BUN 30 (H) 10/01/2020 0941   BUN 25 01/12/2020 1015   BUN 13 10/03/2017 0910   BUN 16.3 04/03/2017 0852   CREATININE 1.51 (H) 10/01/2020 0941   CREATININE 1.07 11/16/2017 1437   CREATININE 1.0 04/03/2017 0852      Component Value Date/Time   CALCIUM 9.5 10/01/2020 0941   CALCIUM 9.3 10/03/2017 0910   CALCIUM 9.0 04/03/2017 0852   ALKPHOS 46 10/01/2020 0941   ALKPHOS 51 10/03/2017 0910   ALKPHOS 46 04/03/2017 0852   AST 15 10/01/2020 0941   AST 18 04/03/2017 0852   ALT 15 10/01/2020 0941   ALT 34 10/03/2017 0910   ALT 19 04/03/2017 0852   BILITOT 0.5 10/01/2020 0941   BILITOT 0.38 04/03/2017 0852     Impression and Plan: [name] is 74 year old gentleman with IgG kappa smoldering myeloma. So far, I do not see any evidence of  progressive disease.   We will see what his myeloma numbers look like today. I would be surprised if there is much change.  Again, his preproblem will be the diabetes.  He sees his family  doctor to get this under control.  We will plan to get him back to see [place] in another 6 months.  Volanda Napoleon, MD 10/15/202110:36 AM

## 2020-10-01 NOTE — Telephone Encounter (Signed)
Pt has lab appointment on Tuesday. Scheduling note says hgb a1c. There is no future order in Epic.  Please place order if appropriate.

## 2020-10-01 NOTE — Telephone Encounter (Signed)
Appointments scheduled and patient has My Chart Access for appt info per 10/15 los

## 2020-10-02 LAB — IGG, IGA, IGM
IgA: 263 mg/dL (ref 61–437)
IgG (Immunoglobin G), Serum: 2167 mg/dL — ABNORMAL HIGH (ref 603–1613)
IgM (Immunoglobulin M), Srm: 33 mg/dL (ref 15–143)

## 2020-10-04 LAB — KAPPA/LAMBDA LIGHT CHAINS
Kappa free light chain: 117.2 mg/L — ABNORMAL HIGH (ref 3.3–19.4)
Kappa, lambda light chain ratio: 6.34 — ABNORMAL HIGH (ref 0.26–1.65)
Lambda free light chains: 18.5 mg/L (ref 5.7–26.3)

## 2020-10-04 NOTE — Telephone Encounter (Signed)
Future order placed! Sorry!

## 2020-10-05 ENCOUNTER — Other Ambulatory Visit: Payer: Medicare Other

## 2020-10-06 ENCOUNTER — Other Ambulatory Visit: Payer: Self-pay | Admitting: Family Medicine

## 2020-10-06 DIAGNOSIS — E1122 Type 2 diabetes mellitus with diabetic chronic kidney disease: Secondary | ICD-10-CM | POA: Diagnosis not present

## 2020-10-06 DIAGNOSIS — E785 Hyperlipidemia, unspecified: Secondary | ICD-10-CM | POA: Diagnosis not present

## 2020-10-06 DIAGNOSIS — R809 Proteinuria, unspecified: Secondary | ICD-10-CM | POA: Diagnosis not present

## 2020-10-06 DIAGNOSIS — N181 Chronic kidney disease, stage 1: Secondary | ICD-10-CM | POA: Diagnosis not present

## 2020-10-06 DIAGNOSIS — I129 Hypertensive chronic kidney disease with stage 1 through stage 4 chronic kidney disease, or unspecified chronic kidney disease: Secondary | ICD-10-CM | POA: Diagnosis not present

## 2020-10-06 LAB — CUP PACEART REMOTE DEVICE CHECK
Battery Remaining Longevity: 73 mo
Battery Remaining Percentage: 95.5 %
Battery Voltage: 2.99 V
Brady Statistic AP VP Percent: 20 %
Brady Statistic AP VS Percent: 1 %
Brady Statistic AS VP Percent: 79 %
Brady Statistic AS VS Percent: 1 %
Brady Statistic RA Percent Paced: 19 %
Date Time Interrogation Session: 20211019224147
Implantable Lead Implant Date: 20160822
Implantable Lead Implant Date: 20160822
Implantable Lead Implant Date: 20190118
Implantable Lead Location: 753858
Implantable Lead Location: 753859
Implantable Lead Location: 753860
Implantable Pulse Generator Implant Date: 20190118
Lead Channel Impedance Value: 1050 Ohm
Lead Channel Impedance Value: 430 Ohm
Lead Channel Impedance Value: 540 Ohm
Lead Channel Pacing Threshold Amplitude: 1.125 V
Lead Channel Pacing Threshold Amplitude: 1.625 V
Lead Channel Pacing Threshold Amplitude: 2.375 V
Lead Channel Pacing Threshold Pulse Width: 0.5 ms
Lead Channel Pacing Threshold Pulse Width: 0.5 ms
Lead Channel Pacing Threshold Pulse Width: 0.5 ms
Lead Channel Sensing Intrinsic Amplitude: 1.5 mV
Lead Channel Sensing Intrinsic Amplitude: 12 mV
Lead Channel Setting Pacing Amplitude: 2.125
Lead Channel Setting Pacing Amplitude: 2.625
Lead Channel Setting Pacing Amplitude: 4.375
Lead Channel Setting Pacing Pulse Width: 0.5 ms
Lead Channel Setting Pacing Pulse Width: 0.5 ms
Lead Channel Setting Sensing Sensitivity: 4 mV
Pulse Gen Model: 3262
Pulse Gen Serial Number: 8983138

## 2020-10-06 LAB — PROTEIN ELECTROPHORESIS, SERUM, WITH REFLEX
A/G Ratio: 0.9 (ref 0.7–1.7)
Albumin ELP: 3.3 g/dL (ref 2.9–4.4)
Alpha-1-Globulin: 0.2 g/dL (ref 0.0–0.4)
Alpha-2-Globulin: 0.8 g/dL (ref 0.4–1.0)
Beta Globulin: 0.9 g/dL (ref 0.7–1.3)
Gamma Globulin: 1.9 g/dL — ABNORMAL HIGH (ref 0.4–1.8)
Globulin, Total: 3.8 g/dL (ref 2.2–3.9)
M-Spike, %: 1.5 g/dL — ABNORMAL HIGH
SPEP Interpretation: 0
Total Protein ELP: 7.1 g/dL (ref 6.0–8.5)

## 2020-10-06 LAB — IMMUNOFIXATION REFLEX, SERUM
IgA: 305 mg/dL (ref 61–437)
IgG (Immunoglobin G), Serum: 2682 mg/dL — ABNORMAL HIGH (ref 603–1613)
IgM (Immunoglobulin M), Srm: 42 mg/dL (ref 15–143)

## 2020-10-07 ENCOUNTER — Ambulatory Visit (INDEPENDENT_AMBULATORY_CARE_PROVIDER_SITE_OTHER): Payer: Medicare Other

## 2020-10-07 DIAGNOSIS — I441 Atrioventricular block, second degree: Secondary | ICD-10-CM

## 2020-10-12 ENCOUNTER — Other Ambulatory Visit: Payer: Self-pay

## 2020-10-12 ENCOUNTER — Other Ambulatory Visit: Payer: Medicare Other

## 2020-10-12 DIAGNOSIS — E0829 Diabetes mellitus due to underlying condition with other diabetic kidney complication: Secondary | ICD-10-CM | POA: Diagnosis not present

## 2020-10-13 ENCOUNTER — Encounter: Payer: Self-pay | Admitting: Family Medicine

## 2020-10-13 LAB — HEMOGLOBIN A1C
Hgb A1c MFr Bld: 7.6 % of total Hgb — ABNORMAL HIGH (ref <5.7)
Mean Plasma Glucose: 171 (calc)
eAG (mmol/L): 9.5 (calc)

## 2020-10-13 NOTE — Progress Notes (Signed)
Remote pacemaker transmission.   

## 2020-10-18 ENCOUNTER — Other Ambulatory Visit: Payer: Self-pay

## 2020-10-18 MED ORDER — ATORVASTATIN CALCIUM 20 MG PO TABS: 20.0000 mg | ORAL_TABLET | Freq: Every day | ORAL | 3 refills | Status: DC

## 2020-11-29 ENCOUNTER — Other Ambulatory Visit: Payer: Self-pay | Admitting: Internal Medicine

## 2020-12-08 DIAGNOSIS — H34811 Central retinal vein occlusion, right eye, with macular edema: Secondary | ICD-10-CM | POA: Diagnosis not present

## 2020-12-17 ENCOUNTER — Other Ambulatory Visit: Payer: Self-pay | Admitting: Family Medicine

## 2021-01-02 NOTE — Progress Notes (Deleted)
[facility] [address], [place], [place] [address] [phone]  Date:  01/05/2021   Name:  Michael Adachi Sr.   DOB:  Mar 14, 1946   MRN:  [id number]  PCP:  Darreld Mclean, MD    Chief Complaint: No chief complaint on file.   History of Present Illness:  Michael Kucinski Sr. is a 75 y.o. very pleasant male patient who presents with the following:  Patient here today for 50-monthfollow-up visit- history of diabetes, proteinuria, chronic kidney disease, CHF, hypertension, Mobitz 2 heart block with pacemaker, hyperlipidemia, MGUS/multiple myeloma and iron deficiency anemia Last seen by myself in July  He is his cardiologist, Dr. TLovena Lein September Assess/Plan: 1. Chronic systolic heart failure - he is asymptomatic, s/p biv ICD. 2. ICD - his St. Jude Biv ICD is working normally. 3. CHB - he is asymptomatic s/p biv device 4. Hyponatremia - he admits to drinking 64-100 oz of fluid daily. I asked him to stop this.  His urologist is Dr. EJunious SilkHis nephrologist, Dr. WJustin Mendin October.  If plan for 650-monthollow-up  Seen by his oncologist, Dr. EnMarin Olpn October; he has IgG kappa smoldering myeloma, they are currently observing his disease  Lab Results  Component Value Date   HGBA1C 7.6 (H) 10/12/2020    Eye exam Flu vaccine COVID-19 vaccine Due for urine microalbumin Foot exam due Zostavax Most recent labs done in October per oncology Patient Active Problem List   Diagnosis Date Noted  . Chronic kidney disease (CKD), active medical management without dialysis 10/09/2019  . Iron deficiency anemia due to chronic blood loss 10/02/2019  . Chronic systolic (congestive) heart failure (HCColumbiana01/18/2019  . Pacemaker 12/08/2015  . Mobitz type 2 second degree heart block 08/08/2015  . Right bundle branch block 12/16/2014  . Syncope 12/16/2014  . Proteinuria due to type 2 diabetes mellitus (HCHaivana Nakya11/26/2015  . Encounter  for therapeutic drug monitoring 03/13/2013  . Multiple myeloma (HCLytle03/12/2012  . Edema 01/07/2013  . Diabetes mellitus due to underlying condition with other diabetic kidney complication (HCSharon Springs  . Hyperlipidemia   . Hypertension   . MGUS (monoclonal gammopathy of unknown significance)   . GERD (gastroesophageal reflux disease)     Past Medical History:  Diagnosis Date  . Anemia   . Cataract   . Chronic systolic (congestive) heart failure (HCBendena  . Diabetes mellitus   . GERD (gastroesophageal reflux disease)   . Hyperlipidemia   . Hypertension   . Iron deficiency anemia due to chronic blood loss 10/02/2019  . MGUS (monoclonal gammopathy of unknown significance)   . Multiple myeloma (HCCorwith3/2014   normal cytogenetics and FISH panel on 03/11/2013.   . Osteoarthritis   . Presence of permanent cardiac pacemaker   . Right bundle branch block   . Syncope     Past Surgical History:  Procedure Laterality Date  . BIV UPGRADE  01/04/2018  . BIV UPGRADE N/A 01/04/2018   Procedure: BIVP UPGRADE;  Surgeon: TaEvans LanceMD;  [facility]: [facility];  Service: Cardiovascular;  Laterality: N/A;  . EP IMPLANTABLE DEVICE N/A 08/09/2015   Procedure: Pacemaker Implant;  Surgeon: GrEvans LanceMD;  Location: [facility];  Service: Cardiovascular;  Laterality: N/A;  . EYE SURGERY      Social History   Tobacco Use  . Smoking status: Former Smoker    Packs/day: 2.00  Years: 35.00    Pack years: 70.00    Types: Cigarettes    Start date: 03/26/1963    Quit date: 12/18/1997    Years since quitting: 23.0  . Smokeless tobacco: Never Used  . Tobacco comment: quit 15 years  Vaping Use  . Vaping Use: Never used  Substance Use Topics  . Alcohol use: No    Alcohol/week: 0.0 standard drinks  . Drug use: No    Family History  Problem Relation Age of Onset  . Hypertension Mother   . Cancer Mother   . Stroke Mother   . Hypertension Sister   . Hypertension Brother   .  Hypertension Maternal Grandmother     Allergies  Allergen Reactions  . Amlodipine Swelling  . Crestor [Rosuvastatin] Other (See Comments)    Per pt unable to focus    Medication list has been reviewed and updated.  Current Outpatient Medications on File Prior to Visit  Medication Sig Dispense Refill  . albuterol (PROVENTIL HFA;VENTOLIN HFA) 108 (90 Base) MCG/ACT inhaler Inhale 2 puffs into the lungs every 6 (six) hours as needed for wheezing or shortness of breath. 1 Inhaler 2  . ASPIRIN 81 PO Take 81 mg by mouth daily. 04/01/2020 Takes every other day.    Marland Kitchen atorvastatin (LIPITOR) 20 MG tablet Take 1 tablet (20 mg total) by mouth daily. 90 tablet 3  . carvedilol (COREG) 25 MG tablet Take 1.5 tablets (37.5 mg total) by mouth 2 (two) times daily with a meal. 270 tablet 3  . ferrous sulfate 325 (65 FE) MG tablet Take 325 mg by mouth every other day.    . Ginkgo Biloba (GINKOBA) 40 MG TABS Take 1 tablet by mouth daily.     Marland Kitchen glipiZIDE (GLUCOTROL XL) 5 MG 24 hr tablet Take 1 tablet (5 mg total) by mouth every morning. 90 tablet 3  . glucose blood test strip Use as instructed 100 each 12  . latanoprost (XALATAN) 0.005 % ophthalmic solution Place 1 drop into both eyes at bedtime.     . metFORMIN (GLUCOPHAGE) 1000 MG tablet Take 1 tablet (1,000 mg total) by mouth 2 (two) times daily with a meal. 180 tablet 0  . Multiple Vitamin (MULTIVITAMIN WITH MINERALS) TABS Take 1 tablet by mouth daily.     . Omega-3 Fatty Acids (FISH OIL BURP-LESS PO) Take 1 capsule by mouth every other day.     . sacubitril-valsartan (ENTRESTO) 49-51 MG Take 1 tablet by mouth in the morning. (Patient not taking: Reported on 10/01/2020) 30 tablet 5  . sacubitril-valsartan (ENTRESTO) 97-103 MG Take 1 tablet by mouth every evening.     No current facility-administered medications on file prior to visit.    Review of Systems:  As per HPI- otherwise negative.   Physical Examination: There were no vitals filed for this  visit. There were no vitals filed for this visit. There is no height or weight on file to calculate BMI. Ideal Body Weight:    GEN: no acute distress. HEENT: Atraumatic, Normocephalic.  Ears and Nose: No external deformity. CV: RRR, No M/G/R. No JVD. No thrill. No extra heart sounds. PULM: CTA B, no wheezes, crackles, rhonchi. No retractions. No resp. distress. No accessory muscle use. ABD: S, NT, ND, +BS. No rebound. No HSM. EXTR: No c/c/e PSYCH: Normally interactive. Conversant.    Assessment and Plan: *** This visit occurred during the SARS-CoV-2 public health emergency.  Safety protocols were in place, including screening questions prior to the visit,  additional usage of staff PPE, and extensive cleaning of exam room while observing appropriate contact time as indicated for disinfecting solutions.    Signed Lamar Blinks, MD

## 2021-01-02 NOTE — Patient Instructions (Incomplete)
It was great to see you again today, I will be in touch with your labs soon as possible  Please consider getting the shingles vaccine at your pharmacy if not done already

## 2021-01-05 ENCOUNTER — Ambulatory Visit: Payer: Medicare Other | Admitting: Family Medicine

## 2021-01-05 DIAGNOSIS — C9000 Multiple myeloma not having achieved remission: Secondary | ICD-10-CM

## 2021-01-05 DIAGNOSIS — I10 Essential (primary) hypertension: Secondary | ICD-10-CM

## 2021-01-05 DIAGNOSIS — E785 Hyperlipidemia, unspecified: Secondary | ICD-10-CM

## 2021-01-05 DIAGNOSIS — E0829 Diabetes mellitus due to underlying condition with other diabetic kidney complication: Secondary | ICD-10-CM

## 2021-01-05 DIAGNOSIS — C9001 Multiple myeloma in remission: Secondary | ICD-10-CM
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
## 2021-01-06 ENCOUNTER — Encounter: Payer: Self-pay | Admitting: Family Medicine

## 2021-01-06 ENCOUNTER — Other Ambulatory Visit: Payer: Self-pay

## 2021-01-06 ENCOUNTER — Ambulatory Visit (INDEPENDENT_AMBULATORY_CARE_PROVIDER_SITE_OTHER): Payer: Medicare Other

## 2021-01-06 ENCOUNTER — Ambulatory Visit (INDEPENDENT_AMBULATORY_CARE_PROVIDER_SITE_OTHER): Payer: Medicare Other | Admitting: Family Medicine

## 2021-01-06 ENCOUNTER — Ambulatory Visit (HOSPITAL_BASED_OUTPATIENT_CLINIC_OR_DEPARTMENT_OTHER)
Admission: RE | Admit: 2021-01-06 | Discharge: 2021-01-06 | Disposition: A | Discharge status: Home / Self Care | Payer: Medicare Other | Source: Ambulatory Visit | Attending: Family Medicine | Admitting: Family Medicine

## 2021-01-06 VITALS — BP 140/80 | HR 87 | Resp 17 | Ht 75.0 in | Wt 203.0 lb

## 2021-01-06 DIAGNOSIS — I441 Atrioventricular block, second degree: Secondary | ICD-10-CM | POA: Diagnosis not present

## 2021-01-06 DIAGNOSIS — M25562 Pain in left knee: Secondary | ICD-10-CM

## 2021-01-06 DIAGNOSIS — G8929 Other chronic pain: Secondary | ICD-10-CM | POA: Diagnosis not present

## 2021-01-06 DIAGNOSIS — C9000 Multiple myeloma not having achieved remission: Secondary | ICD-10-CM | POA: Diagnosis not present

## 2021-01-06 DIAGNOSIS — R059 Cough, unspecified: Secondary | ICD-10-CM | POA: Diagnosis not present

## 2021-01-06 DIAGNOSIS — E782 Mixed hyperlipidemia: Secondary | ICD-10-CM

## 2021-01-06 DIAGNOSIS — I10 Essential (primary) hypertension: Secondary | ICD-10-CM | POA: Diagnosis not present

## 2021-01-06 DIAGNOSIS — E0829 Diabetes mellitus due to underlying condition with other diabetic kidney complication: Secondary | ICD-10-CM

## 2021-01-06 LAB — BASIC METABOLIC PANEL
BUN: 23 mg/dL (ref 6–23)
CO2: 28 mEq/L (ref 19–32)
Calcium: 9.6 mg/dL (ref 8.4–10.5)
Chloride: 99 mEq/L (ref 96–112)
Creatinine, Ser: 1.43 mg/dL (ref 0.40–1.50)
GFR: 48.04 mL/min — ABNORMAL LOW (ref >60.00)
Glucose, Bld: 151 mg/dL — ABNORMAL HIGH (ref 70–99)
Potassium: 4.2 mEq/L (ref 3.5–5.1)
Sodium: 131 mEq/L — ABNORMAL LOW (ref 135–145)

## 2021-01-06 LAB — CUP PACEART REMOTE DEVICE CHECK
Battery Remaining Longevity: 73 mo
Battery Remaining Percentage: 95.5 %
Battery Voltage: 2.99 V
Brady Statistic AP VP Percent: 18 %
Brady Statistic AP VS Percent: 1 %
Brady Statistic AS VP Percent: 80 %
Brady Statistic AS VS Percent: 1 %
Brady Statistic RA Percent Paced: 16 %
Date Time Interrogation Session: 20220120040019
Implantable Lead Implant Date: 20160822
Implantable Lead Implant Date: 20160822
Implantable Lead Implant Date: 20190118
Implantable Lead Location: 753858
Implantable Lead Location: 753859
Implantable Lead Location: 753860
Implantable Pulse Generator Implant Date: 20190118
Lead Channel Impedance Value: 1050 Ohm
Lead Channel Impedance Value: 390 Ohm
Lead Channel Impedance Value: 530 Ohm
Lead Channel Pacing Threshold Amplitude: 1 V
Lead Channel Pacing Threshold Amplitude: 1.5 V
Lead Channel Pacing Threshold Amplitude: 3 V
Lead Channel Pacing Threshold Pulse Width: 0.5 ms
Lead Channel Pacing Threshold Pulse Width: 0.5 ms
Lead Channel Pacing Threshold Pulse Width: 0.5 ms
Lead Channel Sensing Intrinsic Amplitude: 1.2 mV
Lead Channel Sensing Intrinsic Amplitude: 12 mV
Lead Channel Setting Pacing Amplitude: 2 V
Lead Channel Setting Pacing Amplitude: 2.5 V
Lead Channel Setting Pacing Amplitude: 5 V
Lead Channel Setting Pacing Pulse Width: 0.5 ms
Lead Channel Setting Pacing Pulse Width: 0.5 ms
Lead Channel Setting Sensing Sensitivity: 4 mV
Pulse Gen Model: 3262
Pulse Gen Serial Number: 8983138

## 2021-01-06 LAB — MICROALBUMIN / CREATININE URINE RATIO
Creatinine,U: 37.5 mg/dL
Microalb Creat Ratio: 79.7 mg/g — ABNORMAL HIGH (ref 0.0–30.0)
Microalb, Ur: 29.8 mg/dL — ABNORMAL HIGH (ref 0.0–1.9)

## 2021-01-06 LAB — HEMOGLOBIN A1C: Hgb A1c MFr Bld: 7.9 % — ABNORMAL HIGH (ref 4.6–6.5)

## 2021-01-06 NOTE — Patient Instructions (Addendum)
It was good to see you again today-  I will be in touch with your labs asap  We will check your urine for protein, A1c and BMP today  Please do get your toenails done- a pedicure may be needed!   A foam roller can be a helpful tool to help with stretching   Please let me know if you have any concerns.  If all is well we can visit in 6 months

## 2021-01-11 ENCOUNTER — Other Ambulatory Visit: Payer: Self-pay | Admitting: Family Medicine

## 2021-01-12 ENCOUNTER — Telehealth: Payer: Self-pay | Admitting: Family Medicine

## 2021-01-12 NOTE — Telephone Encounter (Signed)
Patient called requesting his last lab results to be faxed to his kidney doctor. Patient sees :   [facility]- Dr Justin Mend  [address] [place], [place] [address] Telephone : [phone]

## 2021-01-12 NOTE — Telephone Encounter (Signed)
Labs faxed

## 2021-01-13 ENCOUNTER — Other Ambulatory Visit: Payer: Self-pay | Admitting: Internal Medicine

## 2021-01-17 ENCOUNTER — Other Ambulatory Visit: Payer: Self-pay | Admitting: Family Medicine

## 2021-01-19 NOTE — Progress Notes (Signed)
Remote pacemaker transmission.   

## 2021-01-20 ENCOUNTER — Other Ambulatory Visit: Payer: Self-pay | Admitting: Family Medicine

## 2021-01-21 ENCOUNTER — Other Ambulatory Visit: Payer: Self-pay | Admitting: *Deleted

## 2021-01-21 MED ORDER — ALBUTEROL SULFATE HFA 108 (90 BASE) MCG/ACT IN AERS: 2.0000 | INHALATION_SPRAY | Freq: Four times a day (QID) | RESPIRATORY_TRACT | 1 refills | Status: DC | PRN

## 2021-02-10 DIAGNOSIS — R3912 Poor urinary stream: Secondary | ICD-10-CM | POA: Diagnosis not present

## 2021-02-17 ENCOUNTER — Encounter: Payer: Self-pay | Admitting: Family Medicine

## 2021-02-21 DIAGNOSIS — L84 Corns and callosities: Secondary | ICD-10-CM | POA: Diagnosis not present

## 2021-02-21 DIAGNOSIS — M2021 Hallux rigidus, right foot: Secondary | ICD-10-CM | POA: Diagnosis not present

## 2021-02-21 DIAGNOSIS — R2689 Other abnormalities of gait and mobility: Secondary | ICD-10-CM | POA: Diagnosis not present

## 2021-02-21 DIAGNOSIS — L602 Onychogryphosis: Secondary | ICD-10-CM | POA: Diagnosis not present

## 2021-02-21 DIAGNOSIS — M2042 Other hammer toe(s) (acquired), left foot: Secondary | ICD-10-CM | POA: Diagnosis not present

## 2021-02-21 DIAGNOSIS — M2022 Hallux rigidus, left foot: Secondary | ICD-10-CM | POA: Diagnosis not present

## 2021-02-21 DIAGNOSIS — I739 Peripheral vascular disease, unspecified: Secondary | ICD-10-CM | POA: Diagnosis not present

## 2021-03-22 ENCOUNTER — Telehealth: Payer: Self-pay

## 2021-03-22 NOTE — Telephone Encounter (Signed)
I asked the patient to send a manual transmission with his home monitor. He agreed to send one in 30 minutes when he get home. I told him when the nurse see the transmission she will review it and give him a call back. I gave him verbal instructions on how to send the transmission. The patient verbalized understanding and thanked me for my help.

## 2021-03-22 NOTE — Telephone Encounter (Signed)
Transmission received.

## 2021-03-22 NOTE — Telephone Encounter (Signed)
Contacted patient. Patient's transmission showed some PVC's. Pacemaker showing normal device function at this time. Contacted patient and advised patient of transmission findings. Advised patient that if any alerts were received, that we would contact the patient. Patient verbalized understanding.

## 2021-04-04 ENCOUNTER — Inpatient Hospital Stay: Payer: Medicare Other | Attending: Hematology & Oncology

## 2021-04-04 ENCOUNTER — Encounter: Payer: Self-pay | Admitting: Hematology & Oncology

## 2021-04-04 ENCOUNTER — Other Ambulatory Visit: Payer: Self-pay

## 2021-04-04 ENCOUNTER — Telehealth: Payer: Self-pay

## 2021-04-04 ENCOUNTER — Inpatient Hospital Stay (HOSPITAL_BASED_OUTPATIENT_CLINIC_OR_DEPARTMENT_OTHER): Payer: Medicare Other | Admitting: Hematology & Oncology

## 2021-04-04 VITALS — BP 155/93 | HR 65 | Temp 97.8°F | Resp 16 | Wt 215.0 lb

## 2021-04-04 DIAGNOSIS — E119 Type 2 diabetes mellitus without complications: Secondary | ICD-10-CM | POA: Insufficient documentation

## 2021-04-04 DIAGNOSIS — E0829 Diabetes mellitus due to underlying condition with other diabetic kidney complication: Secondary | ICD-10-CM

## 2021-04-04 DIAGNOSIS — C9000 Multiple myeloma not having achieved remission: Secondary | ICD-10-CM | POA: Diagnosis not present

## 2021-04-04 DIAGNOSIS — Z7984 Long term (current) use of oral hypoglycemic drugs: Secondary | ICD-10-CM | POA: Insufficient documentation

## 2021-04-04 DIAGNOSIS — D509 Iron deficiency anemia, unspecified: Secondary | ICD-10-CM | POA: Insufficient documentation

## 2021-04-04 LAB — HEMOGLOBIN A1C
Hgb A1c MFr Bld: 6.9 % — ABNORMAL HIGH (ref 4.8–5.6)
Mean Plasma Glucose: 151.33 mg/dL

## 2021-04-04 LAB — CBC WITH DIFFERENTIAL (CANCER CENTER ONLY)
Abs Immature Granulocytes: 0.02 10*3/uL (ref 0.00–0.07)
Basophils Absolute: 0.1 10*3/uL (ref 0.0–0.1)
Basophils Relative: 1 %
Eosinophils Absolute: 0.6 10*3/uL — ABNORMAL HIGH (ref 0.0–0.5)
Eosinophils Relative: 9 %
HCT: 35.5 % — ABNORMAL LOW (ref 39.0–52.0)
Hemoglobin: 11.4 g/dL — ABNORMAL LOW (ref 13.0–17.0)
Immature Granulocytes: 0 %
Lymphocytes Relative: 26 %
Lymphs Abs: 1.7 10*3/uL (ref 0.7–4.0)
MCH: 29.8 pg (ref 26.0–34.0)
MCHC: 32.1 g/dL (ref 30.0–36.0)
MCV: 92.7 fL (ref 80.0–100.0)
Monocytes Absolute: 0.7 10*3/uL (ref 0.1–1.0)
Monocytes Relative: 10 %
Neutro Abs: 3.5 10*3/uL (ref 1.7–7.7)
Neutrophils Relative %: 54 %
Platelet Count: 181 10*3/uL (ref 150–400)
RBC: 3.83 MIL/uL — ABNORMAL LOW (ref 4.22–5.81)
RDW: 14.7 % (ref 11.5–15.5)
WBC Count: 6.6 10*3/uL (ref 4.0–10.5)
nRBC: 0 % (ref 0.0–0.2)

## 2021-04-04 LAB — CMP (CANCER CENTER ONLY)
ALT: 13 U/L (ref 0–44)
AST: 13 U/L — ABNORMAL LOW (ref 15–41)
Albumin: 3.9 g/dL (ref 3.5–5.0)
Alkaline Phosphatase: 43 U/L (ref 38–126)
Anion gap: 5 (ref 5–15)
BUN: 23 mg/dL (ref 8–23)
CO2: 29 mmol/L (ref 22–32)
Calcium: 9.5 mg/dL (ref 8.9–10.3)
Chloride: 101 mmol/L (ref 98–111)
Creatinine: 1.3 mg/dL — ABNORMAL HIGH (ref 0.61–1.24)
GFR, Estimated: 57 mL/min — ABNORMAL LOW (ref >60)
Glucose, Bld: 230 mg/dL — ABNORMAL HIGH (ref 70–99)
Potassium: 4.5 mmol/L (ref 3.5–5.1)
Sodium: 135 mmol/L (ref 135–145)
Total Bilirubin: 0.4 mg/dL (ref 0.3–1.2)
Total Protein: 7.5 g/dL (ref 6.5–8.1)

## 2021-04-04 LAB — IRON AND TIBC
Iron: 77 ug/dL (ref 42–163)
Saturation Ratios: 24 % (ref 20–55)
TIBC: 313 ug/dL (ref 202–409)
UIBC: 236 ug/dL (ref 117–376)

## 2021-04-04 LAB — FERRITIN: Ferritin: 62 ng/mL (ref 24–336)

## 2021-04-04 NOTE — Telephone Encounter (Signed)
S/w pt  Per 04/04/21 los and he is aware of his appts   Michael Wiggins

## 2021-04-04 NOTE — Progress Notes (Signed)
Hematology and Oncology Follow Up Visit  Michael Pedrosa Sr. [id number] [birth year] 75 y.o. 04/04/2021   Principle Diagnosis:   IgG kappa smoldering myeloma  Iron deficiency anemia  Current Therapy:    Observation     Interim History:  [name] is back for followup.  He is doing okay.  We saw him 6 months ago.  Since we last saw him, he has been doing all right.  He is still working.  He does have diabetes.  This is probably his biggest problem.  As far as the myeloma is concerned, back in October, his M spike was 1.5 g/dL.  His IgG level was 2300 mg/dL.  The Kappa light chain was 11.7 mg/dL.  He has had no obvious bleeding.  He has had no problems with pain.  He has had some ocular issues.  He probably has some retinal issues from his diabetes.  He has had no fever.  There is been no issues with respect to COVID.  Overall, I would say his performance status is ECOG 1.    Medications:  Current Outpatient Medications:  .  hydrochlorothiazide (HYDRODIURIL) 25 MG tablet, Take 0.5 tablets by mouth daily., Disp: , Rfl:  .  albuterol (PROAIR HFA) 108 (90 Base) MCG/ACT inhaler, Inhale 2 puffs into the lungs every 6 (six) hours as needed for wheezing or shortness of breath., Disp: 8.5 g, Rfl: 1 .  atorvastatin (LIPITOR) 20 MG tablet, Take 1 tablet (20 mg total) by mouth daily., Disp: 90 tablet, Rfl: 3 .  carvedilol (COREG) 25 MG tablet, Take 1.5 tablets (37.5 mg total) by mouth 2 (two) times daily with a meal., Disp: 270 tablet, Rfl: 3 .  ENTRESTO 97-103 MG, TAKE 1 TABLET BY MOUTH TWICE A DAY, Disp: 60 tablet, Rfl: 8 .  ferrous sulfate 325 (65 FE) MG tablet, Take 325 mg by mouth every other day., Disp: , Rfl:  .  Ginkgo Biloba (GINKOBA) 40 MG TABS, Take 1 tablet by mouth daily. , Disp: , Rfl:  .  glipiZIDE (GLUCOTROL XL) 5 MG 24 hr tablet, Take 1 tablet (5 mg total) by mouth every morning., Disp: 90 tablet, Rfl: 3 .  glucose blood test strip, Use as instructed, Disp: 100 each, Rfl:  12 .  latanoprost (XALATAN) 0.005 % ophthalmic solution, Place 1 drop into both eyes at bedtime. , Disp: , Rfl:  .  metFORMIN (GLUCOPHAGE) 1000 MG tablet, Take 1 tablet (1,000 mg total) by mouth 2 (two) times daily with a meal., Disp: 180 tablet, Rfl: 1 .  Multiple Vitamin (MULTIVITAMIN WITH MINERALS) TABS, Take 1 tablet by mouth daily. , Disp: , Rfl:  .  Omega-3 Fatty Acids (FISH OIL BURP-LESS PO), Take 1 capsule by mouth every other day. , Disp: , Rfl:   Allergies:  Allergies  Allergen Reactions  . Amlodipine Swelling  . Crestor [Rosuvastatin] Other (See Comments)    Per pt unable to focus    Past Medical History, Surgical history, Social history, and Family History were reviewed and updated.  Review of Systems: Review of Systems  Constitutional: Negative.   HENT: Negative.   Eyes: Negative.   Respiratory: Negative.   Cardiovascular: Negative.   Gastrointestinal: Negative.   Genitourinary: Negative.   Musculoskeletal: Negative.   Skin: Negative.   Neurological: Negative.   Endo/Heme/Allergies: Negative.   Psychiatric/Behavioral: Negative.      Physical Exam:  weight is 215 lb (97.5 kg). His oral temperature is 97.8 F (36.6 C). His blood pressure is 155/93 (  abnormal) and his pulse is 65. His respiration is 16 and oxygen saturation is 100%.   Physical Exam Vitals reviewed.  HENT:     Head: Normocephalic and atraumatic.  Eyes:     Pupils: Pupils are equal, round, and reactive to light.  Cardiovascular:     Rate and Rhythm: Normal rate and regular rhythm.     Heart sounds: Normal heart sounds.  Pulmonary:     Effort: Pulmonary effort is normal.     Breath sounds: Normal breath sounds.  Abdominal:     General: Bowel sounds are normal.     Palpations: Abdomen is soft.  Musculoskeletal:        General: No tenderness or deformity. Normal range of motion.     Cervical back: Normal range of motion.  Lymphadenopathy:     Cervical: No cervical adenopathy.  Skin:     General: Skin is warm and dry.     Findings: No erythema or rash.  Neurological:     Mental Status: He is alert and oriented to person, place, and time.  Psychiatric:        Behavior: Behavior normal.        Thought Content: Thought content normal.        Judgment: Judgment normal.       Lab Results  Component Value Date   WBC 6.6 04/04/2021   HGB 11.4 (L) 04/04/2021   HCT 35.5 (L) 04/04/2021   MCV 92.7 04/04/2021   PLT 181 04/04/2021     Chemistry      Component Value Date/Time   NA 135 04/04/2021 0934   NA 133 (L) 01/12/2020 1015   NA 142 10/03/2017 0910   NA 137 04/03/2017 0852   K 4.5 04/04/2021 0934   K 3.9 10/03/2017 0910   K 4.2 04/03/2017 0852   CL 101 04/04/2021 0934   CL 103 10/03/2017 0910   CL 108 (H) 05/15/2013 0912   CO2 29 04/04/2021 0934   CO2 28 10/03/2017 0910   CO2 26 04/03/2017 0852   BUN 23 04/04/2021 0934   BUN 25 01/12/2020 1015   BUN 13 10/03/2017 0910   BUN 16.3 04/03/2017 0852   CREATININE 1.30 (H) 04/04/2021 0934   CREATININE 1.07 11/16/2017 1437   CREATININE 1.0 04/03/2017 0852      Component Value Date/Time   CALCIUM 9.5 04/04/2021 0934   CALCIUM 9.3 10/03/2017 0910   CALCIUM 9.0 04/03/2017 0852   ALKPHOS 43 04/04/2021 0934   ALKPHOS 51 10/03/2017 0910   ALKPHOS 46 04/03/2017 0852   AST 13 (L) 04/04/2021 0934   AST 18 04/03/2017 0852   ALT 13 04/04/2021 0934   ALT 34 10/03/2017 0910   ALT 19 04/03/2017 0852   BILITOT 0.4 04/04/2021 0934   BILITOT 0.38 04/03/2017 0852     Impression and Plan: [name] is 75 year old gentleman with IgG kappa smoldering myeloma.  We will have to see what the monoclonal studies look like.  Hopefully, his M spike is not can be higher.  We will see what his iron studies show.  His hemoglobin is a little bit lower this time.  Again, we are dealing with quality of life issues for the most part.  I will still plan to see him back in 6 months.    Volanda Napoleon, MD 4/18/202211:28 AM

## 2021-04-05 LAB — IGG, IGA, IGM
IgA: 248 mg/dL (ref 61–437)
IgG (Immunoglobin G), Serum: 2198 mg/dL — ABNORMAL HIGH (ref 603–1613)
IgM (Immunoglobulin M), Srm: 20 mg/dL (ref 15–143)

## 2021-04-05 LAB — KAPPA/LAMBDA LIGHT CHAINS
Kappa free light chain: 109.1 mg/L — ABNORMAL HIGH (ref 3.3–19.4)
Kappa, lambda light chain ratio: 6.42 — ABNORMAL HIGH (ref 0.26–1.65)
Lambda free light chains: 17 mg/L (ref 5.7–26.3)

## 2021-04-06 DIAGNOSIS — H34811 Central retinal vein occlusion, right eye, with macular edema: Secondary | ICD-10-CM | POA: Diagnosis not present

## 2021-04-06 LAB — IMMUNOFIXATION REFLEX, SERUM
IgA: 253 mg/dL (ref 61–437)
IgG (Immunoglobin G), Serum: 2120 mg/dL — ABNORMAL HIGH (ref 603–1613)
IgM (Immunoglobulin M), Srm: 21 mg/dL (ref 15–143)

## 2021-04-06 LAB — PROTEIN ELECTROPHORESIS, SERUM, WITH REFLEX
A/G Ratio: 0.9 (ref 0.7–1.7)
Albumin ELP: 3.5 g/dL (ref 2.9–4.4)
Alpha-1-Globulin: 0.2 g/dL (ref 0.0–0.4)
Alpha-2-Globulin: 0.8 g/dL (ref 0.4–1.0)
Beta Globulin: 0.9 g/dL (ref 0.7–1.3)
Gamma Globulin: 1.9 g/dL — ABNORMAL HIGH (ref 0.4–1.8)
Globulin, Total: 3.8 g/dL (ref 2.2–3.9)
M-Spike, %: 1.4 g/dL — ABNORMAL HIGH
SPEP Interpretation: 0
Total Protein ELP: 7.3 g/dL (ref 6.0–8.5)

## 2021-04-07 ENCOUNTER — Encounter: Payer: Self-pay | Admitting: *Deleted

## 2021-04-07 ENCOUNTER — Ambulatory Visit (INDEPENDENT_AMBULATORY_CARE_PROVIDER_SITE_OTHER): Payer: Medicare Other

## 2021-04-07 DIAGNOSIS — I441 Atrioventricular block, second degree: Secondary | ICD-10-CM

## 2021-04-07 LAB — CUP PACEART REMOTE DEVICE CHECK
Battery Remaining Longevity: 79 mo
Battery Remaining Percentage: 95.5 %
Battery Voltage: 2.98 V
Brady Statistic AP VP Percent: 19 %
Brady Statistic AP VS Percent: 1 %
Brady Statistic AS VP Percent: 76 %
Brady Statistic AS VS Percent: 1.8 %
Brady Statistic RA Percent Paced: 16 %
Date Time Interrogation Session: 20220421040015
Implantable Lead Implant Date: 20160822
Implantable Lead Implant Date: 20160822
Implantable Lead Implant Date: 20190118
Implantable Lead Location: 753858
Implantable Lead Location: 753859
Implantable Lead Location: 753860
Implantable Pulse Generator Implant Date: 20190118
Lead Channel Impedance Value: 380 Ohm
Lead Channel Impedance Value: 510 Ohm
Lead Channel Impedance Value: 980 Ohm
Lead Channel Pacing Threshold Amplitude: 1.125 V
Lead Channel Pacing Threshold Amplitude: 1.75 V
Lead Channel Pacing Threshold Amplitude: 2.625 V
Lead Channel Pacing Threshold Pulse Width: 0.5 ms
Lead Channel Pacing Threshold Pulse Width: 0.5 ms
Lead Channel Pacing Threshold Pulse Width: 0.5 ms
Lead Channel Sensing Intrinsic Amplitude: 1 mV
Lead Channel Sensing Intrinsic Amplitude: 12 mV
Lead Channel Setting Pacing Amplitude: 2.125
Lead Channel Setting Pacing Amplitude: 2.75 V
Lead Channel Setting Pacing Amplitude: 4.625
Lead Channel Setting Pacing Pulse Width: 0.5 ms
Lead Channel Setting Pacing Pulse Width: 0.5 ms
Lead Channel Setting Sensing Sensitivity: 4 mV
Pulse Gen Model: 3262
Pulse Gen Serial Number: 8983138

## 2021-04-21 DIAGNOSIS — I70213 Atherosclerosis of native arteries of extremities with intermittent claudication, bilateral legs: Secondary | ICD-10-CM | POA: Diagnosis not present

## 2021-04-21 DIAGNOSIS — I70203 Unspecified atherosclerosis of native arteries of extremities, bilateral legs: Secondary | ICD-10-CM | POA: Diagnosis not present

## 2021-04-21 DIAGNOSIS — I739 Peripheral vascular disease, unspecified: Secondary | ICD-10-CM | POA: Diagnosis not present

## 2021-04-26 NOTE — Progress Notes (Signed)
Remote pacemaker transmission.   

## 2021-05-02 ENCOUNTER — Encounter: Payer: Self-pay | Admitting: Family Medicine

## 2021-05-02 DIAGNOSIS — I739 Peripheral vascular disease, unspecified: Secondary | ICD-10-CM

## 2021-05-02 DIAGNOSIS — B351 Tinea unguium: Secondary | ICD-10-CM | POA: Diagnosis not present

## 2021-05-02 DIAGNOSIS — E1151 Type 2 diabetes mellitus with diabetic peripheral angiopathy without gangrene: Secondary | ICD-10-CM | POA: Diagnosis not present

## 2021-05-03 DIAGNOSIS — D472 Monoclonal gammopathy: Secondary | ICD-10-CM | POA: Diagnosis not present

## 2021-05-03 DIAGNOSIS — N181 Chronic kidney disease, stage 1: Secondary | ICD-10-CM | POA: Diagnosis not present

## 2021-05-03 DIAGNOSIS — E785 Hyperlipidemia, unspecified: Secondary | ICD-10-CM | POA: Diagnosis not present

## 2021-05-03 DIAGNOSIS — R809 Proteinuria, unspecified: Secondary | ICD-10-CM | POA: Diagnosis not present

## 2021-05-03 DIAGNOSIS — I129 Hypertensive chronic kidney disease with stage 1 through stage 4 chronic kidney disease, or unspecified chronic kidney disease: Secondary | ICD-10-CM | POA: Diagnosis not present

## 2021-05-03 DIAGNOSIS — E1122 Type 2 diabetes mellitus with diabetic chronic kidney disease: Secondary | ICD-10-CM | POA: Diagnosis not present

## 2021-05-10 DIAGNOSIS — H2513 Age-related nuclear cataract, bilateral: Secondary | ICD-10-CM | POA: Diagnosis not present

## 2021-05-23 ENCOUNTER — Encounter (INDEPENDENT_AMBULATORY_CARE_PROVIDER_SITE_OTHER): Payer: Medicare Other | Admitting: Vascular Surgery

## 2021-05-23 ENCOUNTER — Telehealth: Payer: Self-pay | Admitting: Family Medicine

## 2021-05-23 DIAGNOSIS — I739 Peripheral vascular disease, unspecified: Secondary | ICD-10-CM

## 2021-05-23 NOTE — Telephone Encounter (Signed)
Pt would like a referral for [facility] [place], [place], [place] [address]   Please advice

## 2021-05-24 ENCOUNTER — Encounter (INDEPENDENT_AMBULATORY_CARE_PROVIDER_SITE_OTHER): Payer: Self-pay | Admitting: Vascular Surgery

## 2021-06-24 ENCOUNTER — Other Ambulatory Visit: Payer: Self-pay | Admitting: *Deleted

## 2021-06-24 DIAGNOSIS — I739 Peripheral vascular disease, unspecified: Secondary | ICD-10-CM

## 2021-06-27 DIAGNOSIS — H5319 Other subjective visual disturbances: Secondary | ICD-10-CM | POA: Diagnosis not present

## 2021-06-27 DIAGNOSIS — H34811 Central retinal vein occlusion, right eye, with macular edema: Secondary | ICD-10-CM | POA: Diagnosis not present

## 2021-07-03 NOTE — Progress Notes (Addendum)
[facility] [place], [place], [place] [address] [phone]  Date:  07/06/2021   Name:  Michael Falck Sr.   DOB:  December 04, 1946   MRN:  [id number]  PCP:  Darreld Mclean, MD    Chief Complaint: Diabetes (6 month follow up) and Chronic Kidney Disease   History of Present Illness:  Michael Seddon Sr. is a 75 y.o. very pleasant male patient who presents with the following:  Periodic follow-up visit today Last seen by myself in January - History of diabetes, proteinuria, chronic kidney disease, CHF, hypertension, Mobitz 2 heart block with pacemaker, hyperlipidemia, MGUS/multiple myeloma and iron deficiency anemia  He continues to work physically- about 6 hours a day  He also uses his treadmill for 20 minutes a day No CP or SOB  Seen by urology in February  Last hematology visit in April  He is seeing vascular this afternoon Quit smoking over 20 years ago  He did injure his leg on a metal piece about 2 weeks ago.  It cut his right shin - has healed up ok but he could use a tetanus booster   Lab Results  Component Value Date   HGBA1C 7.5 (H) 07/06/2021   Shingrix- recommended  Covid booster done  Labs done in April   Lipitor Coreg Delene Loll- he is taking ONCE a day Glipizide  metformin Hctz - NOT taking, will remove from med list  BP Readings from Last 3 Encounters:  07/06/21 (!) 140/91  07/06/21 (!) 152/90  04/04/21 (!) 155/93   He normally takes his BP meds after breakfast- but did not take yet today He does monitor his BP at home- he notes running a little higher in the am and then will go down in the afternoon He is actually only taking his Entresto once a day- he would be interested in trying a lower strength twice a day   Patient Active Problem List   Diagnosis Date Noted   Chronic kidney disease (CKD), active medical management without dialysis 10/09/2019   Iron deficiency anemia due to chronic  blood loss 37/03/8888   Chronic systolic (congestive) heart failure ([place]) 01/04/2018   Pacemaker 12/08/2015   Mobitz type 2 second degree heart block 08/08/2015   Right bundle branch block 12/16/2014   Syncope 12/16/2014   Proteinuria due to type 2 diabetes mellitus ([place]) 11/12/2014   Encounter for therapeutic drug monitoring 03/13/2013   Multiple myeloma ([place]) 02/15/2013   Edema 01/07/2013   Diabetes mellitus due to underlying condition with other diabetic kidney complication (HCC)    Hyperlipidemia    Hypertension    MGUS (monoclonal gammopathy of unknown significance)    GERD (gastroesophageal reflux disease)     Past Medical History:  Diagnosis Date   Anemia    Cataract    Chronic systolic (congestive) heart failure (HCC)    Diabetes mellitus    GERD (gastroesophageal reflux disease)    Hyperlipidemia    Hypertension    Iron deficiency anemia due to chronic blood loss 10/02/2019   MGUS (monoclonal gammopathy of unknown significance)    Multiple myeloma ([place]) 02/2013   normal cytogenetics and FISH panel on 03/11/2013.    Osteoarthritis    Presence of permanent cardiac pacemaker    Right bundle branch block    Syncope     Past Surgical History:  Procedure Laterality Date   BIV UPGRADE  01/04/2018   BIV UPGRADE N/A 01/04/2018  Procedure: BIVP UPGRADE;  Surgeon: [name], MD;  Location: [facility];  Service: Cardiovascular;  Laterality: N/A;   EP IMPLANTABLE DEVICE N/A 08/09/2015   Procedure: Pacemaker Implant;  Surgeon: [name], MD;  Location: [facility];  Service: Cardiovascular;  Laterality: N/A;   EYE SURGERY      Social History   Tobacco Use   Smoking status: Former    Packs/day: 2.00    Years: 35.00    Pack years: 70.00    Types: Cigarettes    Start date: 03/26/1963    Quit date: 12/18/1997    Years since quitting: 23.5   Smokeless tobacco: Never   Tobacco comments:    quit 15 years  Vaping Use   Vaping Use: Never used   Substance Use Topics   Alcohol use: No    Alcohol/week: 0.0 standard drinks   Drug use: No    Family History  Problem Relation Age of Onset   Hypertension Mother    Cancer Mother    Stroke Mother    Hypertension Sister    Hypertension Brother    Hypertension Maternal Grandmother     Allergies  Allergen Reactions   Amlodipine Swelling   Crestor [Rosuvastatin] Other (See Comments)    Per pt unable to focus    Medication list has been reviewed and updated.  Current Outpatient Medications on File Prior to Visit  Medication Sig Dispense Refill   albuterol (PROAIR HFA) 108 (90 Base) MCG/ACT inhaler Inhale 2 puffs into the lungs every 6 (six) hours as needed for wheezing or shortness of breath. 8.5 g 1   atorvastatin (LIPITOR) 20 MG tablet Take 1 tablet (20 mg total) by mouth daily. 90 tablet 3   carvedilol (COREG) 25 MG tablet Take 1.5 tablets (37.5 mg total) by mouth 2 (two) times daily with a meal. 270 tablet 3   ferrous sulfate 325 (65 FE) MG tablet Take 325 mg by mouth every other day.     Ginkgo Biloba 40 MG TABS Take 1 tablet by mouth daily.      glipiZIDE (GLUCOTROL XL) 5 MG 24 hr tablet Take 1 tablet (5 mg total) by mouth every morning. 90 tablet 3   glucose blood test strip Use as instructed 100 each 12   latanoprost (XALATAN) 0.005 % ophthalmic solution Place 1 drop into both eyes at bedtime.      metFORMIN (GLUCOPHAGE) 1000 MG tablet Take 1 tablet (1,000 mg total) by mouth 2 (two) times daily with a meal. 180 tablet 1   Multiple Vitamin (MULTIVITAMIN WITH MINERALS) TABS Take 1 tablet by mouth daily.      Omega-3 Fatty Acids (FISH OIL BURP-LESS PO) Take 1 capsule by mouth every other day.      No current facility-administered medications on file prior to visit.    Review of Systems:  As per HPI- otherwise negative.   Physical Examination: Vitals:   07/06/21 0834  BP: (!) 152/90  Pulse: 61  Resp: 17  Temp: 98 F (36.7 C)  SpO2: 99%   Vitals:   07/06/21  0834  Weight: 207 lb (93.9 kg)  Height: _0  (1.905 m)   Body mass index is 25.87 kg/m. Ideal Body Weight: Weight in (lb) to have BMI = 25: 199.6  GEN: no acute distress.  Normal weight, looks well  HEENT: Atraumatic, Normocephalic.  Ears and Nose: No external deformity. CV: RRR, No M/G/R. No JVD. No thrill. No extra heart sounds. PULM:  CTA B, no wheezes, crackles, rhonchi. No retractions. No resp. distress. No accessory muscle use. ABD: S, NT, ND, +BS. No rebound. No HSM. EXTR: No c/c/e PSYCH: Normally interactive. Conversant.  Right shin-well-healing wound is present on the right lateral shin.  It does not appear to be infected     Assessment and Plan: Type 2 diabetes mellitus with chronic kidney disease, without long-term current use of insulin, unspecified CKD stage ([place]) - Plan: Basic metabolic panel, Hemoglobin A1c  Wound of right lower extremity, initial encounter  Mobitz type 2 second degree heart block  Primary hypertension - Plan: sacubitril-valsartan (ENTRESTO) 49-51 MG  Chronic systolic (congestive) heart failure (HCC) - Plan: sacubitril-valsartan (ENTRESTO) 49-51 MG  Immunization due - Plan: Td vaccine greater than or equal to 7yo preservative free IM  Patient seen today for follow-up visit.  His blood pressure is somewhat high, he is taking his Entresto just once a day.  He feels that his afternoon blood pressure is too low if he takes it twice a day We will try using a lower dose of Entresto.  We will proceed to take it twice a day as intended  Tetanus booster bruising on leg  Will plan further follow- up pending labs.  This visit occurred during the SARS-CoV-2 public health emergency.  Safety protocols were in place, including screening questions prior to the visit, additional usage of staff PPE, and extensive cleaning of exam room while observing appropriate contact time as indicated for disinfecting solutions.   Signed Lamar Blinks, MD   Received  labs as below, message to patient  Results for orders placed or performed in visit on 76/28/31  Basic metabolic panel  Result Value Ref Range   Sodium 135 135 - 145 mEq/L   Potassium 4.1 3.5 - 5.1 mEq/L   Chloride 101 96 - 112 mEq/L   CO2 26 19 - 32 mEq/L   Glucose, Bld 123 (H) 70 - 99 mg/dL   BUN 22 6 - 23 mg/dL   Creatinine, Ser 1.26 0.40 - 1.50 mg/dL   GFR 55.72 (L) >60.00 mL/min   Calcium 9.3 8.4 - 10.5 mg/dL  Hemoglobin A1c  Result Value Ref Range   Hgb A1c MFr Bld 7.5 (H) 4.6 - 6.5 %

## 2021-07-03 NOTE — Patient Instructions (Addendum)
Good to see you again today!  Please see me in about 6 months  You got a tetanus booster today due to wound on your leg Your BP is a bit high- let's try changing your Entresto to a lower dose TWICE a day- this may work better for you I do recommend that you get the shingles vaccine series at your convenience - pharmacy

## 2021-07-06 ENCOUNTER — Encounter: Payer: Self-pay | Admitting: Family Medicine

## 2021-07-06 ENCOUNTER — Ambulatory Visit: Payer: Medicare Other | Admitting: Vascular Surgery

## 2021-07-06 ENCOUNTER — Ambulatory Visit (INDEPENDENT_AMBULATORY_CARE_PROVIDER_SITE_OTHER): Payer: Medicare Other | Admitting: Family Medicine

## 2021-07-06 ENCOUNTER — Encounter: Payer: Self-pay | Admitting: Vascular Surgery

## 2021-07-06 ENCOUNTER — Ambulatory Visit (HOSPITAL_COMMUNITY)
Admission: RE | Admit: 2021-07-06 | Discharge: 2021-07-06 | Disposition: A | Discharge status: Home / Self Care | Payer: Medicare Other | Source: Ambulatory Visit | Attending: Vascular Surgery | Admitting: Vascular Surgery

## 2021-07-06 ENCOUNTER — Other Ambulatory Visit: Payer: Self-pay

## 2021-07-06 VITALS — BP 140/91 | HR 56 | Temp 97.2°F | Resp 16 | Ht 74.0 in | Wt 206.3 lb

## 2021-07-06 VITALS — BP 152/90 | HR 61 | Temp 98.0°F | Resp 17 | Ht 75.0 in | Wt 207.0 lb

## 2021-07-06 DIAGNOSIS — E1122 Type 2 diabetes mellitus with diabetic chronic kidney disease: Secondary | ICD-10-CM | POA: Diagnosis not present

## 2021-07-06 DIAGNOSIS — N189 Chronic kidney disease, unspecified: Secondary | ICD-10-CM

## 2021-07-06 DIAGNOSIS — I10 Essential (primary) hypertension: Secondary | ICD-10-CM | POA: Diagnosis not present

## 2021-07-06 DIAGNOSIS — I739 Peripheral vascular disease, unspecified: Secondary | ICD-10-CM | POA: Insufficient documentation

## 2021-07-06 DIAGNOSIS — S81801A Unspecified open wound, right lower leg, initial encounter: Secondary | ICD-10-CM

## 2021-07-06 DIAGNOSIS — Z23 Encounter for immunization: Secondary | ICD-10-CM

## 2021-07-06 DIAGNOSIS — I5022 Chronic systolic (congestive) heart failure: Secondary | ICD-10-CM | POA: Diagnosis not present

## 2021-07-06 DIAGNOSIS — I441 Atrioventricular block, second degree: Secondary | ICD-10-CM

## 2021-07-06 DIAGNOSIS — E0829 Diabetes mellitus due to underlying condition with other diabetic kidney complication: Secondary | ICD-10-CM

## 2021-07-06 LAB — BASIC METABOLIC PANEL
BUN: 22 mg/dL (ref 6–23)
CO2: 26 mEq/L (ref 19–32)
Calcium: 9.3 mg/dL (ref 8.4–10.5)
Chloride: 101 mEq/L (ref 96–112)
Creatinine, Ser: 1.26 mg/dL (ref 0.40–1.50)
GFR: 55.72 mL/min — ABNORMAL LOW (ref >60.00)
Glucose, Bld: 123 mg/dL — ABNORMAL HIGH (ref 70–99)
Potassium: 4.1 mEq/L (ref 3.5–5.1)
Sodium: 135 mEq/L (ref 135–145)

## 2021-07-06 LAB — HEMOGLOBIN A1C: Hgb A1c MFr Bld: 7.5 % — ABNORMAL HIGH (ref 4.6–6.5)

## 2021-07-06 MED ORDER — SACUBITRIL-VALSARTAN 49-51 MG PO TABS
1.0000 | ORAL_TABLET | Freq: Two times a day (BID) | ORAL | 3 refills | Status: DC
Start: 2021-07-06 — End: 2022-07-11

## 2021-07-06 NOTE — Progress Notes (Signed)
ASSESSMENT & PLAN   PERIPHERAL VASCULAR DISEASE: Based on his Doppler studies he has evidence of mild infrainguinal arterial occlusive disease bilaterally.  However I do not think his symptoms in his legs can be attributed to peripheral vascular disease.  He is not a smoker.  He walks on the treadmill every day.  I would not recommend any further work-up unless he developed new onset claudication, rest pain, or nonhealing ulcer.  I have encouraged him to stay as active as possible.  We also discussed the importance of nutrition.  He is on a statin.  If he develops any new vascular symptoms and I would be happy to see him back at any time.  However I do not think he needs a routine follow-up study or arterial studies unless something changes.  REASON FOR CONSULT:    Peripheral vascular disease.  The consult is requested by Dr. Shanda Bumps [name]  HPI:   Michael Moomaw Sr. is a 75 y.o. male who was referred with peripheral vascular disease.  He apparently had had some pain in the left leg when walking that was mostly in the knee.  This prompted an arterial Doppler study that showed evidence of mild peripheral arterial disease.  For this reason he was sent for vascular consultation.  On my history he describes some pain in the left knee with walking and also occasionally in the left hip which he thinks might be related to sciatica based on symptoms he had in the past.  I do not get any history of calf or thigh claudication.  I do not get any history of rest pain or nonhealing ulcers.  His risk factors for peripheral vascular disease include diabetes and a remote history of tobacco use.  He did tell me that he had a screening study for an abdominal aortic aneurysms at the [facility] a couple years ago which did not show any evidence of an abdominal aortic aneurysm.  Past Medical History:  Diagnosis Date   Anemia    Cataract    Chronic systolic (congestive) heart failure (HCC)    Diabetes mellitus    GERD  (gastroesophageal reflux disease)    Hyperlipidemia    Hypertension    Iron deficiency anemia due to chronic blood loss 10/02/2019   MGUS (monoclonal gammopathy of unknown significance)    Multiple myeloma (HCC) 02/2013   normal cytogenetics and FISH panel on 03/11/2013.    Osteoarthritis    Presence of permanent cardiac pacemaker    Right bundle branch block    Syncope     Family History  Problem Relation Age of Onset   Hypertension Mother    Cancer Mother    Stroke Mother    Hypertension Sister    Hypertension Brother    Hypertension Maternal Grandmother     SOCIAL HISTORY: Social History   Tobacco Use   Smoking status: Former    Packs/day: 2.00    Years: 35.00    Pack years: 70.00    Types: Cigarettes    Start date: 03/26/1963    Quit date: 12/18/1997    Years since quitting: 23.5   Smokeless tobacco: Never   Tobacco comments:    quit 15 years  Substance Use Topics   Alcohol use: No    Alcohol/week: 0.0 standard drinks    Allergies  Allergen Reactions   Amlodipine Swelling   Crestor [Rosuvastatin] Other (See Comments)    Per pt unable to focus    Current Outpatient Medications  Medication  Sig Dispense Refill   albuterol (PROAIR HFA) 108 (90 Base) MCG/ACT inhaler Inhale 2 puffs into the lungs every 6 (six) hours as needed for wheezing or shortness of breath. 8.5 g 1   atorvastatin (LIPITOR) 20 MG tablet Take 1 tablet (20 mg total) by mouth daily. 90 tablet 3   carvedilol (COREG) 25 MG tablet Take 1.5 tablets (37.5 mg total) by mouth 2 (two) times daily with a meal. 270 tablet 3   ferrous sulfate 325 (65 FE) MG tablet Take 325 mg by mouth every other day.     Ginkgo Biloba 40 MG TABS Take 1 tablet by mouth daily.      glipiZIDE (GLUCOTROL XL) 5 MG 24 hr tablet Take 1 tablet (5 mg total) by mouth every morning. 90 tablet 3   glucose blood test strip Use as instructed 100 each 12   latanoprost (XALATAN) 0.005 % ophthalmic solution Place 1 drop into both eyes at  bedtime.      metFORMIN (GLUCOPHAGE) 1000 MG tablet Take 1 tablet (1,000 mg total) by mouth 2 (two) times daily with a meal. 180 tablet 1   Multiple Vitamin (MULTIVITAMIN WITH MINERALS) TABS Take 1 tablet by mouth daily.      Omega-3 Fatty Acids (FISH OIL BURP-LESS PO) Take 1 capsule by mouth every other day.      sacubitril-valsartan (ENTRESTO) 49-51 MG Take 1 tablet by mouth 2 (two) times daily. 180 tablet 3   No current facility-administered medications for this visit.    REVIEW OF SYSTEMS:  $RemoveB'[X]'IFEEKLfl$  denotes positive finding, $RemoveBeforeDEI'[ ]'JljhBnEeiBLddNWA$  denotes negative finding Cardiac  Comments:  Chest pain or chest pressure:    Shortness of breath upon exertion:    Short of breath when lying flat:    Irregular heart rhythm:        Vascular    Pain in calf, thigh, or hip brought on by ambulation: x   Pain in feet at night that wakes you up from your sleep:     Blood clot in your veins:    Leg swelling:  x       Pulmonary    Oxygen at home:    Productive cough:     Wheezing:         Neurologic    Sudden weakness in arms or legs:     Sudden numbness in arms or legs:     Sudden onset of difficulty speaking or slurred speech:    Temporary loss of vision in one eye:     Problems with dizziness:         Gastrointestinal    Blood in stool:     Vomited blood:         Genitourinary    Burning when urinating:     Blood in urine:        Psychiatric    Major depression:         Hematologic    Bleeding problems:    Problems with blood clotting too easily:        Skin    Rashes or ulcers:        Constitutional    Fever or chills:    -  PHYSICAL EXAM:   Vitals:   07/06/21 1456  BP: (!) 140/91  Pulse: (!) 56  Resp: 16  Temp: (!) 97.2 F (36.2 C)  TempSrc: Temporal  SpO2: 97%  Weight: 206 lb 4.8 oz (93.6 kg)  Height: $Remove'6\' 2"'SoyoMys$  (1.88 m)   Body mass  index is 26.49 kg/m. GENERAL: The patient is a well-nourished male, in no acute distress. The vital signs are documented above. CARDIAC: There  is a regular rate and rhythm.  VASCULAR: I do not detect carotid bruits. He has palpable femoral, popliteal, and dorsalis pedis pulses bilaterally.  I cannot palpate posterior tibial pulses.  Both feet are warm and well-perfused. He has no significant lower extremity swelling. PULMONARY: There is good air exchange bilaterally without wheezing or rales. ABDOMEN: Soft and non-tender with normal pitched bowel sounds.  I do not palpate an aneurysm. MUSCULOSKELETAL: There are no major deformities. NEUROLOGIC: No focal weakness or paresthesias are detected. SKIN: There are no ulcers or rashes noted. PSYCHIATRIC: The patient has a normal affect.  DATA:    ARTERIAL DOPPLER STUDY: I have independently interpreted his arterial Doppler study today.  On the right side there is a biphasic dorsalis pedis and posterior tibial signal.  ABIs 88%.  Toe pressure 67 mmHg.  On the left side there is a biphasic dorsalis pedis and posterior tibial signal.  ABIs 74%.  Toe pressure 71 mmHg.  Deitra Mayo [facility]

## 2021-07-07 ENCOUNTER — Ambulatory Visit (INDEPENDENT_AMBULATORY_CARE_PROVIDER_SITE_OTHER): Payer: Medicare Other

## 2021-07-07 DIAGNOSIS — I441 Atrioventricular block, second degree: Secondary | ICD-10-CM

## 2021-07-07 LAB — CUP PACEART REMOTE DEVICE CHECK
Battery Remaining Longevity: 46 mo
Battery Remaining Percentage: 59 %
Battery Voltage: 2.98 V
Brady Statistic AP VP Percent: 22 %
Brady Statistic AP VS Percent: 1 %
Brady Statistic AS VP Percent: 74 %
Brady Statistic AS VS Percent: 1.5 %
Brady Statistic RA Percent Paced: 19 %
Date Time Interrogation Session: 20220721044411
Implantable Lead Implant Date: 20160822
Implantable Lead Implant Date: 20160822
Implantable Lead Implant Date: 20190118
Implantable Lead Location: 753858
Implantable Lead Location: 753859
Implantable Lead Location: 753860
Implantable Pulse Generator Implant Date: 20190118
Lead Channel Impedance Value: 1025 Ohm
Lead Channel Impedance Value: 400 Ohm
Lead Channel Impedance Value: 510 Ohm
Lead Channel Pacing Threshold Amplitude: 1.125 V
Lead Channel Pacing Threshold Amplitude: 1.875 V
Lead Channel Pacing Threshold Amplitude: 2.25 V
Lead Channel Pacing Threshold Pulse Width: 0.5 ms
Lead Channel Pacing Threshold Pulse Width: 0.5 ms
Lead Channel Pacing Threshold Pulse Width: 0.5 ms
Lead Channel Sensing Intrinsic Amplitude: 1.1 mV
Lead Channel Sensing Intrinsic Amplitude: 12 mV
Lead Channel Setting Pacing Amplitude: 2.125
Lead Channel Setting Pacing Amplitude: 2.875
Lead Channel Setting Pacing Amplitude: 3.75 V
Lead Channel Setting Pacing Pulse Width: 0.5 ms
Lead Channel Setting Pacing Pulse Width: 0.5 ms
Lead Channel Setting Sensing Sensitivity: 4 mV
Pulse Gen Model: 3262
Pulse Gen Serial Number: 8983138

## 2021-07-19 ENCOUNTER — Telehealth (INDEPENDENT_AMBULATORY_CARE_PROVIDER_SITE_OTHER): Payer: Medicare Other | Admitting: Internal Medicine

## 2021-07-19 ENCOUNTER — Encounter: Payer: Self-pay | Admitting: Internal Medicine

## 2021-07-19 VITALS — BP 110/74 | Temp 97.7°F | Ht 74.0 in | Wt 207.0 lb

## 2021-07-19 DIAGNOSIS — U071 COVID-19: Secondary | ICD-10-CM | POA: Diagnosis not present

## 2021-07-19 MED ORDER — MOLNUPIRAVIR EUA 200MG CAPSULE: 4.0000 | ORAL_CAPSULE | Freq: Two times a day (BID) | ORAL | 0 refills | Status: AC

## 2021-07-19 NOTE — Progress Notes (Signed)
Subjective:    Patient ID: Michael Ybarbo Sr., male    DOB: 04/23/1946, 75 y.o.   MRN: [id number]  DOS:  07/19/2021 Type of visit - description: Virtual Visit via Video Note  I connected with the above patient  by a video enabled telemedicine application and verified that I am speaking with the correct person using two identifiers.   THIS ENCOUNTER IS A VIRTUAL VISIT DUE TO COVID-19 - PATIENT WAS NOT SEEN IN THE OFFICE. PATIENT HAS CONSENTED TO VIRTUAL VISIT / TELEMEDICINE VISIT   Location of patient: home  Location of provider: office  Persons participating in the virtual visit: patient, provider   I discussed the limitations of evaluation and management by telemedicine and the availability of in person appointments. The patient expressed understanding and agreed to proceed.  Acute The patient went to a gathering last week, shortly after his wife tested positive for COVID. He has been taking care of her, using a consistently but nevertheless today he woke up with a headache, and a mild sore throat. He tested positive for COVID today.  Denies fever No nausea, vomiting.  Had diarrhea yesterday. No chest pain or difficulty breathing. Has some cough which is not something new to him.    Review of Systems See above   Past Medical History:  Diagnosis Date   Anemia    Cataract    Chronic systolic (congestive) heart failure (HCC)    Diabetes mellitus    GERD (gastroesophageal reflux disease)    Hyperlipidemia    Hypertension    Iron deficiency anemia due to chronic blood loss 10/02/2019   MGUS (monoclonal gammopathy of unknown significance)    Multiple myeloma ([place]) 02/2013   normal cytogenetics and FISH panel on 03/11/2013.    Osteoarthritis    Presence of permanent cardiac pacemaker    Right bundle branch block    Syncope     Past Surgical History:  Procedure Laterality Date   BIV UPGRADE  01/04/2018   BIV UPGRADE N/A 01/04/2018   Procedure: BIVP UPGRADE;  Surgeon:  [name], MD;  Location: [facility];  Service: Cardiovascular;  Laterality: N/A;   EP IMPLANTABLE DEVICE N/A 08/09/2015   Procedure: Pacemaker Implant;  Surgeon: [name], MD;  Location: [facility];  Service: Cardiovascular;  Laterality: N/A;   EYE SURGERY      Allergies as of 07/19/2021       Reactions   Amlodipine Swelling   Crestor [rosuvastatin] Other (See Comments)   Per pt unable to focus        Medication List        Accurate as of July 19, 2021 11:29 AM. If you have any questions, ask your nurse or doctor.          albuterol 108 (90 Base) MCG/ACT inhaler Commonly known as: ProAir HFA Inhale 2 puffs into the lungs every 6 (six) hours as needed for wheezing or shortness of breath.   atorvastatin 20 MG tablet Commonly known as: LIPITOR Take 1 tablet (20 mg total) by mouth daily.   carvedilol 25 MG tablet Commonly known as: COREG Take 1.5 tablets (37.5 mg total) by mouth 2 (two) times daily with a meal.   ferrous sulfate 325 (65 FE) MG tablet Take 325 mg by mouth every other day.   FISH OIL BURP-LESS PO Take 1 capsule by mouth every other day.   Ginkgo Biloba 40 MG Tabs Take 1 tablet by mouth daily.  glipiZIDE 5 MG 24 hr tablet Commonly known as: GLUCOTROL XL Take 1 tablet (5 mg total) by mouth every morning.   glucose blood test strip Use as instructed   latanoprost 0.005 % ophthalmic solution Commonly known as: XALATAN Place 1 drop into both eyes at bedtime.   metFORMIN 1000 MG tablet Commonly known as: GLUCOPHAGE Take 1 tablet (1,000 mg total) by mouth 2 (two) times daily with a meal.   multivitamin with minerals Tabs tablet Take 1 tablet by mouth daily.   sacubitril-valsartan 49-51 MG Commonly known as: ENTRESTO Take 1 tablet by mouth 2 (two) times daily.           Objective:   Physical Exam Ht $Remov'6\' 2"'GeaoYY$  (1.88 m)   Wt 207 lb (93.9 kg)   BMI 26.58 kg/m  This is a virtual video visit, he is alert oriented  x3, in no distress, speaking in complete sentences, moving around his house with no problems.    Assessment     75 year old gentleman, PMH includes high cholesterol, HTN, diabetes, multiple myeloma, CHF, pacemaker, presents with:  COVID-19: Symptoms a started today, they are mild, his wife was diagnosed with COVID as well few days ago. He had 4 COVID vaccinations, history of multiple myeloma on no treatment. Plan: Molnupiravir (informally discussed with hematology, that is a good choice, if he is not improving we could try Bebtelovimab in 2 to 3 days). Rest, fluids, Tylenol, Mucinex DM if needed Call if not not gradually better in the next few days Call anytime if symptoms severe Isolation for 10 days Patient verbalized understanding.      I discussed the assessment and treatment plan with the patient. The patient was provided an opportunity to ask questions and all were answered. The patient agreed with the plan and demonstrated an understanding of the instructions.   The patient was advised to call back or seek an in-person evaluation if the symptoms worsen or if the condition fails to improve as anticipated.

## 2021-07-29 NOTE — Progress Notes (Signed)
Remote pacemaker transmission.   

## 2021-08-01 ENCOUNTER — Telehealth: Payer: Self-pay | Admitting: Family Medicine

## 2021-08-01 NOTE — Telephone Encounter (Signed)
Left message for patient to call back and schedule Medicare Annual Wellness Visit (AWV) in office.   If not able to come in office, please offer to do virtually or by telephone.  Left office number and my jabber [phone].  Last AWV:11/03/2019  Please schedule at anytime with Nurse Health Advisor.

## 2021-08-04 ENCOUNTER — Encounter: Payer: Self-pay | Admitting: Family Medicine

## 2021-08-08 DIAGNOSIS — B351 Tinea unguium: Secondary | ICD-10-CM | POA: Diagnosis not present

## 2021-08-08 DIAGNOSIS — L84 Corns and callosities: Secondary | ICD-10-CM | POA: Diagnosis not present

## 2021-08-08 DIAGNOSIS — E1151 Type 2 diabetes mellitus with diabetic peripheral angiopathy without gangrene: Secondary | ICD-10-CM | POA: Diagnosis not present

## 2021-09-07 ENCOUNTER — Ambulatory Visit: Payer: Medicare Other | Admitting: Internal Medicine

## 2021-09-07 ENCOUNTER — Other Ambulatory Visit: Payer: Self-pay

## 2021-09-07 ENCOUNTER — Encounter: Payer: Self-pay | Admitting: Internal Medicine

## 2021-09-07 VITALS — BP 128/78 | HR 71 | Ht 74.0 in | Wt 202.8 lb

## 2021-09-07 DIAGNOSIS — I441 Atrioventricular block, second degree: Secondary | ICD-10-CM | POA: Diagnosis not present

## 2021-09-07 NOTE — Progress Notes (Signed)
HPI [name] returns today for ongoing evaluation of his PPM after undergoing Biv upgrade just over 3 years ago. In the interim, he has done well. He is back to working regularly. He works in [employer], production. No syncope. He admits to dietary indiscretion with sodium. He notes that his bp is improved. He feels well. He is exercising regularly. Allergies  Allergen Reactions   Amlodipine Swelling   Crestor [Rosuvastatin] Other (See Comments)    Per pt unable to focus     Current Outpatient Medications  Medication Sig Dispense Refill   albuterol (PROAIR HFA) 108 (90 Base) MCG/ACT inhaler Inhale 2 puffs into the lungs every 6 (six) hours as needed for wheezing or shortness of breath. 8.5 g 1   atorvastatin (LIPITOR) 20 MG tablet Take 1 tablet (20 mg total) by mouth daily. 90 tablet 3   carvedilol (COREG) 25 MG tablet Take 1.5 tablets (37.5 mg total) by mouth 2 (two) times daily with a meal. 270 tablet 3   ferrous sulfate 325 (65 FE) MG tablet Take 325 mg by mouth every other day.     Ginkgo Biloba 40 MG TABS Take 1 tablet by mouth daily.      glipiZIDE (GLUCOTROL XL) 5 MG 24 hr tablet Take 1 tablet (5 mg total) by mouth every morning. 90 tablet 3   glucose blood test strip Use as instructed 100 each 12   latanoprost (XALATAN) 0.005 % ophthalmic solution Place 1 drop into both eyes at bedtime.      metFORMIN (GLUCOPHAGE) 1000 MG tablet Take 1 tablet (1,000 mg total) by mouth 2 (two) times daily with a meal. 180 tablet 1   Multiple Vitamin (MULTIVITAMIN WITH MINERALS) TABS Take 1 tablet by mouth daily.      Omega-3 Fatty Acids (FISH OIL BURP-LESS PO) Take 1 capsule by mouth every other day.      sacubitril-valsartan (ENTRESTO) 49-51 MG Take 1 tablet by mouth 2 (two) times daily. 180 tablet 3   No current facility-administered medications for this visit.     Past Medical History:  Diagnosis Date   Anemia    Cataract    Chronic systolic (congestive) heart failure  (HCC)    Diabetes mellitus    GERD (gastroesophageal reflux disease)    Hyperlipidemia    Hypertension    Iron deficiency anemia due to chronic blood loss 10/02/2019   MGUS (monoclonal gammopathy of unknown significance)    Multiple myeloma ([place]) 02/2013   normal cytogenetics and FISH panel on 03/11/2013.    Osteoarthritis    Presence of permanent cardiac pacemaker    Right bundle branch block    Syncope     ROS:   All systems reviewed and negative except as noted in the HPI.   Past Surgical History:  Procedure Laterality Date   BIV UPGRADE  01/04/2018   BIV UPGRADE N/A 01/04/2018   Procedure: BIVP UPGRADE;  Surgeon: [name], MD;  Location: [facility];  Service: Cardiovascular;  Laterality: N/A;   EP IMPLANTABLE DEVICE N/A 08/09/2015   Procedure: Pacemaker Implant;  Surgeon: [name], MD;  Location: [facility];  Service: Cardiovascular;  Laterality: N/A;   EYE SURGERY       Family History  Problem Relation Age of Onset   Hypertension Mother    Cancer Mother    Stroke Mother    Hypertension Sister    Hypertension Brother    Hypertension Maternal  Grandmother      Social History   Socioeconomic History   Marital status: Married    Spouse name: Not on file   Number of children: 3   Years of education: Not on file   Highest education level: Not on file  Occupational History   Not on file  Tobacco Use   Smoking status: Former    Packs/day: 2.00    Years: 35.00    Pack years: 70.00    Types: Cigarettes    Start date: 03/26/1963    Quit date: 12/18/1997    Years since quitting: 23.7   Smokeless tobacco: Never   Tobacco comments:    quit 15 years  Vaping Use   Vaping Use: Never used  Substance and Sexual Activity   Alcohol use: No    Alcohol/week: 0.0 standard drinks   Drug use: No   Sexual activity: Yes    Birth control/protection: None  Other Topics Concern   Not on file  Social History Narrative   Married   Building  Maintenance   Social Determinants of Health   Financial Resource Strain: Not on file  Food Insecurity: Not on file  Transportation Needs: Not on file  Physical Activity: Not on file  Stress: Not on file  Social Connections: Not on file  Intimate Partner Violence: Not on file     BP 128/78   Pulse 71   Ht $R'6\' 2"'sT$  (1.88 m)   Wt 202 lb 12.8 oz (92 kg)   SpO2 98%   BMI 26.04 kg/m   Physical Exam:  Well appearing 75 yo man, NAD HEENT: Unremarkable Neck:  No JVD, no thyromegally Lymphatics:  No adenopathy Back:  No CVA tenderness Lungs:  Clear with no wheezes HEART:  Regular rate rhythm, no murmurs, no rubs, no clicks Abd:  soft, positive bowel sounds, no organomegally, no rebound, no guarding Ext:  2 plus pulses, no edema, no cyanosis, no clubbing Skin:  No rashes no nodules Neuro:  CN II through XII intact, motor grossly intact  EKG - nsr with biv pacing  DEVICE  Normal device function.  See PaceArt for details.   Assess/Plan:  1. Chronic systolic heart failure - he is asymptomatic, s/p biv ICD. 2. ICD - his [employer] Biv ICD is working normally. 3. CHB - he is asymptomatic s/p biv device 4. Hyponatremia - he has reduced his fluid intake. We will follow.    Michael Wiggins Michael Janis,MD

## 2021-09-07 NOTE — Patient Instructions (Signed)
Medication Instructions:  Your physician recommends that you continue on your current medications as directed. Please refer to the Current Medication list given to you today.  *If you need a refill on your cardiac medications before your next appointment, please call your pharmacy*   Lab Work: none If you have labs (blood work) drawn today and your tests are completely normal, you will receive your results only by: [place] (if you have MyChart) OR A paper copy in the mail If you have any lab test that is abnormal or we need to change your treatment, we will call you to review the results.   Testing/Procedures: none   Follow-Up: At [facility], you and your health needs are our priority.  As part of our continuing mission to provide you with exceptional heart care, we have created designated Provider Care Teams.  These Care Teams include your primary Cardiologist (physician) and Advanced Practice Providers (APPs -  Physician Assistants and Nurse Practitioners) who all work together to provide you with the care you need, when you need it.  We recommend signing up for the patient portal called "MyChart".  Sign up information is provided on this After Visit Summary.  MyChart is used to connect with patients for Virtual Visits (Telemedicine).  Patients are able to view lab/test results, encounter notes, upcoming appointments, etc.  Non-urgent messages can be sent to your provider as well.   To learn more about what you can do with MyChart, go to [email or web address].    Your next appointment:   1 year(s)  The format for your next appointment:   In Person  Provider:   Cristopher Peru, MD   Other Instructions

## 2021-09-09 DIAGNOSIS — H26491 Other secondary cataract, right eye: Secondary | ICD-10-CM | POA: Diagnosis not present

## 2021-09-09 DIAGNOSIS — H34811 Central retinal vein occlusion, right eye, with macular edema: Secondary | ICD-10-CM | POA: Diagnosis not present

## 2021-09-15 ENCOUNTER — Other Ambulatory Visit: Payer: Self-pay | Admitting: Family Medicine

## 2021-09-15 ENCOUNTER — Ambulatory Visit: Payer: Self-pay

## 2021-09-15 DIAGNOSIS — C9000 Multiple myeloma not having achieved remission: Secondary | ICD-10-CM

## 2021-09-28 DIAGNOSIS — H401131 Primary open-angle glaucoma, bilateral, mild stage: Secondary | ICD-10-CM | POA: Diagnosis not present

## 2021-10-04 ENCOUNTER — Inpatient Hospital Stay: Payer: Medicare Other | Attending: Hematology & Oncology

## 2021-10-04 ENCOUNTER — Encounter: Payer: Self-pay | Admitting: Hematology & Oncology

## 2021-10-04 ENCOUNTER — Inpatient Hospital Stay: Payer: Medicare Other | Admitting: Hematology & Oncology

## 2021-10-04 ENCOUNTER — Other Ambulatory Visit: Payer: Self-pay

## 2021-10-04 VITALS — BP 156/89 | HR 59 | Temp 97.8°F | Resp 18 | Wt 206.0 lb

## 2021-10-04 DIAGNOSIS — C9000 Multiple myeloma not having achieved remission: Secondary | ICD-10-CM

## 2021-10-04 DIAGNOSIS — Z7984 Long term (current) use of oral hypoglycemic drugs: Secondary | ICD-10-CM | POA: Diagnosis not present

## 2021-10-04 DIAGNOSIS — Z79899 Other long term (current) drug therapy: Secondary | ICD-10-CM | POA: Diagnosis not present

## 2021-10-04 DIAGNOSIS — D509 Iron deficiency anemia, unspecified: Secondary | ICD-10-CM | POA: Diagnosis not present

## 2021-10-04 DIAGNOSIS — E119 Type 2 diabetes mellitus without complications: Secondary | ICD-10-CM | POA: Diagnosis not present

## 2021-10-04 DIAGNOSIS — D472 Monoclonal gammopathy: Secondary | ICD-10-CM | POA: Insufficient documentation

## 2021-10-04 LAB — CBC WITH DIFFERENTIAL (CANCER CENTER ONLY)
Abs Immature Granulocytes: 0.01 10*3/uL (ref 0.00–0.07)
Basophils Absolute: 0 10*3/uL (ref 0.0–0.1)
Basophils Relative: 1 %
Eosinophils Absolute: 0.9 10*3/uL — ABNORMAL HIGH (ref 0.0–0.5)
Eosinophils Relative: 13 %
HCT: 37.6 % — ABNORMAL LOW (ref 39.0–52.0)
Hemoglobin: 12.3 g/dL — ABNORMAL LOW (ref 13.0–17.0)
Immature Granulocytes: 0 %
Lymphocytes Relative: 26 %
Lymphs Abs: 1.8 10*3/uL (ref 0.7–4.0)
MCH: 29.6 pg (ref 26.0–34.0)
MCHC: 32.7 g/dL (ref 30.0–36.0)
MCV: 90.4 fL (ref 80.0–100.0)
Monocytes Absolute: 0.6 10*3/uL (ref 0.1–1.0)
Monocytes Relative: 8 %
Neutro Abs: 3.6 10*3/uL (ref 1.7–7.7)
Neutrophils Relative %: 52 %
Platelet Count: 210 10*3/uL (ref 150–400)
RBC: 4.16 MIL/uL — ABNORMAL LOW (ref 4.22–5.81)
RDW: 14.3 % (ref 11.5–15.5)
WBC Count: 6.8 10*3/uL (ref 4.0–10.5)
nRBC: 0 % (ref 0.0–0.2)

## 2021-10-04 LAB — RETICULOCYTES
Immature Retic Fract: 12 % (ref 2.3–15.9)
RBC.: 4.13 MIL/uL — ABNORMAL LOW (ref 4.22–5.81)
Retic Count, Absolute: 52.5 10*3/uL (ref 19.0–186.0)
Retic Ct Pct: 1.3 % (ref 0.4–3.1)

## 2021-10-04 LAB — IRON AND TIBC
Iron: 78 ug/dL (ref 42–163)
Saturation Ratios: 26 % (ref 20–55)
TIBC: 303 ug/dL (ref 202–409)
UIBC: 224 ug/dL (ref 117–376)

## 2021-10-04 LAB — CMP (CANCER CENTER ONLY)
ALT: 18 U/L (ref 0–44)
AST: 17 U/L (ref 15–41)
Albumin: 4 g/dL (ref 3.5–5.0)
Alkaline Phosphatase: 50 U/L (ref 38–126)
Anion gap: 6 (ref 5–15)
BUN: 20 mg/dL (ref 8–23)
CO2: 29 mmol/L (ref 22–32)
Calcium: 10.4 mg/dL — ABNORMAL HIGH (ref 8.9–10.3)
Chloride: 101 mmol/L (ref 98–111)
Creatinine: 1.21 mg/dL (ref 0.61–1.24)
GFR, Estimated: >60 mL/min (ref >60)
Glucose, Bld: 145 mg/dL — ABNORMAL HIGH (ref 70–99)
Potassium: 4.4 mmol/L (ref 3.5–5.1)
Sodium: 136 mmol/L (ref 135–145)
Total Bilirubin: 0.5 mg/dL (ref 0.3–1.2)
Total Protein: 8 g/dL (ref 6.5–8.1)

## 2021-10-04 LAB — FERRITIN: Ferritin: 73 ng/mL (ref 24–336)

## 2021-10-04 NOTE — Progress Notes (Signed)
Hematology and Oncology Follow Up Visit  Per Beagley Sr. [id number] 07-Aug-1946 75 y.o. 10/04/2021   Principle Diagnosis:  IgG kappa smoldering myeloma Iron deficiency anemia -- Venofer given on 03/2020  Current Therapy:   Observation     Interim History:  [name] is back for followup.  We last saw him back in April.  Since then, he is doing pretty well.  He is trying to watch his diabetes.  I think this is probably his biggest health problem right now.  He has had no problems at work.  He would like to retire.  When we last saw him, his M spike was 1.4 g/dL.  The IgG level was 2150 mg/dL.  The Kappa light chain was 11 mg/dL.  He last got iron about a year ago in April.  When we last saw him, his iron studies showed a ferritin of 62 with an iron saturation of 24%.  He has had no problems with COVID.  He has had no change in bowel or bladder habits.  He has had no rashes.  There is been no leg swelling.  He has had vascular studies done on his legs.  He says that the pressure is lower in his left leg and right leg.  He does see a vascular surgeon.  The vascular surgeon did not think there was a problem and then did not need to see him again.  He has had no headache.  He has had no weight loss or weight gain.  Overall, his performance status is ECOG 0.    Medications:  Current Outpatient Medications:    atorvastatin (LIPITOR) 20 MG tablet, Take 1 tablet (20 mg total) by mouth daily., Disp: 90 tablet, Rfl: 3   carvedilol (COREG) 25 MG tablet, Take 1.5 tablets (37.5 mg total) by mouth 2 (two) times daily with a meal., Disp: 270 tablet, Rfl: 3   ferrous sulfate 325 (65 FE) MG tablet, Take 325 mg by mouth every other day., Disp: , Rfl:    Ginkgo Biloba 40 MG TABS, Take 1 tablet by mouth daily. , Disp: , Rfl:    glipiZIDE (GLUCOTROL XL) 5 MG 24 hr tablet, TAKE 1 TABLET BY MOUTH EVERY DAY IN THE MORNING, Disp: 90 tablet, Rfl: 1   glucose blood test strip, Use as instructed, Disp:  100 each, Rfl: 12   latanoprost (XALATAN) 0.005 % ophthalmic solution, Place 1 drop into both eyes at bedtime. , Disp: , Rfl:    metFORMIN (GLUCOPHAGE) 1000 MG tablet, Take 1 tablet (1,000 mg total) by mouth 2 (two) times daily with a meal., Disp: 180 tablet, Rfl: 1   Multiple Vitamin (MULTIVITAMIN WITH MINERALS) TABS, Take 1 tablet by mouth daily. , Disp: , Rfl:    Omega-3 Fatty Acids (FISH OIL BURP-LESS PO), Take 1 capsule by mouth every other day. , Disp: , Rfl:    sacubitril-valsartan (ENTRESTO) 49-51 MG, Take 1 tablet by mouth 2 (two) times daily., Disp: 180 tablet, Rfl: 3   albuterol (PROAIR HFA) 108 (90 Base) MCG/ACT inhaler, Inhale 2 puffs into the lungs every 6 (six) hours as needed for wheezing or shortness of breath. (Patient not taking: Reported on 10/04/2021), Disp: 8.5 g, Rfl: 1  Allergies:  Allergies  Allergen Reactions   Amlodipine Swelling   Crestor [Rosuvastatin] Other (See Comments)    Per pt unable to focus    Past Medical History, Surgical history, Social history, and Family History were reviewed and updated.  Review of Systems: Review  of Systems  Constitutional: Negative.   HENT: Negative.    Eyes: Negative.   Respiratory: Negative.    Cardiovascular: Negative.   Gastrointestinal: Negative.   Genitourinary: Negative.   Musculoskeletal: Negative.   Skin: Negative.   Neurological: Negative.   Endo/Heme/Allergies: Negative.   Psychiatric/Behavioral: Negative.      Physical Exam:  weight is 206 lb (93.4 kg). His oral temperature is 97.8 F (36.6 C). His blood pressure is 156/89 (abnormal) and his pulse is 59 (abnormal). His respiration is 18 and oxygen saturation is 100%.   Physical Exam Vitals reviewed.  HENT:     Head: Normocephalic and atraumatic.  Eyes:     Pupils: Pupils are equal, round, and reactive to light.  Cardiovascular:     Rate and Rhythm: Normal rate and regular rhythm.     Heart sounds: Normal heart sounds.  Pulmonary:     Effort:  Pulmonary effort is normal.     Breath sounds: Normal breath sounds.  Abdominal:     General: Bowel sounds are normal.     Palpations: Abdomen is soft.  Musculoskeletal:        General: No tenderness or deformity. Normal range of motion.     Cervical back: Normal range of motion.  Lymphadenopathy:     Cervical: No cervical adenopathy.  Skin:    General: Skin is warm and dry.     Findings: No erythema or rash.  Neurological:     Mental Status: He is alert and oriented to person, place, and time.  Psychiatric:        Behavior: Behavior normal.        Thought Content: Thought content normal.        Judgment: Judgment normal.      Lab Results  Component Value Date   WBC 6.8 10/04/2021   HGB 12.3 (L) 10/04/2021   HCT 37.6 (L) 10/04/2021   MCV 90.4 10/04/2021   PLT 210 10/04/2021     Chemistry      Component Value Date/Time   NA 136 10/04/2021 1009   NA 133 (L) 01/12/2020 1015   NA 142 10/03/2017 0910   NA 137 04/03/2017 0852   K 4.4 10/04/2021 1009   K 3.9 10/03/2017 0910   K 4.2 04/03/2017 0852   CL 101 10/04/2021 1009   CL 103 10/03/2017 0910   CL 108 (H) 05/15/2013 0912   CO2 29 10/04/2021 1009   CO2 28 10/03/2017 0910   CO2 26 04/03/2017 0852   BUN 20 10/04/2021 1009   BUN 25 01/12/2020 1015   BUN 13 10/03/2017 0910   BUN 16.3 04/03/2017 0852   CREATININE 1.21 10/04/2021 1009   CREATININE 1.07 11/16/2017 1437   CREATININE 1.0 04/03/2017 0852      Component Value Date/Time   CALCIUM 10.4 (H) 10/04/2021 1009   CALCIUM 9.3 10/03/2017 0910   CALCIUM 9.0 04/03/2017 0852   ALKPHOS 50 10/04/2021 1009   ALKPHOS 51 10/03/2017 0910   ALKPHOS 46 04/03/2017 0852   AST 17 10/04/2021 1009   AST 18 04/03/2017 0852   ALT 18 10/04/2021 1009   ALT 34 10/03/2017 0910   ALT 19 04/03/2017 0852   BILITOT 0.5 10/04/2021 1009   BILITOT 0.38 04/03/2017 0852     Impression and Plan: [name] is 75 year old gentleman with IgG kappa smoldering myeloma.  We have been  following this for quite a while.  So far, there really has not been a change in the levels.  He has not been symptomatic.  We will see what his iron studies show.  His hemoglobin is low but better.  Hopefully, his iron will be okay.  We will plan to get him back in another 7 months or so.  We will get him through the holidays and see him back next June.     Volanda Napoleon, MD 10/18/202211:14 AM

## 2021-10-05 LAB — KAPPA/LAMBDA LIGHT CHAINS
Kappa free light chain: 128.3 mg/L — ABNORMAL HIGH (ref 3.3–19.4)
Kappa, lambda light chain ratio: 6.82 — ABNORMAL HIGH (ref 0.26–1.65)
Lambda free light chains: 18.8 mg/L (ref 5.7–26.3)

## 2021-10-05 LAB — IGG, IGA, IGM
IgA: 280 mg/dL (ref 61–437)
IgG (Immunoglobin G), Serum: 2323 mg/dL — ABNORMAL HIGH (ref 603–1613)
IgM (Immunoglobulin M), Srm: 23 mg/dL (ref 15–143)

## 2021-10-06 ENCOUNTER — Ambulatory Visit (INDEPENDENT_AMBULATORY_CARE_PROVIDER_SITE_OTHER): Payer: Medicare Other

## 2021-10-06 DIAGNOSIS — I441 Atrioventricular block, second degree: Secondary | ICD-10-CM

## 2021-10-06 LAB — CUP PACEART REMOTE DEVICE CHECK
Battery Remaining Longevity: 52 mo
Battery Remaining Percentage: 55 %
Battery Voltage: 2.98 V
Brady Statistic AP VP Percent: 22 %
Brady Statistic AP VS Percent: 1 %
Brady Statistic AS VP Percent: 77 %
Brady Statistic AS VS Percent: 1 %
Brady Statistic RA Percent Paced: 21 %
Date Time Interrogation Session: 20221020052110
Implantable Lead Implant Date: 20160822
Implantable Lead Implant Date: 20160822
Implantable Lead Implant Date: 20190118
Implantable Lead Location: 753858
Implantable Lead Location: 753859
Implantable Lead Location: 753860
Implantable Pulse Generator Implant Date: 20190118
Lead Channel Impedance Value: 1050 Ohm
Lead Channel Impedance Value: 390 Ohm
Lead Channel Impedance Value: 510 Ohm
Lead Channel Pacing Threshold Amplitude: 1.125 V
Lead Channel Pacing Threshold Amplitude: 1.25 V
Lead Channel Pacing Threshold Amplitude: 1.5 V
Lead Channel Pacing Threshold Pulse Width: 0.5 ms
Lead Channel Pacing Threshold Pulse Width: 0.5 ms
Lead Channel Pacing Threshold Pulse Width: 0.5 ms
Lead Channel Sensing Intrinsic Amplitude: 1 mV
Lead Channel Sensing Intrinsic Amplitude: 12 mV
Lead Channel Setting Pacing Amplitude: 2.125
Lead Channel Setting Pacing Amplitude: 2.25 V
Lead Channel Setting Pacing Amplitude: 2.5 V
Lead Channel Setting Pacing Pulse Width: 0.5 ms
Lead Channel Setting Pacing Pulse Width: 0.5 ms
Lead Channel Setting Sensing Sensitivity: 4 mV
Pulse Gen Model: 3262
Pulse Gen Serial Number: 8983138

## 2021-10-11 LAB — PROTEIN ELECTROPHORESIS, SERUM, WITH REFLEX
A/G Ratio: 0.9 (ref 0.7–1.7)
Albumin ELP: 3.6 g/dL (ref 2.9–4.4)
Alpha-1-Globulin: 0.2 g/dL (ref 0.0–0.4)
Alpha-2-Globulin: 0.7 g/dL (ref 0.4–1.0)
Beta Globulin: 0.9 g/dL (ref 0.7–1.3)
Gamma Globulin: 2 g/dL — ABNORMAL HIGH (ref 0.4–1.8)
Globulin, Total: 3.8 g/dL (ref 2.2–3.9)
M-Spike, %: 1.4 g/dL — ABNORMAL HIGH
SPEP Interpretation: 0
Total Protein ELP: 7.4 g/dL (ref 6.0–8.5)

## 2021-10-11 LAB — IMMUNOFIXATION REFLEX, SERUM

## 2021-10-14 NOTE — Progress Notes (Signed)
Remote pacemaker transmission.   

## 2021-10-21 ENCOUNTER — Other Ambulatory Visit: Payer: Self-pay | Admitting: Family Medicine

## 2021-10-22 ENCOUNTER — Other Ambulatory Visit: Payer: Self-pay | Admitting: Internal Medicine

## 2021-11-08 DIAGNOSIS — E1151 Type 2 diabetes mellitus with diabetic peripheral angiopathy without gangrene: Secondary | ICD-10-CM | POA: Diagnosis not present

## 2021-11-08 DIAGNOSIS — M2022 Hallux rigidus, left foot: Secondary | ICD-10-CM | POA: Diagnosis not present

## 2021-11-08 DIAGNOSIS — B351 Tinea unguium: Secondary | ICD-10-CM | POA: Diagnosis not present

## 2021-11-18 NOTE — Progress Notes (Signed)
Subjective:   Michael [place] Sr. is a 75 y.o. male who presents for Medicare Annual/Subsequent preventive examination.   Review of Systems     Cardiac Risk Factors include: advanced age (>44mn, >>79women);male gender;hypertension;diabetes mellitus;dyslipidemia     Objective:    Today's Vitals   11/21/21 1018 11/21/21 1021 11/21/21 1049  BP: (!) 169/93  (!) 160/91  Pulse: 60    Resp: 16    Temp: (!) 97.1 F (36.2 C)    TempSrc: Temporal    SpO2: 99%    Weight: 210 lb 9.6 oz (95.5 kg)    Height: _0  (1.905 m)    PainSc:  2     Body mass index is 26.32 kg/m.  Advanced Directives 11/21/2021 10/04/2021 04/04/2021 07/02/2020 04/08/2020 04/01/2020 11/03/2019  Does Patient Have a Medical Advance Directive? _1  No No  Does patient want to make changes to medical advance directive? - - - No - Patient declined - - No - Patient declined  Would patient like information on creating a medical advance directive? No - Patient declined No - Patient declined No - Patient declined - No - Patient declined No - Patient declined -    Current Medications (verified) Outpatient Encounter Medications as of 11/21/2021  Medication Sig   atorvastatin (LIPITOR) 20 MG tablet TAKE 1 TABLET BY MOUTH EVERY DAY   carvedilol (COREG) 25 MG tablet Take 1.5 tablets (37.5 mg total) by mouth 2 (two) times daily with a meal.   ferrous sulfate 325 (65 FE) MG tablet Take 325 mg by mouth every other day.   Ginkgo Biloba 40 MG TABS Take 1 tablet by mouth daily.    glipiZIDE (GLUCOTROL XL) 5 MG 24 hr tablet TAKE 1 TABLET BY MOUTH EVERY DAY IN THE MORNING   glucose blood test strip Use as instructed   latanoprost (XALATAN) 0.005 % ophthalmic solution Place 1 drop into both eyes at bedtime.    metFORMIN (GLUCOPHAGE) 1000 MG tablet TAKE 1 TABLET (1,000 MG TOTAL) BY MOUTH 2 (TWO) TIMES DAILY WITH A MEAL.   Multiple Vitamin (MULTIVITAMIN WITH MINERALS) TABS Take 1 tablet by mouth daily.    Omega-3 Fatty Acids  (FISH OIL BURP-LESS PO) Take 1 capsule by mouth every other day.    sacubitril-valsartan (ENTRESTO) 49-51 MG Take 1 tablet by mouth 2 (two) times daily.   albuterol (PROAIR HFA) 108 (90 Base) MCG/ACT inhaler Inhale 2 puffs into the lungs every 6 (six) hours as needed for wheezing or shortness of breath. (Patient not taking: Reported on 10/04/2021)   No facility-administered encounter medications on file as of 11/21/2021.    Allergies (verified) Amlodipine and Crestor [rosuvastatin]   History: Past Medical History:  Diagnosis Date   Anemia    Cataract    Chronic systolic (congestive) heart failure (HCC)    Diabetes mellitus    GERD (gastroesophageal reflux disease)    Hyperlipidemia    Hypertension    Iron deficiency anemia due to chronic blood loss 10/02/2019   MGUS (monoclonal gammopathy of unknown significance)    [place]   normal cytogenetics and FISH panel on 03/11/2013.    Osteoarthritis    Presence of permanent cardiac pacemaker    Right bundle branch block    Syncope    Past Surgical History:  Procedure Laterality Date   BIV UPGRADE  01/04/2018   BIV UPGRADE N/A 01/04/2018   Procedure: BIVP UPGRADE;  Surgeon: TEvans Lance MD;  [facility];  Service: Cardiovascular;  Laterality: N/A;   EP IMPLANTABLE DEVICE N/A 08/09/2015   Procedure: Pacemaker Implant;  Surgeon: [name], MD;  Location: [place] CV LAB;  Service: Cardiovascular;  Laterality: N/A;   EYE SURGERY     Family History  Problem Relation Age of Onset   Hypertension Mother    Cancer Mother    Stroke Mother    Hypertension Sister    Hypertension Brother    Hypertension Maternal Grandmother    Social History   Socioeconomic History   Marital status: Married    Spouse name: Not on file   Number of children: 3   Years of education: Not on file   Highest education level: Not on file  Occupational History   Not on file  Tobacco Use   Smoking status:  Former    Packs/day: 2.00    Years: 35.00    Pack years: 70.00    Types: Cigarettes    Start date: 03/26/1963    Quit date: 12/18/1997    Years since quitting: 23.9   Smokeless tobacco: Never   Tobacco comments:    quit 15 years  Vaping Use   Vaping Use: Never used  Substance and Sexual Activity   Alcohol use: No    Alcohol/week: 0.0 standard drinks   Drug use: No   Sexual activity: Yes    Birth control/protection: None  Other Topics Concern   Not on file  Social History Narrative   Married   Building Maintenance   Social Determinants of Health   Financial Resource Strain: Low Risk    Difficulty of Paying Living Expenses: Not hard at all  Food Insecurity: No Food Insecurity   Worried About Charity fundraiser in the Last Year: Never true   Ran Out of Food in the Last Year: Never true  Transportation Needs: No Transportation Needs   Lack of Transportation (Medical): No   Lack of Transportation (Non-Medical): No  Physical Activity: Insufficiently Active   Days of Exercise per Week: 7 days   Minutes of Exercise per Session: 20 min  Stress: No Stress Concern Present   Feeling of Stress : Not at all  Social Connections: Moderately Integrated   Frequency of Communication with Friends and Family: More than three times a week   Frequency of Social Gatherings with Friends and Family: More than three times a week   Attends Religious Services: More than 4 times per year   Active Member of [employer] or Organizations: No   Attends Music therapist: Never   Marital Status: Married    Tobacco Counseling Counseling given: Not Answered Tobacco comments: quit 15 years   Clinical Intake:  Pre-visit preparation completed: Yes  Pain : 0-10 Pain Score: 2  Pain Type: Chronic pain Pain Location: Toe (Comment which one) Pain Orientation: Right, Left (left is worse) Pain Onset: More than a month ago Pain Frequency: Constant Pain Relieving Factors: sees a Podiatrist  Pain  Relieving Factors: sees a Podiatrist  BMI - recorded: 26.32 Nutritional Status: BMI 25 -29 Overweight Nutritional Risks: None Diabetes: Yes CBG done?: No Did pt. bring in CBG monitor from home?: No  How often do you need to have someone help you when you read instructions, pamphlets, or other written materials from your doctor or pharmacy?: 1 - Never  Diabetes:  Is the patient diabetic?  Yes  If diabetic, was a CBG obtained today?  No  Did the patient bring in  their glucometer from home?  No  How often do you monitor your CBG's? daily.   Financial Strains and Diabetes Management:  Are you having any financial strains with the device, your supplies or your medication? No .  Does the patient want to be seen by Chronic Care Management for management of their diabetes?  No  Would the patient like to be referred to a Nutritionist or for Diabetic Management?  No   Diabetic Exams:  Diabetic Eye Exam: Completed 12/08/2020.   Diabetic Foot Exam: Completed 01/06/2021.    Interpreter Needed?: No  Information entered by :: Caroleen Hamman LPN   Activities of Daily Living In your present state of health, do you have any difficulty performing the following activities: 11/21/2021 07/19/2021  Hearing? N N  Vision? N N  Difficulty concentrating or making decisions? N N  Walking or climbing stairs? N N  Dressing or bathing? N N  Doing errands, shopping? N N  Preparing Food and eating ? N -  Using the Toilet? N -  In the past six months, have you accidently leaked urine? N -  Do you have problems with loss of bowel control? N -  Managing your Medications? N -  Managing your Finances? N -  Housekeeping or managing your Housekeeping? N -  Some recent data might be hidden    Patient Care Team: [name], [name], MD as PCP - General (Family Medicine) Edrick Oh, MD (Nephrology) Macarthur Critchley, OD as Consulting Physician (Optometry) Marin Olp, Rudell Cobb, MD as Consulting Physician  (Oncology) [name], MD as Consulting Physician (Cardiology)  Indicate any recent Medical Services you may have received from other than [facility] providers in the past year (date may be approximate).     Assessment:   This is a routine wellness examination for [employer].  Hearing/Vision screen Hearing Screening - Comments:: No issues Vision Screening - Comments:: Wears glasses Last eye exam-12/08/2020-Dr. Nicki Reaper  Dietary issues and exercise activities discussed: Current Exercise Habits: Home exercise routine, Type of exercise: treadmill, Time (Minutes): 20, Frequency (Times/Week): 7, Weekly Exercise (Minutes/Week): 140, Intensity: Mild, Exercise limited by: None identified   Goals Addressed   None    Depression Screen PHQ 2/9 Scores 11/21/2021 01/06/2021 11/03/2019 10/31/2018 07/10/2018 12/13/2017 11/23/2016  PHQ - 2 Score 0 0 0 0 0 0 0  Exception Documentation - - - - - - -    Fall Risk Fall Risk  11/21/2021 01/06/2021 11/03/2019 10/31/2018 07/10/2018  Falls in the past year? 0 0 0 0 No  Comment - - - - -  Number falls in past yr: 0 0 0 - -  Injury with Fall? 0 0 0 - -  Risk for fall due to : - - - - -  Follow up Falls prevention discussed - - - -    FALL RISK PREVENTION PERTAINING TO THE HOME:  Any stairs in or around the home? Yes  If so, are there any without handrails? No  Home free of loose throw rugs in walkways, pet beds, electrical cords, etc? Yes  Adequate lighting in your home to reduce risk of falls? Yes   ASSISTIVE DEVICES UTILIZED TO PREVENT FALLS:  Life alert? No  Use of a cane, walker or w/c? No  Grab bars in the bathroom? No  Shower chair or bench in shower? No  Elevated toilet seat or a handicapped toilet? No   TIMED UP AND GO:  Was the test performed? Yes .  Length of time to  ambulate 10 feet: 10 sec.   Gait steady and fast without use of assistive device  Cognitive Function:Normal cognitive status assessed by direct observation by this Nurse Health  Advisor. No abnormalities found.          Immunizations Immunization History  Administered Date(s) Administered   Influenza Split 11/04/2012   Influenza, High Dose Seasonal PF 08/24/2016, 12/13/2017, 09/24/2019   Influenza,inj,Quad PF,6+ Mos 09/21/2014, 09/16/2015, 10/02/2018   Influenza-Unspecified 10/10/2019, 10/18/2020, 09/30/2021   PFIZER Comirnaty(Gray Top)Covid-19 Tri-Sucrose Vaccine 01/19/2020, 02/02/2020, 10/18/2020, 04/06/2021   Pfizer Covid-19 Vaccine Bivalent Booster 9yr & up 10/17/2021   Pneumococcal Conjugate-13 06/01/2014, 12/29/2016   Pneumococcal Polysaccharide-23 05/06/2012   Td 10/17/2004, 07/06/2021   Tdap 11/21/2014    TDAP status: Up to date  Flu Vaccine status: Up to date  Pneumococcal vaccine status: Up to date  Covid-19 vaccine status: Completed vaccines  Qualifies for Shingles Vaccine? Yes   Zostavax completed No   Shingrix Completed?: No.    Education has been provided regarding the importance of this vaccine. Patient has been advised to call insurance company to determine out of pocket expense if they have not yet received this vaccine. Advised may also receive vaccine at local pharmacy or Health Dept. Verbalized acceptance and understanding.  Screening Tests Health Maintenance  Topic Date Due   Zoster Vaccines- Shingrix (1 of 2) Never done   OPHTHALMOLOGY EXAM  12/08/2021   FOOT EXAM  01/06/2022   HEMOGLOBIN A1C  01/06/2022   TETANUS/TDAP  07/07/2031   Pneumonia Vaccine 75 Years old  Completed   INFLUENZA VACCINE  Completed   COVID-19 Vaccine  Completed   Hepatitis C Screening  Completed   HPV VACCINES  Aged Out   COLONOSCOPY (Pts 45-459yrInsurance coverage will need to be confirmed)  Discontinued    Health Maintenance  Health Maintenance Due  Topic Date Due   Zoster Vaccines- Shingrix (1 of 2) Never done    Colorectal cancer screening: No longer required.   Lung Cancer Screening: (Low Dose CT Chest recommended if Age 76-80years, 30 pack-year currently smoking OR have quit w/in 15years.) does not qualify.     Additional Screening:  Hepatitis C Screening: Completed 12/08/2015  Vision Screening: Recommended annual ophthalmology exams for early detection of glaucoma and other disorders of the eye. Is the patient up to date with their annual eye exam?  Yes  Who is the provider or what is the name of the office in which the patient attends annual eye exams? DR. ScNicki Reaper Dental Screening: Recommended annual dental exams for proper oral hygiene  Community Resource Referral / Chronic Care Management: CRR required this visit?  No   CCM required this visit?  No      Plan:     I have personally reviewed and noted the following in the patient's chart:   Medical and social history Use of alcohol, tobacco or illicit drugs  Current medications and supplements including opioid prescriptions. Patient is not currently taking opioid prescriptions. Functional ability and status Nutritional status Physical activity Advanced directives List of other physicians Hospitalizations, surgeries, and ER visits in previous 12 months Vitals Screenings to include cognitive, depression, and falls Referrals and appointments  In addition, I have reviewed and discussed with patient certain preventive protocols, quality metrics, and best practice recommendations. A written personalized care plan for preventive services as well as general preventive health recommendations were provided to patient.   Patient would like to access avs on mychart.  MaJeanie Cooks  Dorothyann Peng, [place]   [id number]  Nurse Health Advisor  Nurse Notes: None

## 2021-11-21 ENCOUNTER — Ambulatory Visit (INDEPENDENT_AMBULATORY_CARE_PROVIDER_SITE_OTHER): Payer: Medicare Other

## 2021-11-21 VITALS — BP 160/91 | HR 60 | Temp 97.1°F | Resp 16 | Ht 75.0 in | Wt 210.6 lb

## 2021-11-21 DIAGNOSIS — Z0000 Encounter for general adult medical examination without abnormal findings: Secondary | ICD-10-CM

## 2021-11-21 NOTE — Patient Instructions (Signed)
Michael Wiggins , Thank you for taking time to come for your Medicare Wellness Visit. I appreciate your ongoing commitment to your health goals. Please review the following plan we discussed and let me know if I can assist you in the future.   Screening recommendations/referrals: Colonoscopy: Per our conversation, you will call GI to see if you need to schedule another colonoscopy. Recommended yearly ophthalmology/optometry visit for glaucoma screening and checkup Recommended yearly dental visit for hygiene and checkup  Vaccinations: Influenza vaccine: Up to date Pneumococcal vaccine: Up to date Tdap vaccine: Up to date Shingles vaccine: Discuss with pharmacy   Covid-19: Up  Advanced directives: Please bring a copy of Living Will and/or [place] for your chart once completed.   Conditions/risks identified: See problem list  Next appointment: Follow up in one year for your annual wellness visit.   Preventive Care 70 Years and Older, Male Preventive care refers to lifestyle choices and visits with your health care provider that can promote health and wellness. What does preventive care include? A yearly physical exam. This is also called an annual well check. Dental exams once or twice a year. Routine eye exams. Ask your health care provider how often you should have your eyes checked. Personal lifestyle choices, including: Daily care of your teeth and gums. Regular physical activity. Eating a healthy diet. Avoiding tobacco and drug use. Limiting alcohol use. Practicing safe sex. Taking low doses of aspirin every day. Taking vitamin and mineral supplements as recommended by your health care provider. What happens during an annual well check? The services and screenings done by your health care provider during your annual well check will depend on your age, overall health, lifestyle risk factors, and family history of disease. Counseling  Your health care provider  may ask you questions about your: Alcohol use. Tobacco use. Drug use. Emotional well-being. Home and relationship well-being. Sexual activity. Eating habits. History of falls. Memory and ability to understand (cognition). Work and work Statistician. Screening  You may have the following tests or measurements: Height, weight, and BMI. Blood pressure. Lipid and cholesterol levels. These may be checked every 5 years, or more frequently if you are over 46 years old. Skin check. Lung cancer screening. You may have this screening every year starting at age 76 if you have a 30-pack-year history of smoking and currently smoke or have quit within the past 15 years. Fecal occult blood test (FOBT) of the stool. You may have this test every year starting at age 76. Flexible sigmoidoscopy or colonoscopy. You may have a sigmoidoscopy every 5 years or a colonoscopy every 10 years starting at age 76. Prostate cancer screening. Recommendations will vary depending on your family history and other risks. Hepatitis C blood test. Hepatitis B blood test. Sexually transmitted disease (STD) testing. Diabetes screening. This is done by checking your blood sugar (glucose) after you have not eaten for a while (fasting). You may have this done every 1-3 years. Abdominal aortic aneurysm (AAA) screening. You may need this if you are a current or former smoker. Osteoporosis. You may be screened starting at age 76 if you are at high risk. Talk with your health care provider about your test results, treatment options, and if necessary, the need for more tests. Vaccines  Your health care provider may recommend certain vaccines, such as: Influenza vaccine. This is recommended every year. Tetanus, diphtheria, and acellular pertussis (Tdap, Td) vaccine. You may need a Td booster every 10 years. Zoster vaccine.  You may need this after age 76. Pneumococcal 13-valent conjugate (PCV13) vaccine. One dose is recommended after  age 76. Pneumococcal polysaccharide (PPSV23) vaccine. One dose is recommended after age 76. Talk to your health care provider about which screenings and vaccines you need and how often you need them. This information is not intended to replace advice given to you by your health care provider. Make sure you discuss any questions you have with your health care provider. Document Released: 12/31/2015 Document Revised: 08/23/2016 Document Reviewed: 10/05/2015 Elsevier Interactive Patient Education  2017 [place] Prevention in the Home Falls can cause injuries. They can happen to people of all ages. There are many things you can do to make your home safe and to help prevent falls. What can I do on the outside of my home? Regularly fix the edges of walkways and driveways and fix any cracks. Remove anything that might make you trip as you walk through a door, such as a raised step or threshold. Trim any bushes or trees on the path to your home. Use bright outdoor lighting. Clear any walking paths of anything that might make someone trip, such as rocks or tools. Regularly check to see if handrails are loose or broken. Make sure that both sides of any steps have handrails. Any raised decks and porches should have guardrails on the edges. Have any leaves, snow, or ice cleared regularly. Use sand or salt on walking paths during winter. Clean up any spills in your garage right away. This includes oil or grease spills. What can I do in the bathroom? Use night lights. Install grab bars by the toilet and in the tub and shower. Do not use towel bars as grab bars. Use non-skid mats or decals in the tub or shower. If you need to sit down in the shower, use a plastic, non-slip stool. Keep the floor dry. Clean up any water that spills on the floor as soon as it happens. Remove soap buildup in the tub or shower regularly. Attach bath mats securely with double-sided non-slip rug tape. Do not have  throw rugs and other things on the floor that can make you trip. What can I do in the bedroom? Use night lights. Make sure that you have a light by your bed that is easy to reach. Do not use any sheets or blankets that are too big for your bed. They should not hang down onto the floor. Have a firm chair that has side arms. You can use this for support while you get dressed. Do not have throw rugs and other things on the floor that can make you trip. What can I do in the kitchen? Clean up any spills right away. Avoid walking on wet floors. Keep items that you use a lot in easy-to-reach places. If you need to reach something above you, use a strong step stool that has a grab bar. Keep electrical cords out of the way. Do not use floor polish or wax that makes floors slippery. If you must use wax, use non-skid floor wax. Do not have throw rugs and other things on the floor that can make you trip. What can I do with my stairs? Do not leave any items on the stairs. Make sure that there are handrails on both sides of the stairs and use them. Fix handrails that are broken or loose. Make sure that handrails are as long as the stairways. Check any carpeting to make sure that it is  firmly attached to the stairs. Fix any carpet that is loose or worn. Avoid having throw rugs at the top or bottom of the stairs. If you do have throw rugs, attach them to the floor with carpet tape. Make sure that you have a light switch at the top of the stairs and the bottom of the stairs. If you do not have them, ask someone to add them for you. What else can I do to help prevent falls? Wear shoes that: Do not have high heels. Have rubber bottoms. Are comfortable and fit you well. Are closed at the toe. Do not wear sandals. If you use a stepladder: Make sure that it is fully opened. Do not climb a closed stepladder. Make sure that both sides of the stepladder are locked into place. Ask someone to hold it for you, if  possible. Clearly [place] and make sure that you can see: Any grab bars or handrails. First and last steps. Where the edge of each step is. Use tools that help you move around (mobility aids) if they are needed. These include: Canes. Walkers. Scooters. Crutches. Turn on the lights when you go into a dark area. Replace any light bulbs as soon as they burn out. Set up your furniture so you have a clear path. Avoid moving your furniture around. If any of your floors are uneven, fix them. If there are any pets around you, be aware of where they are. Review your medicines with your doctor. Some medicines can make you feel dizzy. This can increase your chance of falling. Ask your doctor what other things that you can do to help prevent falls. This information is not intended to replace advice given to you by your health care provider. Make sure you discuss any questions you have with your health care provider. Document Released: 09/30/2009 Document Revised: 05/11/2016 Document Reviewed: 01/08/2015 Elsevier Interactive Patient Education  2017 [employer].

## 2021-11-25 ENCOUNTER — Telehealth: Payer: Self-pay | Admitting: Family Medicine

## 2021-11-25 NOTE — Telephone Encounter (Signed)
Pt.called to inform provider that his colonoscopy was completed last year. He stated he doesn't remember the date but his GI informed him.

## 2021-11-28 NOTE — Telephone Encounter (Signed)
Chart updated

## 2021-12-06 DIAGNOSIS — E1151 Type 2 diabetes mellitus with diabetic peripheral angiopathy without gangrene: Secondary | ICD-10-CM | POA: Diagnosis not present

## 2021-12-06 DIAGNOSIS — B351 Tinea unguium: Secondary | ICD-10-CM | POA: Diagnosis not present

## 2021-12-06 DIAGNOSIS — M79672 Pain in left foot: Secondary | ICD-10-CM | POA: Diagnosis not present

## 2021-12-06 DIAGNOSIS — L84 Corns and callosities: Secondary | ICD-10-CM | POA: Diagnosis not present

## 2021-12-06 DIAGNOSIS — L602 Onychogryphosis: Secondary | ICD-10-CM | POA: Diagnosis not present

## 2021-12-06 DIAGNOSIS — M79671 Pain in right foot: Secondary | ICD-10-CM | POA: Diagnosis not present

## 2021-12-13 ENCOUNTER — Other Ambulatory Visit: Payer: Self-pay | Admitting: Internal Medicine

## 2021-12-20 DIAGNOSIS — D472 Monoclonal gammopathy: Secondary | ICD-10-CM | POA: Diagnosis not present

## 2021-12-20 DIAGNOSIS — R809 Proteinuria, unspecified: Secondary | ICD-10-CM | POA: Diagnosis not present

## 2021-12-20 DIAGNOSIS — N181 Chronic kidney disease, stage 1: Secondary | ICD-10-CM | POA: Diagnosis not present

## 2021-12-20 DIAGNOSIS — E1122 Type 2 diabetes mellitus with diabetic chronic kidney disease: Secondary | ICD-10-CM | POA: Diagnosis not present

## 2021-12-20 DIAGNOSIS — I129 Hypertensive chronic kidney disease with stage 1 through stage 4 chronic kidney disease, or unspecified chronic kidney disease: Secondary | ICD-10-CM | POA: Diagnosis not present

## 2021-12-20 DIAGNOSIS — E785 Hyperlipidemia, unspecified: Secondary | ICD-10-CM | POA: Diagnosis not present

## 2022-01-04 ENCOUNTER — Encounter: Payer: Self-pay | Admitting: Family Medicine

## 2022-01-05 ENCOUNTER — Ambulatory Visit (INDEPENDENT_AMBULATORY_CARE_PROVIDER_SITE_OTHER): Payer: Medicare Other

## 2022-01-05 DIAGNOSIS — I441 Atrioventricular block, second degree: Secondary | ICD-10-CM

## 2022-01-06 LAB — CUP PACEART REMOTE DEVICE CHECK
Battery Remaining Longevity: 48 mo
Battery Remaining Percentage: 52 %
Battery Voltage: 2.98 V
Brady Statistic AP VP Percent: 22 %
Brady Statistic AP VS Percent: 1 %
Brady Statistic AS VP Percent: 77 %
Brady Statistic AS VS Percent: 1 %
Brady Statistic RA Percent Paced: 20 %
Date Time Interrogation Session: 20230120010347
Implantable Lead Implant Date: 20160822
Implantable Lead Implant Date: 20160822
Implantable Lead Implant Date: 20190118
Implantable Lead Location: 753858
Implantable Lead Location: 753859
Implantable Lead Location: 753860
Implantable Pulse Generator Implant Date: 20190118
Lead Channel Impedance Value: 1050 Ohm
Lead Channel Impedance Value: 390 Ohm
Lead Channel Impedance Value: 530 Ohm
Lead Channel Pacing Threshold Amplitude: 1.125 V
Lead Channel Pacing Threshold Amplitude: 1.5 V
Lead Channel Pacing Threshold Amplitude: 1.75 V
Lead Channel Pacing Threshold Pulse Width: 0.5 ms
Lead Channel Pacing Threshold Pulse Width: 0.5 ms
Lead Channel Pacing Threshold Pulse Width: 0.5 ms
Lead Channel Sensing Intrinsic Amplitude: 12 mV
Lead Channel Sensing Intrinsic Amplitude: 2.4 mV
Lead Channel Setting Pacing Amplitude: 2.125
Lead Channel Setting Pacing Amplitude: 2.5 V
Lead Channel Setting Pacing Amplitude: 3.25 V
Lead Channel Setting Pacing Pulse Width: 0.5 ms
Lead Channel Setting Pacing Pulse Width: 0.5 ms
Lead Channel Setting Sensing Sensitivity: 4 mV
Pulse Gen Model: 3262
Pulse Gen Serial Number: 8983138

## 2022-01-08 NOTE — Progress Notes (Addendum)
[place] [facility] [address], [place], [place] [address] [phone] [id number]  Date:  01/11/2022   Name:  Michael Knoble Sr.   DOB:  [birth year]   MRN:  [id number]  PCP:  Michael Mclean, MD    Chief Complaint: 6 month follow up (Concerns/ questions: 1. Tinnitus bilateral. 2. No protein in urine per Nephrologists. )   History of Present Illness:  Michael Mcguire Sr. is a 76 y.o. 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 Marin Olp in October-continues to have IgG kappa smoldering myeloma Is also a patient [facility] Dr Michael Wiggins, most recent visit earlier this month- he was told no proteinuria  Seen by [facility] about 1 year ago-they continued surveillance of elevated PSA.  Appt is in March  He has noticed tinnitus in both ears for 3 months or so His ears itch but do not hurt Hearing seems to be ok Constant "tone" in his ears, high pitched-nonpulsatile This sounds generally quite faint  Otherwise no neurologic changes such as worsening headaches  He notes his am BP is running higher- perhaps 147/90s He then will do his work- out and his blood pressure seems to come down He increased his entresto to BID just about 2 weeks ago to help with his BP -however, he is concerned that his a.m. numbers are still high  Eye exam;coming up on annual exam, patient will schedule Foot exam is due; update today A1c needed Shingrix Lab Results  Component Value Date   HGBA1C 7.5 (H) 07/06/2021   Lipitor  20 Carvedilol Glipizide 5 Metformin 1000 twice daily Entresto  Labs from October: CMP, CBC  BP Readings from Last 3 Encounters:  01/11/22 (!) 142/80  11/21/21 (!) 160/91  10/04/21 (!) 156/89     Patient Active Problem List   Diagnosis Date Noted   Chronic kidney disease (CKD), active medical management without dialysis 10/09/2019   Iron deficiency anemia due to chronic blood loss 90/93/1121   Chronic systolic (congestive) heart failure ([place]) 01/04/2018   Pacemaker 12/08/2015   Mobitz type 2 second degree heart block 08/08/2015   Right bundle branch block 12/16/2014   Syncope 12/16/2014   Proteinuria due to type 2 diabetes mellitus ([place]) 11/12/2014   Encounter for therapeutic drug monitoring 03/13/2013   Multiple myeloma ([place]) 02/15/2013   Edema 01/07/2013   Diabetes mellitus due to underlying condition with other diabetic kidney complication ([place])    Hyperlipidemia    Hypertension    MGUS (monoclonal gammopathy of unknown significance)    GERD (gastroesophageal reflux disease)     Past Medical History:  Diagnosis Date   Anemia    Cataract    Chronic systolic (congestive) heart failure ([place])    Diabetes mellitus    GERD (gastroesophageal reflux disease)    Hyperlipidemia    Hypertension    Iron deficiency anemia due to chronic blood loss 10/02/2019   MGUS (monoclonal gammopathy of unknown significance)    Multiple myeloma ([place]) 02/2013   normal cytogenetics and FISH panel on 03/11/2013.  Osteoarthritis    Presence of permanent cardiac pacemaker    Right bundle branch block    Syncope     Past Surgical History:  Procedure Laterality Date   BIV UPGRADE  01/04/2018   BIV UPGRADE N/A 01/04/2018   Procedure: BIVP UPGRADE;  Surgeon: [name], MD;  Location: [place] [facility];  Service: Cardiovascular;  Laterality: N/A;   EP IMPLANTABLE DEVICE N/A 08/09/2015   Procedure: Pacemaker Implant;  Surgeon: [name], MD;  Location: [place] [facility];  Service:  Cardiovascular;  Laterality: N/A;   EYE SURGERY      Social History   Tobacco Use   Smoking status: Former    Packs/day: 2.00    Years: 35.00    Pack years: 70.00    Types: Cigarettes    Start date: 03/26/1963    Quit date: 12/18/1997    Years since quitting: 24.0   Smokeless tobacco: Never   Tobacco comments:    quit 15 years  Vaping Use   Vaping Use: Never used  Substance Use Topics   Alcohol use: No    Alcohol/week: 0.0 standard drinks   Drug use: No    Family History  Problem Relation Age of Onset   Hypertension Mother    Cancer Mother    Stroke Mother    Hypertension Sister    Hypertension Brother    Hypertension Maternal Grandmother     Allergies  Allergen Reactions   Amlodipine Swelling   Crestor [Rosuvastatin] Other (See Comments)    Per pt unable to focus    Medication list has been reviewed and updated.  Current Outpatient Medications on File Prior to Visit  Medication Sig Dispense Refill   albuterol (PROAIR HFA) 108 (90 Base) MCG/ACT inhaler Inhale 2 puffs into the lungs every 6 (six) hours as needed for wheezing or shortness of breath. 8.5 g 1   atorvastatin (LIPITOR) 20 MG tablet TAKE 1 TABLET BY MOUTH EVERY DAY 90 tablet 3   carvedilol (COREG) 25 MG tablet TAKE 1.5 TABLETS (37.5 MG TOTAL) BY MOUTH 2 (TWO) TIMES DAILY WITH A MEAL. 270 tablet 3   ferrous sulfate 325 (65 FE) MG tablet Take 325 mg by mouth every other day.     Ginkgo Biloba 40 MG TABS Take 1 tablet by mouth daily.      glipiZIDE (GLUCOTROL XL) 5 MG 24 hr tablet TAKE 1 TABLET BY MOUTH EVERY DAY IN THE MORNING 90 tablet 1   glucose blood test strip Use as instructed 100 each 12   latanoprost (XALATAN) 0.005 % ophthalmic solution Place 1 drop into both eyes at bedtime.      metFORMIN (GLUCOPHAGE) 1000 MG tablet TAKE 1 TABLET (1,000 MG TOTAL) BY MOUTH 2 (TWO) TIMES DAILY WITH A MEAL. 180 tablet 1   Multiple Vitamin (MULTIVITAMIN WITH MINERALS) TABS Take 1 tablet by mouth daily.      Omega-3  Fatty Acids (FISH OIL BURP-LESS PO) Take 1 capsule by mouth every other day.      sacubitril-valsartan (ENTRESTO) 49-51 MG Take 1 tablet by mouth 2 (two) times daily. 180 tablet 3   No current facility-administered medications on file prior to visit.    Review of Systems:  As per HPI- otherwise negative.   Physical Examination: Vitals:   01/11/22 1402  BP: (!) 142/80  Pulse: 60  Resp: 18  Temp: 97.8 F (36.6 C)  SpO2: 100%   Vitals:   01/11/22 1402  Weight: 212 lb 3.2  oz (96.3 kg)  Height: _0  (1.905 m)   Body mass index is 26.52 kg/m. Ideal Body Weight: Weight in (lb) to have BMI = 25: 199.6  GEN: no acute distress.  Tall build, mild overweight.  Looks well HEENT: Atraumatic, Normocephalic.  Ears and Nose: No external deformity. CV: RRR, No M/G/R. No JVD. No thrill. No extra heart sounds. PULM: CTA B, no wheezes, crackles, rhonchi. No retractions. No resp. distress. No accessory muscle use. ABD: S, NT, ND No rebound. No HSM. EXTR: No c/c/e PSYCH: Normally interactive. Conversant.  Foot exam- normal, flat foot on the left-patient has bilateral bunion and arthritis at the first MTP  Assessment and Plan: Primary hypertension - Plan: amLODipine (NORVASC) 2.5 MG tablet  Type 2 diabetes mellitus with chronic kidney disease, without long-term current use of insulin, unspecified CKD stage ([place]) - Plan: Hemoglobin A1c  Hyperlipidemia, unspecified hyperlipidemia type - Plan: Lipid panel  Tinnitus of both ears  Patient seen today for follow-up.  He notes his first morning blood pressures are elevated to about 150 over 90s.  This is concerning to him.  We will add 2.5 mg amlodipine to evening medications.  He has experienced leg swelling on higher doses of amlodipine in the past, he is willing to try 2.5 and see how he does No other symptoms of allergic reaction previously  Follow-up on diabetes control and lipids today  Discussed his tinnitus.  Ear exam is normal.   Explained that most frequently tinnitus is a benign finding, occasionally you can indicate something more serious such as an auditory nerve neoplasm.  Offered to have him seen by ENT.  For the time being he declines, will let me know if getting worse Signed Lamar Blinks, MD  Received labs as below, message to patient  Results for orders placed or performed in visit on 01/11/22  Hemoglobin A1c  Result Value Ref Range   Hgb A1c MFr Bld 8.1 (H) 4.6 - 6.5 %  Lipid panel  Result Value Ref Range   Cholesterol 119 0 - 200 mg/dL   Triglycerides 79.0 0.0 - 149.0 mg/dL   HDL 32.90 (L) >39.00 mg/dL   VLDL 15.8 0.0 - 40.0 mg/dL   LDL Cholesterol 70 0 - 99 mg/dL   Total CHOL/HDL Ratio 4    NonHDL 85.71

## 2022-01-08 NOTE — Patient Instructions (Addendum)
It was great to see you again today, as always I will be in touch with your lab work Assuming all is well, please see me in about 6 months Please consider getting the shingles vaccine-Shingrix-at your pharmacy  We will try adding amlodipine 2.5 to your BP regimen- take in the evening  I do think it would be ok for you to try voltaren gel on your toe

## 2022-01-11 ENCOUNTER — Ambulatory Visit (INDEPENDENT_AMBULATORY_CARE_PROVIDER_SITE_OTHER): Payer: Medicare Other | Admitting: Family Medicine

## 2022-01-11 VITALS — BP 142/80 | HR 60 | Temp 97.8°F | Resp 18 | Ht 75.0 in | Wt 212.2 lb

## 2022-01-11 DIAGNOSIS — I10 Essential (primary) hypertension: Secondary | ICD-10-CM

## 2022-01-11 DIAGNOSIS — E785 Hyperlipidemia, unspecified: Secondary | ICD-10-CM | POA: Diagnosis not present

## 2022-01-11 DIAGNOSIS — H9313 Tinnitus, bilateral: Secondary | ICD-10-CM | POA: Diagnosis not present

## 2022-01-11 DIAGNOSIS — E1122 Type 2 diabetes mellitus with diabetic chronic kidney disease: Secondary | ICD-10-CM

## 2022-01-11 MED ORDER — AMLODIPINE BESYLATE 2.5 MG PO TABS: 2.5000 mg | ORAL_TABLET | Freq: Every day | ORAL | 1 refills | Status: DC

## 2022-01-12 ENCOUNTER — Encounter: Payer: Self-pay | Admitting: Family Medicine

## 2022-01-12 LAB — LIPID PANEL
Cholesterol: 119 mg/dL (ref 0–200)
HDL: 32.9 mg/dL — ABNORMAL LOW (ref >39.00)
LDL Cholesterol: 70 mg/dL (ref 0–99)
NonHDL: 85.71
Total CHOL/HDL Ratio: 4
Triglycerides: 79 mg/dL (ref 0.0–149.0)
VLDL: 15.8 mg/dL (ref 0.0–40.0)

## 2022-01-12 LAB — HEMOGLOBIN A1C: Hgb A1c MFr Bld: 8.1 % — ABNORMAL HIGH (ref 4.6–6.5)

## 2022-01-18 NOTE — Progress Notes (Signed)
Remote pacemaker transmission.   

## 2022-01-20 DIAGNOSIS — H34811 Central retinal vein occlusion, right eye, with macular edema: Secondary | ICD-10-CM | POA: Diagnosis not present

## 2022-01-26 ENCOUNTER — Encounter: Payer: Self-pay | Admitting: Family Medicine

## 2022-01-28 ENCOUNTER — Other Ambulatory Visit: Payer: Self-pay | Admitting: Family Medicine

## 2022-02-02 ENCOUNTER — Other Ambulatory Visit: Payer: Self-pay | Admitting: Family Medicine

## 2022-02-02 DIAGNOSIS — I10 Essential (primary) hypertension: Secondary | ICD-10-CM

## 2022-02-06 DIAGNOSIS — M2021 Hallux rigidus, right foot: Secondary | ICD-10-CM | POA: Diagnosis not present

## 2022-02-06 DIAGNOSIS — M792 Neuralgia and neuritis, unspecified: Secondary | ICD-10-CM | POA: Diagnosis not present

## 2022-02-06 DIAGNOSIS — M2022 Hallux rigidus, left foot: Secondary | ICD-10-CM | POA: Diagnosis not present

## 2022-02-06 DIAGNOSIS — E1151 Type 2 diabetes mellitus with diabetic peripheral angiopathy without gangrene: Secondary | ICD-10-CM | POA: Diagnosis not present

## 2022-02-08 DIAGNOSIS — H40013 Open angle with borderline findings, low risk, bilateral: Secondary | ICD-10-CM | POA: Diagnosis not present

## 2022-02-15 ENCOUNTER — Encounter: Payer: Self-pay | Admitting: Family Medicine

## 2022-02-15 DIAGNOSIS — D472 Monoclonal gammopathy: Secondary | ICD-10-CM | POA: Diagnosis not present

## 2022-02-15 DIAGNOSIS — I5022 Chronic systolic (congestive) heart failure: Secondary | ICD-10-CM | POA: Diagnosis not present

## 2022-02-15 DIAGNOSIS — E785 Hyperlipidemia, unspecified: Secondary | ICD-10-CM | POA: Diagnosis not present

## 2022-02-15 DIAGNOSIS — N1831 Chronic kidney disease, stage 3a: Secondary | ICD-10-CM | POA: Diagnosis not present

## 2022-02-15 DIAGNOSIS — R809 Proteinuria, unspecified: Secondary | ICD-10-CM | POA: Diagnosis not present

## 2022-02-15 DIAGNOSIS — I129 Hypertensive chronic kidney disease with stage 1 through stage 4 chronic kidney disease, or unspecified chronic kidney disease: Secondary | ICD-10-CM | POA: Diagnosis not present

## 2022-02-15 DIAGNOSIS — C9000 Multiple myeloma not having achieved remission: Secondary | ICD-10-CM

## 2022-02-15 DIAGNOSIS — E1122 Type 2 diabetes mellitus with diabetic chronic kidney disease: Secondary | ICD-10-CM | POA: Diagnosis not present

## 2022-02-24 ENCOUNTER — Other Ambulatory Visit: Payer: Self-pay | Admitting: Family Medicine

## 2022-02-24 ENCOUNTER — Telehealth: Payer: Self-pay | Admitting: Family Medicine

## 2022-02-24 DIAGNOSIS — C9000 Multiple myeloma not having achieved remission: Secondary | ICD-10-CM

## 2022-02-24 MED ORDER — GLIPIZIDE ER 10 MG PO TB24: ORAL_TABLET | ORAL | 0 refills | Status: DC

## 2022-02-24 MED ORDER — GLIPIZIDE ER 5 MG PO TB24: 10.0000 mg | ORAL_TABLET | Freq: Two times a day (BID) | ORAL | 0 refills | Status: DC

## 2022-02-24 NOTE — Telephone Encounter (Signed)
Per MyChart messages. Pt would like to take 5 mg BID, not 10 mg once  daily. Pharmacy is aware.  ?

## 2022-02-24 NOTE — Telephone Encounter (Signed)
Pharmacy is having trouble giving rx to pt because they received two orders. They just need a nurse to confirm pt is supposed to take '5mg'$  twice a day. Please advise.  ? ?glipiZIDE (GLUCOTROL XL) 5 MG 24 hr tablet  ? ? ?[id number] [place] [facility] [place] ?Phone:  [phone] Fax:  [phone] ?

## 2022-02-24 NOTE — Telephone Encounter (Signed)
View mychart messages dated 01/12/22- correct dose is glipizide '10mg'$  daily.  ?

## 2022-02-24 NOTE — Addendum Note (Signed)
Addended by: CREFT, Kristine Garbe L on: 02/24/2022 10:50 AM ? ? Modules accepted: Orders ? ?

## 2022-02-27 DIAGNOSIS — R3914 Feeling of incomplete bladder emptying: Secondary | ICD-10-CM | POA: Diagnosis not present

## 2022-02-27 DIAGNOSIS — R3912 Poor urinary stream: Secondary | ICD-10-CM | POA: Diagnosis not present

## 2022-03-12 ENCOUNTER — Other Ambulatory Visit: Payer: Self-pay | Admitting: Family Medicine

## 2022-03-12 DIAGNOSIS — I10 Essential (primary) hypertension: Secondary | ICD-10-CM

## 2022-03-21 DIAGNOSIS — B351 Tinea unguium: Secondary | ICD-10-CM | POA: Diagnosis not present

## 2022-03-21 DIAGNOSIS — E1151 Type 2 diabetes mellitus with diabetic peripheral angiopathy without gangrene: Secondary | ICD-10-CM | POA: Diagnosis not present

## 2022-03-21 DIAGNOSIS — L602 Onychogryphosis: Secondary | ICD-10-CM | POA: Diagnosis not present

## 2022-04-06 ENCOUNTER — Ambulatory Visit (INDEPENDENT_AMBULATORY_CARE_PROVIDER_SITE_OTHER): Payer: Medicare Other

## 2022-04-06 DIAGNOSIS — I441 Atrioventricular block, second degree: Secondary | ICD-10-CM

## 2022-04-06 LAB — CUP PACEART REMOTE DEVICE CHECK
Battery Remaining Longevity: 40 mo
Battery Remaining Percentage: 49 %
Battery Voltage: 2.98 V
Brady Statistic AP VP Percent: 22 %
Brady Statistic AP VS Percent: 1 %
Brady Statistic AS VP Percent: 76 %
Brady Statistic AS VS Percent: 1 %
Brady Statistic RA Percent Paced: 21 %
Date Time Interrogation Session: 20230420040015
Implantable Lead Implant Date: 20160822
Implantable Lead Implant Date: 20160822
Implantable Lead Implant Date: 20190118
Implantable Lead Location: 753858
Implantable Lead Location: 753859
Implantable Lead Location: 753860
Implantable Pulse Generator Implant Date: 20190118
Lead Channel Impedance Value: 1100 Ohm
Lead Channel Impedance Value: 390 Ohm
Lead Channel Impedance Value: 560 Ohm
Lead Channel Pacing Threshold Amplitude: 1.125 V
Lead Channel Pacing Threshold Amplitude: 1.875 V
Lead Channel Pacing Threshold Amplitude: 2 V
Lead Channel Pacing Threshold Pulse Width: 0.5 ms
Lead Channel Pacing Threshold Pulse Width: 0.5 ms
Lead Channel Pacing Threshold Pulse Width: 0.5 ms
Lead Channel Sensing Intrinsic Amplitude: 1.4 mV
Lead Channel Sensing Intrinsic Amplitude: 12 mV
Lead Channel Setting Pacing Amplitude: 2.125
Lead Channel Setting Pacing Amplitude: 2.875
Lead Channel Setting Pacing Amplitude: 3.5 V
Lead Channel Setting Pacing Pulse Width: 0.5 ms
Lead Channel Setting Pacing Pulse Width: 0.5 ms
Lead Channel Setting Sensing Sensitivity: 4 mV
Pulse Gen Model: 3262
Pulse Gen Serial Number: 8983138

## 2022-04-20 ENCOUNTER — Other Ambulatory Visit: Payer: Self-pay | Admitting: Family Medicine

## 2022-04-24 NOTE — Progress Notes (Signed)
Remote pacemaker transmission.   

## 2022-04-26 ENCOUNTER — Other Ambulatory Visit: Payer: Self-pay | Admitting: Family Medicine

## 2022-05-10 ENCOUNTER — Encounter: Payer: Self-pay | Admitting: Family Medicine

## 2022-05-22 ENCOUNTER — Other Ambulatory Visit: Payer: Self-pay | Admitting: Family Medicine

## 2022-05-22 DIAGNOSIS — C9000 Multiple myeloma not having achieved remission: Secondary | ICD-10-CM

## 2022-06-05 ENCOUNTER — Inpatient Hospital Stay: Payer: Medicare Other | Attending: Hematology & Oncology | Admitting: Hematology & Oncology

## 2022-06-05 ENCOUNTER — Other Ambulatory Visit: Payer: Self-pay | Admitting: Oncology

## 2022-06-05 ENCOUNTER — Inpatient Hospital Stay: Payer: Medicare Other

## 2022-06-05 ENCOUNTER — Encounter: Payer: Self-pay | Admitting: Hematology & Oncology

## 2022-06-05 VITALS — BP 161/93 | HR 68 | Temp 97.6°F | Resp 17 | Wt 209.8 lb

## 2022-06-05 DIAGNOSIS — Z79899 Other long term (current) drug therapy: Secondary | ICD-10-CM | POA: Insufficient documentation

## 2022-06-05 DIAGNOSIS — D509 Iron deficiency anemia, unspecified: Secondary | ICD-10-CM | POA: Diagnosis not present

## 2022-06-05 DIAGNOSIS — D500 Iron deficiency anemia secondary to blood loss (chronic): Secondary | ICD-10-CM

## 2022-06-05 DIAGNOSIS — D472 Monoclonal gammopathy: Secondary | ICD-10-CM | POA: Diagnosis not present

## 2022-06-05 DIAGNOSIS — E119 Type 2 diabetes mellitus without complications: Secondary | ICD-10-CM | POA: Diagnosis not present

## 2022-06-05 DIAGNOSIS — Z7984 Long term (current) use of oral hypoglycemic drugs: Secondary | ICD-10-CM | POA: Insufficient documentation

## 2022-06-05 DIAGNOSIS — C9000 Multiple myeloma not having achieved remission: Secondary | ICD-10-CM

## 2022-06-05 LAB — CBC WITH DIFFERENTIAL (CANCER CENTER ONLY)
Abs Immature Granulocytes: 0.03 10*3/uL (ref 0.00–0.07)
Basophils Absolute: 0.1 10*3/uL (ref 0.0–0.1)
Basophils Relative: 1 %
Eosinophils Absolute: 0.5 10*3/uL (ref 0.0–0.5)
Eosinophils Relative: 6 %
HCT: 37 % — ABNORMAL LOW (ref 39.0–52.0)
Hemoglobin: 12.1 g/dL — ABNORMAL LOW (ref 13.0–17.0)
Immature Granulocytes: 0 %
Lymphocytes Relative: 29 %
Lymphs Abs: 2.5 10*3/uL (ref 0.7–4.0)
MCH: 29.8 pg (ref 26.0–34.0)
MCHC: 32.7 g/dL (ref 30.0–36.0)
MCV: 91.1 fL (ref 80.0–100.0)
Monocytes Absolute: 0.7 10*3/uL (ref 0.1–1.0)
Monocytes Relative: 9 %
Neutro Abs: 4.6 10*3/uL (ref 1.7–7.7)
Neutrophils Relative %: 55 %
Platelet Count: 221 10*3/uL (ref 150–400)
RBC: 4.06 MIL/uL — ABNORMAL LOW (ref 4.22–5.81)
RDW: 14 % (ref 11.5–15.5)
Smear Review: NORMAL
WBC Count: 8.4 10*3/uL (ref 4.0–10.5)
nRBC: 0 % (ref 0.0–0.2)

## 2022-06-05 LAB — CMP (CANCER CENTER ONLY)
ALT: 17 U/L (ref 0–44)
AST: 16 U/L (ref 15–41)
Albumin: 4.1 g/dL (ref 3.5–5.0)
Alkaline Phosphatase: 46 U/L (ref 38–126)
Anion gap: 6 (ref 5–15)
BUN: 30 mg/dL — ABNORMAL HIGH (ref 8–23)
CO2: 25 mmol/L (ref 22–32)
Calcium: 9.7 mg/dL (ref 8.9–10.3)
Chloride: 102 mmol/L (ref 98–111)
Creatinine: 1.38 mg/dL — ABNORMAL HIGH (ref 0.61–1.24)
GFR, Estimated: 53 mL/min — ABNORMAL LOW (ref >60)
Glucose, Bld: 65 mg/dL — ABNORMAL LOW (ref 70–99)
Potassium: 4.3 mmol/L (ref 3.5–5.1)
Sodium: 133 mmol/L — ABNORMAL LOW (ref 135–145)
Total Bilirubin: 0.3 mg/dL (ref 0.3–1.2)
Total Protein: 8.4 g/dL — ABNORMAL HIGH (ref 6.5–8.1)

## 2022-06-05 LAB — FERRITIN: Ferritin: 42 ng/mL (ref 24–336)

## 2022-06-05 LAB — LACTATE DEHYDROGENASE: LDH: 126 U/L (ref 98–192)

## 2022-06-05 NOTE — Progress Notes (Signed)
Hematology and Oncology Follow Up Visit  Michael Carranza Sr. [id number] 09-Dec-1946 76 y.o. 06/05/2022   Principle Diagnosis:  IgG kappa smoldering myeloma Iron deficiency anemia -- Venofer given on 03/2020  Current Therapy:   Observation     Interim History:  [name] is back for followup.  We last saw him back in October.  So far, has been doing pretty well.  He and his wife are busy moving a daughter up in [place] to a new apartment.  This was a little bit tiresome for them.  However, they got the task done.  He is still doing some work.  I got to give him credit for being as active as he is.  He definitely does not look like he is 76 years old.  When we last saw him in October, his monoclonal spike was 2 g/dL.  The IgG level was 2300 mg/dL.  The Kappa light chain was 1.3 mg/dL.  He has had no problems with fever.  He has had no problems with nausea or vomiting.  There is been no change in bowel or bladder habits.  He has had no leg swelling.  He has had no cough or shortness of breath.  He has had no headache.  When we last saw him in October, his ferritin was 73 with an iron saturation of 26%.  Overall, I would say his performance status is probably ECOG 0.   Medications:  Current Outpatient Medications:    albuterol (VENTOLIN HFA) 108 (90 Base) MCG/ACT inhaler, TAKE 2 PUFFS BY MOUTH EVERY 6 HOURS AS NEEDED FOR WHEEZE OR SHORTNESS OF BREATH, Disp: 8.5 each, Rfl: 1   amLODipine (NORVASC) 2.5 MG tablet, TAKE 1 TABLET BY MOUTH EVERY DAY, Disp: 30 tablet, Rfl: 3   atorvastatin (LIPITOR) 20 MG tablet, TAKE 1 TABLET BY MOUTH EVERY DAY, Disp: 90 tablet, Rfl: 3   carvedilol (COREG) 25 MG tablet, TAKE 1.5 TABLETS (37.5 MG TOTAL) BY MOUTH 2 (TWO) TIMES DAILY WITH A MEAL., Disp: 270 tablet, Rfl: 3   ferrous sulfate 325 (65 FE) MG tablet, Take 325 mg by mouth every other day., Disp: , Rfl:    Ginkgo Biloba 40 MG TABS, Take 1 tablet by mouth daily. , Disp: , Rfl:    glipiZIDE  (GLUCOTROL XL) 5 MG 24 hr tablet, TAKE 2 TABLETS BY MOUTH DAILY WITH BREAKFAST OR AS DIRECTED, Disp: 180 tablet, Rfl: 0   latanoprost (XALATAN) 0.005 % ophthalmic solution, Place 1 drop into both eyes at bedtime. , Disp: , Rfl:    metFORMIN (GLUCOPHAGE) 1000 MG tablet, TAKE 1 TABLET (1,000 MG TOTAL) BY MOUTH 2 (TWO) TIMES DAILY WITH A MEAL., Disp: 180 tablet, Rfl: 1   Multiple Vitamin (MULTIVITAMIN WITH MINERALS) TABS, Take 1 tablet by mouth daily. , Disp: , Rfl:    Omega-3 Fatty Acids (FISH OIL BURP-LESS PO), Take 1 capsule by mouth every other day. , Disp: , Rfl:    ONETOUCH ULTRA test strip, USE AS INSTRUCTED, Disp: 100 strip, Rfl: 12   sacubitril-valsartan (ENTRESTO) 49-51 MG, Take 1 tablet by mouth 2 (two) times daily., Disp: 180 tablet, Rfl: 3   tamsulosin (FLOMAX) 0.4 MG CAPS capsule, Take 0.4 mg by mouth at bedtime., Disp: , Rfl:   Allergies:  Allergies  Allergen Reactions   Amlodipine Swelling   Crestor [Rosuvastatin] Other (See Comments)    Per pt unable to focus    Past Medical History, Surgical history, Social history, and Family History were reviewed and  updated.  Review of Systems: Review of Systems  Constitutional: Negative.   HENT: Negative.    Eyes: Negative.   Respiratory: Negative.    Cardiovascular: Negative.   Gastrointestinal: Negative.   Genitourinary: Negative.   Musculoskeletal: Negative.   Skin: Negative.   Neurological: Negative.   Endo/Heme/Allergies: Negative.   Psychiatric/Behavioral: Negative.       Physical Exam:  weight is 209 lb 12.8 oz (95.2 kg). His oral temperature is 97.6 F (36.4 C). His blood pressure is 161/93 (abnormal) and his pulse is 68. His respiration is 17 and oxygen saturation is 98%.   Physical Exam Vitals reviewed.  HENT:     Head: Normocephalic and atraumatic.  Eyes:     Pupils: Pupils are equal, round, and reactive to light.  Cardiovascular:     Rate and Rhythm: Normal rate and regular rhythm.     Heart sounds:  Normal heart sounds.  Pulmonary:     Effort: Pulmonary effort is normal.     Breath sounds: Normal breath sounds.  Abdominal:     General: Bowel sounds are normal.     Palpations: Abdomen is soft.  Musculoskeletal:        General: No tenderness or deformity. Normal range of motion.     Cervical back: Normal range of motion.  Lymphadenopathy:     Cervical: No cervical adenopathy.  Skin:    General: Skin is warm and dry.     Findings: No erythema or rash.  Neurological:     Mental Status: He is alert and oriented to person, place, and time.  Psychiatric:        Behavior: Behavior normal.        Thought Content: Thought content normal.        Judgment: Judgment normal.      Lab Results  Component Value Date   WBC PENDING 06/05/2022   HGB 12.1 (L) 06/05/2022   HCT 37.0 (L) 06/05/2022   MCV 91.1 06/05/2022   PLT 221 06/05/2022     Chemistry      Component Value Date/Time   NA 136 10/04/2021 1009   NA 133 (L) 01/12/2020 1015   NA 142 10/03/2017 0910   NA 137 04/03/2017 0852   K 4.4 10/04/2021 1009   K 3.9 10/03/2017 0910   K 4.2 04/03/2017 0852   CL 101 10/04/2021 1009   CL 103 10/03/2017 0910   CL 108 (H) 05/15/2013 0912   CO2 29 10/04/2021 1009   CO2 28 10/03/2017 0910   CO2 26 04/03/2017 0852   BUN 20 10/04/2021 1009   BUN 25 01/12/2020 1015   BUN 13 10/03/2017 0910   BUN 16.3 04/03/2017 0852   CREATININE 1.21 10/04/2021 1009   CREATININE 1.07 11/16/2017 1437   CREATININE 1.0 04/03/2017 0852      Component Value Date/Time   CALCIUM 10.4 (H) 10/04/2021 1009   CALCIUM 9.3 10/03/2017 0910   CALCIUM 9.0 04/03/2017 0852   ALKPHOS 50 10/04/2021 1009   ALKPHOS 51 10/03/2017 0910   ALKPHOS 46 04/03/2017 0852   AST 17 10/04/2021 1009   AST 18 04/03/2017 0852   ALT 18 10/04/2021 1009   ALT 34 10/03/2017 0910   ALT 19 04/03/2017 0852   BILITOT 0.5 10/04/2021 1009   BILITOT 0.38 04/03/2017 0852     Impression and Plan: Michael Wiggins is 76 year old gentleman  with IgG kappa smoldering myeloma.  We have been following this for quite a while.  We will have to see  what his levels are right now.  As long as he is holding steady with his levels and that he is not symptomatic, I really think that we can hold on treatment.  I think he would be in favor of holding on treatment.  We will see what his iron levels are.  I would have to think that they should be okay.  We will now plan to try to get him back in 1 year.  We will get him back sooner depending on what his myeloma studies look like.   Volanda Napoleon, MD 6/19/20233:10 PM

## 2022-06-06 LAB — KAPPA/LAMBDA LIGHT CHAINS
Kappa free light chain: 124.9 mg/L — ABNORMAL HIGH (ref 3.3–19.4)
Kappa, lambda light chain ratio: 6.44 — ABNORMAL HIGH (ref 0.26–1.65)
Lambda free light chains: 19.4 mg/L (ref 5.7–26.3)

## 2022-06-06 LAB — IGG, IGA, IGM
IgA: 273 mg/dL (ref 61–437)
IgG (Immunoglobin G), Serum: 2627 mg/dL — ABNORMAL HIGH (ref 603–1613)
IgM (Immunoglobulin M), Srm: 22 mg/dL (ref 15–143)

## 2022-06-06 LAB — IRON AND IRON BINDING CAPACITY (CC-WL,HP ONLY)
Iron: 72 ug/dL (ref 45–182)
Saturation Ratios: 20 % (ref 17.9–39.5)
TIBC: 365 ug/dL (ref 250–450)
UIBC: 293 ug/dL (ref 117–376)

## 2022-06-09 LAB — PROTEIN ELECTROPHORESIS, SERUM, WITH REFLEX
A/G Ratio: 0.9 (ref 0.7–1.7)
Albumin ELP: 3.8 g/dL (ref 2.9–4.4)
Alpha-1-Globulin: 0.2 g/dL (ref 0.0–0.4)
Alpha-2-Globulin: 0.8 g/dL (ref 0.4–1.0)
Beta Globulin: 1 g/dL (ref 0.7–1.3)
Gamma Globulin: 2.2 g/dL — ABNORMAL HIGH (ref 0.4–1.8)
Globulin, Total: 4.1 g/dL — ABNORMAL HIGH (ref 2.2–3.9)
M-Spike, %: 1.7 g/dL — ABNORMAL HIGH
SPEP Interpretation: 0
Total Protein ELP: 7.9 g/dL (ref 6.0–8.5)

## 2022-06-09 LAB — IMMUNOFIXATION REFLEX, SERUM
IgA: 268 mg/dL (ref 61–437)
IgG (Immunoglobin G), Serum: 2656 mg/dL — ABNORMAL HIGH (ref 603–1613)
IgM (Immunoglobulin M), Srm: 22 mg/dL (ref 15–143)

## 2022-06-15 DIAGNOSIS — H2513 Age-related nuclear cataract, bilateral: Secondary | ICD-10-CM | POA: Diagnosis not present

## 2022-06-19 ENCOUNTER — Other Ambulatory Visit: Payer: Self-pay | Admitting: Family Medicine

## 2022-06-19 DIAGNOSIS — I10 Essential (primary) hypertension: Secondary | ICD-10-CM

## 2022-06-26 ENCOUNTER — Encounter: Payer: Self-pay | Admitting: *Deleted

## 2022-06-28 DIAGNOSIS — H34811 Central retinal vein occlusion, right eye, with macular edema: Secondary | ICD-10-CM | POA: Diagnosis not present

## 2022-06-29 DIAGNOSIS — E1122 Type 2 diabetes mellitus with diabetic chronic kidney disease: Secondary | ICD-10-CM | POA: Diagnosis not present

## 2022-06-29 DIAGNOSIS — I5022 Chronic systolic (congestive) heart failure: Secondary | ICD-10-CM | POA: Diagnosis not present

## 2022-06-29 DIAGNOSIS — C9000 Multiple myeloma not having achieved remission: Secondary | ICD-10-CM | POA: Diagnosis not present

## 2022-06-29 DIAGNOSIS — N1831 Chronic kidney disease, stage 3a: Secondary | ICD-10-CM | POA: Diagnosis not present

## 2022-06-29 DIAGNOSIS — I129 Hypertensive chronic kidney disease with stage 1 through stage 4 chronic kidney disease, or unspecified chronic kidney disease: Secondary | ICD-10-CM | POA: Diagnosis not present

## 2022-07-06 ENCOUNTER — Ambulatory Visit (INDEPENDENT_AMBULATORY_CARE_PROVIDER_SITE_OTHER): Payer: Medicare Other

## 2022-07-06 DIAGNOSIS — I441 Atrioventricular block, second degree: Secondary | ICD-10-CM | POA: Diagnosis not present

## 2022-07-07 LAB — CUP PACEART REMOTE DEVICE CHECK
Battery Remaining Longevity: 43 mo
Battery Remaining Percentage: 46 %
Battery Voltage: 2.98 V
Brady Statistic AP VP Percent: 21 %
Brady Statistic AP VS Percent: 1 %
Brady Statistic AS VP Percent: 77 %
Brady Statistic AS VS Percent: 1 %
Brady Statistic RA Percent Paced: 20 %
Date Time Interrogation Session: 20230720232329
Implantable Lead Implant Date: 20160822
Implantable Lead Implant Date: 20160822
Implantable Lead Implant Date: 20190118
Implantable Lead Location: 753858
Implantable Lead Location: 753859
Implantable Lead Location: 753860
Implantable Pulse Generator Implant Date: 20190118
Lead Channel Impedance Value: 1175 Ohm
Lead Channel Impedance Value: 390 Ohm
Lead Channel Impedance Value: 560 Ohm
Lead Channel Pacing Threshold Amplitude: 1 V
Lead Channel Pacing Threshold Amplitude: 1.5 V
Lead Channel Pacing Threshold Amplitude: 1.75 V
Lead Channel Pacing Threshold Pulse Width: 0.5 ms
Lead Channel Pacing Threshold Pulse Width: 0.5 ms
Lead Channel Pacing Threshold Pulse Width: 0.5 ms
Lead Channel Sensing Intrinsic Amplitude: 1.9 mV
Lead Channel Sensing Intrinsic Amplitude: 12 mV
Lead Channel Setting Pacing Amplitude: 2 V
Lead Channel Setting Pacing Amplitude: 2.5 V
Lead Channel Setting Pacing Amplitude: 3.25 V
Lead Channel Setting Pacing Pulse Width: 0.5 ms
Lead Channel Setting Pacing Pulse Width: 0.5 ms
Lead Channel Setting Sensing Sensitivity: 4 mV
Pulse Gen Model: 3262
Pulse Gen Serial Number: 8983138

## 2022-07-09 NOTE — Patient Instructions (Incomplete)
Good to see you again today- please see me in about 6 months assuming all is well  Cut down to one glipizide tablet-  If you continue to have some lows we may need to stop this entirely   Consider getting the shingles vaccine series at your pharmacy if not done already

## 2022-07-09 NOTE — Progress Notes (Unsigned)
[place] at [facility] [address], [place], [place] [address] [phone]  Date:  07/12/2022   Name:  Michael Treu Sr.   DOB:  [birth year]   MRN:  [id number]  PCP:  Darreld Mclean, MD    Chief Complaint: 6 month follow up (Concerns/ questions: 1. Pt says Oncology might want to restart Tx. In about 8 months.  2. Reflux. 3.Sore muscles. 4. Refill on Entresto/Shingrix: none/Eye: UTD John Scott/Foot exam due)   History of Present Illness:  Michael Wulff Sr. is a 76 y.o. 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 and also walks on his treadmill  Seen by urology in March Hematology visit in June- stable smoldering IgG kappa myeloma  Nephrology 3/23- DR Shona Needles- recommended  Eye exam- done about one month ago  Foot exam is due- will update today   Amlodipine 2.5 Lipitor 20 Carvedilol Entresto Glipizide Metformin  Lab Results  Component Value Date   HGBA1C 8.1 (H) 01/11/2022     Patient Active Problem List   Diagnosis Date Noted   Chronic kidney disease (CKD), active medical management without dialysis 10/09/2019   Iron deficiency anemia due to chronic blood loss 29/52/8413   Chronic systolic (congestive) heart failure ([place]) 01/04/2018   Pacemaker 12/08/2015   Mobitz type 2 second degree heart block 08/08/2015   Right bundle  branch block 12/16/2014   Syncope 12/16/2014   Proteinuria due to type 2 diabetes mellitus ([place]) 11/12/2014   Encounter for therapeutic drug monitoring 03/13/2013   Multiple myeloma ([place]) 02/15/2013   Edema 01/07/2013   Diabetes mellitus due to underlying condition with other diabetic kidney complication ([facility])    Hyperlipidemia    Hypertension    MGUS (monoclonal gammopathy of unknown significance)    GERD (gastroesophageal reflux disease)     Past Medical History:  Diagnosis Date   Anemia    Cataract    Chronic systolic (congestive) heart failure ([facility])    Diabetes mellitus    GERD (gastroesophageal reflux disease)    Hyperlipidemia    Hypertension    Iron deficiency anemia due to chronic blood loss 10/02/2019   MGUS (monoclonal gammopathy of unknown significance)    Multiple myeloma ([place]) 02/2013   normal cytogenetics and FISH panel on 03/11/2013.    Osteoarthritis    Presence of permanent cardiac pacemaker    Right bundle branch block    Syncope     Past Surgical History:  Procedure Laterality Date   BIV UPGRADE  01/04/2018   BIV UPGRADE N/A 01/04/2018   Procedure: BIVP UPGRADE;  Surgeon: [name], MD;  Location: [place] [facility];  Service: Cardiovascular;  Laterality: N/A;   EP IMPLANTABLE DEVICE N/A 08/09/2015   Procedure: Pacemaker Implant;  Surgeon: [name], MD;  Location: [place] [facility];  Service: Cardiovascular;  Laterality: N/A;   EYE SURGERY      Social History   Tobacco Use   Smoking status: Former    Packs/day: 2.00    Years: 35.00    Total pack years: 70.00    Types: Cigarettes    Start date: 03/26/1963    Quit date: 12/18/1997    Years since quitting: 24.5   Smokeless tobacco: Never   Tobacco comments:    quit 15 years  Vaping Use   Vaping Use: Never used  Substance Use Topics   Alcohol use: No    Alcohol/week: 0.0 standard drinks of alcohol   Drug use: No    Family History  Problem Relation Age of Onset   Hypertension Mother     Cancer Mother    Stroke Mother    Hypertension Sister    Hypertension Brother    Hypertension Maternal Grandmother     Allergies  Allergen Reactions   Amlodipine Swelling   Crestor [Rosuvastatin] Other (See Comments)    Per pt unable to focus    Medication list has been reviewed and updated.  Current Outpatient Medications on File Prior to Visit  Medication Sig Dispense Refill   albuterol (VENTOLIN HFA) 108 (90 Base) MCG/ACT inhaler TAKE 2 PUFFS BY MOUTH EVERY 6 HOURS AS NEEDED FOR WHEEZE OR SHORTNESS OF BREATH 8.5 each 1   amLODipine (NORVASC) 2.5 MG tablet TAKE 1 TABLET BY MOUTH EVERY DAY 90 tablet 1   atorvastatin (LIPITOR) 20 MG tablet TAKE 1 TABLET BY MOUTH EVERY DAY 90 tablet 3   carvedilol (COREG) 25 MG tablet TAKE 1.5 TABLETS (37.5 MG TOTAL) BY MOUTH 2 (TWO) TIMES DAILY WITH A MEAL. 270 tablet 3   ENTRESTO 49-51 MG TAKE 1 TABLET BY MOUTH TWICE A DAY 180 tablet 3   ferrous sulfate 325 (65 FE) MG tablet Take 325 mg by mouth every other day.     Ginkgo Biloba 40 MG TABS Take 1 tablet by mouth daily.      glipiZIDE (GLUCOTROL XL) 5 MG 24 hr tablet TAKE 2 TABLETS BY MOUTH DAILY WITH BREAKFAST OR AS DIRECTED 180 tablet 0   latanoprost (XALATAN) 0.005 % ophthalmic solution Place 1 drop into both eyes at bedtime.      metFORMIN (GLUCOPHAGE) 1000 MG tablet TAKE 1 TABLET (1,000 MG TOTAL) BY MOUTH 2 (TWO) TIMES DAILY WITH A MEAL. 180 tablet 1   Multiple Vitamin (MULTIVITAMIN WITH MINERALS) TABS Take 1 tablet by mouth daily.      Omega-3 Fatty Acids (FISH OIL BURP-LESS PO) Take 1 capsule by mouth every other day.      ONETOUCH ULTRA test strip USE AS INSTRUCTED 100 strip 12   tamsulosin (FLOMAX) 0.4 MG CAPS capsule Take 0.4 mg by mouth at bedtime.     No current facility-administered medications on file prior to visit.    Review of Systems:  As per HPI- otherwise negative.   Physical Examination: Vitals:   07/12/22 1442  BP: 132/80  Pulse: 60  Resp: 18  Temp: 97.6 F  (36.4 C)  SpO2: 98%   Vitals:   07/12/22 1442  Weight: 209 lb 9.6 oz (95.1 kg)  Height: $Remove'6\' 3"'[id number]$  (1.905 m)   Body mass index is 26.2 kg/m. Ideal Body Weight: Weight in (lb) to have BMI = 25: 199.6  GEN: no acute distress.  Mild overweight. Looks well  HEENT: Atraumatic, Normocephalic.  Ears and Nose: No external deformity. CV: RRR, No M/G/R. No JVD. No thrill. No extra heart  sounds. PULM: CTA B, no wheezes, crackles, rhonchi. No retractions. No resp. distress. No accessory muscle use. ABD: S, NT, ND, +BS. No rebound. No HSM. EXTR: No c/c/e PSYCH: Normally interactive. Conversant.  Normal foot exam today   Assessment and Plan: Multiple myeloma, remission status unspecified ([place])  Primary hypertension  Type 2 diabetes mellitus with chronic kidney disease, without long-term current use of insulin, unspecified CKD stage ([place]) - Plan: Hemoglobin A1c  Hyperlipidemia, unspecified hyperlipidemia type  Chronic systolic (congestive) heart failure ([place])  Continue hematology follow-up for MM BP under good control- he is checking at home Discussed glucose- with low glucose on occasion asked him to cut glipizide to one daily for now .  Await a1c  Encouraged shingrix Recheck 6 months assuming all is well    Signed Lamar Blinks, MD  Addendum 7/27, received labs as below.  Message to patient Results for orders placed or performed in visit on 07/12/22  Hemoglobin A1c  Result Value Ref Range   Hgb A1c MFr Bld 7.8 (H) 4.6 - 6.5 %

## 2022-07-11 ENCOUNTER — Other Ambulatory Visit: Payer: Self-pay | Admitting: Family Medicine

## 2022-07-11 DIAGNOSIS — I10 Essential (primary) hypertension: Secondary | ICD-10-CM

## 2022-07-11 DIAGNOSIS — I5022 Chronic systolic (congestive) heart failure: Secondary | ICD-10-CM

## 2022-07-12 ENCOUNTER — Ambulatory Visit (INDEPENDENT_AMBULATORY_CARE_PROVIDER_SITE_OTHER): Payer: Medicare Other | Admitting: Family Medicine

## 2022-07-12 VITALS — BP 132/80 | HR 60 | Temp 97.6°F | Resp 18 | Ht 75.0 in | Wt 209.6 lb

## 2022-07-12 DIAGNOSIS — E785 Hyperlipidemia, unspecified: Secondary | ICD-10-CM

## 2022-07-12 DIAGNOSIS — E1122 Type 2 diabetes mellitus with diabetic chronic kidney disease: Secondary | ICD-10-CM

## 2022-07-12 DIAGNOSIS — I5022 Chronic systolic (congestive) heart failure: Secondary | ICD-10-CM

## 2022-07-12 DIAGNOSIS — I10 Essential (primary) hypertension: Secondary | ICD-10-CM | POA: Diagnosis not present

## 2022-07-12 DIAGNOSIS — C9000 Multiple myeloma not having achieved remission: Secondary | ICD-10-CM | POA: Diagnosis not present

## 2022-07-13 ENCOUNTER — Encounter: Payer: Self-pay | Admitting: Family Medicine

## 2022-07-13 LAB — HEMOGLOBIN A1C: Hgb A1c MFr Bld: 7.8 % — ABNORMAL HIGH (ref 4.6–6.5)

## 2022-07-17 MED ORDER — EMPAGLIFLOZIN 10 MG PO TABS: 10.0000 mg | ORAL_TABLET | Freq: Every day | ORAL | 3 refills | Status: DC

## 2022-07-17 NOTE — Addendum Note (Signed)
Addended by: Lamar Blinks C on: 07/17/2022 08:51 AM   Modules accepted: Orders

## 2022-07-18 ENCOUNTER — Telehealth: Payer: Self-pay | Admitting: Pharmacist

## 2022-07-18 NOTE — Telephone Encounter (Signed)
   Pharmacy called with above information. Processed and patient's cost was $0 for [place].

## 2022-07-18 NOTE — Telephone Encounter (Signed)
Patient called back but was not able to take call at the time.

## 2022-07-18 NOTE — Telephone Encounter (Signed)
Patient appearing on report for [employer] [facility] - Hypertension Control report due to previous blood of 142/80 on 01/11/2022. Blood pressure was checked 07/12/2022 and had improved to BP 132/80. No intervention needed regarding blood pressure however noted that patient reported difficulty with cost of Entresto and was hesitant to start Jardiance recommended by Dr Lorelei Pont.  Tried to contact patient to offer screening for possible assistance with cost of branded medications.  Unable to reach patient. LM on VM.   Cherre Robins, PharmD Clinical Pharmacist [facility]

## 2022-07-24 ENCOUNTER — Encounter: Payer: Self-pay | Admitting: Hematology & Oncology

## 2022-07-24 NOTE — Progress Notes (Signed)
Remote pacemaker transmission.   

## 2022-07-27 DIAGNOSIS — R3914 Feeling of incomplete bladder emptying: Secondary | ICD-10-CM | POA: Diagnosis not present

## 2022-08-17 ENCOUNTER — Encounter: Payer: Self-pay | Admitting: Pharmacist

## 2022-08-17 ENCOUNTER — Ambulatory Visit: Payer: Medicare Other | Admitting: Pharmacist

## 2022-08-17 DIAGNOSIS — E1122 Type 2 diabetes mellitus with diabetic chronic kidney disease: Secondary | ICD-10-CM

## 2022-08-17 DIAGNOSIS — I5022 Chronic systolic (congestive) heart failure: Secondary | ICD-10-CM

## 2022-08-17 NOTE — Chronic Care Management (AMB) (Signed)
Care Management   Pharmacy Note  08/17/2022 Name: Michael Higham Sr. MRN: [id number] DOB: [birth year]  Subjective: Michael Gendreau Sr. is a 76 y.o. year old male who is a primary care patient of [name], [name], MD. The Care Management team was consulted for assistance with care coordination needs regarding medication cost, diabetes and CHF. At our last visit assisted with applying for CHF disease state funds with [employer]. Patient has been able to remain on his Vania Rea and Delene Loll thanks to this fund.  Patient reports he is not taking Jardiance very day though. He states when he took it every day he felt exhausted and sluggish. Reports blood glucose has been 110 to 130. He past has had hypoglycemia but not in the last 2 weeks.  He is also taking glipizide $RemoveBeforeDE'5mg'eBfZcmMTBsmuMZm$ ER in the evening every other day (suppose to take $Remov'5mg'AvvDUD$  - 2 tablets daily per med list.   Engaged with patient by telephone for follow up visit in response to provider referral for pharmacy case management and/or care coordination services.    Patient agreed to services and consent obtained at previous visit  Assessment:  Review of patient status, including review of consultants reports, laboratory and other test data, was performed as part of comprehensive evaluation and provision of chronic care management services.   SDOH (Social Determinants of Health) assessments and interventions performed:    Objective:  Lab Results  Component Value Date   CREATININE 1.38 (H) 06/05/2022   CREATININE 1.21 10/04/2021   CREATININE 1.26 07/06/2021    Lab Results  Component Value Date   HGBA1C 7.8 (H) 07/12/2022       Component Value Date/Time   CHOL 119 01/11/2022 1432   CHOL 151 12/26/2017 0847   TRIG 79.0 01/11/2022 1432   HDL 32.90 (L) 01/11/2022 1432   HDL 34 (L) 12/26/2017 0847   CHOLHDL 4 01/11/2022 1432   VLDL 15.8 01/11/2022 1432   LDLCALC 70 01/11/2022 1432   LDLCALC 104 (H) 12/26/2017 0847    Other: (TSH,  CBC, Vit D, etc.)  Clinical ASCVD: Yes  The ASCVD Risk score (Arnett DK, et al., 2019) failed to calculate for the following reasons:   The valid total cholesterol range is 130 to 320 mg/dL    Other: (CHADS2VASc if Afib, PHQ9 if depression, MMRC or CAT for COPD, ACT, DEXA)  BP Readings from Last 3 Encounters:  07/12/22 132/80  06/05/22 (!) 161/93  01/11/22 (!) 142/80    Care Plan  Allergies  Allergen Reactions   Amlodipine Swelling   Crestor [Rosuvastatin] Other (See Comments)    Per pt unable to focus    Medications Reviewed Today     Reviewed by Cherre Robins, RPH-CPP (Pharmacist) on 08/17/22 at 1443  Med List Status: <None>   Medication Order Taking? Sig Documenting Provider Last Dose Status Informant  albuterol (VENTOLIN HFA) 108 (90 Base) MCG/ACT inhaler [id number] Yes TAKE 2 PUFFS BY MOUTH EVERY 6 HOURS AS NEEDED FOR WHEEZE OR SHORTNESS OF BREATH [name], [name], MD Taking Active   amLODipine (NORVASC) 2.5 MG tablet [id number] Yes TAKE 1 TABLET BY MOUTH EVERY DAY [name], [name], MD Taking Active   atorvastatin (LIPITOR) 20 MG tablet [id number] Yes TAKE 1 TABLET BY MOUTH EVERY DAY [name], MD Taking Active   carvedilol (COREG) 25 MG tablet [id number] Yes TAKE 1.5 TABLETS (37.5 MG TOTAL) BY MOUTH 2 (TWO) TIMES DAILY WITH A MEAL. [name], MD Taking Active   empagliflozin (JARDIANCE) 10 MG TABS  tablet [id number] Yes Take 1 tablet (10 mg total) by mouth daily before breakfast. [name], [name], MD Taking Active   ENTRESTO 49-51 MG [id number] Yes TAKE 1 TABLET BY MOUTH TWICE A DAY [name], [name], MD Taking Active   ferrous sulfate 325 (65 FE) MG tablet [id number] Yes Take 325 mg by mouth every other day. [provider] Taking Active Self  Ginkgo Biloba 40 MG TABS [id number] Yes Take 1 tablet by mouth daily.  [provider] Taking Active Self  glipiZIDE (GLUCOTROL XL) 5 MG 24 hr tablet [id number] Yes TAKE 2 TABLETS BY MOUTH DAILY WITH  BREAKFAST OR AS DIRECTED  Patient taking differently: Take 5 mg by mouth every other day.   [name], [name], MD Taking Active   latanoprost (XALATAN) 0.005 % ophthalmic solution [id number] Yes Place 1 drop into both eyes at bedtime.  [provider] Taking Active Self  metFORMIN (GLUCOPHAGE) 1000 MG tablet [id number] Yes TAKE 1 TABLET (1,000 MG TOTAL) BY MOUTH 2 (TWO) TIMES DAILY WITH A MEAL. [name], [name], MD Taking Active   Multiple Vitamin (MULTIVITAMIN WITH MINERALS) TABS [id number] Yes Take 1 tablet by mouth daily.  [provider] Taking Active Self  Omega-3 Fatty Acids (FISH OIL BURP-LESS PO) [id number] Yes Take 1 capsule by mouth every other day.  [provider] Taking Active Self  Donald Siva test strip [id number] Yes USE AS INSTRUCTED [name], [name], MD Taking Active   tamsulosin (FLOMAX) 0.4 MG CAPS capsule [id number] Yes Take 0.4 mg by mouth at bedtime. [provider] Taking Active             Patient Active Problem List   Diagnosis Date Noted   Chronic kidney disease (CKD), active medical management without dialysis 10/09/2019   Iron deficiency anemia due to chronic blood loss 70/96/4383   Chronic systolic (congestive) heart failure ([place]) 01/04/2018   Pacemaker 12/08/2015   Mobitz type 2 second degree heart block 08/08/2015   Right bundle branch block 12/16/2014   Syncope 12/16/2014   Proteinuria due to type 2 diabetes mellitus ([place]) 11/12/2014   Encounter for therapeutic drug monitoring 03/13/2013   Multiple myeloma ([place]) 02/15/2013   Edema 01/07/2013   Diabetes mellitus due to underlying condition with other diabetic kidney complication (HCC)    Hyperlipidemia    Hypertension    MGUS (monoclonal gammopathy of unknown significance)    GERD (gastroesophageal reflux disease)     Medication Assistance:   Patient has been approved for [employer] cardiomyopathy / CHF funds. He is getting [place] and Entresto with  this fund.  Assessment: type 2 DM. Last A1c not at goal but home blood glucose readings show improving blood glucose but patient is not taking medications as prescribed.  CHF: controlled.   Plan: Recommended trial of taking Jardiance 10mg  every day due to benefits for blood glucose and heart. Recommended patient hold glipizide for now. If blood glucose increased to over 180 he should call office and restart glipizide ER 5mg  daily.  Continue metformin 1000mg  twice a day.   Follow Up:  Patient agrees to Care Plan and Follow-up.  Plan: Telephone follow up appointment with care management team member scheduled for:  2 to 4 weeks.  Cherre Robins, PharmD Clinical Pharmacist [facility]

## 2022-08-24 ENCOUNTER — Other Ambulatory Visit: Payer: Self-pay | Admitting: Family Medicine

## 2022-08-24 DIAGNOSIS — C9000 Multiple myeloma not having achieved remission: Secondary | ICD-10-CM

## 2022-09-01 ENCOUNTER — Ambulatory Visit: Payer: Medicare Other | Admitting: Pharmacist

## 2022-09-01 DIAGNOSIS — E1122 Type 2 diabetes mellitus with diabetic chronic kidney disease: Secondary | ICD-10-CM

## 2022-09-01 NOTE — Chronic Care Management (AMB) (Signed)
Care Coordination  Pharmacy Note  09/01/2022 Name: Michael Llanas Sr. MRN: [id number] DOB: [birth year]  Subjective: Michael Aubry Sr. is a 76 y.o. year old male who is a primary care patient of [name], [name], MD. Clinical Pharmacist Practitioner was consulted for assistance with care coordination needs regarding medication cost, diabetes and CHF. Assisted with applying for CHF disease state funds with [facility] in August 2023. Patient was approved. Patient has been able to remain on his Vania Rea and Delene Loll thanks to this fund.  Patient reports he has been taking Jardiance $RemoveBeforeDE'10mg'ygJyroXFMnZlilF$  daily since our last phone visit. He is holding glipizide due to reports of low blood glucose in afternoon. Continues to take metformin $RemoveBefore'1000mg'pEtRkrKtNzZjj$  twice a day. Home blood glucose has been more stable recently. Blood glucose 174, 119, 80, 141 and 110.  He reports he is no longer feeling sluggish like he did 2 weeks ago and no longer having low blood glucose in the afternoon.   Engaged with patient by telephone for follow up visit in response to provider referral for pharmacy case management and/or care coordination services.    Patient agreed to services and consent obtained at previous visit  Assessment:  Review of patient status, including review of consultants reports, laboratory and other test data, was performed as part of comprehensive evaluation and provision of chronic care management services.   SDOH (Social Determinants of Health) assessments and interventions performed:    Objective:  Lab Results  Component Value Date   CREATININE 1.38 (H) 06/05/2022   CREATININE 1.21 10/04/2021   CREATININE 1.26 07/06/2021    Lab Results  Component Value Date   HGBA1C 7.8 (H) 07/12/2022       Component Value Date/Time   CHOL 119 01/11/2022 1432   CHOL 151 12/26/2017 0847   TRIG 79.0 01/11/2022 1432   HDL 32.90 (L) 01/11/2022 1432   HDL 34 (L) 12/26/2017 0847   CHOLHDL 4 01/11/2022 1432   VLDL  15.8 01/11/2022 1432   LDLCALC 70 01/11/2022 1432   LDLCALC 104 (H) 12/26/2017 0847    Other: (TSH, CBC, Vit D, etc.)  Clinical ASCVD: Yes  The ASCVD Risk score (Arnett DK, et al., 2019) failed to calculate for the following reasons:   The valid total cholesterol range is 130 to 320 mg/dL    Other: (CHADS2VASc if Afib, PHQ9 if depression, MMRC or CAT for COPD, ACT, DEXA)  BP Readings from Last 3 Encounters:  07/12/22 132/80  06/05/22 (!) 161/93  01/11/22 (!) 142/80    Care Plan  Allergies  Allergen Reactions   Amlodipine Swelling   Crestor [Rosuvastatin] Other (See Comments)    Per pt unable to focus    Medications Reviewed Today     Reviewed by Cherre Robins, RPH-CPP (Pharmacist) on 09/01/22 at 15  Med List Status: <None>   Medication Order Taking? Sig Documenting Provider Last Dose Status Informant  albuterol (VENTOLIN HFA) 108 (90 Base) MCG/ACT inhaler [id number] Yes TAKE 2 PUFFS BY MOUTH EVERY 6 HOURS AS NEEDED FOR WHEEZE OR SHORTNESS OF BREATH [name], [name], MD Taking Active   amLODipine (NORVASC) 2.5 MG tablet [id number] Yes TAKE 1 TABLET BY MOUTH EVERY DAY [name], [name], MD Taking Active   atorvastatin (LIPITOR) 20 MG tablet [id number] Yes TAKE 1 TABLET BY MOUTH EVERY DAY [name], MD Taking Active   carvedilol (COREG) 25 MG tablet [id number] Yes TAKE 1.5 TABLETS (37.5 MG TOTAL) BY MOUTH 2 (TWO) TIMES DAILY WITH A MEAL.  Patient taking differently:  Take 25 mg by mouth 2 (two) times daily with a meal.   [name], MD Taking Active   empagliflozin (JARDIANCE) 10 MG TABS tablet [id number] Yes Take 1 tablet (10 mg total) by mouth daily before breakfast. [name], [name], MD Taking Active   ENTRESTO 49-51 MG [id number] Yes TAKE 1 TABLET BY MOUTH TWICE A DAY [name], [name], MD Taking Active   ferrous sulfate 325 (65 FE) MG tablet [id number] Yes Take 325 mg by mouth every other day. [provider] Taking Active Self  Ginkgo Biloba 40 MG TABS  [id number] Yes Take 1 tablet by mouth daily.  [provider] Taking Active Self  glipiZIDE (GLUCOTROL XL) 5 MG 24 hr tablet [id number] No TAKE 2 TABLETS BY MOUTH DAILY WITH BREAKFAST OR AS DIRECTED  Patient not taking: Reported on 09/01/2022   [name], [name], MD Not Taking Active   latanoprost (XALATAN) 0.005 % ophthalmic solution [id number] Yes Place 1 drop into both eyes at bedtime.  [provider] Taking Active Self  metFORMIN (GLUCOPHAGE) 1000 MG tablet [id number] Yes TAKE 1 TABLET (1,000 MG TOTAL) BY MOUTH 2 (TWO) TIMES DAILY WITH A MEAL. [name], [name], MD Taking Active   Multiple Vitamin (MULTIVITAMIN WITH MINERALS) TABS [id number] Yes Take 1 tablet by mouth daily.  [provider] Taking Active Self  Omega-3 Fatty Acids (FISH OIL BURP-LESS PO) [id number] Yes Take 1 capsule by mouth every other day.  [provider] Taking Active Self  Donald Siva test strip [id number] Yes USE AS INSTRUCTED [name], [name], MD Taking Active   tamsulosin (FLOMAX) 0.4 MG CAPS capsule [id number] Yes Take 0.4 mg by mouth at bedtime. [provider] Taking Active             Patient Active Problem List   Diagnosis Date Noted   Chronic kidney disease (CKD), active medical management without dialysis 10/09/2019   Iron deficiency anemia due to chronic blood loss 09/38/1829   Chronic systolic (congestive) heart failure ([place]) 01/04/2018   Pacemaker 12/08/2015   Mobitz type 2 second degree heart block 08/08/2015   Right bundle branch block 12/16/2014   Syncope 12/16/2014   Proteinuria due to type 2 diabetes mellitus ([place]) 11/12/2014   Encounter for therapeutic drug monitoring 03/13/2013   Multiple myeloma ([place]) 02/15/2013   Edema 01/07/2013   Diabetes mellitus due to underlying condition with other diabetic kidney complication (HCC)    Hyperlipidemia    Hypertension    MGUS (monoclonal gammopathy of unknown significance)    GERD (gastroesophageal reflux  disease)     Medication Assistance:   Patient has been approved for [employer] cardiomyopathy / CHF funds. He is getting [place] and Entresto with this fund.  Assessment: type 2 DM. Last A1c not at goal but home blood glucose readings show improving blood glucose but patient is not taking medications as prescribed.  CHF: controlled.   Plan: Recommended continueJardiance 10mg  every day due to benefits for blood glucose and heart. Continue to hold glipizide for now. Patient to call if blood glucose is > 200.  Continue metformin 1000mg  twice a day.   Follow Up:  Patient agrees to Care Plan and Follow-up.  Plan: Telephone follow up appointment with care management team member scheduled for:  6 to 8 weeks.  Cherre Robins, PharmD Clinical Pharmacist [facility]

## 2022-09-21 ENCOUNTER — Encounter: Payer: Self-pay | Admitting: Family Medicine

## 2022-10-05 ENCOUNTER — Ambulatory Visit (INDEPENDENT_AMBULATORY_CARE_PROVIDER_SITE_OTHER): Payer: Medicare Other

## 2022-10-05 DIAGNOSIS — Z950 Presence of cardiac pacemaker: Secondary | ICD-10-CM | POA: Diagnosis not present

## 2022-10-05 DIAGNOSIS — I441 Atrioventricular block, second degree: Secondary | ICD-10-CM

## 2022-10-06 LAB — CUP PACEART REMOTE DEVICE CHECK
Battery Remaining Longevity: 38 mo
Battery Remaining Percentage: 43 %
Battery Voltage: 2.98 V
Brady Statistic AP VP Percent: 21 %
Brady Statistic AP VS Percent: 1 %
Brady Statistic AS VP Percent: 78 %
Brady Statistic AS VS Percent: 1 %
Brady Statistic RA Percent Paced: 20 %
Date Time Interrogation Session: 20231020000011
Implantable Lead Implant Date: 20160822
Implantable Lead Implant Date: 20160822
Implantable Lead Implant Date: 20190118
Implantable Lead Location: 753858
Implantable Lead Location: 753859
Implantable Lead Location: 753860
Implantable Pulse Generator Implant Date: 20190118
Lead Channel Impedance Value: 1175 Ohm
Lead Channel Impedance Value: 390 Ohm
Lead Channel Impedance Value: 540 Ohm
Lead Channel Pacing Threshold Amplitude: 0.625 V
Lead Channel Pacing Threshold Amplitude: 1.125 V
Lead Channel Pacing Threshold Amplitude: 1.875 V
Lead Channel Pacing Threshold Pulse Width: 0.5 ms
Lead Channel Pacing Threshold Pulse Width: 0.5 ms
Lead Channel Pacing Threshold Pulse Width: 0.5 ms
Lead Channel Sensing Intrinsic Amplitude: 1.1 mV
Lead Channel Sensing Intrinsic Amplitude: 12 mV
Lead Channel Setting Pacing Amplitude: 1.625
Lead Channel Setting Pacing Amplitude: 2.125
Lead Channel Setting Pacing Amplitude: 2.875
Lead Channel Setting Pacing Pulse Width: 0.5 ms
Lead Channel Setting Pacing Pulse Width: 0.5 ms
Lead Channel Setting Sensing Sensitivity: 4 mV
Pulse Gen Model: 3262
Pulse Gen Serial Number: 8983138

## 2022-10-13 NOTE — Progress Notes (Signed)
Remote pacemaker transmission.   

## 2022-10-16 ENCOUNTER — Encounter (INDEPENDENT_AMBULATORY_CARE_PROVIDER_SITE_OTHER): Payer: Self-pay

## 2022-10-17 ENCOUNTER — Other Ambulatory Visit: Payer: Self-pay | Admitting: Family Medicine

## 2022-10-27 ENCOUNTER — Encounter: Payer: Self-pay | Admitting: Pharmacist

## 2022-10-27 ENCOUNTER — Ambulatory Visit (INDEPENDENT_AMBULATORY_CARE_PROVIDER_SITE_OTHER): Payer: Medicare Other | Admitting: Pharmacist

## 2022-10-27 DIAGNOSIS — I5022 Chronic systolic (congestive) heart failure: Secondary | ICD-10-CM

## 2022-10-27 DIAGNOSIS — E1122 Type 2 diabetes mellitus with diabetic chronic kidney disease: Secondary | ICD-10-CM

## 2022-10-27 DIAGNOSIS — I10 Essential (primary) hypertension: Secondary | ICD-10-CM

## 2022-10-27 NOTE — Progress Notes (Signed)
Pharmacy Note  10/27/2022 Name: Michael Cederberg Sr. MRN: [id number] DOB: 03/19/1946  Subjective: Michael Nardelli Sr. is a 76 y.o. year old male who is a primary care patient of [name], [name], MD. Clinical Pharmacist Practitioner referral was placed to assist with medication, CHF and diabetes management.    Engaged with patient by telephone for follow up visit today.  CHF - denies shortness of breath. Weight has actually decreased a little per patient. Current weight it 204 lbs (was 210lbs in July 2023). He has been taking Entresto regularly since he was approved to get assistance with med cost from [employer].  Type 2 DM - Patient reports blood glucose usually 130's each morning. Checked while on the phone and was 22 - states he had a large breakfast at [employer] about 2 hours ago - eggs, [place] bacon, grits and 2 slices of toast. He is currently taking Jardiance 72m daily and metformin 10053mtwice a day. He stopped glipizide due to low blood glucose in the afternoon.   Hypertension - In October he sent a message to PCP that blood pressure was low - 87/65 and 75/53. Patient stopped amlodipine 2.30m33mn his own. He take Entresto and carvedilol 230m59md for CHF and hypertension. Today he reports blood pressure has been 130 to 140 / 80.   Objective: Review of patient status, including review of consultants reports, laboratory and other test data, was performed as part of comprehensive evaluation and provision of chronic care management services.   Lab Results  Component Value Date   CREATININE 1.38 (H) 06/05/2022   CREATININE 1.21 10/04/2021   CREATININE 1.26 07/06/2021    Lab Results  Component Value Date   HGBA1C 7.8 (H) 07/12/2022       Component Value Date/Time   CHOL 119 01/11/2022 1432   CHOL 151 12/26/2017 0847   TRIG 79.0 01/11/2022 1432   HDL 32.90 (L) 01/11/2022 1432   HDL 34 (L) 12/26/2017 0847   CHOLHDL 4 01/11/2022 1432   VLDL 15.8  01/11/2022 1432   LDLCALC 70 01/11/2022 1432   LDLCALC 104 (H) 12/26/2017 0847     Clinical ASCVD: Yes  The ASCVD Risk score (Arnett DK, et al., 2019) failed to calculate for the following reasons:   The valid total cholesterol range is 130 to 320 mg/dL    BP Readings from Last 3 Encounters:  07/12/22 132/80  06/05/22 (!) 161/93  01/11/22 (!) 142/80     Allergies  Allergen Reactions   Amlodipine Swelling   Crestor [Rosuvastatin] Other (See Comments)    Per pt unable to focus    Medications Reviewed Today     Reviewed by EckaCherre RobinsH-CPP (Pharmacist) on 10/27/22 at 1324  Med List Status: <None>   Medication Order Taking? Sig Documenting Provider Last Dose Status Informant  albuterol (VENTOLIN HFA) 108 (90 Base) MCG/ACT inhaler [id number] TAKE 2 PUFFS BY MOUTH EVERY 6 HOURS AS NEEDED FOR WHEEZE OR SHORTNESS OF BREATH [name], JessGay Filler Taking Active   atorvastatin (LIPITOR) 20 MG tablet [id number] TAKE 1 TABLET BY MOUTH EVERY DAY TaylEvans Lance Taking Active   carvedilol (COREG) 25 MG tablet [id number] TAKE 1.5 TABLETS (37.5 MG TOTAL) BY MOUTH 2 (TWO) TIMES DAILY WITH A MEAL.  Patient taking differently: Take 25 mg by mouth 2 (two) times daily with a meal.   TaylEvans Lance Taking Active   empagliflozin (JARDIANCE) 10 MG TABS tablet [id number]  Take 1 tablet (10 mg total) by mouth daily before breakfast. [name], [name], MD Taking Active   ENTRESTO 49-51 MG [id number] Yes TAKE 1 TABLET BY MOUTH TWICE A DAY [name], [name], MD Taking Active   ferrous sulfate 325 (65 FE) MG tablet [id number] Yes Take 325 mg by mouth every other day. [provider] Taking Active Self  Ginkgo Biloba 40 MG TABS [id number] Yes Take 1 tablet by mouth daily.  [provider] Taking Active Self  latanoprost (XALATAN) 0.005 % ophthalmic solution [id number] Yes Place 1 drop into both eyes at bedtime.  [provider] Taking Active Self  metFORMIN  (GLUCOPHAGE) 1000 MG tablet [id number] Yes Take 1 tablet (1,000 mg total) by mouth 2 (two) times daily with a meal. [name], [name], MD Taking Active   Multiple Vitamin (MULTIVITAMIN WITH MINERALS) TABS [id number] Yes Take 1 tablet by mouth daily.  [provider] Taking Active Self  Omega-3 Fatty Acids (FISH OIL BURP-LESS PO) [id number] Yes Take 1 capsule by mouth every other day.  [provider] Taking Active Self  Donald Siva test strip [id number] Yes USE AS INSTRUCTED [name], [name], MD Taking Active   tamsulosin (FLOMAX) 0.4 MG CAPS capsule [id number] Yes Take 0.4 mg by mouth at bedtime. [provider] Taking Active             Patient Active Problem List   Diagnosis Date Noted   Chronic kidney disease (CKD), active medical management without dialysis 10/09/2019   Iron deficiency anemia due to chronic blood loss 07/86/7544   Chronic systolic (congestive) heart failure ([place]) 01/04/2018   Pacemaker 12/08/2015   Mobitz type 2 second degree heart block 08/08/2015   Right bundle branch block 12/16/2014   Syncope 12/16/2014   Proteinuria due to type 2 diabetes mellitus ([place]) 11/12/2014   Encounter for therapeutic drug monitoring 03/13/2013   Multiple myeloma ([place]) 02/15/2013   Edema 01/07/2013   Diabetes mellitus due to underlying condition with other diabetic kidney complication (HCC)    Hyperlipidemia    Hypertension    MGUS (monoclonal gammopathy of unknown significance)    GERD (gastroesophageal reflux disease)      Medication Assistance:   Approved for cardiomyopathy funds thru [employer] - available thru 06/18/2023   Assessment / Plan: CHF - controlled Continue Entresto, Jardiance and carvedilol.  Type 2 DM - blood glucose was elevated today. Last A1c not at goal of < 7.0% Check blood glucose in afternoon / after meals 2 or 3 times per week. Ok to check fasting blood glucose other days.  Reviwed blood pressure goals.   Discussed limiting CHO in diet (specifically bread, grits / grains, potatoes, pasta and rice)  Send blood glucose readings to me thru MyChart or call office to report them.  If blood glucose not at goal will consider increasing Jardiance to 72mdaily (could help with blood glucose and blood pressure)  Continue Jardiance 113mand metformin 1000 mg bid for now.     Hypertension - goal blood pressure < 130/80 Continue to check blood pressure daily.  Continue Entresto and carvedilol 2514mid for CHF and hypertension.   Health Maintenance:  Patient reported he has received updated COVID vaccine and annual flu vaccine from [facility] in September. Verified with [facility] and updated vaccine records.   Follow Up:  Telephone follow up appointment with care management team member scheduled for:  2 months; patient will send blood glucose readings by MyChart in 2 weeks.  Cherre Robins, PharmD Clinical Pharmacist [place] [phone]

## 2022-10-27 NOTE — Patient Instructions (Addendum)
home blood glucose goals  Fasting blood glucose goal (before meals) = 80 to 130 Blood glucose goal after a meal (within 2 hours of eating a meal) = less than 180   Continue to check blood glucose once a day. Make sure to get 2 or 3 readings per week that later in the day / afternoon.   Send me the readings in 2 weeks.

## 2022-10-30 DIAGNOSIS — R3914 Feeling of incomplete bladder emptying: Secondary | ICD-10-CM | POA: Diagnosis not present

## 2022-10-31 DIAGNOSIS — H40013 Open angle with borderline findings, low risk, bilateral: Secondary | ICD-10-CM | POA: Diagnosis not present

## 2022-11-01 DIAGNOSIS — H34811 Central retinal vein occlusion, right eye, with macular edema: Secondary | ICD-10-CM | POA: Diagnosis not present

## 2022-11-02 DIAGNOSIS — E1151 Type 2 diabetes mellitus with diabetic peripheral angiopathy without gangrene: Secondary | ICD-10-CM | POA: Diagnosis not present

## 2022-11-02 DIAGNOSIS — I739 Peripheral vascular disease, unspecified: Secondary | ICD-10-CM | POA: Diagnosis not present

## 2022-11-06 ENCOUNTER — Encounter: Payer: Self-pay | Admitting: Hematology & Oncology

## 2022-11-06 ENCOUNTER — Encounter: Payer: Self-pay | Admitting: Family Medicine

## 2022-11-11 ENCOUNTER — Encounter: Payer: Self-pay | Admitting: Pharmacist

## 2022-11-14 ENCOUNTER — Other Ambulatory Visit: Payer: Self-pay | Admitting: Family Medicine

## 2022-11-14 DIAGNOSIS — C9000 Multiple myeloma not having achieved remission: Secondary | ICD-10-CM

## 2022-11-21 ENCOUNTER — Encounter: Payer: Self-pay | Admitting: Pharmacist

## 2022-11-22 ENCOUNTER — Ambulatory Visit (INDEPENDENT_AMBULATORY_CARE_PROVIDER_SITE_OTHER): Payer: Medicare Other

## 2022-11-22 VITALS — Wt 206.0 lb

## 2022-11-22 DIAGNOSIS — Z0000 Encounter for general adult medical examination without abnormal findings: Secondary | ICD-10-CM | POA: Diagnosis not present

## 2022-11-22 NOTE — Progress Notes (Signed)
I connected with  Michael Rademaker Sr. on 11/22/22 by a audio enabled telemedicine application and verified that I am speaking with the correct person using two identifiers.  Patient Location: Home  Provider Location: Office/Clinic  I discussed the limitations of evaluation and management by telemedicine. The patient expressed understanding and agreed to proceed.   Subjective:   Michael Carline Sr. is a 76 y.o. male who presents for Medicare Annual/Subsequent preventive examination.  Review of Systems     Cardiac Risk Factors include: advanced age (>14mn, >>71women);dyslipidemia;hypertension;diabetes mellitus;male gender     Objective:    Today's Vitals   11/22/22 0912  Weight: 206 lb (93.4 kg)   Body mass index is 25.75 kg/m.     11/22/2022    9:17 AM 06/05/2022    2:55 PM 11/21/2021   10:27 AM 10/04/2021   10:42 AM 04/04/2021   10:34 AM 07/02/2020   10:56 AM 04/08/2020   10:02 AM  Advanced Directives  Does Patient Have a Medical Advance Directive? _0  No No  Does patient want to make changes to medical advance directive?      No - Patient declined   Would patient like information on creating a medical advance directive? No - Patient declined  No - Patient declined No - Patient declined No - Patient declined  No - Patient declined    Current Medications (verified) Outpatient Encounter Medications as of 11/22/2022  Medication Sig   albuterol (VENTOLIN HFA) 108 (90 Base) MCG/ACT inhaler TAKE 2 PUFFS BY MOUTH EVERY 6 HOURS AS NEEDED FOR WHEEZE OR SHORTNESS OF BREATH   atorvastatin (LIPITOR) 20 MG tablet TAKE 1 TABLET BY MOUTH EVERY DAY   carvedilol (COREG) 25 MG tablet TAKE 1.5 TABLETS (37.5 MG TOTAL) BY MOUTH 2 (TWO) TIMES DAILY WITH A MEAL. (Patient taking differently: Take 25 mg by mouth 2 (two) times daily with a meal.)   empagliflozin (JARDIANCE) 10 MG TABS tablet Take 1 tablet (10 mg total) by mouth daily before breakfast.   ENTRESTO 49-51 MG TAKE 1 TABLET BY MOUTH  TWICE A DAY   ferrous sulfate 325 (65 FE) MG tablet Take 325 mg by mouth every other day.   finasteride (PROSCAR) 5 MG tablet Take 5 mg by mouth daily.   FLUAD QUADRIVALENT 0.5 ML injection    Ginkgo Biloba 40 MG TABS Take 1 tablet by mouth daily.    glucosamine-chondroitin 500-400 MG tablet Take 1 tablet by mouth daily.   latanoprost (XALATAN) 0.005 % ophthalmic solution Place 1 drop into both eyes at bedtime.    metFORMIN (GLUCOPHAGE) 1000 MG tablet Take 1 tablet (1,000 mg total) by mouth 2 (two) times daily with a meal.   Multiple Vitamin (MULTIVITAMIN WITH MINERALS) TABS Take 1 tablet by mouth daily.    Omega-3 Fatty Acids (FISH OIL BURP-LESS PO) Take 1 capsule by mouth every other day.    ONETOUCH ULTRA test strip USE AS INSTRUCTED   tamsulosin (FLOMAX) 0.4 MG CAPS capsule Take 0.4 mg by mouth at bedtime.   No facility-administered encounter medications on file as of 11/22/2022.    Allergies (verified) Amlodipine and Crestor [rosuvastatin]   History: Past Medical History:  Diagnosis Date   Anemia    Cataract    Chronic systolic (congestive) heart failure (HCC)    Diabetes mellitus    GERD (gastroesophageal reflux disease)    Hyperlipidemia    Hypertension    Iron deficiency anemia due to chronic blood loss 10/02/2019   MGUS (  monoclonal gammopathy of unknown significance)    Multiple myeloma ([place]) 02/2013   normal cytogenetics and FISH panel on 03/11/2013.    Osteoarthritis    Presence of permanent cardiac pacemaker    Right bundle branch block    Syncope    Past Surgical History:  Procedure Laterality Date   BIV UPGRADE  01/04/2018   BIV UPGRADE N/A 01/04/2018   Procedure: BIVP UPGRADE;  Surgeon: [name], MD;  Location: [facility];  Service: Cardiovascular;  Laterality: N/A;   EP IMPLANTABLE DEVICE N/A 08/09/2015   Procedure: Pacemaker Implant;  Surgeon: [name], MD;  Location: [facility];  Service: Cardiovascular;  Laterality: N/A;   EYE  SURGERY     Family History  Problem Relation Age of Onset   Hypertension Mother    Cancer Mother    Stroke Mother    Hypertension Sister    Hypertension Brother    Hypertension Maternal Grandmother    Social History   Socioeconomic History   Marital status: Married    Spouse name: Not on file   Number of children: 3   Years of education: Not on file   Highest education level: Not on file  Occupational History   Not on file  Tobacco Use   Smoking status: Former    Packs/day: 2.00    Years: 35.00    Total pack years: 70.00    Types: Cigarettes    Start date: 03/26/1963    Quit date: 12/18/1997    Years since quitting: 24.9   Smokeless tobacco: Never   Tobacco comments:    quit 15 years  Vaping Use   Vaping Use: Never used  Substance and Sexual Activity   Alcohol use: No    Alcohol/week: 0.0 standard drinks of alcohol   Drug use: No   Sexual activity: Yes    Birth control/protection: None  Other Topics Concern   Not on file  Social History Narrative   Married   Building Maintenance   Social Determinants of Health   Financial Resource Strain: Low Risk  (11/22/2022)   Overall Financial Resource Strain (CARDIA)    Difficulty of Paying Living Expenses: Not hard at all  Food Insecurity: No Food Insecurity (11/22/2022)   Hunger Vital Sign    Worried About Running Out of Food in the Last Year: Never true    Ran Out of Food in the Last Year: Never true  Transportation Needs: No Transportation Needs (11/22/2022)   PRAPARE - Hydrologist (Medical): No    Lack of Transportation (Non-Medical): No  Physical Activity: Insufficiently Active (11/22/2022)   Exercise Vital Sign    Days of Exercise per Week: 5 days    Minutes of Exercise per Session: 20 min  Stress: No Stress Concern Present (11/22/2022)   [place]    Feeling of Stress : Not at all  Social Connections: Moderately  Integrated (11/22/2022)   Social Connection and Isolation Panel [NHANES]    Frequency of Communication with Friends and Family: More than three times a week    Frequency of Social Gatherings with Friends and Family: More than three times a week    Attends Religious Services: More than 4 times per year    Active Member of [employer] or Organizations: No    Attends Archivist Meetings: Never    Marital Status: Married    Tobacco Counseling Counseling given: Not  Answered Tobacco comments: quit 15 years   Clinical Intake:  Pre-visit preparation completed: Yes  Pain : No/denies pain     BMI - recorded: 25.75 Nutritional Status: BMI 25 -29 Overweight Nutritional Risks: None Diabetes: Yes CBG done?: No Did pt. bring in CBG monitor from home?: No  How often do you need to have someone help you when you read instructions, pamphlets, or other written materials from your doctor or pharmacy?: 1 - Never  Diabetic?Nutrition Risk Assessment:  Has the patient had any N/V/D within the last 2 months?  No  Does the patient have any non-healing wounds?  No  Has the patient had any unintentional weight loss or weight gain?  No   Diabetes:  Is the patient diabetic?  Yes  If diabetic, was a CBG obtained today?  No  Did the patient bring in their glucometer from home?  Yes  How often do you monitor your CBG's? As needed .   Financial Strains and Diabetes Management:  Are you having any financial strains with the device, your supplies or your medication? No .  Does the patient want to be seen by Chronic Care Management for management of their diabetes?  No  Would the patient like to be referred to a Nutritionist or for Diabetic Management?  No   Diabetic Exams:  Diabetic Eye Exam: Completed 06/12/22 Diabetic Foot Exam: Completed 07/12/22   Interpreter Needed?: No  Information entered by :: Charlott Rakes , LPN   Activities of Daily Living    11/22/2022    9:19 AM  In your  present state of health, do you have any difficulty performing the following activities:  Hearing? 1  Comment tinnitus  Vision? 0  Difficulty concentrating or making decisions? 0  Walking or climbing stairs? 0  Dressing or bathing? 0  Doing errands, shopping? 0  Preparing Food and eating ? N  Using the Toilet? N  In the past six months, have you accidently leaked urine? N  Do you have problems with loss of bowel control? N  Managing your Medications? N  Managing your Finances? N  Housekeeping or managing your Housekeeping? N    Patient Care Team: [name], [name], MD as PCP - General (Family Medicine) Edrick Oh, MD (Nephrology) Macarthur Critchley, OD as Consulting Physician (Optometry) Marin Olp, Rudell Cobb, MD as Consulting Physician (Oncology) [name], MD as Consulting Physician (Cardiology)  Indicate any recent Medical Services you may have received from other than [facility] providers in the past year (date may be approximate).     Assessment:   This is a routine wellness examination for [employer].  Hearing/Vision screen Hearing Screening - Comments:: Pt has tinnitus at times  Vision Screening - Comments:: Pt follows up with Dr Raquel James for annul eye exams   Dietary issues and exercise activities discussed: Current Exercise Habits: Home exercise routine, Type of exercise: strength training/weights;Other - see comments, Time (Minutes): 20, Frequency (Times/Week): 5, Weekly Exercise (Minutes/Week): 100   Goals Addressed             This Visit's Progress    Patient Stated       Continue exercise        Depression Screen    11/22/2022    9:16 AM 01/11/2022    2:05 PM 11/21/2021   10:33 AM 01/06/2021    9:48 AM 11/03/2019   10:15 AM 10/31/2018   10:30 AM 07/10/2018    4:37 PM  PHQ 2/9 Scores  PHQ -  2 Score 0 0 0 0 0 0 0    Fall Risk    11/22/2022    9:19 AM 01/11/2022    2:05 PM 11/21/2021   10:31 AM 01/06/2021    9:45 AM 11/03/2019   10:15 AM  Fall Risk   Falls in  the past year? 0 0 0 0 0  Number falls in past yr: 0 0 0 0 0  Injury with Fall? 0 0 0 0 0  Risk for fall due to : Impaired vision      Follow up Falls prevention discussed  Falls prevention discussed      FALL RISK PREVENTION PERTAINING TO THE HOME:  Any stairs in or around the home? Yes  If so, are there any without handrails? No  Home free of loose throw rugs in walkways, pet beds, electrical cords, etc? Yes  Adequate lighting in your home to reduce risk of falls? Yes   ASSISTIVE DEVICES UTILIZED TO PREVENT FALLS:  Life alert? No  Use of a cane, walker or w/c? No  Grab bars in the bathroom? No  Shower chair or bench in shower? No  Elevated toilet seat or a handicapped toilet? No   TIMED UP AND GO:  Was the test performed? No .   Cognitive Function:        11/22/2022    9:20 AM  6CIT Screen  What Year? 0 points  What month? 0 points  What time? 0 points  Count back from 20 0 points  Months in reverse 0 points  Repeat phrase 2 points  Total Score 2 points    Immunizations Immunization History  Administered Date(s) Administered   COVID-19, mRNA, vaccine(Comirnaty)12 years and older 09/08/2022   Fluad Quad(high Dose 65+) 08/25/2022   Influenza Split 11/04/2012   Influenza, High Dose Seasonal PF 08/24/2016, 12/13/2017, 09/24/2019   Influenza,inj,Quad PF,6+ Mos 09/21/2014, 09/16/2015, 10/02/2018   Influenza-Unspecified 10/10/2019, 10/18/2020, 09/30/2021   PFIZER Comirnaty(Gray Top)Covid-19 Tri-Sucrose Vaccine 01/19/2020, 02/02/2020, 10/18/2020, 04/06/2021   Pfizer Covid-19 Vaccine Bivalent Booster 65yr & up 10/17/2021   Pneumococcal Conjugate-13 06/01/2014, 12/29/2016   Pneumococcal Polysaccharide-23 05/06/2012   Td 10/17/2004, 07/06/2021   Tdap 11/21/2014    TDAP status: Up to date  Flu Vaccine status: Up to date  Pneumococcal vaccine status: Up to date  Covid-19 vaccine status: Completed vaccines  Qualifies for Shingles Vaccine? Yes   Zostavax  completed No   Shingrix Completed?: No.    Education has been provided regarding the importance of this vaccine. Patient has been advised to call insurance company to determine out of pocket expense if they have not yet received this vaccine. Advised may also receive vaccine at local pharmacy or Health Dept. Verbalized acceptance and understanding.  Screening Tests Health Maintenance  Topic Date Due   Zoster Vaccines- Shingrix (1 of 2) Never done   COVID-19 Vaccine (10 - 2023-24 season) 11/03/2022   HEMOGLOBIN A1C  01/12/2023   Diabetic kidney evaluation - GFR measurement  06/06/2023   OPHTHALMOLOGY EXAM  06/13/2023   Diabetic kidney evaluation - Urine ACR  06/30/2023   FOOT EXAM  07/13/2023   Medicare Annual Wellness (AWV)  11/23/2023   DTaP/Tdap/Td (4 - Td or Tdap) 07/07/2031   Pneumonia Vaccine 77 Years old  Completed   INFLUENZA VACCINE  Completed   Hepatitis C Screening  Completed   HPV VACCINES  Aged Out   COLONOSCOPY (Pts 45-484yrInsurance coverage will need to be confirmed)  Discontinued    Health Maintenance  Health Maintenance Due  Topic Date Due   Zoster Vaccines- Shingrix (1 of 2) Never done   COVID-19 Vaccine (10 - 2023-24 season) 11/03/2022    Colorectal cancer screening: Type of screening: Colonoscopy. Completed 09/07/20. Repeat every as directed  years   Additional Screening:  Hepatitis C Screening: Completed 12/08/15  Vision Screening: Recommended annual ophthalmology exams for early detection of glaucoma and other disorders of the eye. Is the patient up to date with their annual eye exam?  Yes  Who is the provider or what is the name of the office in which the patient attends annual eye exams? Dr Macarthur Critchley  If pt is not established with a provider, would they like to be referred to a provider to establish care? No .   Dental Screening: Recommended annual dental exams for proper oral hygiene  Community Resource Referral / Chronic Care Management: CRR  required this visit?  No   CCM required this visit?  No      Plan:     I have personally reviewed and noted the following in the patient's chart:   Medical and social history Use of alcohol, tobacco or illicit drugs  Current medications and supplements including opioid prescriptions. Patient is not currently taking opioid prescriptions. Functional ability and status Nutritional status Physical activity Advanced directives List of other physicians Hospitalizations, surgeries, and ER visits in previous 12 months Vitals Screenings to include cognitive, depression, and falls Referrals and appointments  In addition, I have reviewed and discussed with patient certain preventive protocols, quality metrics, and best practice recommendations. A written personalized care plan for preventive services as well as general preventive health recommendations were provided to patient.     Willette Brace, LPN   [id number]   Nurse Notes: none

## 2022-11-22 NOTE — Patient Instructions (Signed)
[name] , Thank you for taking time to come for your Medicare Wellness Visit. I appreciate your ongoing commitment to your health goals. Please review the following plan we discussed and let me know if I can assist you in the future.   These are the goals we discussed:  Goals      Patient Stated     Maintain current health     Patient Stated     Continue exercise         This is a list of the screening recommended for you and due dates:  Health Maintenance  Topic Date Due   Zoster (Shingles) Vaccine (1 of 2) Never done   COVID-19 Vaccine (10 - 2023-24 season) 11/03/2022   Hemoglobin A1C  01/12/2023   Yearly kidney function blood test for diabetes  06/06/2023   Eye exam for diabetics  06/13/2023   Yearly kidney health urinalysis for diabetes  06/30/2023   Complete foot exam   07/13/2023   Medicare Annual Wellness Visit  11/23/2023   DTaP/Tdap/Td vaccine (4 - Td or Tdap) 07/07/2031   Pneumonia Vaccine  Completed   Flu Shot  Completed   Hepatitis C Screening: USPSTF Recommendation to screen - Ages 18-79 yo.  Completed   HPV Vaccine  Aged Out   Colon Cancer Screening  Discontinued    Advanced directives: Please bring a copy of your health care power of attorney and living will to the office at your convenience.  Conditions/risks identified: continue exercise   Next appointment: Follow up in one year for your annual wellness visit.   Preventive Care 77 Years and Older, Male  Preventive care refers to lifestyle choices and visits with your health care provider that can promote health and wellness. What does preventive care include? A yearly physical exam. This is also called an annual well check. Dental exams once or twice a year. Routine eye exams. Ask your health care provider how often you should have your eyes checked. Personal lifestyle choices, including: Daily care of your teeth and gums. Regular physical activity. Eating a healthy diet. Avoiding tobacco and  drug use. Limiting alcohol use. Practicing safe sex. Taking low doses of aspirin every day. Taking vitamin and mineral supplements as recommended by your health care provider. What happens during an annual well check? The services and screenings done by your health care provider during your annual well check will depend on your age, overall health, lifestyle risk factors, and family history of disease. Counseling  Your health care provider may ask you questions about your: Alcohol use. Tobacco use. Drug use. Emotional well-being. Home and relationship well-being. Sexual activity. Eating habits. History of falls. Memory and ability to understand (cognition). Work and work Statistician. Screening  You may have the following tests or measurements: Height, weight, and BMI. Blood pressure. Lipid and cholesterol levels. These may be checked every 5 years, or more frequently if you are over 41 years old. Skin check. Lung cancer screening. You may have this screening every year starting at age 33 if you have a 30-pack-year history of smoking and currently smoke or have quit within the past 15 years. Fecal occult blood test (FOBT) of the stool. You may have this test every year starting at age 69. Flexible sigmoidoscopy or colonoscopy. You may have a sigmoidoscopy every 5 years or a colonoscopy every 10 years starting at age 35. Prostate cancer screening. Recommendations will vary depending on your family history and other risks. Hepatitis C blood test. Hepatitis  B blood test. Sexually transmitted disease (STD) testing. Diabetes screening. This is done by checking your blood sugar (glucose) after you have not eaten for a while (fasting). You may have this done every 1-3 years. Abdominal aortic aneurysm (AAA) screening. You may need this if you are a current or former smoker. Osteoporosis. You may be screened starting at age 69 if you are at high risk. Talk with your health care provider  about your test results, treatment options, and if necessary, the need for more tests. Vaccines  Your health care provider may recommend certain vaccines, such as: Influenza vaccine. This is recommended every year. Tetanus, diphtheria, and acellular pertussis (Tdap, Td) vaccine. You may need a Td booster every 10 years. Zoster vaccine. You may need this after age 104. Pneumococcal 13-valent conjugate (PCV13) vaccine. One dose is recommended after age 68. Pneumococcal polysaccharide (PPSV23) vaccine. One dose is recommended after age 52. Talk to your health care provider about which screenings and vaccines you need and how often you need them. This information is not intended to replace advice given to you by your health care provider. Make sure you discuss any questions you have with your health care provider. Document Released: 12/31/2015 Document Revised: 08/23/2016 Document Reviewed: 10/05/2015 Elsevier Interactive Patient Education  2017 [employer] Prevention in the Home Falls can cause injuries. They can happen to people of all ages. There are many things you can do to make your home safe and to help prevent falls. What can I do on the outside of my home? Regularly fix the edges of walkways and driveways and fix any cracks. Remove anything that might make you trip as you walk through a door, such as a raised step or threshold. Trim any bushes or trees on the path to your home. Use bright outdoor lighting. Clear any walking paths of anything that might make someone trip, such as rocks or tools. Regularly check to see if handrails are loose or broken. Make sure that both sides of any steps have handrails. Any raised decks and porches should have guardrails on the edges. Have any leaves, snow, or ice cleared regularly. Use sand or salt on walking paths during winter. Clean up any spills in your garage right away. This includes oil or grease spills. What can I do in the  bathroom? Use night lights. Install grab bars by the toilet and in the tub and shower. Do not use towel bars as grab bars. Use non-skid mats or decals in the tub or shower. If you need to sit down in the shower, use a plastic, non-slip stool. Keep the floor dry. Clean up any water that spills on the floor as soon as it happens. Remove soap buildup in the tub or shower regularly. Attach bath mats securely with double-sided non-slip rug tape. Do not have throw rugs and other things on the floor that can make you trip. What can I do in the bedroom? Use night lights. Make sure that you have a light by your bed that is easy to reach. Do not use any sheets or blankets that are too big for your bed. They should not hang down onto the floor. Have a firm chair that has side arms. You can use this for support while you get dressed. Do not have throw rugs and other things on the floor that can make you trip. What can I do in the kitchen? Clean up any spills right away. Avoid walking on wet floors. Keep  items that you use a lot in easy-to-reach places. If you need to reach something above you, use a strong step stool that has a grab bar. Keep electrical cords out of the way. Do not use floor polish or wax that makes floors slippery. If you must use wax, use non-skid floor wax. Do not have throw rugs and other things on the floor that can make you trip. What can I do with my stairs? Do not leave any items on the stairs. Make sure that there are handrails on both sides of the stairs and use them. Fix handrails that are broken or loose. Make sure that handrails are as long as the stairways. Check any carpeting to make sure that it is firmly attached to the stairs. Fix any carpet that is loose or worn. Avoid having throw rugs at the top or bottom of the stairs. If you do have throw rugs, attach them to the floor with carpet tape. Make sure that you have a light switch at the top of the stairs and the  bottom of the stairs. If you do not have them, ask someone to add them for you. What else can I do to help prevent falls? Wear shoes that: Do not have high heels. Have rubber bottoms. Are comfortable and fit you well. Are closed at the toe. Do not wear sandals. If you use a stepladder: Make sure that it is fully opened. Do not climb a closed stepladder. Make sure that both sides of the stepladder are locked into place. Ask someone to hold it for you, if possible. Clearly [place] and make sure that you can see: Any grab bars or handrails. First and last steps. Where the edge of each step is. Use tools that help you move around (mobility aids) if they are needed. These include: Canes. Walkers. Scooters. Crutches. Turn on the lights when you go into a dark area. Replace any light bulbs as soon as they burn out. Set up your furniture so you have a clear path. Avoid moving your furniture around. If any of your floors are uneven, fix them. If there are any pets around you, be aware of where they are. Review your medicines with your doctor. Some medicines can make you feel dizzy. This can increase your chance of falling. Ask your doctor what other things that you can do to help prevent falls. This information is not intended to replace advice given to you by your health care provider. Make sure you discuss any questions you have with your health care provider. Document Released: 09/30/2009 Document Revised: 05/11/2016 Document Reviewed: 01/08/2015 Elsevier Interactive Patient Education  2017 [employer].

## 2022-11-24 ENCOUNTER — Encounter: Payer: Self-pay | Admitting: Pharmacist

## 2022-12-05 ENCOUNTER — Encounter: Payer: Self-pay | Admitting: Pharmacist

## 2022-12-06 ENCOUNTER — Encounter: Payer: Self-pay | Admitting: Pharmacist

## 2022-12-17 NOTE — Progress Notes (Unsigned)
[place] at [facility] [address], [place], [place] [address] [phone]  Date:  12/20/2022   Name:  Michael Wiggins.   DOB:  [birth year]   MRN:  [id number]  PCP:  Darreld Mclean, MD    Chief Complaint: No chief complaint on file.   History of Present Illness:  Michael Holcomb Wiggins. is a 76 y.o. very pleasant male patient who presents with the following:  Patient seen today for follow-up-17-monthrecheck Most recent visit with myself was in July  History of diabetes, proteinuria, chronic kidney disease, CHF, hypertension, Mobitz 2 heart block with pacemaker, hyperlipidemia, MGUS/multiple myeloma and iron deficiency anemia   He tends to be quite physically active, he works physically and also walks on his treadmill  Our Pharm.D. Tammy has been helping him with diabetes management  Can do a urine microalbumin Recommend COVID-19 vaccine booster Shingrix  He was seen by urology in August Most recent oncology checkup was in June Lab Results  Component Value Date   HGBA1C 7.8 (H) 07/12/2022    Patient Active Problem List   Diagnosis Date Noted   Chronic kidney disease (CKD), active medical management without dialysis 10/09/2019   Iron deficiency anemia due to chronic blood loss 100/93/8182  Chronic systolic (congestive) heart failure ([place] 01/04/2018   Pacemaker 12/08/2015   Mobitz type 2 second degree heart block 08/08/2015   Right bundle branch block 12/16/2014   Syncope 12/16/2014   Proteinuria due to type 2 diabetes mellitus ([place] 11/12/2014   Encounter for therapeutic drug monitoring 03/13/2013   Multiple myeloma ([place] 02/15/2013   Edema 01/07/2013   Diabetes mellitus due to underlying condition with other diabetic kidney complication (HCC)    Hyperlipidemia    Hypertension    MGUS (monoclonal gammopathy of unknown significance)    GERD (gastroesophageal reflux disease)     Past Medical History:  Diagnosis  Date   Anemia    Cataract    Chronic systolic (congestive) heart failure (HCC)    Diabetes mellitus    GERD (gastroesophageal reflux disease)    Hyperlipidemia    Hypertension    Iron deficiency anemia due to chronic blood loss 10/02/2019   MGUS (monoclonal gammopathy of unknown significance)    Multiple myeloma ([place] 02/2013   normal cytogenetics and FISH panel on 03/11/2013.    Osteoarthritis    Presence of permanent cardiac pacemaker    Right bundle branch block    Syncope     Past Surgical History:  Procedure Laterality Date   BIV UPGRADE  01/04/2018   BIV UPGRADE N/A 01/04/2018   Procedure: BIVP UPGRADE;  Surgeon: TEvans Lance MD;  [facility];  Service: Cardiovascular;  Laterality: N/A;   EP IMPLANTABLE DEVICE N/A 08/09/2015   Procedure: Pacemaker Implant;  Surgeon: GEvans Lance MD;  [facility];  Service: Cardiovascular;  Laterality: N/A;   EYE SURGERY      Social History   Tobacco Use   Smoking status: Former    Packs/day: 2.00    Years: 35.00    Total pack years: 70.00    Types: Cigarettes    Start date: 03/26/1963    Quit date: 12/18/1997    Years since quitting: 25.0   Smokeless tobacco: Never   Tobacco comments:    quit 15 years  Vaping Use   Vaping Use: Never used  Substance Use Topics   Alcohol use: No  Alcohol/week: 0.0 standard drinks of alcohol   Drug use: No    Family History  Problem Relation Age of Onset   Hypertension Mother    Cancer Mother    Stroke Mother    Hypertension Sister    Hypertension Brother    Hypertension Maternal Grandmother     Allergies  Allergen Reactions   Amlodipine Swelling   Crestor [Rosuvastatin] Other (See Comments)    Per pt unable to focus    Medication list has been reviewed and updated.  Current Outpatient Medications on File Prior to Visit  Medication Sig Dispense Refill   albuterol (VENTOLIN HFA) 108 (90 Base) MCG/ACT inhaler TAKE 2 PUFFS BY MOUTH EVERY 6 HOURS  AS NEEDED FOR WHEEZE OR SHORTNESS OF BREATH 8.5 each 1   atorvastatin (LIPITOR) 20 MG tablet TAKE 1 TABLET BY MOUTH EVERY DAY 90 tablet 3   carvedilol (COREG) 25 MG tablet TAKE 1.5 TABLETS (37.5 MG TOTAL) BY MOUTH 2 (TWO) TIMES DAILY WITH A MEAL. (Patient taking differently: Take 25 mg by mouth 2 (two) times daily with a meal.) 270 tablet 3   empagliflozin (JARDIANCE) 10 MG TABS tablet Take 1 tablet (10 mg total) by mouth daily before breakfast. 90 tablet 3   ENTRESTO 49-51 MG TAKE 1 TABLET BY MOUTH TWICE A DAY 180 tablet 3   ferrous sulfate 325 (65 FE) MG tablet Take 325 mg by mouth every other day.     finasteride (PROSCAR) 5 MG tablet Take 5 mg by mouth daily.     FLUAD QUADRIVALENT 0.5 ML injection      Ginkgo Biloba 40 MG TABS Take 1 tablet by mouth daily.      glucosamine-chondroitin 500-400 MG tablet Take 1 tablet by mouth daily.     latanoprost (XALATAN) 0.005 % ophthalmic solution Place 1 drop into both eyes at bedtime.      metFORMIN (GLUCOPHAGE) 1000 MG tablet Take 1 tablet (1,000 mg total) by mouth 2 (two) times daily with a meal. 180 tablet 0   Multiple Vitamin (MULTIVITAMIN WITH MINERALS) TABS Take 1 tablet by mouth daily.      Omega-3 Fatty Acids (FISH OIL BURP-LESS PO) Take 1 capsule by mouth every other day.      ONETOUCH ULTRA test strip USE AS INSTRUCTED 100 strip 12   tamsulosin (FLOMAX) 0.4 MG CAPS capsule Take 0.4 mg by mouth at bedtime.     No current facility-administered medications on file prior to visit.    Review of Systems:  As per HPI- otherwise negative.   Physical Examination: There were no vitals filed for this visit. There were no vitals filed for this visit. There is no height or weight on file to calculate BMI. Ideal Body Weight:    GEN: no acute distress. HEENT: Atraumatic, Normocephalic.  Ears and Nose: No external deformity. CV: RRR, No M/G/R. No JVD. No thrill. No extra heart sounds. PULM: CTA B, no wheezes, crackles, rhonchi. No retractions.  No resp. distress. No accessory muscle use. ABD: S, NT, ND, +BS. No rebound. No HSM. EXTR: No c/c/e PSYCH: Normally interactive. Conversant.    Assessment and Plan: ***  Signed Lamar Blinks, MD

## 2022-12-17 NOTE — Patient Instructions (Incomplete)
It was good to see you again today Recommend the latest COVID-19 booster, RSV, Shingrix if not done already I will be in touch with your labs.  Assuming all is well lets check back in about 6 months We can go ahead and increase your Jardiance to 25 mg Please turn in your paperwork to get diabetic shoes

## 2022-12-18 ENCOUNTER — Other Ambulatory Visit: Payer: Self-pay | Admitting: Internal Medicine

## 2022-12-18 ENCOUNTER — Other Ambulatory Visit: Payer: Self-pay | Admitting: Family Medicine

## 2022-12-18 DIAGNOSIS — I10 Essential (primary) hypertension: Secondary | ICD-10-CM

## 2022-12-20 ENCOUNTER — Ambulatory Visit (INDEPENDENT_AMBULATORY_CARE_PROVIDER_SITE_OTHER): Payer: Medicare Other | Admitting: Family Medicine

## 2022-12-20 ENCOUNTER — Other Ambulatory Visit: Payer: Self-pay | Admitting: Family Medicine

## 2022-12-20 ENCOUNTER — Encounter: Payer: Self-pay | Admitting: Family Medicine

## 2022-12-20 VITALS — BP 145/80 | HR 65 | Temp 97.8°F | Resp 18 | Ht 75.0 in | Wt 207.8 lb

## 2022-12-20 DIAGNOSIS — D472 Monoclonal gammopathy: Secondary | ICD-10-CM | POA: Diagnosis not present

## 2022-12-20 DIAGNOSIS — N189 Chronic kidney disease, unspecified: Secondary | ICD-10-CM

## 2022-12-20 DIAGNOSIS — C9000 Multiple myeloma not having achieved remission: Secondary | ICD-10-CM | POA: Diagnosis not present

## 2022-12-20 DIAGNOSIS — E785 Hyperlipidemia, unspecified: Secondary | ICD-10-CM

## 2022-12-20 DIAGNOSIS — E0829 Diabetes mellitus due to underlying condition with other diabetic kidney complication: Secondary | ICD-10-CM

## 2022-12-20 DIAGNOSIS — I10 Essential (primary) hypertension: Secondary | ICD-10-CM | POA: Diagnosis not present

## 2022-12-20 LAB — COMPREHENSIVE METABOLIC PANEL
ALT: 21 U/L (ref 0–53)
AST: 18 U/L (ref 0–37)
Albumin: 4 g/dL (ref 3.5–5.2)
Alkaline Phosphatase: 52 U/L (ref 39–117)
BUN: 28 mg/dL — ABNORMAL HIGH (ref 6–23)
CO2: 26 mEq/L (ref 19–32)
Calcium: 9.6 mg/dL (ref 8.4–10.5)
Chloride: 100 mEq/L (ref 96–112)
Creatinine, Ser: 1.59 mg/dL — ABNORMAL HIGH (ref 0.40–1.50)
GFR: 41.72 mL/min — ABNORMAL LOW (ref >60.00)
Glucose, Bld: 144 mg/dL — ABNORMAL HIGH (ref 70–99)
Potassium: 4.3 mEq/L (ref 3.5–5.1)
Sodium: 134 mEq/L — ABNORMAL LOW (ref 135–145)
Total Bilirubin: 0.4 mg/dL (ref 0.2–1.2)
Total Protein: 7.8 g/dL (ref 6.0–8.3)

## 2022-12-20 LAB — LIPID PANEL
Cholesterol: 129 mg/dL (ref 0–200)
HDL: 33.4 mg/dL — ABNORMAL LOW (ref >39.00)
LDL Cholesterol: 81 mg/dL (ref 0–99)
NonHDL: 95.12
Total CHOL/HDL Ratio: 4
Triglycerides: 71 mg/dL (ref 0.0–149.0)
VLDL: 14.2 mg/dL (ref 0.0–40.0)

## 2022-12-20 LAB — MICROALBUMIN / CREATININE URINE RATIO
Creatinine,U: 30 mg/dL
Microalb Creat Ratio: 27.9 mg/g (ref 0.0–30.0)
Microalb, Ur: 8.4 mg/dL — ABNORMAL HIGH (ref 0.0–1.9)

## 2022-12-20 LAB — CBC
HCT: 40.2 % (ref 39.0–52.0)
Hemoglobin: 13.2 g/dL (ref 13.0–17.0)
MCHC: 32.8 g/dL (ref 30.0–36.0)
MCV: 90.1 fl (ref 78.0–100.0)
Platelets: 192 10*3/uL (ref 150.0–400.0)
RBC: 4.46 Mil/uL (ref 4.22–5.81)
RDW: 14.4 % (ref 11.5–15.5)
WBC: 6.3 10*3/uL (ref 4.0–10.5)

## 2022-12-20 LAB — HEMOGLOBIN A1C: Hgb A1c MFr Bld: 8.5 % — ABNORMAL HIGH (ref 4.6–6.5)

## 2022-12-20 MED ORDER — EMPAGLIFLOZIN 25 MG PO TABS: 25.0000 mg | ORAL_TABLET | Freq: Every day | ORAL | 3 refills | Status: DC

## 2023-01-01 DIAGNOSIS — N1831 Chronic kidney disease, stage 3a: Secondary | ICD-10-CM | POA: Diagnosis not present

## 2023-01-04 ENCOUNTER — Ambulatory Visit: Payer: Medicare Other | Attending: Internal Medicine

## 2023-01-04 DIAGNOSIS — I441 Atrioventricular block, second degree: Secondary | ICD-10-CM | POA: Diagnosis not present

## 2023-01-04 LAB — CUP PACEART REMOTE DEVICE CHECK
Battery Remaining Longevity: 35 mo
Battery Remaining Percentage: 39 %
Battery Voltage: 2.96 V
Brady Statistic AP VP Percent: 21 %
Brady Statistic AP VS Percent: 1 %
Brady Statistic AS VP Percent: 78 %
Brady Statistic AS VS Percent: 1 %
Brady Statistic RA Percent Paced: 20 %
Date Time Interrogation Session: 20240118040014
Implantable Lead Connection Status: 753985
Implantable Lead Connection Status: 753985
Implantable Lead Connection Status: 753985
Implantable Lead Implant Date: 20160822
Implantable Lead Implant Date: 20160822
Implantable Lead Implant Date: 20190118
Implantable Lead Location: 753858
Implantable Lead Location: 753859
Implantable Lead Location: 753860
Implantable Pulse Generator Implant Date: 20190118
Lead Channel Impedance Value: 1175 Ohm
Lead Channel Impedance Value: 400 Ohm
Lead Channel Impedance Value: 560 Ohm
Lead Channel Pacing Threshold Amplitude: 0.625 V
Lead Channel Pacing Threshold Amplitude: 1.125 V
Lead Channel Pacing Threshold Amplitude: 1.875 V
Lead Channel Pacing Threshold Pulse Width: 0.5 ms
Lead Channel Pacing Threshold Pulse Width: 0.5 ms
Lead Channel Pacing Threshold Pulse Width: 0.5 ms
Lead Channel Sensing Intrinsic Amplitude: 12 mV
Lead Channel Sensing Intrinsic Amplitude: 4.4 mV
Lead Channel Setting Pacing Amplitude: 1.625
Lead Channel Setting Pacing Amplitude: 2.125
Lead Channel Setting Pacing Amplitude: 2.875
Lead Channel Setting Pacing Pulse Width: 0.5 ms
Lead Channel Setting Pacing Pulse Width: 0.5 ms
Lead Channel Setting Sensing Sensitivity: 4 mV
Pulse Gen Model: 3262
Pulse Gen Serial Number: 8983138

## 2023-01-08 DIAGNOSIS — E1122 Type 2 diabetes mellitus with diabetic chronic kidney disease: Secondary | ICD-10-CM | POA: Diagnosis not present

## 2023-01-08 DIAGNOSIS — N1832 Chronic kidney disease, stage 3b: Secondary | ICD-10-CM | POA: Diagnosis not present

## 2023-01-08 DIAGNOSIS — C9000 Multiple myeloma not having achieved remission: Secondary | ICD-10-CM | POA: Diagnosis not present

## 2023-01-08 DIAGNOSIS — I5022 Chronic systolic (congestive) heart failure: Secondary | ICD-10-CM | POA: Diagnosis not present

## 2023-01-08 DIAGNOSIS — I129 Hypertensive chronic kidney disease with stage 1 through stage 4 chronic kidney disease, or unspecified chronic kidney disease: Secondary | ICD-10-CM | POA: Diagnosis not present

## 2023-01-11 ENCOUNTER — Other Ambulatory Visit: Payer: Self-pay | Admitting: Internal Medicine

## 2023-01-16 ENCOUNTER — Encounter: Payer: Medicare Other | Admitting: Pharmacist

## 2023-01-17 ENCOUNTER — Ambulatory Visit (INDEPENDENT_AMBULATORY_CARE_PROVIDER_SITE_OTHER): Payer: Medicare Other | Admitting: Pharmacist

## 2023-01-17 DIAGNOSIS — E119 Type 2 diabetes mellitus without complications: Secondary | ICD-10-CM | POA: Diagnosis not present

## 2023-01-17 DIAGNOSIS — E0829 Diabetes mellitus due to underlying condition with other diabetic kidney complication: Secondary | ICD-10-CM

## 2023-01-17 NOTE — Progress Notes (Signed)
This encounter was created in error - please disregard.

## 2023-01-17 NOTE — Progress Notes (Signed)
Pharmacy Note  01/17/2023 Name: Michael Epps Sr. MRN: [id number] DOB: 13-Sep-1946  Subjective: Michael Preziosi Sr. is a 77 y.o. year old male who is a primary care patient of [name], [name], MD. Clinical Pharmacist Practitioner referral was placed to assist with medication, CHF and diabetes management.  Patient is in today to have a sample Continuous Glucose Monitor place so we can get a better idea of blood glucose trends as his reported home blood glucose readings do no match up with last A1c.   Engaged with patient by telephone for follow up visit today.  Type 2 DM -  Current therapy - Jardiance '25mg'$  daily and metformin '1000mg'$  twice a day.  Past Medications tried: glipizide stopped due to low blood glucose in the afternoon.  Patient's home blood glucose over the last 2 months has been 101 to 150's mostly however his last A1c was 8.5% on 12/20/2022 - which corresponds to average blood glucose of 197. Dose of Jardiance was increased to '25mg'$  daily after last A1c. Patient states he feels that blood glucose has actually been higher since he started higher dose of Jardiance.   Objective: Review of patient status, including review of consultants reports, laboratory and other test data, was performed as part of comprehensive evaluation and provision of chronic care management services.   Lab Results  Component Value Date   CREATININE 1.59 (H) 12/20/2022   CREATININE 1.38 (H) 06/05/2022   CREATININE 1.21 10/04/2021    Lab Results  Component Value Date   HGBA1C 8.5 (H) 12/20/2022       Component Value Date/Time   CHOL 129 12/20/2022 0949   CHOL 151 12/26/2017 0847   TRIG 71.0 12/20/2022 0949   HDL 33.40 (L) 12/20/2022 0949   HDL 34 (L) 12/26/2017 0847   CHOLHDL 4 12/20/2022 0949   VLDL 14.2 12/20/2022 0949   LDLCALC 81 12/20/2022 0949   LDLCALC 104 (H) 12/26/2017 0847     Clinical ASCVD: Yes  The ASCVD Risk score (Arnett DK, et al., 2019) failed to calculate for the  following reasons:   The valid total cholesterol range is 130 to 320 mg/dL    BP Readings from Last 3 Encounters:  12/20/22 (!) 145/80  07/12/22 132/80  06/05/22 (!) 161/93     Allergies  Allergen Reactions   Amlodipine Swelling   Crestor [Rosuvastatin] Other (See Comments)    Per pt unable to focus    Medications Reviewed Today     Reviewed by Cherre Robins, RPH-CPP (Pharmacist) on 01/17/23 at [place] List Status: <None>   Medication Order Taking? Sig Documenting Provider Last Dose Status Informant  albuterol (VENTOLIN HFA) 108 (90 Base) MCG/ACT inhaler [id number] No TAKE 2 PUFFS BY MOUTH EVERY 6 HOURS AS NEEDED FOR WHEEZE OR SHORTNESS OF BREATH [name], [name], MD Taking Active   atorvastatin (LIPITOR) 20 MG tablet [id number] No TAKE 1 TABLET BY MOUTH EVERY DAY [name], MD Taking Active   carvedilol (COREG) 25 MG tablet [id number]  TAKE 1.5 TABLETS (37.5 MG TOTAL) BY MOUTH 2 (TWO) TIMES DAILY WITH A MEAL. [name], MD  Active   empagliflozin (JARDIANCE) 25 MG TABS tablet [id number]  Take 1 tablet (25 mg total) by mouth daily before breakfast. [name], [name], MD  Active   ENTRESTO 49-51 MG [id number] No TAKE 1 TABLET BY MOUTH TWICE A DAY [name], [name], MD Taking Active   ferrous sulfate 325 (65 FE) MG tablet [id number] No Take 325 mg  by mouth every other day. [provider] Taking Active Self  finasteride (PROSCAR) 5 MG tablet [id number] No Take 5 mg by mouth daily. [provider] Taking Active Self  FLUAD QUADRIVALENT 0.5 ML injection [id number] No  [provider] Taking Active   Ginkgo Biloba 40 MG TABS [id number] No Take 1 tablet by mouth daily.  [provider] Taking Active Self  glucosamine-chondroitin 500-400 MG tablet [id number] No Take 1 tablet by mouth daily. [provider] Taking Active   latanoprost (XALATAN) 0.005 % ophthalmic solution [id number] No Place 1 drop into both eyes at bedtime.  [provider] Taking Active Self  metFORMIN (GLUCOPHAGE) 1000 MG tablet [id number]  TAKE 1 TABLET (1,000 MG TOTAL) BY MOUTH TWICE A DAY WITH FOOD [name], [name], MD  Active   Multiple Vitamin (MULTIVITAMIN WITH MINERALS) TABS [id number] No Take 1 tablet by mouth daily.  [provider] Taking Active Self  Omega-3 Fatty Acids (FISH OIL BURP-LESS PO) [id number] No Take 1 capsule by mouth every other day.  [provider] Taking Active Self  Donald Siva test strip [id number] No USE AS INSTRUCTED [name], [name], MD Taking Active   tamsulosin (FLOMAX) 0.4 MG CAPS capsule [id number] No Take 0.4 mg by mouth at bedtime. [provider] Taking Active             Patient Active Problem List   Diagnosis Date Noted   Chronic kidney disease (CKD), active medical management without dialysis 10/09/2019   Iron deficiency anemia due to chronic blood loss 09/47/0962   Chronic systolic (congestive) heart failure ([place]) 01/04/2018   Pacemaker 12/08/2015   Mobitz type 2 second degree heart block 08/08/2015   Right bundle branch block 12/16/2014   Syncope 12/16/2014   Proteinuria due to type 2 diabetes mellitus ([place]) 11/12/2014   Encounter for therapeutic drug monitoring 03/13/2013   Multiple myeloma ([place]) 02/15/2013   Edema 01/07/2013   Diabetes mellitus due to underlying condition with other diabetic kidney complication (HCC)    Hyperlipidemia    Hypertension    MGUS (monoclonal gammopathy of unknown significance)    GERD (gastroesophageal reflux disease)      Medication Assistance:   Approved for cardiomyopathy funds thru [employer] - available thru 06/18/2023   Assessment / Plan: Type 2 DM - Last A1c not at goal of < 7.0% Continuous Glucose monitor was placed today to try to get better understanding of blood glucose trends. Monitor was place on back of left arms. Patient will wear for 10 days and then change.  Patient received the following  instruction for DexCom 7 Continuous Glucose Monitor :   - preparation of placement site - clean with alcohol and allow to dry.  Sensor is to   only be place on back of upper arm.  Patient to rotate sides and site.   -prep of sensor and applicator.   -care of sensor and site   - reminded that sensor is waterproof   -taught how to scan sensor and evaluted BG reading on his phone  - reviewed reader screen, what arrows indicate  - reminded that if no number or no arrow he should check blood glucose with a fingerstick check.        Follow Up:  Telephone follow up appointment with care management team member scheduled for:  5 to 7 days     Cherre Robins, PharmD Clinical Pharmacist [place] [phone]

## 2023-01-18 ENCOUNTER — Telehealth: Payer: Self-pay

## 2023-01-18 NOTE — Telephone Encounter (Signed)
Patient called and requesting a call back from Tammy E.  Please call at your earliest convenience.

## 2023-01-18 NOTE — Telephone Encounter (Signed)
Patient states that his phone and the DexCom sensor we started yesterday have stopped communicating. He is getting a lost signal alert since he awoke this morning.  Patient will stop by office today so that I can look and the sensor and his phone and if needed replace sensor.

## 2023-01-22 ENCOUNTER — Ambulatory Visit (INDEPENDENT_AMBULATORY_CARE_PROVIDER_SITE_OTHER): Payer: Medicare Other | Admitting: Pharmacist

## 2023-01-22 DIAGNOSIS — N189 Chronic kidney disease, unspecified: Secondary | ICD-10-CM

## 2023-01-22 DIAGNOSIS — E1122 Type 2 diabetes mellitus with diabetic chronic kidney disease: Secondary | ICD-10-CM

## 2023-01-22 DIAGNOSIS — I10 Essential (primary) hypertension: Secondary | ICD-10-CM

## 2023-01-22 NOTE — Progress Notes (Signed)
Remote pacemaker transmission.   

## 2023-01-22 NOTE — Progress Notes (Signed)
Pharmacy Note  01/22/2023 Name: Michael Gronau Sr. MRN: [id number] DOB: 07-17-1946  Subjective: Michael Duce Sr. is a 77 y.o. year old male who is a primary care patient of Michael Wiggins. Clinical Pharmacist Practitioner referral was placed to assist with medication, CHF and diabetes management.     Engaged with patient by telephone for follow up visit today.  Type 2 DM -  Current therapy - Jardiance '25mg'$  daily and metformin '1000mg'$  once a day with evening meal Past Medications tried: glipizide stopped due to low blood glucose in the afternoon.  Patient's home blood glucose over the last 2 months has been 101 to 150's mostly however his last A1c was 8.5% on 12/20/2022 - which corresponds to average blood glucose of 197. Dose of Jardiance was increased to '25mg'$  daily after last A1c. Placed a Continuous Glucose Monitor DexCom sensor last week.   Patient reports blood glucose has been in the "[place] area" most of the time over the last 3 days except he had a reading of 275 around 1pm prior to lunch on Saturday 01/20/2023. He took a glipizide '10mg'$  tablet and blood glucose 3 hours later was 121.  This morning blood glucose was 171.  Was finally able to connect with a patient on the [facility] Clarity app.      Hypertension:  Patient reports that home blood pressure has been better. SBP 130 to 142 and DBP in the 70's.  Current medication: carvedilol '25mg'$  twice a day, Entresto 49/'51mg'$  twice a day.   CKD 3b - Last visit with nephrology / Michael Wiggins was 01/08/2023  Objective: Review of patient status, including review of consultants reports, laboratory and other test data, was performed as part of comprehensive evaluation and provision of chronic care management services.   Lab Results  Component Value Date   CREATININE 1.59 (H) 12/20/2022   CREATININE 1.38 (H) 06/05/2022   CREATININE 1.21 10/04/2021    Lab Results  Component Value Date   HGBA1C 8.5 (H) 12/20/2022        Component Value Date/Time   CHOL 129 12/20/2022 0949   CHOL 151 12/26/2017 0847   TRIG 71.0 12/20/2022 0949   HDL 33.40 (L) 12/20/2022 0949   HDL 34 (L) 12/26/2017 0847   CHOLHDL 4 12/20/2022 0949   VLDL 14.2 12/20/2022 0949   LDLCALC 81 12/20/2022 0949   LDLCALC 104 (H) 12/26/2017 0847     Clinical ASCVD: Yes  The ASCVD Risk score (Arnett DK, et al., 2019) failed to calculate for the following reasons:   The valid total cholesterol range is 130 to 320 mg/dL    BP Readings from Last 3 Encounters:  12/20/22 (!) 145/80  07/12/22 132/80  06/05/22 (!) 161/93     Allergies  Allergen Reactions   Amlodipine Swelling   Crestor [Rosuvastatin] Other (See Comments)    Per pt unable to focus    Medications Reviewed Today     Reviewed by Michael Wiggins, RPH-CPP (Pharmacist) on 01/22/23 at [place] List Status: <None>   Medication Order Taking? Sig Documenting Provider Last Dose Status Informant  albuterol (VENTOLIN HFA) 108 (90 Base) MCG/ACT inhaler [id number] No TAKE 2 PUFFS BY MOUTH EVERY 6 HOURS AS NEEDED FOR WHEEZE OR SHORTNESS OF BREATH Michael Wiggins Taking Active   atorvastatin (LIPITOR) 20 MG tablet [id number] No TAKE 1 TABLET BY MOUTH EVERY DAY Michael Lance, Wiggins Taking Active   carvedilol (COREG) 25 MG tablet [id number]  TAKE 1.5 TABLETS (37.5  MG TOTAL) BY MOUTH 2 (TWO) TIMES DAILY WITH A MEAL. Michael Lance, Wiggins  Active   empagliflozin (JARDIANCE) 25 MG TABS tablet [id number]  Take 1 tablet (25 mg total) by mouth daily before breakfast. Michael Wiggins  Active   ENTRESTO 49-51 MG [id number] No TAKE 1 TABLET BY MOUTH TWICE A DAY Michael Wiggins Taking Active   ferrous sulfate 325 (65 FE) MG tablet [id number] No Take 325 mg by mouth every other day. Provider, Historical, Wiggins Taking Active Self  finasteride (PROSCAR) 5 MG tablet [id number] No Take 5 mg by mouth daily. Provider, Historical, Wiggins Taking Active Self  FLUAD QUADRIVALENT 0.5 ML injection [id number] No   Provider, Historical, Wiggins Taking Active   Ginkgo Biloba 40 MG TABS [id number] No Take 1 tablet by mouth daily.  Provider, Historical, Wiggins Taking Active Self  glucosamine-chondroitin 500-400 MG tablet [id number] No Take 1 tablet by mouth daily. Provider, Historical, Wiggins Taking Active   latanoprost (XALATAN) 0.005 % ophthalmic solution [id number] No Place 1 drop into both eyes at bedtime.  Provider, Historical, Wiggins Taking Active Self  metFORMIN (GLUCOPHAGE) 1000 MG tablet [id number]  TAKE 1 TABLET (1,000 MG TOTAL) BY MOUTH TWICE A DAY WITH FOOD  Patient taking differently: Take 1,000 mg by mouth daily with supper.   Michael Wiggins  Active   Multiple Vitamin (MULTIVITAMIN WITH MINERALS) TABS [id number] No Take 1 tablet by mouth daily.  Provider, Historical, Wiggins Taking Active Self  Omega-3 Fatty Acids (FISH OIL BURP-LESS PO) [id number] No Take 1 capsule by mouth every other day.  Provider, Historical, Wiggins Taking Active Self  Donald Siva test strip [id number] No USE AS INSTRUCTED Michael Wiggins Taking Active   tamsulosin (FLOMAX) 0.4 MG CAPS capsule [id number] No Take 0.4 mg by mouth at bedtime. Provider, Historical, Wiggins Taking Active             Patient Active Problem List   Diagnosis Date Noted   Chronic Wiggins disease (CKD), active medical management without dialysis 10/09/2019   Iron deficiency anemia due to chronic blood loss 56/81/2751   Chronic systolic (congestive) heart failure ([place]) 01/04/2018   Pacemaker 12/08/2015   Mobitz type 2 second degree heart block 08/08/2015   Right bundle branch block 12/16/2014   Syncope 12/16/2014   Proteinuria due to type 2 diabetes mellitus ([place]) 11/12/2014   Encounter for therapeutic drug monitoring 03/13/2013   Multiple myeloma ([place]) 02/15/2013   Edema 01/07/2013   Diabetes mellitus due to underlying condition with other diabetic Wiggins complication (HCC)    Hyperlipidemia    Hypertension    MGUS (monoclonal gammopathy of unknown  significance)    GERD (gastroesophageal reflux disease)      Medication Assistance:   Approved for cardiomyopathy funds thru [employer] - available thru 06/18/2023   Assessment / Plan: Type 2 DM - Last A1c not at goal of < 7.0% Continue to wear DexCom Continuous Glucose Monitor for 20 days total.  Assisted patient over the phone with connecting with practice in the [facility] app so that we could see blood glucose trends.  Continue current regimen for now - will recheck blood glucose reports and adjust meds later this week if needd.     Hypertension - blood pressure goal is < 130 /80 due to CKD and DM Patient's blood pressure improved. Continue Entresto and carvedilol.  Continue to check blood pressure at home.   CKD 3b -  Recommended continue Jardiance as it as  benefits for DM, CKD and CHF.  Continue to follow up with nephrology   Follow Up:  Telephone follow up appointment with care management team member scheduled for:  5 to 7 days     Michael Wiggins, PharmD Clinical Pharmacist [place] [phone]

## 2023-01-25 ENCOUNTER — Other Ambulatory Visit: Payer: Self-pay | Admitting: Internal Medicine

## 2023-01-27 ENCOUNTER — Encounter: Payer: Self-pay | Admitting: Pharmacist

## 2023-02-01 ENCOUNTER — Ambulatory Visit: Payer: Medicare Other | Attending: Internal Medicine | Admitting: Internal Medicine

## 2023-02-01 ENCOUNTER — Encounter: Payer: Self-pay | Admitting: Internal Medicine

## 2023-02-01 VITALS — BP 112/72 | HR 63 | Ht 70.0 in | Wt 206.0 lb

## 2023-02-01 DIAGNOSIS — I5022 Chronic systolic (congestive) heart failure: Secondary | ICD-10-CM | POA: Diagnosis not present

## 2023-02-01 NOTE — Patient Instructions (Signed)
Medication Instructions:  Your physician recommends that you continue on your current medications as directed. Please refer to the Current Medication list given to you today.  *If you need a refill on your cardiac medications before your next appointment, please call your pharmacy*   Lab Work: None ordered   Testing/Procedures: None ordered   Follow-Up: At [facility], you and your health needs are our priority.  As part of our continuing mission to provide you with exceptional heart care, we have created designated Provider Care Teams.  These Care Teams include your primary Cardiologist (physician) and Advanced Practice Providers (APPs -  Physician Assistants and Nurse Practitioners) who all work together to provide you with the care you need, when you need it.  Remote monitoring is used to monitor your Pacemaker or ICD from home. This monitoring reduces the number of office visits required to check your device to one time per year. It allows [place] to keep an eye on the functioning of your device to ensure it is working properly. You are scheduled for a device check from home on 04/05/23. You may send your transmission at any time that day. If you have a wireless device, the transmission will be sent automatically. After your physician reviews your transmission, you will receive a postcard with your next transmission date.  Your next appointment:   1 year(s)  The format for your next appointment:   In Person  Provider:   Cristopher Peru, MD  Thank you for choosing [facility]!!   Trinidad Curet, RN [phone]

## 2023-02-01 NOTE — Progress Notes (Signed)
HPI Mr. Rosanna Randy returns today for ongoing evaluation of his PPM after undergoing Biv upgrade just over 5 years ago. In the interim, he has done well. He is back to working regularly. He works in Engineer, production. No syncope. He admits to dietary indiscretion with sodium. He notes that his bp is improved. He feels well. He is exercising regularly.  Allergies  Allergen Reactions   Amlodipine Swelling   Crestor [Rosuvastatin] Other (See Comments)    Per pt unable to focus     Current Outpatient Medications  Medication Sig Dispense Refill   albuterol (VENTOLIN HFA) 108 (90 Base) MCG/ACT inhaler TAKE 2 PUFFS BY MOUTH EVERY 6 HOURS AS NEEDED FOR WHEEZE OR SHORTNESS OF BREATH 8.5 each 1   atorvastatin (LIPITOR) 20 MG tablet TAKE 1 TABLET BY MOUTH EVERY DAY 90 tablet 0   carvedilol (COREG) 25 MG tablet TAKE 1 AND 1/2 TABLETS (37.5 MG TOTAL) BY MOUTH 2 (TWO) TIMES DAILY WITH A MEAL. 90 tablet 0   empagliflozin (JARDIANCE) 25 MG TABS tablet Take 1 tablet (25 mg total) by mouth daily before breakfast. 90 tablet 3   ENTRESTO 49-51 MG TAKE 1 TABLET BY MOUTH TWICE A DAY 180 tablet 3   ferrous sulfate 325 (65 FE) MG tablet Take 325 mg by mouth every other day.     finasteride (PROSCAR) 5 MG tablet Take 5 mg by mouth daily.     FLUAD QUADRIVALENT 0.5 ML injection      Ginkgo Biloba 40 MG TABS Take 1 tablet by mouth daily.      glucosamine-chondroitin 500-400 MG tablet Take 1 tablet by mouth daily.     latanoprost (XALATAN) 0.005 % ophthalmic solution Place 1 drop into both eyes at bedtime.      metFORMIN (GLUCOPHAGE) 1000 MG tablet TAKE 1 TABLET (1,000 MG TOTAL) BY MOUTH TWICE A DAY WITH FOOD (Patient taking differently: Take 1,000 mg by mouth daily with supper.) 180 tablet 0   Multiple Vitamin (MULTIVITAMIN WITH MINERALS) TABS Take 1 tablet by mouth daily.      Omega-3 Fatty Acids (FISH OIL BURP-LESS PO) Take 1 capsule by mouth every other day.      ONETOUCH ULTRA test strip USE AS  INSTRUCTED 100 strip 12   tamsulosin (FLOMAX) 0.4 MG CAPS capsule Take 0.4 mg by mouth at bedtime.     No current facility-administered medications for this visit.     Past Medical History:  Diagnosis Date   Anemia    Cataract    Chronic systolic (congestive) heart failure (HCC)    Diabetes mellitus    GERD (gastroesophageal reflux disease)    Hyperlipidemia    Hypertension    Iron deficiency anemia due to chronic blood loss 10/02/2019   MGUS (monoclonal gammopathy of unknown significance)    Multiple myeloma ([place]) 02/2013   normal cytogenetics and FISH panel on 03/11/2013.    Osteoarthritis    Presence of permanent cardiac pacemaker    Right bundle branch block    Syncope     ROS:   All systems reviewed and negative except as noted in the HPI.   Past Surgical History:  Procedure Laterality Date   BIV UPGRADE  01/04/2018   BIV UPGRADE N/A 01/04/2018   Procedure: BIVP UPGRADE;  Surgeon: [name], MD;  Location: [facility];  Service: Cardiovascular;  Laterality: N/A;   EP IMPLANTABLE DEVICE N/A 08/09/2015   Procedure: Pacemaker Implant;  Surgeon: Champ Mungo  Lovena Le, MD;  Location: [facility];  Service: Cardiovascular;  Laterality: N/A;   EYE SURGERY       Family History  Problem Relation Age of Onset   Hypertension Mother    Cancer Mother    Stroke Mother    Hypertension Sister    Hypertension Brother    Hypertension Maternal Grandmother      Social History   Socioeconomic History   Marital status: Married    Spouse name: Not on file   Number of children: 3   Years of education: Not on file   Highest education level: Not on file  Occupational History   Not on file  Tobacco Use   Smoking status: Former    Packs/day: 2.00    Years: 35.00    Total pack years: 70.00    Types: Cigarettes    Start date: 03/26/1963    Quit date: 12/18/1997    Years since quitting: 25.1   Smokeless tobacco: Never   Tobacco comments:    quit 15 years  Vaping  Use   Vaping Use: Never used  Substance and Sexual Activity   Alcohol use: No    Alcohol/week: 0.0 standard drinks of alcohol   Drug use: No   Sexual activity: Yes    Birth control/protection: None  Other Topics Concern   Not on file  Social History Narrative   Married   Building Maintenance   Social Determinants of Health   Financial Resource Strain: Low Risk  (11/22/2022)   Overall Financial Resource Strain (CARDIA)    Difficulty of Paying Living Expenses: Not hard at all  Food Insecurity: No Food Insecurity (11/22/2022)   Hunger Vital Sign    Worried About Running Out of Food in the Last Year: Never true    Ran Out of Food in the Last Year: Never true  Transportation Needs: No Transportation Needs (11/22/2022)   PRAPARE - Hydrologist (Medical): No    Lack of Transportation (Non-Medical): No  Physical Activity: Insufficiently Active (11/22/2022)   Exercise Vital Sign    Days of Exercise per Week: 5 days    Minutes of Exercise per Session: 20 min  Stress: No Stress Concern Present (11/22/2022)   [place]    Feeling of Stress : Not at all  Social Connections: Moderately Integrated (11/22/2022)   Social Connection and Isolation Panel [NHANES]    Frequency of Communication with Friends and Family: More than three times a week    Frequency of Social Gatherings with Friends and Family: More than three times a week    Attends Religious Services: More than 4 times per year    Active Member of [employer] or Organizations: No    Attends Archivist Meetings: Never    Marital Status: Married  Human resources officer Violence: Not At Risk (11/22/2022)   Humiliation, Afraid, Rape, and Kick questionnaire    Fear of Current or Ex-Partner: No    Emotionally Abused: No    Physically Abused: No    Sexually Abused: No     BP 112/72   Pulse 63   Ht 5' 10"$  (1.778 m)   Wt 206 lb (93.4 kg)   SpO2  98%   BMI 29.56 kg/m   Physical Exam:  Well appearing NAD HEENT: Unremarkable Neck:  No JVD, no thyromegally Lymphatics:  No adenopathy Back:  No CVA tenderness Lungs:  Clear HEART:  Regular rate rhythm,  no murmurs, no rubs, no clicks Abd:  soft, positive bowel sounds, no organomegally, no rebound, no guarding Ext:  2 plus pulses, no edema, no cyanosis, no clubbing Skin:  No rashes no nodules Neuro:  CN II through XII intact, motor grossly intact  EKG - nsr with av pacing  DEVICE  Normal device function.  See PaceArt for details.   Assess/Plan: 1. Chronic systolic heart failure - he is asymptomatic, s/p biv ICD. 2. ICD - his St. Jude Biv ICD is working normally. 3. CHB - he is asymptomatic s/p biv device 4. Hyponatremia - he has reduced his fluid intake. We will follow.    Carleene Overlie Mackynzie Woolford,MD

## 2023-02-02 ENCOUNTER — Other Ambulatory Visit: Payer: Medicare Other | Admitting: Pharmacist

## 2023-02-02 DIAGNOSIS — I10 Essential (primary) hypertension: Secondary | ICD-10-CM

## 2023-02-02 DIAGNOSIS — N189 Chronic kidney disease, unspecified: Secondary | ICD-10-CM

## 2023-02-02 DIAGNOSIS — E0829 Diabetes mellitus due to underlying condition with other diabetic kidney complication: Secondary | ICD-10-CM

## 2023-02-02 NOTE — Progress Notes (Signed)
Pharmacy Note  02/02/2023 Name: Michael Mongar Sr. MRN: [id number] DOB: 1946-01-08  Subjective: Michael Kris Sr. is a 77 y.o. year old male who is a primary care patient of [name], [name], MD. Clinical Pharmacist Practitioner referral was placed to assist with medication, CHF and diabetes management.     Engaged with patient by telephone for follow up visit today.  Type 2 DM -  Current therapy - Jardiance 30m daily and metformin 10048monce a day with evening meal Past Medications tried: glipizide stopped due to low blood glucose in the afternoon.  Patient's home blood glucose over the last 2 weeks - see Continuous Glucose Monitor report below (patient is using samples provided in office).  Blood glucose noted to be at goal between meals. But see a spike after am meal and evening meal (though patient feel blood glucose starts to increase in early morning and that increase is related to exercise)         Hypertension:   Current medication: carvedilol 2535mwice a day, Entresto 49/40m40mice a day.  Checks blood pressure a few times per week - reports SBP 120 to 140's and DBP 70 to 80's  BP Readings from Last 3 Encounters:  02/01/23 112/72  12/20/22 (!) 145/80  07/12/22 132/80     CKD 3b - Last visit with nephrology / [facility] was 01/08/2023  Objective: Review of patient status, including review of consultants reports, laboratory and other test data, was performed as part of comprehensive evaluation and provision of chronic care management services.   Lab Results  Component Value Date   CREATININE 1.59 (H) 12/20/2022   CREATININE 1.38 (H) 06/05/2022   CREATININE 1.21 10/04/2021    Lab Results  Component Value Date   HGBA1C 8.5 (H) 12/20/2022       Component Value Date/Time   CHOL 129 12/20/2022 0949   CHOL 151 12/26/2017 0847   TRIG 71.0 12/20/2022 0949   HDL 33.40 (L) 12/20/2022 0949   HDL 34 (L) 12/26/2017 0847   CHOLHDL 4 12/20/2022 0949   VLDL  14.2 12/20/2022 0949   LDLCALC 81 12/20/2022 0949   LDLCALC 104 (H) 12/26/2017 0847     Clinical ASCVD: Yes  The ASCVD Risk score (Arnett DK, et al., 2019) failed to calculate for the following reasons:   The valid total cholesterol range is 130 to 320 mg/dL    BP Readings from Last 3 Encounters:  02/01/23 112/72  12/20/22 (!) 145/80  07/12/22 132/80     Allergies  Allergen Reactions   Amlodipine Swelling   Crestor [Rosuvastatin] Other (See Comments)    Per pt unable to focus    Medications Reviewed Today     Reviewed by EckaCherre RobinsH-CPP (Pharmacist) on 02/02/23 at 1047  Med List Status: <None>   Medication Order Taking? Sig Documenting Provider Last Dose Status Informant  albuterol (VENTOLIN HFA) 108 (90 Base) MCG/ACT inhaler [id number] TAKE 2 PUFFS BY MOUTH EVERY 6 HOURS AS NEEDED FOR WHEEZE OR SHORTNESS OF BREATH [name], JessGay Filler Taking Active   atorvastatin (LIPITOR) 20 MG tablet [id number] TAKE 1 TABLET BY MOUTH EVERY DAY TaylEvans Lance Taking Active   carvedilol (COREG) 25 MG tablet [id number] TAKE 1 AND 1/2 TABLETS (37.5 MG TOTAL) BY MOUTH 2 (TWO) TIMES DAILY WITH A MEAL. TaylEvans Lance Taking Active   empagliflozin (JARDIANCE) 25 MG TABS tablet [id number] Take 1 tablet (25 mg total) by mouth daily before  breakfast. [name], [name], MD Taking Active   ENTRESTO 49-51 MG [id number] Yes TAKE 1 TABLET BY MOUTH TWICE A DAY [name], [name], MD Taking Active   ferrous sulfate 325 (65 FE) MG tablet [id number] Yes Take 325 mg by mouth every other day. [provider] Taking Active Self  finasteride (PROSCAR) 5 MG tablet [id number] Yes Take 5 mg by mouth daily. [provider] Taking Active Self  FLUAD QUADRIVALENT 0.5 ML injection [id number] Yes  [provider] Taking Active   Ginkgo Biloba 40 MG TABS [id number] Yes Take 1 tablet by mouth daily.  [provider] Taking Active Self  glucosamine-chondroitin  500-400 MG tablet [id number] Yes Take 1 tablet by mouth daily. [provider] Taking Active   latanoprost (XALATAN) 0.005 % ophthalmic solution [id number] Yes Place 1 drop into both eyes at bedtime.  [provider] Taking Active Self  metFORMIN (GLUCOPHAGE) 1000 MG tablet [id number] Yes TAKE 1 TABLET (1,000 MG TOTAL) BY MOUTH TWICE A DAY WITH FOOD  Patient taking differently: Take 1,000 mg by mouth daily with supper.   [name], [name], MD Taking Active   Multiple Vitamin (MULTIVITAMIN WITH MINERALS) TABS [id number] Yes Take 1 tablet by mouth daily.  [provider] Taking Active Self  Omega-3 Fatty Acids (FISH OIL BURP-LESS PO) [id number] Yes Take 1 capsule by mouth every other day.  [provider] Taking Active Self  Donald Siva test strip [id number] Yes USE AS INSTRUCTED [name], [name], MD Taking Active   tamsulosin (FLOMAX) 0.4 MG CAPS capsule [id number] Yes Take 0.4 mg by mouth at bedtime. [provider] Taking Active             Patient Active Problem List   Diagnosis Date Noted   Chronic kidney disease (CKD), active medical management without dialysis 10/09/2019   Iron deficiency anemia due to chronic blood loss [id number]   Chronic systolic (congestive) heart failure ([place]) 01/04/2018   Pacemaker 12/08/2015   Mobitz type 2 second degree heart block 08/08/2015   Right bundle branch block 12/16/2014   Syncope 12/16/2014   Proteinuria due to type 2 diabetes mellitus ([place]) 11/12/2014   Encounter for therapeutic drug monitoring 03/13/2013   Multiple myeloma ([place]) 02/15/2013   Edema 01/07/2013   Diabetes mellitus due to underlying condition with other diabetic kidney complication (HCC)    Hyperlipidemia    Hypertension    MGUS (monoclonal gammopathy of unknown significance)    GERD (gastroesophageal reflux disease)      Medication Assistance:   Approved for cardiomyopathy funds thru [employer] - available thru  06/18/2023   Assessment / Plan: Type 2 DM - Last A1c not at goal of < 7.0% with postprandial highs after am and pm meals Continue to wear DexCom Continuous Glucose Monitor - has about 5 days left on current sensor For post prandial elevations would consider the following 1) short acting secretagogue like repaglinide 0.30m with morning meal to start (can add with evening meal if needed later) - monitor for hypoglycemia. 2) alpha glucosidase inhibitor  like acarbose 239mwith morning meal to stary (can add with evening meal or increase dose if needed later) - has more GI side effects    Hypertension - blood pressure goal is < 130 /80 due to CKD and DM Patient's blood pressure improved. Continue Entresto and carvedilol.  Continue to check blood pressure at home.   CKD 3b -  Recommended continue Jardiance as it as benefits for DM, CKD and CHF.  Continue to follow up with nephrology    Cherre Robins, PharmD Clinical Pharmacist [place] [phone]

## 2023-02-05 DIAGNOSIS — L602 Onychogryphosis: Secondary | ICD-10-CM | POA: Diagnosis not present

## 2023-02-05 DIAGNOSIS — M2022 Hallux rigidus, left foot: Secondary | ICD-10-CM | POA: Diagnosis not present

## 2023-02-05 DIAGNOSIS — B351 Tinea unguium: Secondary | ICD-10-CM | POA: Diagnosis not present

## 2023-02-07 DIAGNOSIS — H34811 Central retinal vein occlusion, right eye, with macular edema: Secondary | ICD-10-CM | POA: Diagnosis not present

## 2023-02-12 ENCOUNTER — Other Ambulatory Visit: Payer: Self-pay | Admitting: Family Medicine

## 2023-02-20 ENCOUNTER — Encounter: Payer: Self-pay | Admitting: Family Medicine

## 2023-02-26 ENCOUNTER — Encounter: Payer: Self-pay | Admitting: Pharmacist

## 2023-03-01 DIAGNOSIS — N183 Chronic kidney disease, stage 3 unspecified: Secondary | ICD-10-CM | POA: Diagnosis not present

## 2023-03-01 DIAGNOSIS — R3914 Feeling of incomplete bladder emptying: Secondary | ICD-10-CM | POA: Diagnosis not present

## 2023-03-02 DIAGNOSIS — H401131 Primary open-angle glaucoma, bilateral, mild stage: Secondary | ICD-10-CM | POA: Diagnosis not present

## 2023-03-06 ENCOUNTER — Encounter: Payer: Self-pay | Admitting: Family Medicine

## 2023-03-06 DIAGNOSIS — H9319 Tinnitus, unspecified ear: Secondary | ICD-10-CM

## 2023-03-12 ENCOUNTER — Other Ambulatory Visit: Payer: Self-pay | Admitting: Family Medicine

## 2023-03-16 ENCOUNTER — Other Ambulatory Visit: Payer: Self-pay | Admitting: Internal Medicine

## 2023-03-16 MED ORDER — ATORVASTATIN CALCIUM 20 MG PO TABS: 20.0000 mg | ORAL_TABLET | Freq: Every day | ORAL | 3 refills | Status: DC

## 2023-04-01 ENCOUNTER — Other Ambulatory Visit: Payer: Self-pay | Admitting: Internal Medicine

## 2023-04-03 DIAGNOSIS — L602 Onychogryphosis: Secondary | ICD-10-CM | POA: Diagnosis not present

## 2023-04-03 DIAGNOSIS — M2022 Hallux rigidus, left foot: Secondary | ICD-10-CM | POA: Diagnosis not present

## 2023-04-03 DIAGNOSIS — E1151 Type 2 diabetes mellitus with diabetic peripheral angiopathy without gangrene: Secondary | ICD-10-CM | POA: Diagnosis not present

## 2023-04-05 ENCOUNTER — Ambulatory Visit (INDEPENDENT_AMBULATORY_CARE_PROVIDER_SITE_OTHER): Payer: Medicare Other

## 2023-04-05 DIAGNOSIS — I441 Atrioventricular block, second degree: Secondary | ICD-10-CM | POA: Diagnosis not present

## 2023-04-05 LAB — CUP PACEART REMOTE DEVICE CHECK
Battery Remaining Longevity: 32 mo
Battery Remaining Percentage: 36 %
Battery Voltage: 2.96 V
Brady Statistic AP VP Percent: 36 %
Brady Statistic AP VS Percent: 1 %
Brady Statistic AS VP Percent: 63 %
Brady Statistic AS VS Percent: 1 %
Brady Statistic RA Percent Paced: 35 %
Date Time Interrogation Session: 20240418051906
Implantable Lead Connection Status: 753985
Implantable Lead Connection Status: 753985
Implantable Lead Connection Status: 753985
Implantable Lead Implant Date: 20160822
Implantable Lead Implant Date: 20160822
Implantable Lead Implant Date: 20190118
Implantable Lead Location: 753858
Implantable Lead Location: 753859
Implantable Lead Location: 753860
Implantable Pulse Generator Implant Date: 20190118
Lead Channel Impedance Value: 1200 Ohm
Lead Channel Impedance Value: 400 Ohm
Lead Channel Impedance Value: 560 Ohm
Lead Channel Pacing Threshold Amplitude: 1.125 V
Lead Channel Pacing Threshold Amplitude: 1.125 V
Lead Channel Pacing Threshold Amplitude: 1.75 V
Lead Channel Pacing Threshold Pulse Width: 0.5 ms
Lead Channel Pacing Threshold Pulse Width: 0.5 ms
Lead Channel Pacing Threshold Pulse Width: 0.5 ms
Lead Channel Sensing Intrinsic Amplitude: 12 mV
Lead Channel Sensing Intrinsic Amplitude: 2.1 mV
Lead Channel Setting Pacing Amplitude: 2.125
Lead Channel Setting Pacing Amplitude: 2.125
Lead Channel Setting Pacing Amplitude: 2.75 V
Lead Channel Setting Pacing Pulse Width: 0.5 ms
Lead Channel Setting Pacing Pulse Width: 0.5 ms
Lead Channel Setting Sensing Sensitivity: 4 mV
Pulse Gen Model: 3262
Pulse Gen Serial Number: 8983138

## 2023-04-30 DIAGNOSIS — R3914 Feeling of incomplete bladder emptying: Secondary | ICD-10-CM | POA: Diagnosis not present

## 2023-05-07 NOTE — Progress Notes (Signed)
Remote pacemaker transmission.   

## 2023-05-10 DIAGNOSIS — M19071 Primary osteoarthritis, right ankle and foot: Secondary | ICD-10-CM | POA: Diagnosis not present

## 2023-05-10 DIAGNOSIS — E1151 Type 2 diabetes mellitus with diabetic peripheral angiopathy without gangrene: Secondary | ICD-10-CM | POA: Diagnosis not present

## 2023-05-10 DIAGNOSIS — M19072 Primary osteoarthritis, left ankle and foot: Secondary | ICD-10-CM | POA: Diagnosis not present

## 2023-05-10 DIAGNOSIS — M2021 Hallux rigidus, right foot: Secondary | ICD-10-CM | POA: Diagnosis not present

## 2023-05-10 DIAGNOSIS — L602 Onychogryphosis: Secondary | ICD-10-CM | POA: Diagnosis not present

## 2023-05-10 DIAGNOSIS — M2141 Flat foot [pes planus] (acquired), right foot: Secondary | ICD-10-CM | POA: Diagnosis not present

## 2023-05-10 DIAGNOSIS — M2142 Flat foot [pes planus] (acquired), left foot: Secondary | ICD-10-CM | POA: Diagnosis not present

## 2023-05-10 DIAGNOSIS — L84 Corns and callosities: Secondary | ICD-10-CM | POA: Diagnosis not present

## 2023-05-11 ENCOUNTER — Encounter: Payer: Self-pay | Admitting: Family Medicine

## 2023-05-11 ENCOUNTER — Encounter: Payer: Self-pay | Admitting: Internal Medicine

## 2023-05-11 NOTE — Telephone Encounter (Signed)
Last OV: 12/20/22, no f/u noted.

## 2023-05-17 DIAGNOSIS — L299 Pruritus, unspecified: Secondary | ICD-10-CM | POA: Diagnosis not present

## 2023-05-17 DIAGNOSIS — H9313 Tinnitus, bilateral: Secondary | ICD-10-CM | POA: Diagnosis not present

## 2023-05-17 DIAGNOSIS — H9319 Tinnitus, unspecified ear: Secondary | ICD-10-CM | POA: Diagnosis not present

## 2023-05-17 DIAGNOSIS — H903 Sensorineural hearing loss, bilateral: Secondary | ICD-10-CM | POA: Diagnosis not present

## 2023-05-17 DIAGNOSIS — H9193 Unspecified hearing loss, bilateral: Secondary | ICD-10-CM | POA: Diagnosis not present

## 2023-05-23 ENCOUNTER — Encounter: Payer: Self-pay | Admitting: Family Medicine

## 2023-05-23 ENCOUNTER — Ambulatory Visit (INDEPENDENT_AMBULATORY_CARE_PROVIDER_SITE_OTHER): Payer: Medicare Other | Admitting: Family Medicine

## 2023-05-23 VITALS — BP 150/95 | HR 79 | Ht 70.0 in | Wt 208.2 lb

## 2023-05-23 DIAGNOSIS — R918 Other nonspecific abnormal finding of lung field: Secondary | ICD-10-CM

## 2023-05-23 DIAGNOSIS — Z7984 Long term (current) use of oral hypoglycemic drugs: Secondary | ICD-10-CM

## 2023-05-23 DIAGNOSIS — Z7985 Long-term (current) use of injectable non-insulin antidiabetic drugs: Secondary | ICD-10-CM

## 2023-05-23 DIAGNOSIS — N189 Chronic kidney disease, unspecified: Secondary | ICD-10-CM

## 2023-05-23 DIAGNOSIS — Z122 Encounter for screening for malignant neoplasm of respiratory organs: Secondary | ICD-10-CM

## 2023-05-23 DIAGNOSIS — E1122 Type 2 diabetes mellitus with diabetic chronic kidney disease: Secondary | ICD-10-CM | POA: Diagnosis not present

## 2023-05-23 DIAGNOSIS — E785 Hyperlipidemia, unspecified: Secondary | ICD-10-CM

## 2023-05-23 DIAGNOSIS — D472 Monoclonal gammopathy: Secondary | ICD-10-CM | POA: Diagnosis not present

## 2023-05-23 DIAGNOSIS — I10 Essential (primary) hypertension: Secondary | ICD-10-CM

## 2023-05-23 LAB — BASIC METABOLIC PANEL
BUN: 25 mg/dL — ABNORMAL HIGH (ref 6–23)
CO2: 21 mEq/L (ref 19–32)
Calcium: 9.3 mg/dL (ref 8.4–10.5)
Chloride: 103 mEq/L (ref 96–112)
Creatinine, Ser: 1.62 mg/dL — ABNORMAL HIGH (ref 0.40–1.50)
GFR: 40.67 mL/min — ABNORMAL LOW (ref >60.00)
Glucose, Bld: 152 mg/dL — ABNORMAL HIGH (ref 70–99)
Potassium: 4.1 mEq/L (ref 3.5–5.1)
Sodium: 137 mEq/L (ref 135–145)

## 2023-05-23 LAB — HEMOGLOBIN A1C: Hgb A1c MFr Bld: 9.2 % — ABNORMAL HIGH (ref 4.6–6.5)

## 2023-05-23 MED ORDER — TRULICITY 0.75 MG/0.5ML ~~LOC~~ SOAJ: 0.7500 mg | SUBCUTANEOUS | 1 refills | Status: DC

## 2023-05-23 NOTE — Patient Instructions (Signed)
It was good to see you today- I will be in touch with your labs and we will make a plan to handle your diabetes!   Please stop by imaging on the ground floor and set up your lung cancer screening CT scan.  They may be able to do it today if we're lucky!    Will figure out next visit pending your labs, but probably 3-4 months

## 2023-05-23 NOTE — Progress Notes (Signed)
[facility] [address] [place], [place] [address] [phone]  Date:  05/23/2023   Name:  Michael Debruyne Sr.   DOB:  [birth year]   MRN:  [id number]  PCP:  Pearline Cables, MD    Chief Complaint: Follow-up (Pt believes he may need to start insulin /Pt is taking one jardiance and one metformin and sugars are running high )   History of Present Illness:  Michael Playford Sr. is a 77 y.o. very pleasant male patient who presents with the following:  Pt seen today for a recheck visit Last seen by myself in January of this year  History of diabetes, proteinuria, chronic kidney disease, CHF, hypertension, Mobitz 2 heart block with pacemaker, hyperlipidemia, MGUS/multiple myeloma and iron deficiency anemia    He tends to be quite physically active, he works physically and also walks on his treadmill Urology- Dr Mena Goes - is monitoring him for BPH and elevated  He is using finasteride and tamsulosin and notes improvement of LUTS  Last A1c a bit elevated-  we did have to drop his metformin to one daily due to reduced kidney function -Kao is concerned will be elevated more this time, we discussed potentially adding a GLP-1 Lab Results  Component Value Date   HGBA1C 8.5 (H) 12/20/2022   Seen by cardiology in February - Dr Ladona Ridgel  Assess/Plan: 1. Chronic systolic heart failure - he is asymptomatic, s/p biv ICD. 2. ICD - his St. Jude Biv ICD is working normally. 3. CHB - he is asymptomatic s/p biv device 4. Hyponatremia - he has reduced his fluid intake. We will follow.   Pt notes his glucose has been too high on metformin and Jardiance  He notes max glucose may be 160- 180 fasting in the am   He notes his home BP has been under good control- perhaps 135/85 prior to meds in the am -he has regular blood pressure checks at home BP Readings from Last 3 Encounters:  05/23/23 (!) 150/95  02/01/23 112/72  12/20/22 (!) 145/80    No contra to GLP-1 in self or family  Patient Active Problem List   Diagnosis Date Noted   Chronic kidney disease (CKD), active medical management without dialysis 10/09/2019   Iron deficiency anemia due to chronic blood loss 10/02/2019   Chronic systolic (congestive) heart failure (HCC) 01/04/2018   Pacemaker 12/08/2015   Mobitz type 2 second degree heart block 08/08/2015   Right bundle branch block 12/16/2014   Syncope 12/16/2014   Proteinuria due to type 2 diabetes mellitus (HCC) 11/12/2014   Encounter for therapeutic drug monitoring 03/13/2013   Multiple myeloma (HCC) 02/15/2013   Edema 01/07/2013   Diabetes mellitus due to underlying condition with other diabetic kidney complication (HCC)    Hyperlipidemia    Hypertension    MGUS (monoclonal gammopathy of unknown significance)    GERD (gastroesophageal reflux disease)     Past Medical History:  Diagnosis Date   Anemia    Cataract    Chronic systolic (congestive) heart failure (HCC)    Diabetes mellitus    GERD (gastroesophageal reflux disease)    Hyperlipidemia    Hypertension    Iron deficiency anemia due to chronic blood loss 10/02/2019   MGUS (monoclonal gammopathy of unknown significance)    Multiple myeloma (HCC) 02/2013   normal cytogenetics and FISH panel on 03/11/2013.    Osteoarthritis    Presence of permanent cardiac pacemaker  Right bundle branch block    Syncope     Past Surgical History:  Procedure Laterality Date   BIV UPGRADE  01/04/2018   BIV UPGRADE N/A 01/04/2018   Procedure: BIVP UPGRADE;  Surgeon: Marinus Maw, MD;  Location: [facility];  Service: Cardiovascular;  Laterality: N/A;   EP IMPLANTABLE DEVICE N/A 08/09/2015   Procedure: Pacemaker Implant;  Surgeon: Marinus Maw, MD;  Location: [facility];  Service: Cardiovascular;  Laterality: N/A;   EYE SURGERY      Social History   Tobacco Use   Smoking status: Former    Packs/day: 2.00    Years: 35.00    Additional  pack years: 0.00    Total pack years: 70.00    Types: Cigarettes    Start date: 03/26/1963    Quit date: 12/18/1997    Years since quitting: 25.4   Smokeless tobacco: Never   Tobacco comments:    quit 15 years  Vaping Use   Vaping Use: Never used  Substance Use Topics   Alcohol use: No    Alcohol/week: 0.0 standard drinks of alcohol   Drug use: No    Family History  Problem Relation Age of Onset   Hypertension Mother    Cancer Mother    Stroke Mother    Hypertension Sister    Hypertension Brother    Hypertension Maternal Grandmother     Allergies  Allergen Reactions   Amlodipine Swelling   Crestor [Rosuvastatin] Other (See Comments)    Per pt unable to focus    Medication list has been reviewed and updated.  Current Outpatient Medications on File Prior to Visit  Medication Sig Dispense Refill   albuterol (VENTOLIN HFA) 108 (90 Base) MCG/ACT inhaler TAKE 2 PUFFS BY MOUTH EVERY 6 HOURS AS NEEDED FOR WHEEZE OR SHORTNESS OF BREATH 8.5 each 1   atorvastatin (LIPITOR) 20 MG tablet Take 1 tablet (20 mg total) by mouth daily. 90 tablet 3   carvedilol (COREG) 25 MG tablet TAKE 1 AND 1/2 TABLETS (37.5 MG TOTAL) BY MOUTH 2 (TWO) TIMES DAILY WITH A MEAL. 135 tablet 3   empagliflozin (JARDIANCE) 25 MG TABS tablet Take 1 tablet (25 mg total) by mouth daily before breakfast. 90 tablet 3   ENTRESTO 49-51 MG TAKE 1 TABLET BY MOUTH TWICE A DAY 180 tablet 3   ferrous sulfate 325 (65 FE) MG tablet Take 325 mg by mouth every other day.     finasteride (PROSCAR) 5 MG tablet Take 5 mg by mouth daily.     FLUAD QUADRIVALENT 0.5 ML injection      Ginkgo Biloba 40 MG TABS Take 1 tablet by mouth daily.      glucosamine-chondroitin 500-400 MG tablet Take 1 tablet by mouth daily.     glucose blood (ONETOUCH ULTRA) test strip Check blood sugars once daily 100 strip 12   latanoprost (XALATAN) 0.005 % ophthalmic solution Place 1 drop into both eyes at bedtime.      metFORMIN (GLUCOPHAGE) 1000 MG tablet  TAKE 1 TABLET (1,000 MG TOTAL) BY MOUTH TWICE A DAY WITH FOOD 180 tablet 0   Multiple Vitamin (MULTIVITAMIN WITH MINERALS) TABS Take 1 tablet by mouth daily.      tamsulosin (FLOMAX) 0.4 MG CAPS capsule Take 0.4 mg by mouth at bedtime.     No current facility-administered medications on file prior to visit.    Review of Systems:  As per HPI- otherwise negative.   Physical Examination: Vitals:  05/23/23 0828 05/23/23 0853  BP: (!) 162/88 (!) 150/95  Pulse: 79   SpO2: 98%    Vitals:   05/23/23 0828  Weight: 208 lb 3.2 oz (94.4 kg)  Height: 5\' 10"  (1.778 m)   Body mass index is 29.87 kg/m. Ideal Body Weight: Weight in (lb) to have BMI = 25: 173.9  GEN: no acute distress.  Normal weight per patient, looks well HEENT: Atraumatic, Normocephalic.  Ears and Nose: No external deformity. CV: RRR, No M/G/R. No JVD. No thrill. No extra heart sounds. PULM: CTA B, no wheezes, crackles, rhonchi. No retractions. No resp. distress. No accessory muscle use. ABD: S, NT, ND, +BS. No rebound. No HSM.  Benign abdomen EXTR: No c/c/e PSYCH: Normally interactive. Conversant.    Assessment and Plan: Type 2 diabetes mellitus with chronic kidney disease, without long-term current use of insulin, unspecified CKD stage (HCC) - Plan: Basic metabolic panel, Hemoglobin A1c  Chronic kidney disease with active medical management without dialysis, unspecified CKD stage  Primary hypertension  MGUS (monoclonal gammopathy of unknown significance)  Hyperlipidemia, unspecified hyperlipidemia type  Screening for lung cancer - Plan: CANCELED: CT CHEST LUNG CA SCREEN LOW DOSE W/O CM  Lung nodules - Plan: CT Chest Wo Contrast Elevated BP today-Watson will continue checking his blood pressure regularly, if it does not return to normal he will alert me Under new guidelines should qualify for lung cancer screening.  Discussed and he is interested, ordered for him -As it turns out he does not qualify for the  screening protocol, likely due to previous CT scan to monitor lung nodules.  Ordered a regular CT chest no contrast Diabetes has been more difficult to control, we had to scale back metformin due to his renal function.  Patient is concerned his A1c will be high.  I will be in touch pending A1c, most likely we will add a GLP-1 to his regimen  Signed Abbe Amsterdam, MD  Received labs as below, message to patient  Results for orders placed or performed in visit on 05/23/23  Basic metabolic panel  Result Value Ref Range   Sodium 137 135 - 145 mEq/L   Potassium 4.1 3.5 - 5.1 mEq/L   Chloride 103 96 - 112 mEq/L   CO2 21 19 - 32 mEq/L   Glucose, Bld 152 (H) 70 - 99 mg/dL   BUN 25 (H) 6 - 23 mg/dL   Creatinine, Ser 1.61 (H) 0.40 - 1.50 mg/dL   GFR 09.60 (L) >45.40 mL/min   Calcium 9.3 8.4 - 10.5 mg/dL  Hemoglobin J8J  Result Value Ref Range   Hgb A1c MFr Bld 9.2 (H) 4.6 - 6.5 %

## 2023-05-24 ENCOUNTER — Encounter: Payer: Self-pay | Admitting: Pharmacist

## 2023-05-31 DIAGNOSIS — R3914 Feeling of incomplete bladder emptying: Secondary | ICD-10-CM | POA: Diagnosis not present

## 2023-05-31 DIAGNOSIS — N183 Chronic kidney disease, stage 3 unspecified: Secondary | ICD-10-CM | POA: Diagnosis not present

## 2023-06-04 ENCOUNTER — Encounter: Payer: Self-pay | Admitting: Pharmacist

## 2023-06-04 ENCOUNTER — Encounter: Payer: Self-pay | Admitting: Family

## 2023-06-05 ENCOUNTER — Other Ambulatory Visit: Payer: Self-pay

## 2023-06-05 ENCOUNTER — Ambulatory Visit (HOSPITAL_BASED_OUTPATIENT_CLINIC_OR_DEPARTMENT_OTHER): Payer: Medicare Other

## 2023-06-05 DIAGNOSIS — D500 Iron deficiency anemia secondary to blood loss (chronic): Secondary | ICD-10-CM

## 2023-06-05 DIAGNOSIS — C9000 Multiple myeloma not having achieved remission: Secondary | ICD-10-CM

## 2023-06-06 ENCOUNTER — Inpatient Hospital Stay: Payer: Medicare Other | Admitting: Hematology & Oncology

## 2023-06-06 ENCOUNTER — Other Ambulatory Visit: Payer: Self-pay

## 2023-06-06 ENCOUNTER — Encounter: Payer: Self-pay | Admitting: Hematology & Oncology

## 2023-06-06 ENCOUNTER — Ambulatory Visit: Payer: Medicare Other

## 2023-06-06 ENCOUNTER — Inpatient Hospital Stay: Payer: Medicare Other | Attending: Hematology & Oncology

## 2023-06-06 DIAGNOSIS — C9000 Multiple myeloma not having achieved remission: Secondary | ICD-10-CM | POA: Diagnosis not present

## 2023-06-06 DIAGNOSIS — D500 Iron deficiency anemia secondary to blood loss (chronic): Secondary | ICD-10-CM

## 2023-06-06 DIAGNOSIS — E119 Type 2 diabetes mellitus without complications: Secondary | ICD-10-CM | POA: Insufficient documentation

## 2023-06-06 DIAGNOSIS — Z79899 Other long term (current) drug therapy: Secondary | ICD-10-CM | POA: Diagnosis not present

## 2023-06-06 DIAGNOSIS — D472 Monoclonal gammopathy: Secondary | ICD-10-CM | POA: Insufficient documentation

## 2023-06-06 DIAGNOSIS — Z7985 Long-term (current) use of injectable non-insulin antidiabetic drugs: Secondary | ICD-10-CM | POA: Diagnosis not present

## 2023-06-06 DIAGNOSIS — Z7984 Long term (current) use of oral hypoglycemic drugs: Secondary | ICD-10-CM | POA: Diagnosis not present

## 2023-06-06 DIAGNOSIS — D509 Iron deficiency anemia, unspecified: Secondary | ICD-10-CM | POA: Insufficient documentation

## 2023-06-06 LAB — CMP (CANCER CENTER ONLY)
ALT: 15 U/L (ref 0–44)
AST: 15 U/L (ref 15–41)
Albumin: 4.2 g/dL (ref 3.5–5.0)
Alkaline Phosphatase: 57 U/L (ref 38–126)
Anion gap: 7 (ref 5–15)
BUN: 32 mg/dL — ABNORMAL HIGH (ref 8–23)
CO2: 24 mmol/L (ref 22–32)
Calcium: 10.1 mg/dL (ref 8.9–10.3)
Chloride: 104 mmol/L (ref 98–111)
Creatinine: 1.65 mg/dL — ABNORMAL HIGH (ref 0.61–1.24)
GFR, Estimated: 43 mL/min — ABNORMAL LOW (ref >60)
Glucose, Bld: 132 mg/dL — ABNORMAL HIGH (ref 70–99)
Potassium: 4.1 mmol/L (ref 3.5–5.1)
Sodium: 135 mmol/L (ref 135–145)
Total Bilirubin: 0.5 mg/dL (ref 0.3–1.2)
Total Protein: 8.2 g/dL — ABNORMAL HIGH (ref 6.5–8.1)

## 2023-06-06 LAB — RETICULOCYTES
Immature Retic Fract: 16.7 % — ABNORMAL HIGH (ref 2.3–15.9)
RBC.: 4.56 MIL/uL (ref 4.22–5.81)
Retic Count, Absolute: 65.2 10*3/uL (ref 19.0–186.0)
Retic Ct Pct: 1.4 % (ref 0.4–3.1)

## 2023-06-06 LAB — CBC WITH DIFFERENTIAL (CANCER CENTER ONLY)
Abs Immature Granulocytes: 0.02 10*3/uL (ref 0.00–0.07)
Basophils Absolute: 0.1 10*3/uL (ref 0.0–0.1)
Basophils Relative: 1 %
Eosinophils Absolute: 0.4 10*3/uL (ref 0.0–0.5)
Eosinophils Relative: 7 %
HCT: 41.8 % (ref 39.0–52.0)
Hemoglobin: 13.5 g/dL (ref 13.0–17.0)
Immature Granulocytes: 0 %
Lymphocytes Relative: 25 %
Lymphs Abs: 1.6 10*3/uL (ref 0.7–4.0)
MCH: 29.3 pg (ref 26.0–34.0)
MCHC: 32.3 g/dL (ref 30.0–36.0)
MCV: 90.9 fL (ref 80.0–100.0)
Monocytes Absolute: 0.7 10*3/uL (ref 0.1–1.0)
Monocytes Relative: 11 %
Neutro Abs: 3.6 10*3/uL (ref 1.7–7.7)
Neutrophils Relative %: 56 %
Platelet Count: 211 10*3/uL (ref 150–400)
RBC: 4.6 MIL/uL (ref 4.22–5.81)
RDW: 14.5 % (ref 11.5–15.5)
WBC Count: 6.4 10*3/uL (ref 4.0–10.5)
nRBC: 0 % (ref 0.0–0.2)

## 2023-06-06 LAB — FERRITIN: Ferritin: 24 ng/mL (ref 24–336)

## 2023-06-06 LAB — LACTATE DEHYDROGENASE: LDH: 122 U/L (ref 98–192)

## 2023-06-06 NOTE — Progress Notes (Signed)
Hematology and Oncology Follow Up Visit  Michael Ladzinski Sr. [id number] 1946-04-24 77 y.o. 06/06/2023   Principle Diagnosis:  IgG kappa smoldering myeloma Iron deficiency anemia -- Venofer given on 03/2020  Current Therapy:   Observation     Interim History:  [name] is back for followup.  We see him once a year.  So far, he has been doing fairly well.  He is now on Trulicity for his diabetes.  I think he says his last hemoglobin A1c was 9.3.  He is still working.  He does a lot of work around houses.  I must say I have accumulated credit for being so vigorous.  When we last saw him, his monoclonal spike was up a little bit.  The monoclonal spike was 1.7 g/dL.  His IgG level was 2640 mg/dL.  The Kappa light chain was 12.5 mg/dL.  He has had no infection issues.  He has had no rashes.  There is been no bleeding.  He has had a good appetite.  His weight is holding steady.  Is been no change in bowel or bladder habits.  He has had no cough or shortness of breath.  Overall, I would say that his performance status is probably ECOG 0.    Medications:  Current Outpatient Medications:    albuterol (VENTOLIN HFA) 108 (90 Base) MCG/ACT inhaler, TAKE 2 PUFFS BY MOUTH EVERY 6 HOURS AS NEEDED FOR WHEEZE OR SHORTNESS OF BREATH, Disp: 8.5 each, Rfl: 1   atorvastatin (LIPITOR) 20 MG tablet, Take 1 tablet (20 mg total) by mouth daily., Disp: 90 tablet, Rfl: 3   carvedilol (COREG) 25 MG tablet, TAKE 1 AND 1/2 TABLETS (37.5 MG TOTAL) BY MOUTH 2 (TWO) TIMES DAILY WITH A MEAL., Disp: 135 tablet, Rfl: 3   Dulaglutide (TRULICITY) 0.75 MG/0.5ML SOPN, Inject 0.75 mg into the skin once a week., Disp: 2 mL, Rfl: 1   empagliflozin (JARDIANCE) 25 MG TABS tablet, Take 1 tablet (25 mg total) by mouth daily before breakfast., Disp: 90 tablet, Rfl: 3   ENTRESTO 49-51 MG, TAKE 1 TABLET BY MOUTH TWICE A DAY, Disp: 180 tablet, Rfl: 3   ferrous sulfate 325 (65 FE) MG tablet, Take 325 mg by mouth every other day.,  Disp: , Rfl:    finasteride (PROSCAR) 5 MG tablet, Take 5 mg by mouth daily., Disp: , Rfl:    FLUAD QUADRIVALENT 0.5 ML injection, , Disp: , Rfl:    Ginkgo Biloba 40 MG TABS, Take 1 tablet by mouth daily. , Disp: , Rfl:    glucosamine-chondroitin 500-400 MG tablet, Take 1 tablet by mouth daily., Disp: , Rfl:    glucose blood (ONETOUCH ULTRA) test strip, Check blood sugars once daily, Disp: 100 strip, Rfl: 12   latanoprost (XALATAN) 0.005 % ophthalmic solution, Place 1 drop into both eyes at bedtime. , Disp: , Rfl:    metFORMIN (GLUCOPHAGE) 1000 MG tablet, TAKE 1 TABLET (1,000 MG TOTAL) BY MOUTH TWICE A DAY WITH FOOD (Patient taking differently: Take 500 mg by mouth in the morning and at bedtime.), Disp: 180 tablet, Rfl: 0   Multiple Vitamin (MULTIVITAMIN WITH MINERALS) TABS, Take 1 tablet by mouth daily. , Disp: , Rfl:    tamsulosin (FLOMAX) 0.4 MG CAPS capsule, Take 0.4 mg by mouth at bedtime., Disp: , Rfl:   Allergies:  Allergies  Allergen Reactions   Amlodipine Swelling   Crestor [Rosuvastatin] Other (See Comments)    Per pt unable to focus    Past Medical  History, Surgical history, Social history, and Family History were reviewed and updated.  Review of Systems: Review of Systems  Constitutional: Negative.   HENT: Negative.    Eyes: Negative.   Respiratory: Negative.    Cardiovascular: Negative.   Gastrointestinal: Negative.   Genitourinary: Negative.   Musculoskeletal: Negative.   Skin: Negative.   Neurological: Negative.   Endo/Heme/Allergies: Negative.   Psychiatric/Behavioral: Negative.       Physical Exam: Vital signs are temperature of 97.3.  Pulse 64.  Blood pressure 137/88.  Weight is 206 pounds.  Physical Exam Vitals reviewed.  HENT:     Head: Normocephalic and atraumatic.  Eyes:     Pupils: Pupils are equal, round, and reactive to light.  Cardiovascular:     Rate and Rhythm: Normal rate and regular rhythm.     Heart sounds: Normal heart sounds.   Pulmonary:     Effort: Pulmonary effort is normal.     Breath sounds: Normal breath sounds.  Abdominal:     General: Bowel sounds are normal.     Palpations: Abdomen is soft.  Musculoskeletal:        General: No tenderness or deformity. Normal range of motion.     Cervical back: Normal range of motion.  Lymphadenopathy:     Cervical: No cervical adenopathy.  Skin:    General: Skin is warm and dry.     Findings: No erythema or rash.  Neurological:     Mental Status: He is alert and oriented to person, place, and time.  Psychiatric:        Behavior: Behavior normal.        Thought Content: Thought content normal.        Judgment: Judgment normal.      Lab Results  Component Value Date   WBC 6.4 06/06/2023   HGB 13.5 06/06/2023   HCT 41.8 06/06/2023   MCV 90.9 06/06/2023   PLT 211 06/06/2023     Chemistry      Component Value Date/Time   NA 135 06/06/2023 0921   NA 133 (L) 01/12/2020 1015   NA 142 10/03/2017 0910   NA 137 04/03/2017 0852   K 4.1 06/06/2023 0921   K 3.9 10/03/2017 0910   K 4.2 04/03/2017 0852   CL 104 06/06/2023 0921   CL 103 10/03/2017 0910   CL 108 (H) 05/15/2013 0912   CO2 24 06/06/2023 0921   CO2 28 10/03/2017 0910   CO2 26 04/03/2017 0852   BUN 32 (H) 06/06/2023 0921   BUN 25 01/12/2020 1015   BUN 13 10/03/2017 0910   BUN 16.3 04/03/2017 0852   CREATININE 1.65 (H) 06/06/2023 0921   CREATININE 1.07 11/16/2017 1437   CREATININE 1.0 04/03/2017 0852      Component Value Date/Time   CALCIUM 10.1 06/06/2023 0921   CALCIUM 9.3 10/03/2017 0910   CALCIUM 9.0 04/03/2017 0852   ALKPHOS 57 06/06/2023 0921   ALKPHOS 51 10/03/2017 0910   ALKPHOS 46 04/03/2017 0852   AST 15 06/06/2023 0921   AST 18 04/03/2017 0852   ALT 15 06/06/2023 0921   ALT 34 10/03/2017 0910   ALT 19 04/03/2017 0852   BILITOT 0.5 06/06/2023 0921   BILITOT 0.38 04/03/2017 0852     Impression and Plan: [name] is 77 year old gentleman with IgG kappa smoldering  myeloma.  I am trying to hold off along as possible with treating him.  We will have to see what the monoclonal studies look like today.  I noted that his total protein was up a little bit.  This may be a sign that his myeloma levels are higher.  Again, he is totally asymptomatic.  He is quite busy working.  I do not want to affect his quality of life right now.  We will still plan to get him back in 1 year.    Josph Macho, MD 6/19/202410:23 AM

## 2023-06-07 ENCOUNTER — Other Ambulatory Visit: Payer: Medicare Other | Admitting: Pharmacist

## 2023-06-07 DIAGNOSIS — E1122 Type 2 diabetes mellitus with diabetic chronic kidney disease: Secondary | ICD-10-CM

## 2023-06-07 LAB — IGG, IGA, IGM
IgA: 220 mg/dL (ref 61–437)
IgG (Immunoglobin G), Serum: 2823 mg/dL — ABNORMAL HIGH (ref 603–1613)
IgM (Immunoglobulin M), Srm: 24 mg/dL (ref 15–143)

## 2023-06-07 MED ORDER — TRULICITY 0.75 MG/0.5ML ~~LOC~~ SOPN
0.7500 mg | PEN_INJECTOR | SUBCUTANEOUS | 1 refills | Status: AC
Start: 2023-06-07 — End: 2023-06-21

## 2023-06-07 MED ORDER — OZEMPIC (0.25 OR 0.5 MG/DOSE) 2 MG/3ML ~~LOC~~ SOPN: 0.2500 mg | PEN_INJECTOR | SUBCUTANEOUS | 0 refills | Status: DC

## 2023-06-07 NOTE — Progress Notes (Signed)
Pharmacy Note  06/07/2023 Name: Michael Yano Sr. MRN: [id number] DOB: 31-Jul-1946  Subjective: Michael [place]merville Sr. is a 77 y.o. year old male who is a primary care patient of [name], Gwenlyn Found, MD. Clinical Pharmacist Practitioner referral was placed to assist with medication, CHF and diabetes management.     Engaged with patient face to face for  instruction on Ozempic use and to assist in applying for MAP    Type 2 DM -  Current therapy - Jardiance 25mg  daily, metformin 1000mg  twice a day and Truclity 0.75mg  weekly  Past Medications tried: glipizide stopped due to low blood glucose in the afternoon.   Patient reports that cost of Trulicity is too high - he his in medicare coverage gap (> $200 / month). He has tolerated Trulicity well.     HFrEF and CKD 3b:  Last visit with nephrology / [facility] was 01/08/2023 ECHO 10/25/2017 showed EF of 30% Current therapy: Entresto 49-51mg  twice a day; Jardiance 25mg  daily, carvedilol 37.5mg  twice a day  Objective: Review of patient status, including review of consultants reports, laboratory and other test data, was performed as part of comprehensive evaluation and provision of chronic care management services.   Lab Results  Component Value Date   CREATININE 1.65 (H) 06/06/2023   CREATININE 1.62 (H) 05/23/2023   CREATININE 1.59 (H) 12/20/2022    Lab Results  Component Value Date   HGBA1C 9.2 (H) 05/23/2023       Component Value Date/Time   CHOL 129 12/20/2022 0949   CHOL 151 12/26/2017 0847   TRIG 71.0 12/20/2022 0949   HDL 33.40 (L) 12/20/2022 0949   HDL 34 (L) 12/26/2017 0847   CHOLHDL 4 12/20/2022 0949   VLDL 14.2 12/20/2022 0949   LDLCALC 81 12/20/2022 0949   LDLCALC 104 (H) 12/26/2017 0847     Clinical ASCVD: Yes  The ASCVD Risk score (Arnett DK, et al., 2019) failed to calculate for the following reasons:   The valid total cholesterol range is 130 to 320 mg/dL    BP Readings from Last 3 Encounters:  05/23/23  (!) 150/95  02/01/23 112/72  12/20/22 (!) 145/80     Allergies  Allergen Reactions   Amlodipine Swelling   Crestor [Rosuvastatin] Other (See Comments)    Per pt unable to focus    Medications Reviewed Today     Reviewed by Henrene Pastor, RPH-CPP (Pharmacist) on 06/07/23 at 1103  Med List Status: <None>   Medication Order Taking? Sig Documenting Provider Last Dose Status Informant  albuterol (VENTOLIN HFA) 108 (90 Base) MCG/ACT inhaler [id number] No TAKE 2 PUFFS BY MOUTH EVERY 6 HOURS AS NEEDED FOR WHEEZE OR SHORTNESS OF BREATH [name], Gwenlyn Found, MD Taking Active   atorvastatin (LIPITOR) 20 MG tablet [id number] No Take 1 tablet (20 mg total) by mouth daily. Marinus Maw, MD Taking Active   carvedilol (COREG) 25 MG tablet [id number] No TAKE 1 AND 1/2 TABLETS (37.5 MG TOTAL) BY MOUTH 2 (TWO) TIMES DAILY WITH A MEAL. Marinus Maw, MD Taking Active   Dulaglutide (TRULICITY) 0.75 MG/0.5ML Namon Cirri [id number]  Inject 0.75 mg into the skin once a week. [name], Gwenlyn Found, MD  Active   empagliflozin (JARDIANCE) 25 MG TABS tablet [id number] No Take 1 tablet (25 mg total) by mouth daily before breakfast. [name], Gwenlyn Found, MD Taking Active   ENTRESTO 49-51 MG [id number] No TAKE 1 TABLET BY MOUTH TWICE A DAY [name], Gwenlyn Found, MD Taking Active   ferrous sulfate  325 (65 FE) MG tablet [id number] No Take 325 mg by mouth every other day. [provider] Taking Active Self  finasteride (PROSCAR) 5 MG tablet [id number] No Take 5 mg by mouth daily. [provider] Taking Active Self  FLUAD QUADRIVALENT 0.5 ML injection [id number] No  [provider] Taking Active   Ginkgo Biloba 40 MG TABS [id number] No Take 1 tablet by mouth daily.  [provider] Taking Active Self  glucosamine-chondroitin 500-400 MG tablet [id number] No Take 1 tablet by mouth daily. [provider] Taking Active   glucose blood (ONETOUCH ULTRA) test strip [id number] No Check blood sugars once daily  [name], Gwenlyn Found, MD Taking Active   latanoprost (XALATAN) 0.005 % ophthalmic solution [id number] No Place 1 drop into both eyes at bedtime.  [provider] Taking Active Self  metFORMIN (GLUCOPHAGE) 1000 MG tablet [id number] No TAKE 1 TABLET (1,000 MG TOTAL) BY MOUTH TWICE A DAY WITH FOOD  Patient taking differently: Take 500 mg by mouth in the morning and at bedtime.   [name], Gwenlyn Found, MD Taking Active   Multiple Vitamin (MULTIVITAMIN WITH MINERALS) TABS [id number] No Take 1 tablet by mouth daily.  [provider] Taking Active Self  tamsulosin (FLOMAX) 0.4 MG CAPS capsule [id number] No Take 0.4 mg by mouth at bedtime. [provider] Taking Active             Patient Active Problem List   Diagnosis Date Noted   Chronic kidney disease (CKD), active medical management without dialysis 10/09/2019   Iron deficiency anemia due to chronic blood loss 10/02/2019   Chronic systolic (congestive) heart failure (HCC) 01/04/2018   Pacemaker 12/08/2015   Mobitz type 2 second degree heart block 08/08/2015   Right bundle branch block 12/16/2014   Syncope 12/16/2014   Proteinuria due to type 2 diabetes mellitus (HCC) 11/12/2014   Encounter for therapeutic drug monitoring 03/13/2013   Multiple myeloma (HCC) 02/15/2013   Edema 01/07/2013   Diabetes mellitus due to underlying condition with other diabetic kidney complication (HCC)    Hyperlipidemia    Hypertension    MGUS (monoclonal gammopathy of unknown significance)    GERD (gastroesophageal reflux disease)     Assessment / Plan:  Medication Assistance:   Approved for cardiomyopathy funds thru [employer] - available thru 06/18/2023 ; Applied for [employer] Cardiomyopathy funds today and patient was approved 06/19/2023 thru 06/17/2024 Called [facility] and provided with new card that will take effect 06/19/2023.     Type 2 DM - Last A1c not at goal of < 7.0%  Provided patient with sample of Ozempic 0.25mg   weekly. He will start after he completes current supply of Trulicity (he has 2 doses left).  Instructed on how to use Ozempic pen and pen needles. Also discussed proper storage of Ozempic.  Assisted patient with application for Ozempic medication assistance program thru [employer]. Forwarded to PCP to review and sign.     HFrEF and CKD 3b -  Continue Jardiance, Entresto and carvedilol Continue to follow up with nephrology and cardiology   Henrene Pastor, PharmD Clinical Pharmacist [facility]  - [place] [phone]

## 2023-06-08 ENCOUNTER — Encounter: Payer: Self-pay | Admitting: Pharmacist

## 2023-06-08 LAB — KAPPA/LAMBDA LIGHT CHAINS
Kappa free light chain: 137.9 mg/L — ABNORMAL HIGH (ref 3.3–19.4)
Kappa, lambda light chain ratio: 6.05 — ABNORMAL HIGH (ref 0.26–1.65)
Lambda free light chains: 22.8 mg/L (ref 5.7–26.3)

## 2023-06-11 LAB — PROTEIN ELECTROPHORESIS, SERUM
A/G Ratio: 1 (ref 0.7–1.7)
Albumin ELP: 3.9 g/dL (ref 2.9–4.4)
Alpha-1-Globulin: 0.2 g/dL (ref 0.0–0.4)
Alpha-2-Globulin: 0.8 g/dL (ref 0.4–1.0)
Beta Globulin: 0.9 g/dL (ref 0.7–1.3)
Gamma Globulin: 2.2 g/dL — ABNORMAL HIGH (ref 0.4–1.8)
Globulin, Total: 4.1 g/dL — ABNORMAL HIGH (ref 2.2–3.9)
M-Spike, %: 1.8 g/dL — ABNORMAL HIGH
Total Protein ELP: 8 g/dL (ref 6.0–8.5)

## 2023-06-13 DIAGNOSIS — H34811 Central retinal vein occlusion, right eye, with macular edema: Secondary | ICD-10-CM | POA: Diagnosis not present

## 2023-06-21 ENCOUNTER — Other Ambulatory Visit: Payer: Self-pay | Admitting: Family Medicine

## 2023-06-21 DIAGNOSIS — C9000 Multiple myeloma not having achieved remission: Secondary | ICD-10-CM

## 2023-06-28 DIAGNOSIS — H34811 Central retinal vein occlusion, right eye, with macular edema: Secondary | ICD-10-CM | POA: Diagnosis not present

## 2023-06-28 DIAGNOSIS — H40013 Open angle with borderline findings, low risk, bilateral: Secondary | ICD-10-CM | POA: Diagnosis not present

## 2023-07-03 ENCOUNTER — Ambulatory Visit (HOSPITAL_BASED_OUTPATIENT_CLINIC_OR_DEPARTMENT_OTHER)
Admission: RE | Admit: 2023-07-03 | Discharge: 2023-07-03 | Disposition: A | Discharge status: Home / Self Care | Payer: Medicare Other | Source: Ambulatory Visit | Attending: Family Medicine | Admitting: Family Medicine

## 2023-07-03 DIAGNOSIS — I700 Atherosclerosis of aorta: Secondary | ICD-10-CM | POA: Diagnosis not present

## 2023-07-03 DIAGNOSIS — R918 Other nonspecific abnormal finding of lung field: Secondary | ICD-10-CM | POA: Diagnosis not present

## 2023-07-04 ENCOUNTER — Telehealth: Payer: Self-pay

## 2023-07-04 NOTE — Telephone Encounter (Signed)
Called to let pt know that pt assistance Ozempic 0.25/0.5 mg is here in the office. Pt aware and voices understanding.

## 2023-07-05 ENCOUNTER — Ambulatory Visit (INDEPENDENT_AMBULATORY_CARE_PROVIDER_SITE_OTHER): Payer: Medicare Other

## 2023-07-05 DIAGNOSIS — I441 Atrioventricular block, second degree: Secondary | ICD-10-CM

## 2023-07-05 LAB — CUP PACEART REMOTE DEVICE CHECK
Battery Remaining Longevity: 30 mo
Battery Remaining Percentage: 33 %
Battery Voltage: 2.95 V
Brady Statistic AP VP Percent: 29 %
Brady Statistic AP VS Percent: 1 %
Brady Statistic AS VP Percent: 70 %
Brady Statistic AS VS Percent: 1 %
Brady Statistic RA Percent Paced: 28 %
Date Time Interrogation Session: 20240718040015
Implantable Lead Connection Status: 753985
Implantable Lead Connection Status: 753985
Implantable Lead Connection Status: 753985
Implantable Lead Implant Date: 20160822
Implantable Lead Implant Date: 20160822
Implantable Lead Implant Date: 20190118
Implantable Lead Location: 753858
Implantable Lead Location: 753859
Implantable Lead Location: 753860
Implantable Pulse Generator Implant Date: 20190118
Lead Channel Impedance Value: 1175 Ohm
Lead Channel Impedance Value: 440 Ohm
Lead Channel Impedance Value: 540 Ohm
Lead Channel Pacing Threshold Amplitude: 0.625 V
Lead Channel Pacing Threshold Amplitude: 1.25 V
Lead Channel Pacing Threshold Amplitude: 2 V
Lead Channel Pacing Threshold Pulse Width: 0.5 ms
Lead Channel Pacing Threshold Pulse Width: 0.5 ms
Lead Channel Pacing Threshold Pulse Width: 0.5 ms
Lead Channel Sensing Intrinsic Amplitude: 12 mV
Lead Channel Sensing Intrinsic Amplitude: 2.8 mV
Lead Channel Setting Pacing Amplitude: 1.625
Lead Channel Setting Pacing Amplitude: 2.25 V
Lead Channel Setting Pacing Amplitude: 3 V
Lead Channel Setting Pacing Pulse Width: 0.5 ms
Lead Channel Setting Pacing Pulse Width: 0.5 ms
Lead Channel Setting Sensing Sensitivity: 4 mV
Pulse Gen Model: 3262
Pulse Gen Serial Number: 8983138

## 2023-07-10 ENCOUNTER — Other Ambulatory Visit: Payer: Medicare Other | Admitting: Pharmacist

## 2023-07-10 ENCOUNTER — Telehealth: Payer: Self-pay | Admitting: Family Medicine

## 2023-07-10 ENCOUNTER — Encounter: Payer: Self-pay | Admitting: Family Medicine

## 2023-07-10 ENCOUNTER — Encounter: Payer: Self-pay | Admitting: Internal Medicine

## 2023-07-10 DIAGNOSIS — N1832 Chronic kidney disease, stage 3b: Secondary | ICD-10-CM | POA: Diagnosis not present

## 2023-07-10 DIAGNOSIS — E1122 Type 2 diabetes mellitus with diabetic chronic kidney disease: Secondary | ICD-10-CM | POA: Diagnosis not present

## 2023-07-10 DIAGNOSIS — C9000 Multiple myeloma not having achieved remission: Secondary | ICD-10-CM | POA: Diagnosis not present

## 2023-07-10 DIAGNOSIS — I5022 Chronic systolic (congestive) heart failure: Secondary | ICD-10-CM | POA: Diagnosis not present

## 2023-07-10 DIAGNOSIS — I129 Hypertensive chronic kidney disease with stage 1 through stage 4 chronic kidney disease, or unspecified chronic kidney disease: Secondary | ICD-10-CM | POA: Diagnosis not present

## 2023-07-10 NOTE — Telephone Encounter (Signed)
Pt had a chest CT on 07/03/23. Exam not resulted yet.

## 2023-07-10 NOTE — Telephone Encounter (Signed)
Pt was calling to get the results of his chest x-ray.

## 2023-07-12 ENCOUNTER — Other Ambulatory Visit: Payer: Medicare Other | Admitting: Pharmacist

## 2023-07-12 DIAGNOSIS — I10 Essential (primary) hypertension: Secondary | ICD-10-CM

## 2023-07-12 DIAGNOSIS — N189 Chronic kidney disease, unspecified: Secondary | ICD-10-CM

## 2023-07-12 DIAGNOSIS — E7800 Pure hypercholesterolemia, unspecified: Secondary | ICD-10-CM

## 2023-07-12 DIAGNOSIS — E1122 Type 2 diabetes mellitus with diabetic chronic kidney disease: Secondary | ICD-10-CM

## 2023-07-12 DIAGNOSIS — I5022 Chronic systolic (congestive) heart failure: Secondary | ICD-10-CM

## 2023-07-12 NOTE — Progress Notes (Signed)
Pharmacy Note  07/12/2023 Name: Michael Berkovich Sr. MRN: [id number] DOB: 1946/05/22  Subjective: Michael Thorstenson Sr. is a 77 y.o. year old male who is a primary care patient of [name], Gwenlyn Found, MD. Clinical Pharmacist Practitioner referral was placed to assist with medication, CHF and diabetes management.     Engaged with patient face to face for  follow up medication access, diabetes and hypertension / CHF     Type 2 DM -  Current therapy - Jardiance 25mg  daily, metformin 1000mg  - take 0.5 tablet = 500mg  twice a day and Ozempic 0.25mg  weekly.  Past Medications tried: glipizide stopped due to low blood glucose in the afternoon; Trulicity - stopped due to cost. (> $200 in coverage gap)  Denies nausea since starting Ozempic. Sometimes he does reports getting a "weird" feeling in his stomach about 3 or 4 hours after he takes Ozempic. Eating something or taking Nexium seems to help.   Weight has been stable 199 to 202 lbs.   Home blood glucose 119, 125, 90's Prior to starting Trulicity or Ozempic blood glucose was in 160's.  Diet: He has been limiting fried foods since he had CT that showed coronary artery atherosclerosis.  He has also been limiting cholesterol intake - changed to egg beaters.  Now eating [place] sausage and [place] bacon Eats 2 meals a day and 1 or 2 snacks.   Hypertension:  Current medication: carvedilol 37.5mg  twice a day, Entresto 49-51mg  twice a day.  Checks blood pressure at home twice a day. Recent home blood pressure readings:  Morning: SBP 130's and DBP 80's Evening: SBP 90 to 120 and DBP 60 to 70's  Patient reports occasional lightheadedness in the evening when he bends down - occurs about once per week. Doesn't usually happen if he bends slowly.  Patient denies - chest pain, shortness of breath, edema  HFrEF and CKD 3b:  Last visit with nephrology / [facility] was 06/2023  ECHO 10/25/2017 showed EF of 30% Current therapy: Entresto 49-51mg  twice a  day; Jardiance 25mg  daily, carvedilol 37.5mg  twice a day  Hyperlipidemia:  Taking atorvastatin 20mg  daily.  Last LDL was 81 (previously was 70) Recent CT showed coronary artery atherosclerosis. Patient is waiting for cardiologist to review and see if further studies are recommended. Might consider coronary calcium evaluation.  See above for recent dietary changes.    Objective: Review of patient status, including review of consultants reports, laboratory and other test data, was performed as part of comprehensive evaluation and provision of chronic care management services.   Lab Results  Component Value Date   CREATININE 1.65 (H) 06/06/2023   CREATININE 1.62 (H) 05/23/2023   CREATININE 1.59 (H) 12/20/2022    Lab Results  Component Value Date   HGBA1C 9.2 (H) 05/23/2023       Component Value Date/Time   CHOL 129 12/20/2022 0949   CHOL 151 12/26/2017 0847   TRIG 71.0 12/20/2022 0949   HDL 33.40 (L) 12/20/2022 0949   HDL 34 (L) 12/26/2017 0847   CHOLHDL 4 12/20/2022 0949   VLDL 14.2 12/20/2022 0949   LDLCALC 81 12/20/2022 0949   LDLCALC 104 (H) 12/26/2017 0847     Clinical ASCVD: Yes  The ASCVD Risk score (Arnett DK, et al., 2019) failed to calculate for the following reasons:   The valid total cholesterol range is 130 to 320 mg/dL    BP Readings from Last 3 Encounters:  05/23/23 (!) 150/95  02/01/23 112/72  12/20/22 (!) 145/80  Allergies  Allergen Reactions   Amlodipine Swelling   Crestor [Rosuvastatin] Other (See Comments)    Per pt unable to focus    Medications Reviewed Today     Reviewed by Henrene Pastor, RPH-CPP (Pharmacist) on 07/12/23 at 1105  Med List Status: <None>   Medication Order Taking? Sig Documenting Provider Last Dose Status Informant  albuterol (VENTOLIN HFA) 108 (90 Base) MCG/ACT inhaler [id number] Yes TAKE 2 PUFFS BY MOUTH EVERY 6 HOURS AS NEEDED FOR WHEEZE OR SHORTNESS OF BREATH [name], Gwenlyn Found, MD Taking Active   atorvastatin  (LIPITOR) 20 MG tablet [id number] Yes Take 1 tablet (20 mg total) by mouth daily. Marinus Maw, MD Taking Active   carvedilol (COREG) 25 MG tablet [id number] Yes TAKE 1 AND 1/2 TABLETS (37.5 MG TOTAL) BY MOUTH 2 (TWO) TIMES DAILY WITH A MEAL. Marinus Maw, MD Taking Active   empagliflozin (JARDIANCE) 25 MG TABS tablet [id number] Yes Take 1 tablet (25 mg total) by mouth daily before breakfast. [name], Gwenlyn Found, MD Taking Active   ENTRESTO 49-51 MG [id number] Yes TAKE 1 TABLET BY MOUTH TWICE A DAY [name], Gwenlyn Found, MD Taking Active   ferrous sulfate 325 (65 FE) MG tablet [id number] Yes Take 325 mg by mouth every other day. [provider] Taking Active Self  finasteride (PROSCAR) 5 MG tablet [id number] Yes Take 5 mg by mouth daily. [provider] Taking Active Self  FLUAD QUADRIVALENT 0.5 ML injection [id number] Yes  [provider] Taking Active   Ginkgo Biloba 40 MG TABS [id number] Yes Take 1 tablet by mouth daily.  [provider] Taking Active Self  glucosamine-chondroitin 500-400 MG tablet [id number] Yes Take 1 tablet by mouth daily. [provider] Taking Active   glucose blood (ONETOUCH ULTRA) test strip [id number] Yes Check blood sugars once daily [name], Gwenlyn Found, MD Taking Active   latanoprost (XALATAN) 0.005 % ophthalmic solution [id number] Yes Place 1 drop into both eyes at bedtime.  [provider] Taking Active Self  metFORMIN (GLUCOPHAGE) 1000 MG tablet [id number] Yes TAKE 1 TABLET (1,000 MG TOTAL) BY MOUTH TWICE A DAY WITH FOOD  Patient taking differently: Take 500 mg by mouth in the morning and at bedtime.   [name], Gwenlyn Found, MD Taking Active   Multiple Vitamin (MULTIVITAMIN WITH MINERALS) TABS [id number] Yes Take 1 tablet by mouth daily.  [provider] Taking Active Self  Semaglutide,0.25 or 0.5MG /DOS, (OZEMPIC, 0.25 OR 0.5 MG/DOSE,) 2 MG/3ML SOPN [id number] Yes Inject 0.25 mg into the skin once a week. [name], Gwenlyn Found, MD Taking Active   tamsulosin (FLOMAX) 0.4 MG CAPS capsule [id number] Yes Take 0.4 mg by mouth at bedtime. [provider] Taking Active             Patient Active Problem List   Diagnosis Date Noted   Chronic kidney disease (CKD), active medical management without dialysis 10/09/2019   Iron deficiency anemia due to chronic blood loss 10/02/2019   Chronic systolic (congestive) heart failure (HCC) 01/04/2018   Pacemaker 12/08/2015   Mobitz type 2 second degree heart block 08/08/2015   Right bundle branch block 12/16/2014   Syncope 12/16/2014   Proteinuria due to type 2 diabetes mellitus (HCC) 11/12/2014   Encounter for therapeutic drug monitoring 03/13/2013   Multiple myeloma (HCC) 02/15/2013   Edema 01/07/2013   Diabetes mellitus due to underlying condition with other diabetic kidney complication (HCC)    Hyperlipidemia    Hypertension    MGUS (monoclonal gammopathy of unknown significance)  GERD (gastroesophageal reflux disease)     Assessment / Plan:  Medication Assistance:   Approved for cardiomyopathy funds thru [facility] - available thru 06/17/2024 ;  Amount of grant $10,000  Type 2 DM - Last A1c not at goal of < 7.0%  Continue Ozempic 0.25mg  weekly. Continue to check blood glucose 1 or 2 times per daily.  Continue with current dietary changes to lower blood glucose and lipids.   Hypertension:  Continue current blood pressure lowering meds. Entresto and carvedilol Continue to check blood pressure at least once a day.   HFrEF and CKD 3b -  Continue Jardiance, Entresto and carvedilol Continue to follow up with nephrology and cardiology  Hyperlipidemia: goal LDL < 70- patient has had LDL of 70 in past but most recent value was 81 Continue atorvastatin 20mg  daily Continue with recent diet changes to limit cholesterol and dietary fat intake. Consider coronary calcium evaluation - patient is waiting on cardiology recommendation. Could consider  LDL goal of < 55.   Henrene Pastor, PharmD Clinical Pharmacist [facility]  - [facility] [phone]

## 2023-07-12 NOTE — Telephone Encounter (Signed)
Report results sent to the pt from Dr Patsy Lager.

## 2023-07-16 ENCOUNTER — Encounter: Payer: Self-pay | Admitting: Family Medicine

## 2023-07-19 NOTE — Progress Notes (Signed)
Remote pacemaker transmission.   

## 2023-08-13 ENCOUNTER — Other Ambulatory Visit: Payer: Self-pay | Admitting: Family Medicine

## 2023-08-13 DIAGNOSIS — I5022 Chronic systolic (congestive) heart failure: Secondary | ICD-10-CM

## 2023-08-13 DIAGNOSIS — I10 Essential (primary) hypertension: Secondary | ICD-10-CM

## 2023-08-20 NOTE — Patient Instructions (Incomplete)
It was great to see you again today I will be in touch with your A1c Please do get your feet taken care of soon- your nails need to be cut  Please see me in about 4-6 months assuming all is well

## 2023-08-20 NOTE — Progress Notes (Unsigned)
[facility] [address] [place], [place] [address] [phone]  Date:  08/22/2023   Name:  Michael Vanvalkenburgh Sr.   DOB:  04/09/1946   MRN:  [id number]  PCP:  Pearline Cables, MD    Chief Complaint: No chief complaint on file.   History of Present Illness:  Michael Rumbley Sr. is a 77 y.o. very pleasant male patient who presents with the following:  Seen today for diabetes follow-up Most recent visit with myself was in June of this year History of diabetes, proteinuria, chronic kidney disease, CHF, hypertension, Mobitz 2 heart block with pacemaker, hyperlipidemia, MGUS/multiple myeloma and iron deficiency anemia    He tends to be quite physically active, he works physically and also walks on his treadmill Urology- Dr Mena Goes - is monitoring him for BPH and elevated PSA He is using finasteride and tamsulosin and notes improvement of LUTS  We decreased metformin over the summer due to reduced kidney function-he continued to take [place] Pepper We had him started on Trulicity in June due to A1c 9.2%  Foot exam is due today Eye exam Recommend flu shot, COVID-19 booster Shingles vaccine  Lab Results  Component Value Date   HGBA1C 9.2 (H) 05/23/2023    Patient Active Problem List   Diagnosis Date Noted   Chronic kidney disease (CKD), active medical management without dialysis 10/09/2019   Iron deficiency anemia due to chronic blood loss 10/02/2019   Chronic systolic (congestive) heart failure (HCC) 01/04/2018   Pacemaker 12/08/2015   Mobitz type 2 second degree heart block 08/08/2015   Right bundle branch block 12/16/2014   Syncope 12/16/2014   Proteinuria due to type 2 diabetes mellitus (HCC) 11/12/2014   Encounter for therapeutic drug monitoring 03/13/2013   Multiple myeloma (HCC) 02/15/2013   Edema 01/07/2013   Diabetes mellitus due to underlying condition with other diabetic kidney complication (HCC)     Hyperlipidemia    Hypertension    MGUS (monoclonal gammopathy of unknown significance)    GERD (gastroesophageal reflux disease)     Past Medical History:  Diagnosis Date   Anemia    Cataract    Chronic systolic (congestive) heart failure (HCC)    Diabetes mellitus    GERD (gastroesophageal reflux disease)    Hyperlipidemia    Hypertension    Iron deficiency anemia due to chronic blood loss 10/02/2019   MGUS (monoclonal gammopathy of unknown significance)    Multiple myeloma (HCC) 02/2013   normal cytogenetics and FISH panel on 03/11/2013.    Osteoarthritis    Presence of permanent cardiac pacemaker    Right bundle branch block    Syncope     Past Surgical History:  Procedure Laterality Date   BIV UPGRADE  01/04/2018   BIV UPGRADE N/A 01/04/2018   Procedure: BIVP UPGRADE;  Surgeon: Marinus Maw, MD;  Location: [facility] INVASIVE CV LAB;  Service: Cardiovascular;  Laterality: N/A;   EP IMPLANTABLE DEVICE N/A 08/09/2015   Procedure: Pacemaker Implant;  Surgeon: Marinus Maw, MD;  Location: [facility] INVASIVE CV LAB;  Service: Cardiovascular;  Laterality: N/A;   EYE SURGERY      Social History   Tobacco Use   Smoking status: Former    Current packs/day: 0.00    Average packs/day: 2.0 packs/day for 35.0 years (70.1 ttl pk-yrs)    Types: Cigarettes    Start date: 03/26/1963    Quit date: 12/18/1997    Years since quitting:  25.6   Smokeless tobacco: Never   Tobacco comments:    quit 15 years  Vaping Use   Vaping status: Never Used  Substance Use Topics   Alcohol use: No    Alcohol/week: 0.0 standard drinks of alcohol   Drug use: No    Family History  Problem Relation Age of Onset   Hypertension Mother    Cancer Mother    Stroke Mother    Hypertension Sister    Hypertension Brother    Hypertension Maternal Grandmother     Allergies  Allergen Reactions   Amlodipine Swelling   Crestor [Rosuvastatin] Other (See Comments)    Per pt unable to focus    Medication list has  been reviewed and updated.  Current Outpatient Medications on File Prior to Visit  Medication Sig Dispense Refill   albuterol (VENTOLIN HFA) 108 (90 Base) MCG/ACT inhaler TAKE 2 PUFFS BY MOUTH EVERY 6 HOURS AS NEEDED FOR WHEEZE OR SHORTNESS OF BREATH 8.5 each 1   atorvastatin (LIPITOR) 20 MG tablet Take 1 tablet (20 mg total) by mouth daily. 90 tablet 3   carvedilol (COREG) 25 MG tablet TAKE 1 AND 1/2 TABLETS (37.5 MG TOTAL) BY MOUTH 2 (TWO) TIMES DAILY WITH A MEAL. 135 tablet 3   empagliflozin (JARDIANCE) 25 MG TABS tablet Take 1 tablet (25 mg total) by mouth daily before breakfast. 90 tablet 3   ferrous sulfate 325 (65 FE) MG tablet Take 325 mg by mouth every other day.     finasteride (PROSCAR) 5 MG tablet Take 5 mg by mouth daily.     FLUAD QUADRIVALENT 0.5 ML injection      Ginkgo Biloba 40 MG TABS Take 1 tablet by mouth daily.      glucosamine-chondroitin 500-400 MG tablet Take 1 tablet by mouth daily.     glucose blood (ONETOUCH ULTRA) test strip Check blood sugars once daily 100 strip 12   latanoprost (XALATAN) 0.005 % ophthalmic solution Place 1 drop into both eyes at bedtime.      metFORMIN (GLUCOPHAGE) 1000 MG tablet TAKE 1 TABLET (1,000 MG TOTAL) BY MOUTH TWICE A DAY WITH FOOD (Patient taking differently: Take 500 mg by mouth in the morning and at bedtime.) 180 tablet 0   Multiple Vitamin (MULTIVITAMIN WITH MINERALS) TABS Take 1 tablet by mouth daily.      sacubitril-valsartan (ENTRESTO) 49-51 MG Take 1 tablet by mouth 2 (two) times daily. 180 tablet 0   Semaglutide,0.25 or 0.5MG /DOS, (OZEMPIC, 0.25 OR 0.5 MG/DOSE,) 2 MG/3ML SOPN Inject 0.25 mg into the skin once a week. 3 mL 0   tamsulosin (FLOMAX) 0.4 MG CAPS capsule Take 0.4 mg by mouth at bedtime.     No current facility-administered medications on file prior to visit.    Review of Systems:  As per HPI- otherwise negative.   Physical Examination: There were no vitals filed for this visit. There were no vitals filed for  this visit. There is no height or weight on file to calculate BMI. Ideal Body Weight:    GEN: no acute distress. HEENT: Atraumatic, Normocephalic.  Ears and Nose: No external deformity. CV: RRR, No M/G/R. No JVD. No thrill. No extra heart sounds. PULM: CTA B, no wheezes, crackles, rhonchi. No retractions. No resp. distress. No accessory muscle use. ABD: S, NT, ND, +BS. No rebound. No HSM. EXTR: No c/c/e PSYCH: Normally interactive. Conversant.  Foot exam  Assessment and Plan: ***  Signed Abbe Amsterdam, MD

## 2023-08-21 ENCOUNTER — Encounter: Payer: Self-pay | Admitting: Pharmacist

## 2023-08-22 ENCOUNTER — Other Ambulatory Visit: Payer: Self-pay | Admitting: Family Medicine

## 2023-08-22 ENCOUNTER — Ambulatory Visit (INDEPENDENT_AMBULATORY_CARE_PROVIDER_SITE_OTHER): Payer: Medicare Other | Admitting: Family Medicine

## 2023-08-22 ENCOUNTER — Encounter: Payer: Self-pay | Admitting: Family Medicine

## 2023-08-22 VITALS — BP 130/80 | HR 72 | Temp 97.8°F | Resp 18 | Ht 70.0 in | Wt 203.8 lb

## 2023-08-22 DIAGNOSIS — D472 Monoclonal gammopathy: Secondary | ICD-10-CM | POA: Diagnosis not present

## 2023-08-22 DIAGNOSIS — Z23 Encounter for immunization: Secondary | ICD-10-CM | POA: Diagnosis not present

## 2023-08-22 DIAGNOSIS — N189 Chronic kidney disease, unspecified: Secondary | ICD-10-CM | POA: Diagnosis not present

## 2023-08-22 DIAGNOSIS — E785 Hyperlipidemia, unspecified: Secondary | ICD-10-CM

## 2023-08-22 DIAGNOSIS — Z7985 Long-term (current) use of injectable non-insulin antidiabetic drugs: Secondary | ICD-10-CM

## 2023-08-22 DIAGNOSIS — E1122 Type 2 diabetes mellitus with diabetic chronic kidney disease: Secondary | ICD-10-CM

## 2023-08-22 DIAGNOSIS — I10 Essential (primary) hypertension: Secondary | ICD-10-CM | POA: Diagnosis not present

## 2023-08-22 LAB — BASIC METABOLIC PANEL
BUN: 29 mg/dL — ABNORMAL HIGH (ref 6–23)
CO2: 25 meq/L (ref 19–32)
Calcium: 9.3 mg/dL (ref 8.4–10.5)
Chloride: 103 meq/L (ref 96–112)
Creatinine, Ser: 1.81 mg/dL — ABNORMAL HIGH (ref 0.40–1.50)
GFR: 35.54 mL/min — ABNORMAL LOW (ref >60.00)
Glucose, Bld: 101 mg/dL — ABNORMAL HIGH (ref 70–99)
Potassium: 4.5 meq/L (ref 3.5–5.1)
Sodium: 136 meq/L (ref 135–145)

## 2023-08-22 LAB — HEMOGLOBIN A1C: Hgb A1c MFr Bld: 7.8 % — ABNORMAL HIGH (ref 4.6–6.5)

## 2023-08-29 DIAGNOSIS — M2141 Flat foot [pes planus] (acquired), right foot: Secondary | ICD-10-CM | POA: Diagnosis not present

## 2023-08-29 DIAGNOSIS — M19072 Primary osteoarthritis, left ankle and foot: Secondary | ICD-10-CM | POA: Diagnosis not present

## 2023-08-29 DIAGNOSIS — L84 Corns and callosities: Secondary | ICD-10-CM | POA: Diagnosis not present

## 2023-08-29 DIAGNOSIS — M19071 Primary osteoarthritis, right ankle and foot: Secondary | ICD-10-CM | POA: Diagnosis not present

## 2023-08-29 DIAGNOSIS — M2142 Flat foot [pes planus] (acquired), left foot: Secondary | ICD-10-CM | POA: Diagnosis not present

## 2023-08-29 DIAGNOSIS — L602 Onychogryphosis: Secondary | ICD-10-CM | POA: Diagnosis not present

## 2023-08-29 DIAGNOSIS — B351 Tinea unguium: Secondary | ICD-10-CM | POA: Diagnosis not present

## 2023-08-29 DIAGNOSIS — E1151 Type 2 diabetes mellitus with diabetic peripheral angiopathy without gangrene: Secondary | ICD-10-CM | POA: Diagnosis not present

## 2023-08-31 ENCOUNTER — Encounter: Payer: Self-pay | Admitting: Family Medicine

## 2023-09-14 DIAGNOSIS — H34811 Central retinal vein occlusion, right eye, with macular edema: Secondary | ICD-10-CM | POA: Diagnosis not present

## 2023-09-16 ENCOUNTER — Other Ambulatory Visit: Payer: Self-pay | Admitting: Family Medicine

## 2023-09-16 DIAGNOSIS — I5022 Chronic systolic (congestive) heart failure: Secondary | ICD-10-CM

## 2023-09-16 DIAGNOSIS — I10 Essential (primary) hypertension: Secondary | ICD-10-CM

## 2023-10-04 ENCOUNTER — Ambulatory Visit (INDEPENDENT_AMBULATORY_CARE_PROVIDER_SITE_OTHER): Payer: Medicare Other

## 2023-10-04 DIAGNOSIS — I441 Atrioventricular block, second degree: Secondary | ICD-10-CM | POA: Diagnosis not present

## 2023-10-04 LAB — CUP PACEART REMOTE DEVICE CHECK
Battery Remaining Longevity: 26 mo
Battery Remaining Percentage: 30 %
Battery Voltage: 2.95 V
Brady Statistic AP VP Percent: 23 %
Brady Statistic AP VS Percent: 1 %
Brady Statistic AS VP Percent: 76 %
Brady Statistic AS VS Percent: 1 %
Brady Statistic RA Percent Paced: 22 %
Date Time Interrogation Session: 20241017043950
Implantable Lead Connection Status: 753985
Implantable Lead Connection Status: 753985
Implantable Lead Connection Status: 753985
Implantable Lead Implant Date: 20160822
Implantable Lead Implant Date: 20160822
Implantable Lead Implant Date: 20190118
Implantable Lead Location: 753858
Implantable Lead Location: 753859
Implantable Lead Location: 753860
Implantable Pulse Generator Implant Date: 20190118
Lead Channel Impedance Value: 1175 Ohm
Lead Channel Impedance Value: 450 Ohm
Lead Channel Impedance Value: 540 Ohm
Lead Channel Pacing Threshold Amplitude: 1.25 V
Lead Channel Pacing Threshold Amplitude: 1.875 V
Lead Channel Pacing Threshold Amplitude: 2 V
Lead Channel Pacing Threshold Pulse Width: 0.5 ms
Lead Channel Pacing Threshold Pulse Width: 0.5 ms
Lead Channel Pacing Threshold Pulse Width: 0.5 ms
Lead Channel Sensing Intrinsic Amplitude: 12 mV
Lead Channel Sensing Intrinsic Amplitude: 2.1 mV
Lead Channel Setting Pacing Amplitude: 2.25 V
Lead Channel Setting Pacing Amplitude: 2.875
Lead Channel Setting Pacing Amplitude: 3.5 V
Lead Channel Setting Pacing Pulse Width: 0.5 ms
Lead Channel Setting Pacing Pulse Width: 0.5 ms
Lead Channel Setting Sensing Sensitivity: 4 mV
Pulse Gen Model: 3262
Pulse Gen Serial Number: 8983138

## 2023-10-10 ENCOUNTER — Other Ambulatory Visit: Payer: Self-pay | Admitting: Family Medicine

## 2023-10-10 ENCOUNTER — Ambulatory Visit: Payer: Medicare Other

## 2023-10-10 VITALS — BP 138/80 | HR 70

## 2023-10-10 DIAGNOSIS — E785 Hyperlipidemia, unspecified: Secondary | ICD-10-CM | POA: Diagnosis not present

## 2023-10-10 DIAGNOSIS — I10 Essential (primary) hypertension: Secondary | ICD-10-CM | POA: Diagnosis not present

## 2023-10-10 DIAGNOSIS — Z7985 Long-term (current) use of injectable non-insulin antidiabetic drugs: Secondary | ICD-10-CM

## 2023-10-10 DIAGNOSIS — N189 Chronic kidney disease, unspecified: Secondary | ICD-10-CM

## 2023-10-10 DIAGNOSIS — E1122 Type 2 diabetes mellitus with diabetic chronic kidney disease: Secondary | ICD-10-CM | POA: Diagnosis not present

## 2023-10-10 MED ORDER — OZEMPIC (0.25 OR 0.5 MG/DOSE) 2 MG/3ML ~~LOC~~ SOPN: 0.5000 mg | PEN_INJECTOR | SUBCUTANEOUS | Status: DC

## 2023-10-10 NOTE — Progress Notes (Unsigned)
Pharmacy Note  10/10/2023 Name: Michael Thiesen Sr. MRN: [id number] DOB: 06/04/1946  Subjective: Michael Boldon Sr. is a 77 y.o. year old male who is a primary care patient of [name], Gwenlyn Found, MD. Clinical Pharmacist Practitioner referral was placed to assist with medication, CHF and diabetes management.     Engaged with patient face to face for  follow up medication access, diabetes and hypertension / CHF     Type 2 DM -  Current therapy - Jardiance 25mg  daily and Ozempic 0.5mg  weekly.  Past Medications tried: glipizide stopped due to low blood glucose in the afternoon; Trulicity - stopped due to cost. (> $200 in coverage gap); Due to increase in creatinine, Dr Patsy Lager stopped metformin in September 2024.   Denies nausea since starting Ozempic. S  Weight has been stable 199 to 202 lbs.   Home blood glucose: 95; 140, 113, 115, 122 Prior to starting Trulicity or Ozempic blood glucose was in 160's.  Diet: Continue to limit intake of fried foods and fatty meats like sausage or bacon.  Continues to use egg beaters in place of regular eggs.  Eats 2 meals a day and 1 or 2 snacks.   Hypertension:  Current medication: carvedilol 37.5mg  twice a day, Entresto 49-51mg  twice a day.  Checks blood pressure at home twice a day. Recent home blood pressure readings: did not have record with him today but report SBP usually 120 to 140 and DBP 70 to 80  Patient denies - Dissiness, chest pain or shortness of breath.   HFrEF and CKD 3b:  Last visit with nephrology / [facility] was 06/2023  ECHO 10/25/2017 showed EF of 30% Current therapy: Entresto 49-51mg  twice a day; Jardiance 25mg  daily, carvedilol 37.5mg  twice a day  Lab Results  Component Value Date   CREATININE 1.81 (H) 08/22/2023   Due to increase in creatinine, Dr Patsy Lager stopped metformin in September 2024.   Hyperlipidemia:  Taking atorvastatin 20mg  daily.  Last LDL was 81 (previously was 70) Past CT showed coronary artery  atherosclerosis.   See above for recent dietary changes.    Objective: Review of patient status, including review of consultants reports, laboratory and other test data, was performed as part of comprehensive evaluation and provision of chronic care management services.   Lab Results  Component Value Date   CREATININE 1.81 (H) 08/22/2023   CREATININE 1.65 (H) 06/06/2023   CREATININE 1.62 (H) 05/23/2023    Lab Results  Component Value Date   HGBA1C 7.8 (H) 08/22/2023       Component Value Date/Time   CHOL 129 12/20/2022 0949   CHOL 151 12/26/2017 0847   TRIG 71.0 12/20/2022 0949   HDL 33.40 (L) 12/20/2022 0949   HDL 34 (L) 12/26/2017 0847   CHOLHDL 4 12/20/2022 0949   VLDL 14.2 12/20/2022 0949   LDLCALC 81 12/20/2022 0949   LDLCALC 104 (H) 12/26/2017 0847     Clinical ASCVD: Yes  The ASCVD Risk score (Arnett DK, et al., 2019) failed to calculate for the following reasons:   The valid total cholesterol range is 130 to 320 mg/dL    BP Readings from Last 3 Encounters:  10/10/23 138/80  08/22/23 130/80  05/23/23 (!) 150/95     Allergies  Allergen Reactions   Amlodipine Swelling   Crestor [Rosuvastatin] Other (See Comments)    Per pt unable to focus    Medications Reviewed Today     Reviewed by Henrene Pastor, RPH-CPP (Pharmacist) on 10/10/23 at  1011  Med List Status: <None>   Medication Order Taking? Sig Documenting Provider Last Dose Status Informant  albuterol (VENTOLIN HFA) 108 (90 Base) MCG/ACT inhaler [id number] Yes TAKE 2 PUFFS BY MOUTH EVERY 6 HOURS AS NEEDED FOR WHEEZE OR SHORTNESS OF BREATH [name], Gwenlyn Found, MD Taking Active   atorvastatin (LIPITOR) 20 MG tablet [id number] Yes Take 1 tablet (20 mg total) by mouth daily. Marinus Maw, MD Taking Active   carvedilol (COREG) 25 MG tablet [id number] Yes TAKE 1 AND 1/2 TABLETS (37.5 MG TOTAL) BY MOUTH 2 (TWO) TIMES DAILY WITH A MEAL. Marinus Maw, MD Taking Active   empagliflozin (JARDIANCE) 25 MG TABS  tablet [id number] Yes Take 1 tablet (25 mg total) by mouth daily before breakfast. [name], Gwenlyn Found, MD Taking Active   ferrous sulfate 325 (65 FE) MG tablet [id number] Yes Take 325 mg by mouth every other day. [provider] Taking Active Self  finasteride (PROSCAR) 5 MG tablet [id number] Yes Take 5 mg by mouth daily. [provider] Taking Active Self  Ginkgo Biloba 40 MG TABS [id number] Yes Take 1 tablet by mouth daily.  [provider] Taking Active Self  glucosamine-chondroitin 500-400 MG tablet [id number] Yes Take 1 tablet by mouth daily. [provider] Taking Active   glucose blood (ONETOUCH ULTRA) test strip [id number] Yes Check blood sugars once daily [name], Gwenlyn Found, MD Taking Active   latanoprost (XALATAN) 0.005 % ophthalmic solution [id number] Yes Place 1 drop into both eyes at bedtime.  [provider] Taking Active Self  Multiple Vitamin (MULTIVITAMIN WITH MINERALS) TABS [id number] Yes Take 1 tablet by mouth daily.  [provider] Taking Active Self  sacubitril-valsartan (ENTRESTO) 49-51 MG [id number] Yes Take 1 tablet by mouth 2 (two) times daily. [name], Gwenlyn Found, MD Taking Active   Semaglutide,0.25 or 0.5MG /DOS, (OZEMPIC, 0.25 OR 0.5 MG/DOSE,) 2 MG/3ML SOPN [id number] Yes Inject 0.25 mg into the skin once a week. [name], Gwenlyn Found, MD Taking Active            Med Note Clydie Braun, Cindie Laroche Jul 12, 2023 11:13 AM) Getting from [employer] medication assistance program - thru 12/18/2023  tamsulosin (FLOMAX) 0.4 MG CAPS capsule [id number] Yes Take 0.4 mg by mouth at bedtime. [provider] Taking Active             Patient Active Problem List   Diagnosis Date Noted   Chronic kidney disease (CKD), active medical management without dialysis 10/09/2019   Iron deficiency anemia due to chronic blood loss 10/02/2019   Chronic systolic (congestive) heart failure (HCC) 01/04/2018   Pacemaker 12/08/2015   Mobitz type 2 second  degree heart block 08/08/2015   Right bundle branch block 12/16/2014   Syncope 12/16/2014   Proteinuria due to type 2 diabetes mellitus (HCC) 11/12/2014   Encounter for therapeutic drug monitoring 03/13/2013   Multiple myeloma (HCC) 02/15/2013   Edema 01/07/2013   Diabetes mellitus due to underlying condition with other diabetic kidney complication (HCC)    Hyperlipidemia    Hypertension    MGUS (monoclonal gammopathy of unknown significance)    GERD (gastroesophageal reflux disease)     Assessment / Plan:  Medication Assistance:   Approved for cardiomyopathy funds thru [employer] - available thru 06/17/2024 ;  Amount of grant $10,000; Approved for [employer] medication assistance program for Ozempic thru 12/18/2023 Provided 2025 application today (patient prefers to complete paper application).  Discussed potential medication costs for 2025 if he keeps current  [facility] plan. Estimate patient would reach $2000 out of pocket spend around April 2025, then med costs would be $0  Type 2 DM - Last A1c not at goal of < 7.0%  Continue Ozempic 0.5mg  weekly and Jardiance  Continue to check blood glucose 1 or 2 times per daily.  Continue with current dietary changes to lower blood glucose and lipids.   Hypertension:  Continue current blood pressure lowering meds. Entresto and carvedilol Continue to check blood pressure at least once a day.   HFrEF and CKD 3b -  Continue Jardiance, Entresto and carvedilol Continue to follow up with nephrology and cardiology  Hyperlipidemia: goal LDL < 70- patient has had LDL of 70 in past but most recent value was 81 Continue atorvastatin 20mg  daily Continue with recent diet changes to limit cholesterol and dietary fat intake. Consider coronary calcium evaluation - patient is waiting on cardiology recommendation. Could consider LDL goal of < 55.   Henrene Pastor, PharmD Clinical Pharmacist [facility]  - [facility] [phone]

## 2023-10-22 ENCOUNTER — Telehealth: Payer: Self-pay | Admitting: Family Medicine

## 2023-10-22 NOTE — Telephone Encounter (Signed)
Pt dropped off papers. Pt stated he wasn't sure what the [employer] paper is about they got it in the mail.Pls advise pt on what it is. Papers placed in your box.

## 2023-10-23 DIAGNOSIS — H401131 Primary open-angle glaucoma, bilateral, mild stage: Secondary | ICD-10-CM | POA: Diagnosis not present

## 2023-10-23 NOTE — Progress Notes (Signed)
Remote pacemaker transmission.   

## 2023-10-25 ENCOUNTER — Encounter: Payer: Self-pay | Admitting: Family Medicine

## 2023-10-25 ENCOUNTER — Other Ambulatory Visit: Payer: Medicare Other | Admitting: Pharmacist

## 2023-10-25 ENCOUNTER — Encounter: Payer: Self-pay | Admitting: Pharmacist

## 2023-10-25 NOTE — Telephone Encounter (Signed)
Spoke with patient - see telephone visit notes for more information regarding patient assistance program for Novo and [employer].

## 2023-10-25 NOTE — Progress Notes (Signed)
Pharmacy Note  10/25/2023 Name: Craig Ionescu Sr. MRN: [id number] DOB: [birth year]  Subjective: Ether Wolters Sr. is a 77 y.o. year old male who is a primary care patient of [name], Gwenlyn Found, MD. Clinical Pharmacist Practitioner referral was placed to assist with medication, CHF and diabetes management.     Engaged with patient by telephone for  follow up medication access, diabetes and hypertension / CHF     Type 2 DM -  Current therapy - Jardiance 25mg  daily and Ozempic 0.5mg  weekly.  Past Medications tried: glipizide stopped due to low blood glucose in the afternoon; Trulicity - stopped due to cost. (> $200 in coverage gap); Due to increase in creatinine, Dr Patsy Lager stopped metformin in September 2024.   Today patient report he has been having more constipation which he feels is related to ozempic. Occasional nausea but does not limit activity.  He reports his weight at home today was 191lbs - has decreased about 8 lbs in the last few months  Estimated body mass index is 27.41 kg/m as calculated from the following:   Height as of this encounter: 5\' 10"  (1.778 m).   Weight as of this encounter: 191 lb (86.6 kg).   Wt Readings from Last 3 Encounters:  08/22/23 203 lb 12.8 oz (92.4 kg)  05/23/23 208 lb 3.2 oz (94.4 kg)  02/01/23 206 lb (93.4 kg)    Home blood glucose: 95; 140, 113, 115, 122 Prior to starting Trulicity or Ozempic blood glucose was in 160's.  Diet (reviewed at previous visit) Continue to limit intake of fried foods and fatty meats like sausage or bacon.  Continues to use egg beaters in place of regular eggs.  Eats 2 meals a day and 1 or 2 snacks.   HFrEF and CKD 3b:  Last visit with nephrology / [facility] was 06/2023  ECHO 10/25/2017 showed EF of 30% Current therapy: Entresto 49-51mg  twice a day; Jardiance 25mg  daily, carvedilol 37.5mg  twice a day  Lab Results  Component Value Date   CREATININE 1.81 (H) 08/22/2023   Due to increase in creatinine,  Dr Patsy Lager stopped metformin September 2024.   Medication Access:  Patient brought in letter from [employer] which is a company that provided financial assistance for medications based on disease state. He was not sure what this was for. Upon further questioning it appears he get assistance form [employer] for his injection he gets in Dr Gerhard Perches office for macular degeneration.   Objective: Review of patient status, including review of consultants reports, laboratory and other test data, was performed as part of comprehensive evaluation and provision of chronic care management services.   Lab Results  Component Value Date   CREATININE 1.81 (H) 08/22/2023   CREATININE 1.65 (H) 06/06/2023   CREATININE 1.62 (H) 05/23/2023    Lab Results  Component Value Date   HGBA1C 7.8 (H) 08/22/2023       Component Value Date/Time   CHOL 129 12/20/2022 0949   CHOL 151 12/26/2017 0847   TRIG 71.0 12/20/2022 0949   HDL 33.40 (L) 12/20/2022 0949   HDL 34 (L) 12/26/2017 0847   CHOLHDL 4 12/20/2022 0949   VLDL 14.2 12/20/2022 0949   LDLCALC 81 12/20/2022 0949   LDLCALC 104 (H) 12/26/2017 0847     Clinical ASCVD: Yes  The ASCVD Risk score (Arnett DK, et al., 2019) failed to calculate for the following reasons:   The valid total cholesterol range is 130 to 320 mg/dL  BP Readings from Last 3 Encounters:  10/10/23 138/80  08/22/23 130/80  05/23/23 (!) 150/95    Allergies  Allergen Reactions   Amlodipine Swelling   Crestor [Rosuvastatin] Other (See Comments)    Per pt unable to focus    Medications Reviewed Today     Reviewed by Henrene Pastor, RPH-CPP (Pharmacist) on 10/25/23 at 1414  Med List Status: <None>   Medication Order Taking? Sig Documenting Provider Last Dose Status Informant  albuterol (VENTOLIN HFA) 108 (90 Base) MCG/ACT inhaler [id number]  TAKE 2 PUFFS BY MOUTH EVERY 6 HOURS AS NEEDED FOR WHEEZE OR SHORTNESS OF BREATH [name], Gwenlyn Found, MD  Active   atorvastatin (LIPITOR)  20 MG tablet [id number] Yes Take 1 tablet (20 mg total) by mouth daily. Marinus Maw, MD Taking Active   carvedilol (COREG) 25 MG tablet [id number] Yes TAKE 1 AND 1/2 TABLETS (37.5 MG TOTAL) BY MOUTH 2 (TWO) TIMES DAILY WITH A MEAL. Marinus Maw, MD Taking Active   empagliflozin (JARDIANCE) 25 MG TABS tablet [id number] Yes Take 1 tablet (25 mg total) by mouth daily before breakfast. [name], Gwenlyn Found, MD Taking Active   ferrous sulfate 325 (65 FE) MG tablet [id number] Yes Take 325 mg by mouth every other day. [provider] Taking Active Self  finasteride (PROSCAR) 5 MG tablet [id number] Yes Take 5 mg by mouth daily. [provider] Taking Active Self  Ginkgo Biloba 40 MG TABS [id number] Yes Take 1 tablet by mouth daily.  [provider] Taking Active Self  glucosamine-chondroitin 500-400 MG tablet [id number] Yes Take 1 tablet by mouth daily. [provider] Taking Active   glucose blood (ONETOUCH ULTRA) test strip [id number] Yes Check blood sugars once daily [name], Gwenlyn Found, MD Taking Active   latanoprost (XALATAN) 0.005 % ophthalmic solution [id number] Yes Place 1 drop into both eyes at bedtime.  [provider] Taking Active Self  Multiple Vitamin (MULTIVITAMIN WITH MINERALS) TABS [id number] Yes Take 1 tablet by mouth daily.  [provider] Taking Active Self  sacubitril-valsartan (ENTRESTO) 49-51 MG [id number] Yes Take 1 tablet by mouth 2 (two) times daily. [name], Gwenlyn Found, MD Taking Active   Semaglutide,0.25 or 0.5MG /DOS, (OZEMPIC, 0.25 OR 0.5 MG/DOSE,) 2 MG/3ML SOPN [id number] Yes Inject 0.5 mg into the skin once a week. [name], Gwenlyn Found, MD Taking Active   tamsulosin (FLOMAX) 0.4 MG CAPS capsule [id number] Yes Take 0.4 mg by mouth at bedtime. [provider] Taking Active             Patient Active Problem List   Diagnosis Date Noted   Chronic kidney disease (CKD), active medical management without dialysis 10/09/2019    Iron deficiency anemia due to chronic blood loss 10/02/2019   Chronic systolic (congestive) heart failure (HCC) 01/04/2018   Pacemaker 12/08/2015   Mobitz type 2 second degree heart block 08/08/2015   Right bundle branch block 12/16/2014   Syncope 12/16/2014   Proteinuria due to type 2 diabetes mellitus (HCC) 11/12/2014   Encounter for therapeutic drug monitoring 03/13/2013   Multiple myeloma (HCC) 02/15/2013   Edema 01/07/2013   Diabetes mellitus due to underlying condition with other diabetic kidney complication (HCC)    Hyperlipidemia    Hypertension    MGUS (monoclonal gammopathy of unknown significance)    GERD (gastroesophageal reflux disease)     Assessment / Plan:  Medication Assistance:   Approved for cardiomyopathy funds thru [employer] - available thru 06/17/2024 ;  Amount of grant $10,000;  Approved for  [employer] medication assistance program for Ozempic thru 12/18/2023. Patient has completed his portion of application and returned to office. Completed provider portion and will forward to PCP for review and signature.  Discussed potential medication costs for 2025 at last visit -  if he keeps current [facility] plan. Estimate patient would reach $2000 out of pocket spend around April 2025, then med costs would be $0 Assisted patient with re-enrolling in [employer] program for Macular Disease. Approved for $4200 thru 12/17/2024  Type 2 DM - Last A1c not at goal of < 7.0%; BMI is at 27. Patient is concerned about losing too much weight and that Ozempic is causing constipation Recommended docusate 100mg  daily or Miralax 1 scoop with beverage og choice once a day as needed for constipation.  Will consult with PCP about lowering dose of Ozempic from 0.5mg  weekly back to 0.25mg  weekly - though BMI is still > 25.  Continue  Jardiance  Continue to check blood glucose 1 or 2 times per daily.  Continue with current dietary changes to lower blood glucose and lipids.    HFrEF and CKD 3b -  Continue Jardiance, Entresto and carvedilol Continue to follow up with nephrology and cardiology   Henrene Pastor, PharmD Clinical Pharmacist [facility]  - [facility] [phone]

## 2023-11-14 DIAGNOSIS — H34811 Central retinal vein occlusion, right eye, with macular edema: Secondary | ICD-10-CM | POA: Diagnosis not present

## 2023-11-28 ENCOUNTER — Encounter: Payer: Self-pay | Admitting: Pharmacist

## 2023-11-28 DIAGNOSIS — L602 Onychogryphosis: Secondary | ICD-10-CM | POA: Diagnosis not present

## 2023-11-28 DIAGNOSIS — M2142 Flat foot [pes planus] (acquired), left foot: Secondary | ICD-10-CM | POA: Diagnosis not present

## 2023-11-28 DIAGNOSIS — E1151 Type 2 diabetes mellitus with diabetic peripheral angiopathy without gangrene: Secondary | ICD-10-CM | POA: Diagnosis not present

## 2023-11-28 DIAGNOSIS — B351 Tinea unguium: Secondary | ICD-10-CM | POA: Diagnosis not present

## 2023-11-28 DIAGNOSIS — L84 Corns and callosities: Secondary | ICD-10-CM | POA: Diagnosis not present

## 2023-11-28 DIAGNOSIS — M2141 Flat foot [pes planus] (acquired), right foot: Secondary | ICD-10-CM | POA: Diagnosis not present

## 2023-11-28 DIAGNOSIS — M19071 Primary osteoarthritis, right ankle and foot: Secondary | ICD-10-CM | POA: Diagnosis not present

## 2023-11-28 DIAGNOSIS — M19072 Primary osteoarthritis, left ankle and foot: Secondary | ICD-10-CM | POA: Diagnosis not present

## 2023-11-28 NOTE — Progress Notes (Signed)
Pharmacy Note  11/28/2023 Name: Michael Blazejewski Sr. MRN: [id number] DOB: September 27, 1946  Subjective: Michael Densford Sr. is a 77 y.o. year old male who is a primary care patient of [name], Michael Found, MD. Clinical Pharmacist Practitioner referral was placed to assist with medication, CHF and diabetes management.     Received request for additional infromatin from [employer] patient assistance program. They are requesting information about his part D plan.    Type 2 DM -  Current therapy - Jardiance 25mg  daily and Ozempic 0.5mg  weekly.   Objective: Review of patient status, including review of consultants reports, laboratory and other test data, was performed as part of comprehensive evaluation and provision of chronic care management services.   Lab Results  Component Value Date   CREATININE 1.81 (H) 08/22/2023   CREATININE 1.65 (H) 06/06/2023   CREATININE 1.62 (H) 05/23/2023    Lab Results  Component Value Date   HGBA1C 7.8 (H) 08/22/2023       Component Value Date/Time   CHOL 129 12/20/2022 0949   CHOL 151 12/26/2017 0847   TRIG 71.0 12/20/2022 0949   HDL 33.40 (L) 12/20/2022 0949   HDL 34 (L) 12/26/2017 0847   CHOLHDL 4 12/20/2022 0949   VLDL 14.2 12/20/2022 0949   LDLCALC 81 12/20/2022 0949   LDLCALC 104 (H) 12/26/2017 0847     Clinical ASCVD: Yes  The ASCVD Risk score (Arnett DK, et al., 2019) failed to calculate for the following reasons:   The valid total cholesterol range is 130 to 320 mg/dL    BP Readings from Last 3 Encounters:  10/10/23 138/80  08/22/23 130/80  05/23/23 (!) 150/95    Allergies  Allergen Reactions   Amlodipine Swelling   Crestor [Rosuvastatin] Other (See Comments)    Per pt unable to focus    Medications Reviewed Today     Reviewed by Henrene Pastor, RPH-CPP (Pharmacist) on 11/28/23 at 1023  Med List Status: <None>   Medication Order Taking? Sig Documenting Provider Last Dose Status Informant  albuterol (VENTOLIN HFA) 108 (90  Base) MCG/ACT inhaler [id number] No TAKE 2 PUFFS BY MOUTH EVERY 6 HOURS AS NEEDED FOR WHEEZE OR SHORTNESS OF BREATH [name], Michael Found, MD Taking Active   atorvastatin (LIPITOR) 20 MG tablet [id number] No Take 1 tablet (20 mg total) by mouth daily. Marinus Maw, MD Taking Active   carvedilol (COREG) 25 MG tablet [id number] No TAKE 1 AND 1/2 TABLETS (37.5 MG TOTAL) BY MOUTH 2 (TWO) TIMES DAILY WITH A MEAL. Marinus Maw, MD Taking Active   empagliflozin (JARDIANCE) 25 MG TABS tablet [id number] No Take 1 tablet (25 mg total) by mouth daily before breakfast. [name], Michael Found, MD Taking Active   ferrous sulfate 325 (65 FE) MG tablet [id number] No Take 325 mg by mouth every other day. [provider] Taking Active Self  finasteride (PROSCAR) 5 MG tablet [id number] No Take 5 mg by mouth daily. [provider] Taking Active Self  Ginkgo Biloba 40 MG TABS [id number] No Take 1 tablet by mouth daily.  [provider] Taking Active Self  glucosamine-chondroitin 500-400 MG tablet [id number] No Take 1 tablet by mouth daily. [provider] Taking Active   glucose blood (ONETOUCH ULTRA) test strip [id number] No Check blood sugars once daily [name], Michael Found, MD Taking Active   latanoprost (XALATAN) 0.005 % ophthalmic solution [id number] No Place 1 drop into both eyes at bedtime.  [provider] Taking Active Self  Multiple Vitamin (MULTIVITAMIN  WITH MINERALS) TABS [id number] No Take 1 tablet by mouth daily.  [provider] Taking Active Self  sacubitril-valsartan (ENTRESTO) 49-51 MG [id number] No Take 1 tablet by mouth 2 (two) times daily. [name], Michael Found, MD Taking Active   Semaglutide,0.25 or 0.5MG /DOS, (OZEMPIC, 0.25 OR 0.5 MG/DOSE,) 2 MG/3ML SOPN [id number] No Inject 0.5 mg into the skin once a week. [name], Michael Found, MD Taking Active   tamsulosin (FLOMAX) 0.4 MG CAPS capsule [id number] No Take 0.4 mg by mouth at bedtime. [provider] Taking  Active             Patient Active Problem List   Diagnosis Date Noted   Chronic kidney disease (CKD), active medical management without dialysis 10/09/2019   Iron deficiency anemia due to chronic blood loss 10/02/2019   Chronic systolic (congestive) heart failure (HCC) 01/04/2018   Pacemaker 12/08/2015   Mobitz type 2 second degree heart block 08/08/2015   Right bundle branch block 12/16/2014   Syncope 12/16/2014   Proteinuria due to type 2 diabetes mellitus (HCC) 11/12/2014   Encounter for therapeutic drug monitoring 03/13/2013   Multiple myeloma (HCC) 02/15/2013   Edema 01/07/2013   Diabetes mellitus due to underlying condition with other diabetic kidney complication (HCC)    Hyperlipidemia    Hypertension    MGUS (monoclonal gammopathy of unknown significance)    GERD (gastroesophageal reflux disease)     Assessment / Plan:  Medication Assistance:   Approved for cardiomyopathy funds thru [employer] - available thru 06/17/2024 ;  Amount of grant $10,000;  Approved for [employer] medication assistance program for Ozempic thru 12/18/2023. Completed form for requested insurance information for patient and sent to [employer] along with copy of the front and back of his [facility] Card.   Henrene Pastor, PharmD Clinical Pharmacist [facility]  - [facility] [phone]

## 2023-11-29 ENCOUNTER — Telehealth: Payer: Self-pay

## 2023-11-29 NOTE — Telephone Encounter (Signed)
Unsuccessful attempt to reach patient on preferred number listed in notes for scheduled AWV. Left message on voicemail okay to reschedule. 

## 2023-12-08 ENCOUNTER — Other Ambulatory Visit: Payer: Self-pay | Admitting: Family Medicine

## 2023-12-08 DIAGNOSIS — E0829 Diabetes mellitus due to underlying condition with other diabetic kidney complication: Secondary | ICD-10-CM

## 2023-12-17 ENCOUNTER — Telehealth: Payer: Self-pay | Admitting: Family Medicine

## 2023-12-17 NOTE — Telephone Encounter (Signed)
Copied from CRM [id number]. Topic: Medicare AWV >> Dec 17, 2023 11:37 AM Payton Doughty wrote: Reason for CRM: Called LVM 12/17/2023 to schedule AWV. Please schedule Virtual or Telehealth visits ONLY.   Verlee Rossetti; Care Guide Ambulatory Clinical Support [place] [facility] Direct Dial: [phone]

## 2023-12-21 DIAGNOSIS — R3121 Asymptomatic microscopic hematuria: Secondary | ICD-10-CM | POA: Diagnosis not present

## 2023-12-21 DIAGNOSIS — R3912 Poor urinary stream: Secondary | ICD-10-CM | POA: Diagnosis not present

## 2023-12-29 ENCOUNTER — Encounter: Payer: Self-pay | Admitting: Family Medicine

## 2023-12-29 ENCOUNTER — Other Ambulatory Visit: Payer: Self-pay | Admitting: Internal Medicine

## 2024-01-03 ENCOUNTER — Ambulatory Visit (INDEPENDENT_AMBULATORY_CARE_PROVIDER_SITE_OTHER): Payer: Medicare Other

## 2024-01-03 DIAGNOSIS — I441 Atrioventricular block, second degree: Secondary | ICD-10-CM | POA: Diagnosis not present

## 2024-01-04 LAB — CUP PACEART REMOTE DEVICE CHECK
Battery Remaining Longevity: 21 mo
Battery Remaining Percentage: 27 %
Battery Voltage: 2.93 V
Brady Statistic AP VP Percent: 19 %
Brady Statistic AP VS Percent: 1 %
Brady Statistic AS VP Percent: 79 %
Brady Statistic AS VS Percent: 1 %
Brady Statistic RA Percent Paced: 18 %
Date Time Interrogation Session: 20250117002154
Implantable Lead Connection Status: 753985
Implantable Lead Connection Status: 753985
Implantable Lead Connection Status: 753985
Implantable Lead Implant Date: 20160822
Implantable Lead Implant Date: 20160822
Implantable Lead Implant Date: 20190118
Implantable Lead Location: 753858
Implantable Lead Location: 753859
Implantable Lead Location: 753860
Implantable Pulse Generator Implant Date: 20190118
Lead Channel Impedance Value: 1200 Ohm
Lead Channel Impedance Value: 460 Ohm
Lead Channel Impedance Value: 550 Ohm
Lead Channel Pacing Threshold Amplitude: 1.125 V
Lead Channel Pacing Threshold Amplitude: 1.5 V
Lead Channel Pacing Threshold Amplitude: 1.625 V
Lead Channel Pacing Threshold Pulse Width: 0.5 ms
Lead Channel Pacing Threshold Pulse Width: 0.5 ms
Lead Channel Pacing Threshold Pulse Width: 0.5 ms
Lead Channel Sensing Intrinsic Amplitude: 12 mV
Lead Channel Sensing Intrinsic Amplitude: 2.3 mV
Lead Channel Setting Pacing Amplitude: 2.125
Lead Channel Setting Pacing Amplitude: 2.5 V
Lead Channel Setting Pacing Amplitude: 2.625
Lead Channel Setting Pacing Pulse Width: 0.5 ms
Lead Channel Setting Pacing Pulse Width: 0.5 ms
Lead Channel Setting Sensing Sensitivity: 4 mV
Pulse Gen Model: 3262
Pulse Gen Serial Number: 8983138

## 2024-01-07 ENCOUNTER — Encounter: Payer: Self-pay | Admitting: Internal Medicine

## 2024-01-09 ENCOUNTER — Ambulatory Visit (INDEPENDENT_AMBULATORY_CARE_PROVIDER_SITE_OTHER): Payer: Medicare Other | Admitting: Pharmacist

## 2024-01-09 DIAGNOSIS — E1122 Type 2 diabetes mellitus with diabetic chronic kidney disease: Secondary | ICD-10-CM

## 2024-01-09 DIAGNOSIS — Z7985 Long-term (current) use of injectable non-insulin antidiabetic drugs: Secondary | ICD-10-CM

## 2024-01-09 NOTE — Progress Notes (Signed)
Pharmacy Note  01/09/2024 Name: Michael Thompkins Sr. MRN: [id number] DOB: 22-May-1946  Subjective: Michael Luft Sr. is a 78 y.o. year old male who is a primary care patient of [name], Gwenlyn Found, MD. Clinical Pharmacist Practitioner referral was placed to assist with medication, CHF and diabetes management.     Medication Management:  Patient has been getting Entresto and Jardiance thru the [employer]omyopathy fund. He has not cost related to these meds at this time.  He was approved for [employer] medication assistance program for Ozempic in 2024 and we have submitted application for 2025.    Type 2 DM -  Current therapy - Jardiance 25mg  daily and Ozempic 0.5mg  weekly.  Denies hypoglycemia and signs of hyperglycemia.  Reports his weight has been stable.  He is exercising about 3 to 4 days per week- treadmill and lifting weights. He also is active around his home.    Objective: Review of patient status, including review of consultants reports, laboratory and other test data, was performed as part of comprehensive evaluation and provision of chronic care management services.   Lab Results  Component Value Date   CREATININE 1.81 (H) 08/22/2023   CREATININE 1.65 (H) 06/06/2023   CREATININE 1.62 (H) 05/23/2023    Lab Results  Component Value Date   HGBA1C 7.8 (H) 08/22/2023       Component Value Date/Time   CHOL 129 12/20/2022 0949   CHOL 151 12/26/2017 0847   TRIG 71.0 12/20/2022 0949   HDL 33.40 (L) 12/20/2022 0949   HDL 34 (L) 12/26/2017 0847   CHOLHDL 4 12/20/2022 0949   VLDL 14.2 12/20/2022 0949   LDLCALC 81 12/20/2022 0949   LDLCALC 104 (H) 12/26/2017 0847     Clinical ASCVD: Yes  The ASCVD Risk score (Arnett DK, et al., 2019) failed to calculate for the following reasons:   The valid total cholesterol range is 130 to 320 mg/dL    BP Readings from Last 3 Encounters:  10/10/23 138/80  08/22/23 130/80  05/23/23 (!) 150/95    Allergies  Allergen Reactions    Amlodipine Swelling   Crestor [Rosuvastatin] Other (See Comments)    Per pt unable to focus    Medications Reviewed Today     Reviewed by Henrene Pastor, RPH-CPP (Pharmacist) on 01/09/24 at 1518  Med List Status: <None>   Medication Order Taking? Sig Documenting Provider Last Dose Status Informant  albuterol (VENTOLIN HFA) 108 (90 Base) MCG/ACT inhaler [id number] Yes TAKE 2 PUFFS BY MOUTH EVERY 6 HOURS AS NEEDED FOR WHEEZE OR SHORTNESS OF BREATH [name], Gwenlyn Found, MD Taking Active   atorvastatin (LIPITOR) 20 MG tablet [id number] Yes Take 1 tablet (20 mg total) by mouth daily. Marinus Maw, MD Taking Active   carvedilol (COREG) 25 MG tablet [id number] Yes TAKE 1 AND 1/2 TABLETS (37.5 MG TOTAL) BY MOUTH 2 (TWO) TIMES DAILY WITH A MEAL. Marinus Maw, MD Taking Active   empagliflozin (JARDIANCE) 25 MG TABS tablet [id number] Yes Take 1 tablet (25 mg total) by mouth daily before breakfast. [name], Gwenlyn Found, MD Taking Active   ferrous sulfate 325 (65 FE) MG tablet [id number] Yes Take 325 mg by mouth every other day. [provider] Taking Active Self  finasteride (PROSCAR) 5 MG tablet [id number] Yes Take 5 mg by mouth daily. [provider] Taking Active Self  Ginkgo Biloba 40 MG TABS [id number] Yes Take 1 tablet by mouth daily.  [provider] Taking Active Self  glucosamine-chondroitin 500-400 MG tablet  [id number] Yes Take 1 tablet by mouth daily. [provider] Taking Active   glucose blood (ONETOUCH ULTRA) test strip [id number] Yes Check blood sugars once daily [name], Gwenlyn Found, MD Taking Active   latanoprost (XALATAN) 0.005 % ophthalmic solution [id number] Yes Place 1 drop into both eyes at bedtime.  [provider] Taking Active Self  Multiple Vitamin (MULTIVITAMIN WITH MINERALS) TABS [id number] Yes Take 1 tablet by mouth daily.  [provider] Taking Active Self  sacubitril-valsartan (ENTRESTO) 49-51 MG [id number] Yes Take 1 tablet by mouth 2  (two) times daily. [name], Gwenlyn Found, MD Taking Active   Semaglutide,0.25 or 0.5MG /DOS, (OZEMPIC, 0.25 OR 0.5 MG/DOSE,) 2 MG/3ML SOPN [id number] Yes Inject 0.5 mg into the skin once a week. [name], Gwenlyn Found, MD Taking Active   tamsulosin (FLOMAX) 0.4 MG CAPS capsule [id number] Yes Take 2 capsules (0.8 mg total) by mouth at bedtime. [name], Gwenlyn Found, MD Taking Active             Patient Active Problem List   Diagnosis Date Noted   Chronic kidney disease (CKD), active medical management without dialysis 10/09/2019   Iron deficiency anemia due to chronic blood loss 10/02/2019   Chronic systolic (congestive) heart failure (HCC) 01/04/2018   Pacemaker 12/08/2015   Mobitz type 2 second degree heart block 08/08/2015   Right bundle branch block 12/16/2014   Syncope 12/16/2014   Proteinuria due to type 2 diabetes mellitus (HCC) 11/12/2014   Encounter for therapeutic drug monitoring 03/13/2013   Multiple myeloma (HCC) 02/15/2013   Edema 01/07/2013   Diabetes mellitus due to underlying condition with other diabetic kidney complication (HCC)    Hyperlipidemia    Hypertension    MGUS (monoclonal gammopathy of unknown significance)    GERD (gastroesophageal reflux disease)     Assessment / Plan: Type 2 DM;  Continue current therapy - Ozempic and Jardiance  Medication Assistance:   Approved for cardiomyopathy funds thru [employer] - available thru 06/17/2024 ;  Amount of grant $10,000;  Approved for [employer] medication assistance program for Ozempic thru 12/17/2024. Patient has 3 pens of Ozempic on hand so should last until he receives first delivery from [employer]. He is to call me if office does not received when he starts to use his last Ozempic pen.  Reviewed medication list and discuss adherence.   Follow up planned in June 2025 to renew [employer].   Henrene Pastor, PharmD Clinical Pharmacist [facility]  - [facility] [phone]

## 2024-01-10 DIAGNOSIS — I129 Hypertensive chronic kidney disease with stage 1 through stage 4 chronic kidney disease, or unspecified chronic kidney disease: Secondary | ICD-10-CM | POA: Diagnosis not present

## 2024-01-10 DIAGNOSIS — E1122 Type 2 diabetes mellitus with diabetic chronic kidney disease: Secondary | ICD-10-CM | POA: Diagnosis not present

## 2024-01-10 DIAGNOSIS — I5022 Chronic systolic (congestive) heart failure: Secondary | ICD-10-CM | POA: Diagnosis not present

## 2024-01-10 DIAGNOSIS — C9000 Multiple myeloma not having achieved remission: Secondary | ICD-10-CM | POA: Diagnosis not present

## 2024-01-10 DIAGNOSIS — N1832 Chronic kidney disease, stage 3b: Secondary | ICD-10-CM | POA: Diagnosis not present

## 2024-01-15 ENCOUNTER — Telehealth: Payer: Self-pay

## 2024-01-15 NOTE — Telephone Encounter (Signed)
Called to let pt know that pt assistance Ozempic 0.25 mg is here in the office. Left vm to return call.

## 2024-01-15 NOTE — Telephone Encounter (Signed)
Noted

## 2024-01-15 NOTE — Telephone Encounter (Signed)
Copied from CRM [id number]. Topic: General - Other >> Jan 15, 2024  2:23 PM Michael Wiggins wrote: Reason for CRM: Patient states he will be in as soon as he can to pick up his Ozempic per the message from [place]

## 2024-01-24 ENCOUNTER — Ambulatory Visit (INDEPENDENT_AMBULATORY_CARE_PROVIDER_SITE_OTHER): Payer: Medicare Other | Admitting: *Deleted

## 2024-01-24 VITALS — Ht 70.0 in | Wt 199.0 lb

## 2024-01-24 DIAGNOSIS — Z0000 Encounter for general adult medical examination without abnormal findings: Secondary | ICD-10-CM

## 2024-01-24 NOTE — Patient Instructions (Signed)
 [name] , Thank you for taking time to come for your Medicare Wellness Visit. I appreciate your ongoing commitment to your health goals. Please review the following plan we discussed and let me know if I can assist you in the future.     This is a list of the screening recommended for you and due dates:  Health Maintenance  Topic Date Due   Zoster (Shingles) Vaccine (1 of 2) Never done   Eye exam for diabetics  06/13/2023   COVID-19 Vaccine (7 - 2024-25 season) 08/19/2023   Yearly kidney health urinalysis for diabetes  12/21/2023   Hemoglobin A1C  02/19/2024   Yearly kidney function blood test for diabetes  08/21/2024   Complete foot exam   08/21/2024   Medicare Annual Wellness Visit  01/23/2025   DTaP/Tdap/Td vaccine (4 - Td or Tdap) 07/07/2031   Pneumonia Vaccine  Completed   Flu Shot  Completed   Hepatitis C Screening  Completed   HPV Vaccine  Aged Out   Colon Cancer Screening  Discontinued    Next appointment: Follow up in one year for your annual wellness visit.  Preventive Care 81 Years and Older, Male Preventive care refers to lifestyle choices and visits with your health care provider that can promote health and wellness. What does preventive care include? A yearly physical exam. This is also called an annual well check. Dental exams once or twice a year. Routine eye exams. Ask your health care provider how often you should have your eyes checked. Personal lifestyle choices, including: Daily care of your teeth and gums. Regular physical activity. Eating a healthy diet. Avoiding tobacco and drug use. Limiting alcohol use. Practicing safe sex. Taking low doses of aspirin  every day. Taking vitamin and mineral supplements as recommended by your health care provider. What happens during an annual well check? The services and screenings done by your health care provider during your annual well check will depend on your age, overall health, lifestyle risk factors, and  family history of disease. Counseling  Your health care provider may ask you questions about your: Alcohol use. Tobacco use. Drug use. Emotional well-being. Home and relationship well-being. Sexual activity. Eating habits. History of falls. Memory and ability to understand (cognition). Work and work astronomer. Screening  You may have the following tests or measurements: Height, weight, and BMI. Blood pressure. Lipid and cholesterol levels. These may be checked every 5 years, or more frequently if you are over 61 years old. Skin check. Lung cancer screening. You may have this screening every year starting at age 107 if you have a 30-pack-year history of smoking and currently smoke or have quit within the past 15 years. Fecal occult blood test (FOBT) of the stool. You may have this test every year starting at age 9. Flexible sigmoidoscopy or colonoscopy. You may have a sigmoidoscopy every 5 years or a colonoscopy every 10 years starting at age 65. Prostate cancer screening. Recommendations will vary depending on your family history and other risks. Hepatitis C blood test. Hepatitis B blood test. Sexually transmitted disease (STD) testing. Diabetes screening. This is done by checking your blood sugar (glucose) after you have not eaten for a while (fasting). You may have this done every 1-3 years. Abdominal aortic aneurysm (AAA) screening. You may need this if you are a current or former smoker. Osteoporosis. You may be screened starting at age 61 if you are at high risk. Talk with your health care provider about your test results, treatment  options, and if necessary, the need for more tests. Vaccines  Your health care provider may recommend certain vaccines, such as: Influenza vaccine. This is recommended every year. Tetanus, diphtheria, and acellular pertussis (Tdap, Td) vaccine. You may need a Td booster every 10 years. Zoster vaccine. You may need this after age 43. Pneumococcal  13-valent conjugate (PCV13) vaccine. One dose is recommended after age 49. Pneumococcal polysaccharide (PPSV23) vaccine. One dose is recommended after age 4. Talk to your health care provider about which screenings and vaccines you need and how often you need them. This information is not intended to replace advice given to you by your health care provider. Make sure you discuss any questions you have with your health care provider. Document Released: 12/31/2015 Document Revised: 08/23/2016 Document Reviewed: 10/05/2015 Elsevier Interactive Patient Education  2017 [employer].  Fall Prevention in the Home Falls can cause injuries. They can happen to people of all ages. There are many things you can do to make your home safe and to help prevent falls. What can I do on the outside of my home? Regularly fix the edges of walkways and driveways and fix any cracks. Remove anything that might make you trip as you walk through a door, such as a raised step or threshold. Trim any bushes or trees on the path to your home. Use bright outdoor lighting. Clear any walking paths of anything that might make someone trip, such as rocks or tools. Regularly check to see if handrails are loose or broken. Make sure that both sides of any steps have handrails. Any raised decks and porches should have guardrails on the edges. Have any leaves, snow, or ice cleared regularly. Use sand or salt on walking paths during winter. Clean up any spills in your garage right away. This includes oil or grease spills. What can I do in the bathroom? Use night lights. Install grab bars by the toilet and in the tub and shower. Do not use towel bars as grab bars. Use non-skid mats or decals in the tub or shower. If you need to sit down in the shower, use a plastic, non-slip stool. Keep the floor dry. Clean up any water that spills on the floor as soon as it happens. Remove soap buildup in the tub or shower regularly. Attach  bath mats securely with double-sided non-slip rug tape. Do not have throw rugs and other things on the floor that can make you trip. What can I do in the bedroom? Use night lights. Make sure that you have a light by your bed that is easy to reach. Do not use any sheets or blankets that are too big for your bed. They should not hang down onto the floor. Have a firm chair that has side arms. You can use this for support while you get dressed. Do not have throw rugs and other things on the floor that can make you trip. What can I do in the kitchen? Clean up any spills right away. Avoid walking on wet floors. Keep items that you use a lot in easy-to-reach places. If you need to reach something above you, use a strong step stool that has a grab bar. Keep electrical cords out of the way. Do not use floor polish or wax that makes floors slippery. If you must use wax, use non-skid floor wax. Do not have throw rugs and other things on the floor that can make you trip. What can I do with my stairs? Do not  leave any items on the stairs. Make sure that there are handrails on both sides of the stairs and use them. Fix handrails that are broken or loose. Make sure that handrails are as long as the stairways. Check any carpeting to make sure that it is firmly attached to the stairs. Fix any carpet that is loose or worn. Avoid having throw rugs at the top or bottom of the stairs. If you do have throw rugs, attach them to the floor with carpet tape. Make sure that you have a light switch at the top of the stairs and the bottom of the stairs. If you do not have them, ask someone to add them for you. What else can I do to help prevent falls? Wear shoes that: Do not have high heels. Have rubber bottoms. Are comfortable and fit you well. Are closed at the toe. Do not wear sandals. If you use a stepladder: Make sure that it is fully opened. Do not climb a closed stepladder. Make sure that both sides of the  stepladder are locked into place. Ask someone to hold it for you, if possible. Clearly [place] and make sure that you can see: Any grab bars or handrails. First and last steps. Where the edge of each step is. Use tools that help you move around (mobility aids) if they are needed. These include: Canes. Walkers. Scooters. Crutches. Turn on the lights when you go into a dark area. Replace any light bulbs as soon as they burn out. Set up your furniture so you have a clear path. Avoid moving your furniture around. If any of your floors are uneven, fix them. If there are any pets around you, be aware of where they are. Review your medicines with your doctor. Some medicines can make you feel dizzy. This can increase your chance of falling. Ask your doctor what other things that you can do to help prevent falls. This information is not intended to replace advice given to you by your health care provider. Make sure you discuss any questions you have with your health care provider. Document Released: 09/30/2009 Document Revised: 05/11/2016 Document Reviewed: 01/08/2015 Elsevier Interactive Patient Education  2017 [employer].

## 2024-01-24 NOTE — Progress Notes (Signed)
 Subjective:   Michael Imes Sr. is a 78 y.o. male who presents for Medicare Annual/Subsequent preventive examination.  Visit Complete: Virtual I connected with  Michael Sadler Sr. on 01/24/24 by a audio enabled telemedicine application and verified that I am speaking with the correct person using two identifiers.  Patient Location: Home  Provider Location: Office/Clinic  I discussed the limitations of evaluation and management by telemedicine. The patient expressed understanding and agreed to proceed.  Vital Signs: Because this visit was a virtual/telehealth visit, some criteria may be missing or patient reported. Any vitals not documented were not able to be obtained and vitals that have been documented are patient reported.  Cardiac Risk Factors include: male gender;diabetes mellitus;dyslipidemia;hypertension;advanced age (>56men, >18 women)     Objective:    Today's Vitals   01/24/24 1400  Weight: 199 lb (90.3 kg)  Height: 5' 10 (1.778 m)   Body mass index is 28.55 kg/m.     01/24/2024    1:43 PM 06/06/2023    9:43 AM 11/22/2022    9:17 AM 06/05/2022    2:55 PM 11/21/2021   10:27 AM 10/04/2021   10:42 AM 04/04/2021   10:34 AM  Advanced Directives  Does Patient Have a Medical Advance Directive? No No No No No No No  Would patient like information on creating a medical advance directive? No - Patient declined No - Patient declined No - Patient declined  No - Patient declined No - Patient declined No - Patient declined    Current Medications (verified) Outpatient Encounter Medications as of 01/24/2024  Medication Sig   albuterol  (VENTOLIN  HFA) 108 (90 Base) MCG/ACT inhaler TAKE 2 PUFFS BY MOUTH EVERY 6 HOURS AS NEEDED FOR WHEEZE OR SHORTNESS OF BREATH   atorvastatin  (LIPITOR) 20 MG tablet Take 1 tablet (20 mg total) by mouth daily.   carvedilol  (COREG ) 25 MG tablet TAKE 1 AND 1/2 TABLETS (37.5 MG TOTAL) BY MOUTH 2 (TWO) TIMES DAILY WITH A MEAL.   empagliflozin  (JARDIANCE ) 25 MG  TABS tablet Take 1 tablet (25 mg total) by mouth daily before breakfast.   ferrous sulfate  325 (65 FE) MG tablet Take 325 mg by mouth every other day.   finasteride (PROSCAR) 5 MG tablet Take 5 mg by mouth daily.   Ginkgo Biloba 40 MG TABS Take 1 tablet by mouth daily.    glucosamine-chondroitin 500-400 MG tablet Take 1 tablet by mouth daily.   glucose blood (ONETOUCH ULTRA) test strip Check blood sugars once daily   latanoprost  (XALATAN ) 0.005 % ophthalmic solution Place 1 drop into both eyes at bedtime.    Multiple Vitamin (MULTIVITAMIN WITH MINERALS) TABS Take 1 tablet by mouth daily.    sacubitril -valsartan  (ENTRESTO ) 49-51 MG Take 1 tablet by mouth 2 (two) times daily.   Semaglutide ,0.25 or 0.5MG /DOS, (OZEMPIC , 0.25 OR 0.5 MG/DOSE,) 2 MG/3ML SOPN Inject 0.5 mg into the skin once a week.   tamsulosin (FLOMAX) 0.4 MG CAPS capsule Take 2 capsules (0.8 mg total) by mouth at bedtime.   No facility-administered encounter medications on file as of 01/24/2024.    Allergies (verified) Amlodipine  and Crestor [rosuvastatin]   History: Past Medical History:  Diagnosis Date   Anemia    Cataract    Chronic systolic (congestive) heart failure (HCC)    Diabetes mellitus    GERD (gastroesophageal reflux disease)    Hyperlipidemia    Hypertension    Iron  deficiency anemia due to chronic blood loss 10/02/2019   MGUS (monoclonal gammopathy of unknown significance)  Multiple myeloma (HCC) 02/2013   normal cytogenetics and FISH panel on 03/11/2013.    Osteoarthritis    Presence of permanent cardiac pacemaker    Right bundle branch block    Syncope    Past Surgical History:  Procedure Laterality Date   BIV UPGRADE  01/04/2018   BIV UPGRADE N/A 01/04/2018   Procedure: BIVP UPGRADE;  Surgeon: Waddell Danelle ORN, MD;  Location: [facility] INVASIVE CV LAB;  Service: Cardiovascular;  Laterality: N/A;   EP IMPLANTABLE DEVICE N/A 08/09/2015   Procedure: Pacemaker Implant;  Surgeon: Danelle ORN Waddell, MD;  Location:  [facility] INVASIVE CV LAB;  Service: Cardiovascular;  Laterality: N/A;   EYE SURGERY     Family History  Problem Relation Age of Onset   Hypertension Mother    Cancer Mother    Stroke Mother    Hypertension Sister    Hypertension Brother    Hypertension Maternal Grandmother    Social History   Socioeconomic History   Marital status: Married    Spouse name: Not on file   Number of children: 3   Years of education: Not on file   Highest education level: Associate degree: occupational, scientist, product/process development, or vocational program  Occupational History   Not on file  Tobacco Use   Smoking status: Former    Current packs/day: 0.00    Average packs/day: 2.0 packs/day for 35.0 years (70.1 ttl pk-yrs)    Types: Cigarettes    Start date: 03/26/1963    Quit date: 12/18/1997    Years since quitting: 26.1   Smokeless tobacco: Never   Tobacco comments:    quit 15 years  Vaping Use   Vaping status: Never Used  Substance and Sexual Activity   Alcohol use: No    Alcohol/week: 0.0 standard drinks of alcohol   Drug use: No   Sexual activity: Yes    Birth control/protection: None  Other Topics Concern   Not on file  Social History Narrative   Married   Building Maintenance   Social Drivers of Health   Financial Resource Strain: Low Risk  (01/24/2024)   Overall Financial Resource Strain (CARDIA)    Difficulty of Paying Living Expenses: Not hard at all  Food Insecurity: No Food Insecurity (01/24/2024)   Hunger Vital Sign    Worried About Running Out of Food in the Last Year: Never true    Ran Out of Food in the Last Year: Never true  Transportation Needs: No Transportation Needs (01/24/2024)   PRAPARE - Administrator, Civil Service (Medical): No    Lack of Transportation (Non-Medical): No  Physical Activity: Insufficiently Active (01/24/2024)   Exercise Vital Sign    Days of Exercise per Week: 4 days    Minutes of Exercise per Session: 30 min  Stress: No Stress Concern Present (01/24/2024)    [employer] of Occupational Health - Occupational Stress Questionnaire    Feeling of Stress : Only a little  Social Connections: Moderately Integrated (01/24/2024)   Social Connection and Isolation Panel [NHANES]    Frequency of Communication with Friends and Family: Once a week    Frequency of Social Gatherings with Friends and Family: Twice a week    Attends Religious Services: More than 4 times per year    Active Member of [employer] or Organizations: No    Attends Banker Meetings: Never    Marital Status: Married    Tobacco Counseling Counseling given: Not Answered Tobacco comments: quit 15 years  Clinical Intake:  Pre-visit preparation completed: Yes  Pain : No/denies pain  BMI - recorded: 28.55 Nutritional Status: BMI 25 -29 Overweight Nutritional Risks: None Diabetes: Yes CBG done?: No Did pt. bring in CBG monitor from home?: No  How often do you need to have someone help you when you read instructions, pamphlets, or other written materials from your doctor or pharmacy?: 1 - Never  Interpreter Needed?: No  Information entered by :: Michael Wiggins, Michael Wiggins   Activities of Daily Living    01/24/2024    1:45 PM  In your present state of health, do you have any difficulty performing the following activities:  Hearing? 0  Comment has tinnitis  Vision? 0  Difficulty concentrating or making decisions? 0  Walking or climbing stairs? 0  Dressing or bathing? 0  Doing errands, shopping? 0  Preparing Food and eating ? N  Using the Toilet? N  In the past six months, have you accidently leaked urine? Y  Comment very occasionally  Do you have problems with loss of bowel control? N  Managing your Medications? N  Managing your Finances? N  Housekeeping or managing your Housekeeping? N    Patient Care Team: [name], Harlene BROCKS, MD as PCP - General (Family Medicine) Waddell Danelle ORN, MD as PCP - Electrophysiology (Cardiology) Douglass Lunger, MD (Nephrology) Glendia Simmonds, OD as Consulting Physician (Optometry) Timmy, Maude SAUNDERS, MD as Consulting Physician (Oncology) Waddell Danelle ORN, MD as Consulting Physician (Cardiology)  Indicate any recent Medical Services you may have received from other than [facility] providers in the past year (date may be approximate).     Assessment:   This is a routine wellness examination for Michael Wiggins.  Hearing/Vision screen No results found.   Goals Addressed   None    Depression Screen    01/24/2024    2:00 PM 05/23/2023    8:36 AM 12/20/2022    9:27 AM 11/22/2022    9:16 AM 01/11/2022    2:05 PM 11/21/2021   10:33 AM 01/06/2021    9:48 AM  PHQ 2/9 Scores  PHQ - 2 Score 0 0 0 0 0 0 0    Fall Risk    01/24/2024    1:51 PM 05/23/2023    8:36 AM 12/20/2022    9:27 AM 11/22/2022    9:19 AM 01/11/2022    2:05 PM  Fall Risk   Falls in the past year? 1 0 0 0 0  Comment fell on ice      Number falls in past yr: 0 0 0 0 0  Injury with Fall? 1 0 0 0 0  Risk for fall due to : History of fall(s) No Fall Risks No Fall Risks Impaired vision   Follow up Falls evaluation completed Falls evaluation completed Falls evaluation completed Falls prevention discussed     MEDICARE RISK AT HOME: Medicare Risk at Home Any stairs in or around the home?: Yes If so, are there any without handrails?: No Home free of loose throw rugs in walkways, pet beds, electrical cords, etc?: No Adequate lighting in your home to reduce risk of falls?: No Life alert?: No Use of a cane, walker or w/c?: No Grab bars in the bathroom?: No Shower chair or bench in shower?: No Elevated toilet seat or a handicapped toilet?: No  TIMED UP AND GO:  Was the test performed?  No    Cognitive Function:        01/24/2024    2:01 PM  11/22/2022    9:20 AM  6CIT Screen  What Year? 0 points 0 points  What month? 0 points 0 points  What time? 0 points 0 points  Count back from 20 0 points 0 points  Months in reverse 0 points 0 points  Repeat phrase 0 points 2 points   Total Score 0 points 2 points    Immunizations Immunization History  Administered Date(s) Administered   Fluad Quad(high Dose 65+) 08/25/2022   Fluad Trivalent(High Dose 65+) 08/22/2023   Influenza Split 11/04/2012   Influenza, High Dose Seasonal PF 08/24/2016, 12/13/2017, 09/24/2019   Influenza,inj,Quad PF,6+ Mos 09/21/2014, 09/16/2015, 10/02/2018   Influenza-Unspecified 10/10/2019, 10/18/2020, 09/30/2021   PFIZER Comirnaty(Gray Top)Covid-19 Tri-Sucrose Vaccine 01/19/2020, 02/02/2020, 10/18/2020, 04/06/2021   Pfizer Covid-19 Vaccine Bivalent Booster 7yrs & up 10/05/2021, 10/17/2021   Pfizer(Comirnaty)Fall Seasonal Vaccine 12 years and older 09/08/2022   Pneumococcal Conjugate-13 06/01/2014, 12/29/2016   Pneumococcal Polysaccharide-23 05/06/2012   Td 10/17/2004, 07/06/2021   Tdap 11/21/2014    TDAP status: Up to date  Flu Vaccine status: Up to date  Pneumococcal vaccine status: Up to date  Covid-19 vaccine status: Information provided on how to obtain vaccines.   Qualifies for Shingles Vaccine? Yes   Zostavax completed No   Shingrix Completed?: No.    Education has been provided regarding the importance of this vaccine. Patient has been advised to call insurance company to determine out of pocket expense if they have not yet received this vaccine. Advised may also receive vaccine at local pharmacy or Health Dept. Verbalized acceptance and understanding.  Screening Tests Health Maintenance  Topic Date Due   Zoster Vaccines- Shingrix (1 of 2) Never done   OPHTHALMOLOGY EXAM  06/13/2023   COVID-19 Vaccine (7 - 2024-25 season) 08/19/2023   Medicare Annual Wellness (AWV)  11/23/2023   Diabetic kidney evaluation - Urine ACR  12/21/2023   HEMOGLOBIN A1C  02/19/2024   Diabetic kidney evaluation - eGFR measurement  08/21/2024   FOOT EXAM  08/21/2024   DTaP/Tdap/Td (4 - Td or Tdap) 07/07/2031   Pneumonia Vaccine 46+ Years old  Completed   INFLUENZA VACCINE  Completed    Hepatitis C Screening  Completed   HPV VACCINES  Aged Out   Colonoscopy  Discontinued    Health Maintenance  Health Maintenance Due  Topic Date Due   Zoster Vaccines- Shingrix (1 of 2) Never done   OPHTHALMOLOGY EXAM  06/13/2023   COVID-19 Vaccine (7 - 2024-25 season) 08/19/2023   Medicare Annual Wellness (AWV)  11/23/2023   Diabetic kidney evaluation - Urine ACR  12/21/2023    Colorectal cancer screening: No longer required.   Lung Cancer Screening: (Low Dose CT Chest recommended if Age 25-80 years, 20 pack-year currently smoking OR have quit w/in 15years.) does not qualify.   Additional Screening:  Hepatitis C Screening: does qualify; Completed 12/08/15  Vision Screening: Recommended annual ophthalmology exams for early detection of glaucoma and other disorders of the eye. Is the patient up to date with their annual eye exam?  Yes  Who is the provider or what is the name of the office in which the patient attends annual eye exams? [facility]. [facility], Dr. [facility] If pt is not established with a provider, would they like to be referred to a provider to establish care? No .   Dental Screening: Recommended annual dental exams for proper oral hygiene  Diabetic Foot Exam: Diabetic Foot Exam: Completed 08/22/23  Community Resource Referral / Chronic Care Management:  CRR required this visit?  No   CCM required this visit?  No     Plan:     I have personally reviewed and noted the following in the patient's chart:   Medical and social history Use of alcohol, tobacco or illicit drugs  Current medications and supplements including opioid prescriptions. Patient is not currently taking opioid prescriptions. Functional ability and status Nutritional status Physical activity Advanced directives List of other physicians Hospitalizations, surgeries, and ER visits in previous 12 months Vitals Screenings to include cognitive, depression, and  falls Referrals and appointments  In addition, I have reviewed and discussed with patient certain preventive protocols, quality metrics, and best practice recommendations. A written personalized care plan for preventive services as well as general preventive health recommendations were provided to patient.     Kandis Gauze, Michael Wiggins   01/24/2024   After Visit Summary: (MyChart) Due to this being a telephonic visit, the after visit summary with patients personalized plan was offered to patient via MyChart   Nurse Notes: None

## 2024-02-01 ENCOUNTER — Other Ambulatory Visit: Payer: Self-pay | Admitting: Internal Medicine

## 2024-02-11 ENCOUNTER — Other Ambulatory Visit: Payer: Self-pay | Admitting: Family Medicine

## 2024-02-11 ENCOUNTER — Other Ambulatory Visit: Payer: Self-pay | Admitting: Internal Medicine

## 2024-02-11 DIAGNOSIS — I10 Essential (primary) hypertension: Secondary | ICD-10-CM

## 2024-02-11 DIAGNOSIS — I5022 Chronic systolic (congestive) heart failure: Secondary | ICD-10-CM

## 2024-02-12 NOTE — Progress Notes (Signed)
 Remote pacemaker transmission.

## 2024-02-12 NOTE — Addendum Note (Signed)
 Addended by: Elease Etienne A on: 02/12/2024 10:06 AM   Modules accepted: Orders

## 2024-02-13 NOTE — Progress Notes (Deleted)
 [facility] [address] Michael Wiggins, [place] [address] [phone]  Date:  02/20/2024   Name:  Michael Rubinstein Sr.   DOB:  29-Jun-1946   MRN:  [id number]  PCP:  Michael Cables, MD    Chief Complaint: No chief complaint on file.   History of Present Illness:  Michael Enberg Sr. is a 78 y.o. very pleasant male patient who presents with the following:  Patient seen today for periodic follow-up.  Most recent visit with myself was in September He also follows with our pharmacist Michael Wiggins for assistance in managing diabetes  History of diabetes, proteinuria, chronic kidney disease, CHF, hypertension, Mobitz 2 heart block with pacemaker, hyperlipidemia, MGUS/multiple myeloma and iron deficiency anemia, primary open-angle glaucoma  He is followed by hematology, cardiology, nephrology and also urology  We have been using a GLP-1 over the last year, Michael Wiggins has been a bit concerned about losing weight. We can follow-up on A1c today  Atorvastatin 20 Carvedilol 37.5 twice daily Jardiance 25 Entresto Ferrous sulfate Finasteride and tamsulosin Ozempic 0.5 mg  Recommend COVID booster Recommend RSV and Shingrix Can update A1c  Seen by nephrology in January I believe his most recent hematology visit was in June-IgG kappa smoldering myeloma, asymptomatic.  Observation  Patient Active Problem List   Diagnosis Date Noted   Chronic kidney disease (CKD), active medical management without dialysis 10/09/2019   Iron deficiency anemia due to chronic blood loss 10/02/2019   Chronic systolic (congestive) heart failure (HCC) 01/04/2018   Pacemaker 12/08/2015   Mobitz type 2 second degree heart block 08/08/2015   Right bundle branch block 12/16/2014   Syncope 12/16/2014   Proteinuria due to type 2 diabetes mellitus (HCC) 11/12/2014   Encounter for therapeutic drug monitoring 03/13/2013   Multiple myeloma (HCC) 02/15/2013   Edema  01/07/2013   Diabetes mellitus due to underlying condition with other diabetic kidney complication (HCC)    Hyperlipidemia    Hypertension    MGUS (monoclonal gammopathy of unknown significance)    GERD (gastroesophageal reflux disease)     Past Medical History:  Diagnosis Date   Anemia    Cataract    Chronic systolic (congestive) heart failure (HCC)    Diabetes mellitus    GERD (gastroesophageal reflux disease)    Hyperlipidemia    Hypertension    Iron deficiency anemia due to chronic blood loss 10/02/2019   MGUS (monoclonal gammopathy of unknown significance)    Multiple myeloma (HCC) 02/2013   normal cytogenetics and FISH panel on 03/11/2013.    Osteoarthritis    Presence of permanent cardiac pacemaker    Right bundle branch block    Syncope     Past Surgical History:  Procedure Laterality Date   BIV UPGRADE  01/04/2018   BIV UPGRADE N/A 01/04/2018   Procedure: BIVP UPGRADE;  Surgeon: Marinus Maw, MD;  Location: [facility] INVASIVE CV LAB;  Service: Cardiovascular;  Laterality: N/A;   EP IMPLANTABLE DEVICE N/A 08/09/2015   Procedure: Pacemaker Implant;  Surgeon: Marinus Maw, MD;  Location: [facility] INVASIVE CV LAB;  Service: Cardiovascular;  Laterality: N/A;   EYE SURGERY      Social History   Tobacco Use   Smoking status: Former    Current packs/day: 0.00    Average packs/day: 2.0 packs/day for 35.0 years (70.1 ttl pk-yrs)    Types: Cigarettes    Start date: 03/26/1963    Quit date: 12/18/1997  Years since quitting: 26.1   Smokeless tobacco: Never   Tobacco comments:    quit 15 years  Vaping Use   Vaping status: Never Used  Substance Use Topics   Alcohol use: No    Alcohol/week: 0.0 standard drinks of alcohol   Drug use: No    Family History  Problem Relation Age of Onset   Hypertension Mother    Cancer Mother    Stroke Mother    Hypertension Sister    Hypertension Brother    Hypertension Maternal Grandmother     Allergies  Allergen Reactions   Amlodipine  Swelling   Crestor [Rosuvastatin] Other (See Comments)    Per pt unable to focus    Medication list has been reviewed and updated.  Current Outpatient Medications on File Prior to Visit  Medication Sig Dispense Refill   albuterol (VENTOLIN HFA) 108 (90 Base) MCG/ACT inhaler TAKE 2 PUFFS BY MOUTH EVERY 6 HOURS AS NEEDED FOR WHEEZE OR SHORTNESS OF BREATH 8.5 each 1   atorvastatin (LIPITOR) 20 MG tablet Take 1 tablet (20 mg total) by mouth daily. 90 tablet 3   carvedilol (COREG) 25 MG tablet TAKE 1 AND 1/2 TABLETS (37.5 MG TOTAL) BY MOUTH 2 (TWO) TIMES DAILY WITH A MEAL. 135 tablet 0   empagliflozin (JARDIANCE) 25 MG TABS tablet Take 1 tablet (25 mg total) by mouth daily before breakfast. 90 tablet 1   ENTRESTO 49-51 MG TAKE 1 TABLET BY MOUTH TWICE A DAY 180 tablet 1   ferrous sulfate 325 (65 FE) MG tablet Take 325 mg by mouth every other day.     finasteride (PROSCAR) 5 MG tablet Take 5 mg by mouth daily.     Ginkgo Biloba 40 MG TABS Take 1 tablet by mouth daily.      glucosamine-chondroitin 500-400 MG tablet Take 1 tablet by mouth daily.     glucose blood (ONETOUCH ULTRA) test strip Check blood sugars once daily 100 strip 12   latanoprost (XALATAN) 0.005 % ophthalmic solution Place 1 drop into both eyes at bedtime.      Multiple Vitamin (MULTIVITAMIN WITH MINERALS) TABS Take 1 tablet by mouth daily.      Semaglutide,0.25 or 0.5MG /DOS, (OZEMPIC, 0.25 OR 0.5 MG/DOSE,) 2 MG/3ML SOPN Inject 0.5 mg into the skin once a week.     tamsulosin (FLOMAX) 0.4 MG CAPS capsule Take 2 capsules (0.8 mg total) by mouth at bedtime.     No current facility-administered medications on file prior to visit.    Review of Systems:  As per HPI- otherwise negative.   Physical Examination: There were no vitals filed for this visit. There were no vitals filed for this visit. There is no height or weight on file to calculate BMI. Ideal Body Weight:    GEN: no acute distress. HEENT: Atraumatic,  Normocephalic.  Ears and Nose: No external deformity. CV: RRR, No M/G/R. No JVD. No thrill. No extra heart sounds. PULM: CTA B, no wheezes, crackles, rhonchi. No retractions. No resp. distress. No accessory muscle use. ABD: S, NT, ND, +BS. No rebound. No HSM. EXTR: No c/c/e PSYCH: Normally interactive. Conversant.    Assessment and Plan: ***  Signed Abbe Amsterdam, MD

## 2024-02-13 NOTE — Patient Instructions (Incomplete)
It was great to see you again today, I will be in touch with your lab work.  I do recommend getting a dose of RSV vaccine and the Shingrix series if not done already.

## 2024-02-16 ENCOUNTER — Encounter: Payer: Self-pay | Admitting: Family Medicine

## 2024-02-19 NOTE — Progress Notes (Signed)
 [facility] [address] [place], [place] [address] [phone]  Date:  02/25/2024   Name:  Michael Bribiesca Sr.   DOB:  06-25-1946   MRN:  [id number]  PCP:  Pearline Cables, MD    Chief Complaint: 6 m f/u (Concerns/ questions: blood sugars have been elevated since being sick/Eye: Friday Dr Caleen Jobs, [facility] Dr Peson/Urine MA, A1C due/Shingrix: due)   History of Present Illness:  Michael Greenley Sr. is a 78 y.o. very pleasant male patient who presents with the following:  Patient seen today for periodic follow-up.  Most recent visit with myself was in September He also follows with our pharmacist Henrene Pastor for assistance in managing diabetes  History of diabetes, proteinuria, chronic kidney disease, CHF, hypertension, Mobitz 2 heart block with pacemaker, hyperlipidemia, MGUS/multiple myeloma and iron deficiency anemia, primary open-angle glaucoma  He is followed by hematology, cardiology, nephrology and also urology  We have been using a GLP-1 over the last year, Michael Wiggins has been a bit concerned about losing weight.  However, he is stable today-he is trying to eat enough.  Taking Ozempic 0.5 with good results We can follow-up on A1c today  Atorvastatin 20 Carvedilol 37.5 twice daily Jardiance 25 Entresto Ferrous sulfate Finasteride and tamsulosin Ozempic 0.5 mg  Recommend COVID booster- he is UTD  Recommend RSV and Shingrix Can update A1c  Seen by nephrology in January I believe his most recent hematology visit was in June-IgG kappa smoldering myeloma, asymptomatic.  Observation  He did get sick about 2 weeks ago with body aches, cough, sneezing- no fever He checked his glucose a  couple of days ago and it was 150- 180 He did not happen for covid or flu; however his wife was recently ill and she tested negative for COVID.  Overall he feels like he is coming to the tail end of the symptoms  He was seen  by Dr Ladona Ridgel recently for his routine recheck: Assess/Plan: 1. Chronic systolic heart failure - he is asymptomatic, s/p biv ICD. 2. ICD - his St. Jude Biv ICD is working normally. 3. CHB - he is asymptomatic s/p biv device 4. Hyponatremia - he has reduced his fluid intake. We will follow.   Lab Results  Component Value Date   HGBA1C 7.8 (H) 08/22/2023   Wt Readings from Last 3 Encounters:  02/25/24 201 lb 9.6 oz (91.4 kg)  02/21/24 179 lb 7.3 oz (81.4 kg)  01/24/24 199 lb (90.3 kg)   Pt notes weight of 179 is not accurate- perhaps it was supposed to say 197  Patient Active Problem List   Diagnosis Date Noted   Chronic kidney disease (CKD), active medical management without dialysis 10/09/2019   Iron deficiency anemia due to chronic blood loss 10/02/2019   Chronic systolic (congestive) heart failure (HCC) 01/04/2018   Pacemaker 12/08/2015   Mobitz type 2 second degree heart block 08/08/2015   Right bundle branch block 12/16/2014   Syncope 12/16/2014   Proteinuria due to type 2 diabetes mellitus (HCC) 11/12/2014   Encounter for therapeutic drug monitoring 03/13/2013   Multiple myeloma (HCC) 02/15/2013   Edema 01/07/2013   Diabetes mellitus due to underlying condition with other diabetic kidney complication (HCC)    Hyperlipidemia    Hypertension    MGUS (monoclonal gammopathy of unknown significance)    GERD (gastroesophageal reflux disease)     Past Medical History:  Diagnosis Date   Anemia  Cataract    Chronic systolic (congestive) heart failure (HCC)    Diabetes mellitus    GERD (gastroesophageal reflux disease)    Hyperlipidemia    Hypertension    Iron deficiency anemia due to chronic blood loss 10/02/2019   MGUS (monoclonal gammopathy of unknown significance)    Multiple myeloma (HCC) 02/2013   normal cytogenetics and FISH panel on 03/11/2013.    Osteoarthritis    Presence of permanent cardiac pacemaker    Right bundle branch block    Syncope     Past  Surgical History:  Procedure Laterality Date   BIV UPGRADE  01/04/2018   BIV UPGRADE N/A 01/04/2018   Procedure: BIVP UPGRADE;  Surgeon: Marinus Maw, MD;  Location: [facility] INVASIVE CV LAB;  Service: Cardiovascular;  Laterality: N/A;   EP IMPLANTABLE DEVICE N/A 08/09/2015   Procedure: Pacemaker Implant;  Surgeon: Marinus Maw, MD;  [facility]: [facility] INVAS[facility] CV LAB;  Service: Cardiovascular;  Laterality: N/A;   EYE SURGERY      Social History   Tobacco Use   Smoking status: Former    Current packs/day: 0.00    Average packs/day: 2.0 packs/day for 35.0 years (70.1 ttl pk-yrs)    Types: Cigarettes    Start date: 03/26/1963    Quit date: 12/18/1997    Years since quitting: 26.2   Smokeless tobacco: Never   Tobacco comments:    quit 15 years  Vaping Use   Vaping status: Never Used  Substance Use Topics   Alcohol use: No    Alcohol/week: 0.0 standard drinks of alcohol   Drug use: No    Family History  Problem Relation Age of Onset   Hypertension Mother    Cancer Mother    Stroke Mother    Hypertension Sister    Hypertension Brother    Hypertension Maternal Grandmother     Allergies  Allergen Reactions   Amlodipine Swelling   Crestor [Rosuvastatin] Other (See Comments)    Per pt unable to focus    Medication list has been reviewed and updated.  Current Outpatient Medications on File Prior to Visit  Medication Sig Dispense Refill   carvedilol (COREG) 25 MG tablet TAKE 1 AND 1/2 TABLETS (37.5 MG TOTAL) BY MOUTH 2 (TWO) TIMES DAILY WITH A MEAL. 135 tablet 0   DHA-EPA-Vitamin E (OMEGA-3 COMPLEX) 192-251-11 MG-MG-UNIT CAPS 1 tablet Orally every 4th day     empagliflozin (JARDIANCE) 25 MG TABS tablet Take 1 tablet (25 mg total) by mouth daily before breakfast. 90 tablet 1   ENTRESTO 49-51 MG TAKE 1 TABLET BY MOUTH TWICE A DAY 180 tablet 1   ferrous sulfate 325 (65 FE) MG tablet Take 325 mg by mouth every other day.     finasteride (PROSCAR) 5 MG tablet Take 5 mg by mouth daily.      Ginkgo Biloba 40 MG TABS Take 1 tablet by mouth daily.      glucosamine-chondroitin 500-400 MG tablet Take 1 tablet by mouth daily.     glucose blood (ONETOUCH ULTRA) test strip Check blood sugars once daily 100 strip 12   latanoprost (XALATAN) 0.005 % ophthalmic solution Place 1 drop into both eyes at bedtime.      Multiple Vitamin (MULTIVITAMIN WITH MINERALS) TABS Take 1 tablet by mouth daily.      Semaglutide,0.25 or 0.5MG /DOS, (OZEMPIC, 0.25 OR 0.5 MG/DOSE,) 2 MG/3ML SOPN Inject 0.5 mg into the skin once a week.     tamsulosin (FLOMAX) 0.4 MG CAPS capsule Take  2 capsules (0.8 mg total) by mouth at bedtime.     No current facility-administered medications on file prior to visit.    Review of Systems:  As per HPI- otherwise negative.   Physical Examination: Vitals:   02/25/24 1406  BP: 120/72  Pulse: 99  Resp: 18  Temp: 97.9 F (36.6 C)  SpO2: 99%   Vitals:   02/25/24 1406  Weight: 201 lb 9.6 oz (91.4 kg)  Height: 5\' 10"  (1.778 m)   Body mass index is 28.93 kg/m. Ideal Body Weight: Weight in (lb) to have BMI = 25: 173.9  GEN: no acute distress.  Tall build, looks well HEENT: Atraumatic, Normocephalic.  Bilateral TM wnl, oropharynx normal.  PEERL,EOMI.   Ears and Nose: No external deformity. CV: RRR, No M/G/R. No JVD. No thrill. No extra heart sounds. PULM: CTA B, no wheezes, crackles, rhonchi. No retractions. No resp. distress. No accessory muscle use. ABD: S, NT, ND, +BS. No rebound. No HSM. EXTR: No c/c/e PSYCH: Normally interactive. Conversant.    Assessment and Plan: Chronic kidney disease with active medical management without dialysis, unspecified CKD stage - Plan: Comprehensive metabolic panel  Primary hypertension - Plan: Comprehensive metabolic panel  Type 2 diabetes mellitus with chronic kidney disease, without long-term current use of insulin, unspecified CKD stage (HCC) - Plan: Hemoglobin A1c  Long-term current use of injectable noninsulin  antidiabetic medication  Hyperlipidemia, unspecified hyperlipidemia type - Plan: Lipid panel, atorvastatin (LIPITOR) 20 MG tablet  MGUS (monoclonal gammopathy of unknown significance)  Wheezing - Plan: albuterol (VENTOLIN HFA) 108 (90 Base) MCG/ACT inhaler Patient seen today for follow-up.  Blood pressure continues to be well-controlled Diabetes has generally done well, we will check labs today.  His weight is stabilized and starting a GLP-1 He requests a refill of his inhaler which he uses on occasion MGUS is followed by hematology Will plan further follow- up pending labs.   Signed Abbe Amsterdam, MD  Addendum 3/11, received labs as below.  Message to patient Results for orders placed or performed in visit on 02/25/24  Comprehensive metabolic panel   Collection Time: 02/25/24  2:26 PM  Result Value Ref Range   Sodium 134 (L) 135 - 145 mEq/L   Potassium 4.4 3.5 - 5.1 mEq/L   Chloride 102 96 - 112 mEq/L   CO2 26 19 - 32 mEq/L   Glucose, Bld 162 (H) 70 - 99 mg/dL   BUN 25 (H) 6 - 23 mg/dL   Creatinine, Ser 6.27 (H) 0.40 - 1.50 mg/dL   Total Bilirubin 0.5 0.2 - 1.2 mg/dL   Alkaline Phosphatase 74 39 - 117 U/L   AST 23 0 - 37 U/L   ALT 28 0 - 53 U/L   Total Protein 7.7 6.0 - 8.3 g/dL   Albumin 3.6 3.5 - 5.2 g/dL   GFR 03.50 (L) >09.38 mL/min   Calcium 9.1 8.4 - 10.5 mg/dL  Hemoglobin H8E   Collection Time: 02/25/24  2:26 PM  Result Value Ref Range   Hgb A1c MFr Bld 9.4 (H) 4.6 - 6.5 %  Lipid panel   Collection Time: 02/25/24  2:26 PM  Result Value Ref Range   Cholesterol 116 0 - 200 mg/dL   Triglycerides 993.7 0.0 - 149.0 mg/dL   HDL 16.96 (L) >78.93 mg/dL   VLDL 81.0 0.0 - 17.5 mg/dL   LDL Cholesterol 59 0 - 99 mg/dL   Total CHOL/HDL Ratio 4    NonHDL 87.96

## 2024-02-19 NOTE — Patient Instructions (Addendum)
 It was great to see you today, I will be in touch with your lab work Recommend dose of RSV vaccine, shingles vaccine series if not done already Also recommend COVID booster if none the last 6 months or so  Please let me know if you are not continuing to improve from your recent illness

## 2024-02-20 ENCOUNTER — Ambulatory Visit: Payer: Medicare Other | Admitting: Family Medicine

## 2024-02-20 DIAGNOSIS — N189 Chronic kidney disease, unspecified: Secondary | ICD-10-CM

## 2024-02-20 DIAGNOSIS — I10 Essential (primary) hypertension: Secondary | ICD-10-CM

## 2024-02-20 DIAGNOSIS — E1122 Type 2 diabetes mellitus with diabetic chronic kidney disease: Secondary | ICD-10-CM

## 2024-02-20 DIAGNOSIS — D472 Monoclonal gammopathy: Secondary | ICD-10-CM

## 2024-02-20 DIAGNOSIS — E785 Hyperlipidemia, unspecified: Secondary | ICD-10-CM

## 2024-02-21 ENCOUNTER — Ambulatory Visit: Payer: Medicare Other | Attending: Internal Medicine | Admitting: Internal Medicine

## 2024-02-21 ENCOUNTER — Encounter: Payer: Self-pay | Admitting: Internal Medicine

## 2024-02-21 VITALS — BP 136/88 | HR 81 | Ht 75.0 in | Wt 179.5 lb

## 2024-02-21 DIAGNOSIS — I5022 Chronic systolic (congestive) heart failure: Secondary | ICD-10-CM | POA: Diagnosis not present

## 2024-02-21 DIAGNOSIS — I441 Atrioventricular block, second degree: Secondary | ICD-10-CM | POA: Diagnosis not present

## 2024-02-21 LAB — CUP PACEART INCLINIC DEVICE CHECK
Battery Remaining Longevity: 24 mo
Battery Voltage: 2.93 V
Brady Statistic RA Percent Paced: 17 %
Brady Statistic RV Percent Paced: 98 %
Date Time Interrogation Session: 20250306170104
Implantable Lead Connection Status: 753985
Implantable Lead Connection Status: 753985
Implantable Lead Connection Status: 753985
Implantable Lead Implant Date: 20160822
Implantable Lead Implant Date: 20160822
Implantable Lead Implant Date: 20190118
Implantable Lead Location: 753858
Implantable Lead Location: 753859
Implantable Lead Location: 753860
Implantable Pulse Generator Implant Date: 20190118
Lead Channel Impedance Value: 1125 Ohm
Lead Channel Impedance Value: 450 Ohm
Lead Channel Impedance Value: 550 Ohm
Lead Channel Pacing Threshold Amplitude: 0.75 V
Lead Channel Pacing Threshold Amplitude: 0.75 V
Lead Channel Pacing Threshold Amplitude: 1.25 V
Lead Channel Pacing Threshold Amplitude: 1.25 V
Lead Channel Pacing Threshold Amplitude: 1.5 V
Lead Channel Pacing Threshold Amplitude: 1.5 V
Lead Channel Pacing Threshold Pulse Width: 0.5 ms
Lead Channel Pacing Threshold Pulse Width: 0.5 ms
Lead Channel Pacing Threshold Pulse Width: 0.5 ms
Lead Channel Pacing Threshold Pulse Width: 0.5 ms
Lead Channel Pacing Threshold Pulse Width: 0.5 ms
Lead Channel Pacing Threshold Pulse Width: 0.5 ms
Lead Channel Sensing Intrinsic Amplitude: 1.6 mV
Lead Channel Sensing Intrinsic Amplitude: 12 mV
Lead Channel Setting Pacing Amplitude: 2.25 V
Lead Channel Setting Pacing Amplitude: 2.375
Lead Channel Setting Pacing Amplitude: 3.375
Lead Channel Setting Pacing Pulse Width: 0.5 ms
Lead Channel Setting Pacing Pulse Width: 0.5 ms
Lead Channel Setting Sensing Sensitivity: 4 mV
Pulse Gen Model: 3262
Pulse Gen Serial Number: 8983138

## 2024-02-21 NOTE — Patient Instructions (Signed)
 Medication Instructions:  Your physician recommends that you continue on your current medications as directed. Please refer to the Current Medication list given to you today.  *If you need a refill on your cardiac medications before your next appointment, please call your pharmacy*  Lab Work: None ordered.  You may go to any [facility] Location for your lab work:  [facility] - [address] ([facility]) - [address] [phone] [address]  [place] - [address]   [place]  - [address]   [place] - [address] - [address] [employer]  - [address] [place] - [address] ([facility]   If you have labs (blood work) drawn today and your tests are completely normal, you will receive your results only by: [employer] (if you have MyChart)  If you have any lab test that is abnormal or we need to change your treatment, we will call you or send a MyChart message to review the results.  Testing/Procedures: None ordered.  Follow-Up: At [facility], you and your health needs are our priority.  As part of our continuing mission to provide you with exceptional heart care, we have created designated Provider Care Teams.  These Care Teams include your primary Cardiologist (physician) and Advanced Practice Providers (APPs -  Physician Assistants and Nurse Practitioners) who all work together to provide you with the care you need, when you need it.  Your next appointment:   1 year(s)  The format for your next appointment:   In Person  Provider:   Lewayne Bunting, MD{or one of the following Advanced Practice Providers on your designated Care Team:   Francis Dowse, [place] Casimiro Needle "Mardelle Matte" [place], [place] Earnest Rosier, NP  Note: Remote monitoring is used to monitor your Pacemaker/ ICD from home. This monitoring reduces the number of office visits required to check your  device to one time per year. It allows [place] to keep an eye on the functioning of your device to ensure it is working properly.            Valet parking services will be available as well.

## 2024-02-21 NOTE — Progress Notes (Signed)
 HPI [name] returns today for ongoing evaluation of his PPM after undergoing Biv upgrade just over 6 years ago. In the interim, he has done well. He is back to working regularly. He works in Therapist, art. No syncope. He admits to dietary indiscretion with sodium. He notes that his bp is improved. He feels well. He is exercising regularly.  Allergies  Allergen Reactions   Amlodipine Swelling   Crestor [Rosuvastatin] Other (See Comments)    Per pt unable to focus     Current Outpatient Medications  Medication Sig Dispense Refill   albuterol (VENTOLIN HFA) 108 (90 Base) MCG/ACT inhaler TAKE 2 PUFFS BY MOUTH EVERY 6 HOURS AS NEEDED FOR WHEEZE OR SHORTNESS OF BREATH 8.5 each 1   atorvastatin (LIPITOR) 20 MG tablet Take 1 tablet (20 mg total) by mouth daily. 90 tablet 3   carvedilol (COREG) 25 MG tablet TAKE 1 AND 1/2 TABLETS (37.5 MG TOTAL) BY MOUTH 2 (TWO) TIMES DAILY WITH A MEAL. 135 tablet 0   empagliflozin (JARDIANCE) 25 MG TABS tablet Take 1 tablet (25 mg total) by mouth daily before breakfast. 90 tablet 1   ENTRESTO 49-51 MG TAKE 1 TABLET BY MOUTH TWICE A DAY 180 tablet 1   ferrous sulfate 325 (65 FE) MG tablet Take 325 mg by mouth every other day.     finasteride (PROSCAR) 5 MG tablet Take 5 mg by mouth daily.     Ginkgo Biloba 40 MG TABS Take 1 tablet by mouth daily.      glucosamine-chondroitin 500-400 MG tablet Take 1 tablet by mouth daily.     glucose blood (ONETOUCH ULTRA) test strip Check blood sugars once daily 100 strip 12   latanoprost (XALATAN) 0.005 % ophthalmic solution Place 1 drop into both eyes at bedtime.      Multiple Vitamin (MULTIVITAMIN WITH MINERALS) TABS Take 1 tablet by mouth daily.      Semaglutide,0.25 or 0.5MG /DOS, (OZEMPIC, 0.25 OR 0.5 MG/DOSE,) 2 MG/3ML SOPN Inject 0.5 mg into the skin once a week.     tamsulosin (FLOMAX) 0.4 MG CAPS capsule Take 2 capsules (0.8 mg total) by mouth at bedtime.     DHA-EPA-Vitamin E (OMEGA-3 COMPLEX)  192-251-11 MG-MG-UNIT CAPS 1 tablet Orally every 4th day     No current facility-administered medications for this visit.     Past Medical History:  Diagnosis Date   Anemia    Cataract    Chronic systolic (congestive) heart failure (HCC)    Diabetes mellitus    GERD (gastroesophageal reflux disease)    Hyperlipidemia    Hypertension    Iron deficiency anemia due to chronic blood loss 10/02/2019   MGUS (monoclonal gammopathy of unknown significance)    Multiple myeloma (HCC) 02/2013   normal cytogenetics and FISH panel on 03/11/2013.    Osteoarthritis    Presence of permanent cardiac pacemaker    Right bundle branch block    Syncope     ROS:   All systems reviewed and negative except as noted in the HPI.   Past Surgical History:  Procedure Laterality Date   BIV UPGRADE  01/04/2018   BIV UPGRADE N/A 01/04/2018   Procedure: BIVP UPGRADE;  Surgeon: Marinus Maw, MD;  Location: [facility] INVASIVE CV LAB;  Service: Cardiovascular;  Laterality: N/A;   EP IMPLANTABLE DEVICE N/A 08/09/2015   Procedure: Pacemaker Implant;  Surgeon: Marinus Maw, MD;  Location: [facility] INVASIVE CV LAB;  Service: Cardiovascular;  Laterality: N/A;  EYE SURGERY       Family History  Problem Relation Age of Onset   Hypertension Mother    Cancer Mother    Stroke Mother    Hypertension Sister    Hypertension Brother    Hypertension Maternal Grandmother      Social History   Socioeconomic History   Marital status: Married    Spouse name: Not on file   Number of children: 3   Years of education: Not on file   Highest education level: Associate degree: occupational, Scientist, product/process development, or vocational program  Occupational History   Not on file  Tobacco Use   Smoking status: Former    Current packs/day: 0.00    Average packs/day: 2.0 packs/day for 35.0 years (70.1 ttl pk-yrs)    Types: Cigarettes    Start date: 03/26/1963    Quit date: 12/18/1997    Years since quitting: 26.1   Smokeless tobacco: Never    Tobacco comments:    quit 15 years  Vaping Use   Vaping status: Never Used  Substance and Sexual Activity   Alcohol use: No    Alcohol/week: 0.0 standard drinks of alcohol   Drug use: No   Sexual activity: Yes    Birth control/protection: None  Other Topics Concern   Not on file  Social History Narrative   Married   Building Maintenance   Social Drivers of Health   Financial Resource Strain: Low Risk  (01/24/2024)   Overall Financial Resource Strain (CARDIA)    Difficulty of Paying Living Expenses: Not hard at all  Food Insecurity: No Food Insecurity (01/24/2024)   Hunger Vital Sign    Worried About Running Out of Food in the Last Year: Never true    Ran Out of Food in the Last Year: Never true  Transportation Needs: No Transportation Needs (01/24/2024)   PRAPARE - Administrator, Civil Service (Medical): No    Lack of Transportation (Non-Medical): No  Physical Activity: Insufficiently Active (01/24/2024)   Exercise Vital Sign    Days of Exercise per Week: 4 days    Minutes of Exercise per Session: 30 min  Stress: No Stress Concern Present (01/24/2024)   [employer] of Occupational Health - Occupational Stress Questionnaire    Feeling of Stress : Only a little  Social Connections: Moderately Integrated (01/24/2024)   Social Connection and Isolation Panel [NHANES]    Frequency of Communication with Friends and Family: Once a week    Frequency of Social Gatherings with Friends and Family: Twice a week    Attends Religious Services: More than 4 times per year    Active Member of [employer] or Organizations: No    Attends Banker Meetings: Never    Marital Status: Married  Catering manager Violence: Not At Risk (01/24/2024)   Humiliation, Afraid, Rape, and Kick questionnaire    Fear of Current or Ex-Partner: No    Emotionally Abused: No    Physically Abused: No    Sexually Abused: No     BP 136/88   Pulse 81   Ht 6\' 3"  (1.905 m)   Wt 179 lb 7.3 oz (81.4  kg)   SpO2 97%   BMI 22.43 kg/m   Physical Exam:  Well appearing NAD HEENT: Unremarkable Neck:  No JVD, no thyromegally Lymphatics:  No adenopathy Back:  No CVA tenderness Lungs:  Clear HEART:  Regular rate rhythm, no murmurs, no rubs, no clicks Abd:  soft, positive bowel sounds, no organomegally,  no rebound, no guarding Ext:  2 plus pulses, no edema, no cyanosis, no clubbing Skin:  No rashes no nodules Neuro:  CN II through XII intact, motor grossly intact  EKG - NSR with biv pacing  DEVICE  Normal device function.  See PaceArt for details.   Assess/Plan:  1. Chronic systolic heart failure - he is asymptomatic, s/p biv ICD. 2. ICD - his St. Jude Biv ICD is working normally. 3. CHB - he is asymptomatic s/p biv device 4. Hyponatremia - he has reduced his fluid intake. We will follow.    Sharlot Gowda Khaled Herda,MD

## 2024-02-22 DIAGNOSIS — H34811 Central retinal vein occlusion, right eye, with macular edema: Secondary | ICD-10-CM | POA: Diagnosis not present

## 2024-02-25 ENCOUNTER — Ambulatory Visit (INDEPENDENT_AMBULATORY_CARE_PROVIDER_SITE_OTHER): Admitting: Family Medicine

## 2024-02-25 VITALS — BP 120/72 | HR 99 | Temp 97.9°F | Resp 18 | Ht 70.0 in | Wt 201.6 lb

## 2024-02-25 DIAGNOSIS — E785 Hyperlipidemia, unspecified: Secondary | ICD-10-CM

## 2024-02-25 DIAGNOSIS — R062 Wheezing: Secondary | ICD-10-CM

## 2024-02-25 DIAGNOSIS — D472 Monoclonal gammopathy: Secondary | ICD-10-CM | POA: Diagnosis not present

## 2024-02-25 DIAGNOSIS — N189 Chronic kidney disease, unspecified: Secondary | ICD-10-CM | POA: Diagnosis not present

## 2024-02-25 DIAGNOSIS — I10 Essential (primary) hypertension: Secondary | ICD-10-CM | POA: Diagnosis not present

## 2024-02-25 DIAGNOSIS — Z7985 Long-term (current) use of injectable non-insulin antidiabetic drugs: Secondary | ICD-10-CM | POA: Diagnosis not present

## 2024-02-25 DIAGNOSIS — E1122 Type 2 diabetes mellitus with diabetic chronic kidney disease: Secondary | ICD-10-CM | POA: Diagnosis not present

## 2024-02-25 LAB — COMPREHENSIVE METABOLIC PANEL
ALT: 28 U/L (ref 0–53)
AST: 23 U/L (ref 0–37)
Albumin: 3.6 g/dL (ref 3.5–5.2)
Alkaline Phosphatase: 74 U/L (ref 39–117)
BUN: 25 mg/dL — ABNORMAL HIGH (ref 6–23)
CO2: 26 meq/L (ref 19–32)
Calcium: 9.1 mg/dL (ref 8.4–10.5)
Chloride: 102 meq/L (ref 96–112)
Creatinine, Ser: 1.75 mg/dL — ABNORMAL HIGH (ref 0.40–1.50)
GFR: 36.88 mL/min — ABNORMAL LOW (ref >60.00)
Glucose, Bld: 162 mg/dL — ABNORMAL HIGH (ref 70–99)
Potassium: 4.4 meq/L (ref 3.5–5.1)
Sodium: 134 meq/L — ABNORMAL LOW (ref 135–145)
Total Bilirubin: 0.5 mg/dL (ref 0.2–1.2)
Total Protein: 7.7 g/dL (ref 6.0–8.3)

## 2024-02-25 LAB — LIPID PANEL
Cholesterol: 116 mg/dL (ref 0–200)
HDL: 27.7 mg/dL — ABNORMAL LOW (ref >39.00)
LDL Cholesterol: 59 mg/dL (ref 0–99)
NonHDL: 87.96
Total CHOL/HDL Ratio: 4
Triglycerides: 143 mg/dL (ref 0.0–149.0)
VLDL: 28.6 mg/dL (ref 0.0–40.0)

## 2024-02-25 LAB — HEMOGLOBIN A1C: Hgb A1c MFr Bld: 9.4 % — ABNORMAL HIGH (ref 4.6–6.5)

## 2024-02-25 MED ORDER — ATORVASTATIN CALCIUM 20 MG PO TABS: 20.0000 mg | ORAL_TABLET | Freq: Every day | ORAL | 3 refills | Status: AC

## 2024-02-25 MED ORDER — ALBUTEROL SULFATE HFA 108 (90 BASE) MCG/ACT IN AERS: 1.0000 | INHALATION_SPRAY | Freq: Four times a day (QID) | RESPIRATORY_TRACT | 6 refills | Status: AC | PRN

## 2024-02-26 ENCOUNTER — Encounter: Payer: Self-pay | Admitting: Family Medicine

## 2024-02-27 ENCOUNTER — Encounter: Payer: Self-pay | Admitting: Family Medicine

## 2024-03-10 DIAGNOSIS — E1151 Type 2 diabetes mellitus with diabetic peripheral angiopathy without gangrene: Secondary | ICD-10-CM | POA: Diagnosis not present

## 2024-03-10 DIAGNOSIS — M19071 Primary osteoarthritis, right ankle and foot: Secondary | ICD-10-CM | POA: Diagnosis not present

## 2024-03-10 DIAGNOSIS — L602 Onychogryphosis: Secondary | ICD-10-CM | POA: Diagnosis not present

## 2024-03-10 DIAGNOSIS — L84 Corns and callosities: Secondary | ICD-10-CM | POA: Diagnosis not present

## 2024-03-10 DIAGNOSIS — B351 Tinea unguium: Secondary | ICD-10-CM | POA: Diagnosis not present

## 2024-03-10 DIAGNOSIS — M19072 Primary osteoarthritis, left ankle and foot: Secondary | ICD-10-CM | POA: Diagnosis not present

## 2024-04-03 ENCOUNTER — Ambulatory Visit (INDEPENDENT_AMBULATORY_CARE_PROVIDER_SITE_OTHER): Payer: Medicare Other

## 2024-04-03 DIAGNOSIS — I441 Atrioventricular block, second degree: Secondary | ICD-10-CM | POA: Diagnosis not present

## 2024-04-05 LAB — CUP PACEART REMOTE DEVICE CHECK
Battery Remaining Longevity: 22 mo
Battery Remaining Percentage: 23 %
Battery Voltage: 2.93 V
Brady Statistic AP VP Percent: 23 %
Brady Statistic AP VS Percent: 1 %
Brady Statistic AS VP Percent: 74 %
Brady Statistic AS VS Percent: 1.6 %
Brady Statistic RA Percent Paced: 22 %
Date Time Interrogation Session: 20250418000019
Implantable Lead Connection Status: 753985
Implantable Lead Connection Status: 753985
Implantable Lead Connection Status: 753985
Implantable Lead Implant Date: 20160822
Implantable Lead Implant Date: 20160822
Implantable Lead Implant Date: 20190118
Implantable Lead Location: 753858
Implantable Lead Location: 753859
Implantable Lead Location: 753860
Implantable Pulse Generator Implant Date: 20190118
Lead Channel Impedance Value: 1175 Ohm
Lead Channel Impedance Value: 390 Ohm
Lead Channel Impedance Value: 540 Ohm
Lead Channel Pacing Threshold Amplitude: 1.125 V
Lead Channel Pacing Threshold Amplitude: 1.25 V
Lead Channel Pacing Threshold Amplitude: 1.375 V
Lead Channel Pacing Threshold Pulse Width: 0.5 ms
Lead Channel Pacing Threshold Pulse Width: 0.5 ms
Lead Channel Pacing Threshold Pulse Width: 0.5 ms
Lead Channel Sensing Intrinsic Amplitude: 12 mV
Lead Channel Sensing Intrinsic Amplitude: 2.5 mV
Lead Channel Setting Pacing Amplitude: 2.125
Lead Channel Setting Pacing Amplitude: 2.25 V
Lead Channel Setting Pacing Amplitude: 2.375
Lead Channel Setting Pacing Pulse Width: 0.5 ms
Lead Channel Setting Pacing Pulse Width: 0.5 ms
Lead Channel Setting Sensing Sensitivity: 4 mV
Pulse Gen Model: 3262
Pulse Gen Serial Number: 8983138

## 2024-04-06 ENCOUNTER — Other Ambulatory Visit: Payer: Self-pay | Admitting: Family Medicine

## 2024-04-06 DIAGNOSIS — E0829 Diabetes mellitus due to underlying condition with other diabetic kidney complication: Secondary | ICD-10-CM

## 2024-04-08 ENCOUNTER — Encounter: Payer: Self-pay | Admitting: Internal Medicine

## 2024-04-17 ENCOUNTER — Telehealth: Payer: Self-pay

## 2024-04-17 NOTE — Telephone Encounter (Signed)
 Called pt and left pt a VM to inform him, his Ozempic  is ready for pick up. Pt is aware and expressed understanding.

## 2024-04-19 ENCOUNTER — Other Ambulatory Visit: Payer: Self-pay | Admitting: Internal Medicine

## 2024-04-22 DIAGNOSIS — H524 Presbyopia: Secondary | ICD-10-CM | POA: Diagnosis not present

## 2024-04-22 DIAGNOSIS — H2513 Age-related nuclear cataract, bilateral: Secondary | ICD-10-CM | POA: Diagnosis not present

## 2024-04-29 DIAGNOSIS — M19071 Primary osteoarthritis, right ankle and foot: Secondary | ICD-10-CM | POA: Diagnosis not present

## 2024-04-29 DIAGNOSIS — L84 Corns and callosities: Secondary | ICD-10-CM | POA: Diagnosis not present

## 2024-04-29 DIAGNOSIS — E1151 Type 2 diabetes mellitus with diabetic peripheral angiopathy without gangrene: Secondary | ICD-10-CM | POA: Diagnosis not present

## 2024-04-29 DIAGNOSIS — L602 Onychogryphosis: Secondary | ICD-10-CM | POA: Diagnosis not present

## 2024-04-29 DIAGNOSIS — M19072 Primary osteoarthritis, left ankle and foot: Secondary | ICD-10-CM | POA: Diagnosis not present

## 2024-05-14 NOTE — Progress Notes (Signed)
 Remote pacemaker transmission.

## 2024-05-14 NOTE — Addendum Note (Signed)
 Addended by: Lott Rouleau A on: 05/14/2024 10:24 AM   Modules accepted: Orders

## 2024-05-21 ENCOUNTER — Ambulatory Visit (INDEPENDENT_AMBULATORY_CARE_PROVIDER_SITE_OTHER): Payer: Medicare Other | Admitting: Pharmacist

## 2024-05-21 DIAGNOSIS — Z7985 Long-term (current) use of injectable non-insulin antidiabetic drugs: Secondary | ICD-10-CM

## 2024-05-21 MED ORDER — ONETOUCH DELICA LANCETS 33G MISC: 3 refills | Status: DC

## 2024-05-21 MED ORDER — ONETOUCH ULTRA VI STRP: ORAL_STRIP | 3 refills | Status: DC

## 2024-05-21 NOTE — Progress Notes (Signed)
 Pharmacy Note  05/21/2024 Name: Michael Neville Sr. MRN: [id number] DOB: [birth year]  Subjective: Michael Locy Sr. is a 78 y.o. year old male who is a primary care patient of [name], Skipper Dumas, MD. Clinical Pharmacist Practitioner referral was placed to assist with medication, CHF and diabetes management.     Medication Management:  Patient has been getting Entresto  and Jardiance  thru the [employer] Cardiomyopathy fund. He is due to renew [employer] today. He has no cost related to these meds at this time.  He was approved for [employer] medication assistance program for Ozempic  in 2024 and we have submitted application for 2025.    Type 2 DM -  Current therapy - Jardiance  25mg  daily and Ozempic  0.5mg  weekly.  Checking blood glucose at home 118 to 125 in morning / fasting  Wt Readings from Last 3 Encounters:  02/25/24 201 lb 9.6 oz (91.4 kg)  02/21/24 179 lb 7.3 oz (81.4 kg)  01/24/24 199 lb (90.3 kg)    Denies hypoglycemia and signs of hyperglycemia.  He reports his weight has been stable over the last month but there was a 20lbs difference in weight in March He is exercising about 3 to 4 days per week- treadmill and lifting weights. He also is active around his home.    Objective: Review of patient status, including review of consultants reports, laboratory and other test data, was performed as part of comprehensive evaluation and provision of chronic care management services.   Lab Results  Component Value Date   CREATININE 1.75 (H) 02/25/2024   CREATININE 1.81 (H) 08/22/2023   CREATININE 1.65 (H) 06/06/2023    Lab Results  Component Value Date   HGBA1C 9.4 (H) 02/25/2024       Component Value Date/Time   CHOL 116 02/25/2024 1426   CHOL 151 12/26/2017 0847   TRIG 143.0 02/25/2024 1426   HDL 27.70 (L) 02/25/2024 1426   HDL 34 (L) 12/26/2017 0847   CHOLHDL 4 02/25/2024 1426   VLDL 28.6 02/25/2024 1426   LDLCALC 59 02/25/2024 1426   LDLCALC 104 (H) 12/26/2017  0847     Clinical ASCVD: Yes  The ASCVD Risk score (Arnett DK, et al., 2019) failed to calculate for the following reasons:   The valid total cholesterol range is 130 to 320 mg/dL    BP Readings from Last 3 Encounters:  02/25/24 120/72  02/21/24 136/88  10/10/23 138/80    Allergies  Allergen Reactions   Amlodipine  Swelling   Crestor [Rosuvastatin] Other (See Comments)    Per pt unable to focus    Medications Reviewed Today   Medications were not reviewed in this encounter     Patient Active Problem List   Diagnosis Date Noted   Chronic kidney disease (CKD), active medical management without dialysis 10/09/2019   Iron  deficiency anemia due to chronic blood loss 10/02/2019   Chronic systolic (congestive) heart failure (HCC) 01/04/2018   Pacemaker 12/08/2015   Mobitz type 2 second degree heart block 08/08/2015   Right bundle branch block 12/16/2014   Syncope 12/16/2014   Proteinuria due to type 2 diabetes mellitus (HCC) 11/12/2014   Encounter for therapeutic drug monitoring 03/13/2013   Multiple myeloma (HCC) 02/15/2013   Edema 01/07/2013   Diabetes mellitus due to underlying condition with other diabetic kidney complication (HCC)    Hyperlipidemia    Hypertension    MGUS (monoclonal gammopathy of unknown significance)    GERD (gastroesophageal reflux disease)       Assessment /  Plan: Type 2 DM- A1c not at goal but per patient his blood glucose has improved at home; only checking fasting blood glucose.  Continue current therapy - Ozempic  0.5mg  weekly and Jardiance  25mg  daily  Updated Rx for testing supplies Reviewed home blood glucose goals. Patient will check 1 or 2 times per day at varying times of day. If blood glucose > 180 he will contact office.   Medication Assistance:   Updated [employer] Grant for cardiomyopathy - will cover Jardiance  and Entresto  cost.   Amount of grant $4500. Good thru 06/17/2025  Approved for Novo Nordisk medication assistance program  for Ozempic  thru 12/17/2024. Patient has 3 pens of Ozempic  on hand so should last until he receives first delivery from [employer].   Reviewed medication list and discuss adherence.   Meds ordered this encounter  Medications   glucose blood (ONETOUCH ULTRA) test strip    Sig: Use to check blood lgucose once daily    Dispense:  100 strip    Refill:  3    Dx: E11.9   OneTouch Delica Lancets 33G MISC    Sig: Use to check blood glucose once daily (Dx: E11.9)    Dispense:  100 each    Refill:  3    Follow up planned in July 2025  Cecilie Coffee, PharmD Clinical Pharmacist [facility]  - [facility] [phone]

## 2024-05-28 DIAGNOSIS — H34811 Central retinal vein occlusion, right eye, with macular edema: Secondary | ICD-10-CM | POA: Diagnosis not present

## 2024-06-02 ENCOUNTER — Other Ambulatory Visit: Payer: Self-pay

## 2024-06-02 ENCOUNTER — Encounter: Payer: Self-pay | Admitting: Hematology & Oncology

## 2024-06-02 ENCOUNTER — Inpatient Hospital Stay: Payer: Medicare Other | Attending: Hematology & Oncology

## 2024-06-02 ENCOUNTER — Inpatient Hospital Stay: Payer: Medicare Other | Admitting: Hematology & Oncology

## 2024-06-02 VITALS — BP 141/90 | HR 72 | Temp 97.9°F | Resp 16 | Ht 75.0 in | Wt 201.0 lb

## 2024-06-02 DIAGNOSIS — Z7985 Long-term (current) use of injectable non-insulin antidiabetic drugs: Secondary | ICD-10-CM | POA: Diagnosis not present

## 2024-06-02 DIAGNOSIS — E119 Type 2 diabetes mellitus without complications: Secondary | ICD-10-CM | POA: Diagnosis not present

## 2024-06-02 DIAGNOSIS — D472 Monoclonal gammopathy: Secondary | ICD-10-CM | POA: Diagnosis not present

## 2024-06-02 DIAGNOSIS — R634 Abnormal weight loss: Secondary | ICD-10-CM | POA: Diagnosis not present

## 2024-06-02 DIAGNOSIS — C9000 Multiple myeloma not having achieved remission: Secondary | ICD-10-CM

## 2024-06-02 LAB — CMP (CANCER CENTER ONLY)
ALT: 19 U/L (ref 0–44)
AST: 17 U/L (ref 15–41)
Albumin: 4.2 g/dL (ref 3.5–5.0)
Alkaline Phosphatase: 60 U/L (ref 38–126)
Anion gap: 7 (ref 5–15)
BUN: 29 mg/dL — ABNORMAL HIGH (ref 8–23)
CO2: 26 mmol/L (ref 22–32)
Calcium: 9.6 mg/dL (ref 8.9–10.3)
Chloride: 103 mmol/L (ref 98–111)
Creatinine: 1.75 mg/dL — ABNORMAL HIGH (ref 0.61–1.24)
GFR, Estimated: 39 mL/min — ABNORMAL LOW (ref >60)
Glucose, Bld: 147 mg/dL — ABNORMAL HIGH (ref 70–99)
Potassium: 4.4 mmol/L (ref 3.5–5.1)
Sodium: 136 mmol/L (ref 135–145)
Total Bilirubin: 0.4 mg/dL (ref 0.0–1.2)
Total Protein: 8.5 g/dL — ABNORMAL HIGH (ref 6.5–8.1)

## 2024-06-02 LAB — CBC WITH DIFFERENTIAL (CANCER CENTER ONLY)
Abs Immature Granulocytes: 0.02 10*3/uL (ref 0.00–0.07)
Basophils Absolute: 0 10*3/uL (ref 0.0–0.1)
Basophils Relative: 1 %
Eosinophils Absolute: 0.4 10*3/uL (ref 0.0–0.5)
Eosinophils Relative: 8 %
HCT: 41.9 % (ref 39.0–52.0)
Hemoglobin: 13.7 g/dL (ref 13.0–17.0)
Immature Granulocytes: 0 %
Lymphocytes Relative: 27 %
Lymphs Abs: 1.5 10*3/uL (ref 0.7–4.0)
MCH: 29.8 pg (ref 26.0–34.0)
MCHC: 32.7 g/dL (ref 30.0–36.0)
MCV: 91.1 fL (ref 80.0–100.0)
Monocytes Absolute: 0.5 10*3/uL (ref 0.1–1.0)
Monocytes Relative: 8 %
Neutro Abs: 3.2 10*3/uL (ref 1.7–7.7)
Neutrophils Relative %: 56 %
Platelet Count: 193 10*3/uL (ref 150–400)
RBC: 4.6 MIL/uL (ref 4.22–5.81)
RDW: 14.4 % (ref 11.5–15.5)
WBC Count: 5.7 10*3/uL (ref 4.0–10.5)
nRBC: 0 % (ref 0.0–0.2)

## 2024-06-02 LAB — LACTATE DEHYDROGENASE: LDH: 136 U/L (ref 98–192)

## 2024-06-02 NOTE — Progress Notes (Signed)
 Hematology and Oncology Follow Up Visit  Michael Wiggins. [id number] 01-Jan-1946 78 y.o. 06/02/2024   Principle Diagnosis:  IgG kappa smoldering myeloma Iron  deficiency anemia -- Venofer  given on 03/2020  Current Therapy:   Observation     Interim History:  [name] is back for followup.  The last time that we saw him was back a year ago.  I think he is on Ozempic  right now.  He is lost some weight.  I think this is for his diabetes.  When we last saw him, his myeloma studies were up a little bit.  I think that his M spike was 1.8 g/dL.  His IgG level was 2823 mg/dL.  His kappa light chain was 13.8 mg/dL.  I think we are at the point now we will have to think about doing a PET scan to look at his bones.  I also want to do a 24-hour urine to see how much light chain he is producing.    Okay to mention that I think the daughter is getting married up in the [place] area.  I think this is an August.  I would like to hope that he will be in good shape to go up there.  He is still doing work.  I am really not surprised by this.  He is has always been quite vigorous.  Again his diabetes has been the biggest problem.  Again he is on Ozempic .  He is lost some weight.  He has had no diarrhea.  He has had no fever.  He has had no problems with COVID or Influenza.  Overall, I would say that his performance status is probably ECOG 1..    Medications:  Current Outpatient Medications:    carvedilol  (COREG ) 25 MG tablet, TAKE 1 AND 1/2 TABLETS (37.5 MG TOTAL) BY MOUTH 2 (TWO) TIMES DAILY WITH A MEAL. (Patient taking differently: Take 12.5 mg by mouth 2 (two) times daily with a meal. Take half a tablet twice a day), Disp: 270 tablet, Rfl: 2   albuterol  (VENTOLIN  HFA) 108 (90 Base) MCG/ACT inhaler, Inhale 1-2 puffs into the lungs every 6 (six) hours as needed for wheezing or shortness of breath., Disp: 8.5 each, Rfl: 6   atorvastatin  (LIPITOR) 20 MG tablet, Take 1 tablet (20 mg total) by  mouth daily., Disp: 90 tablet, Rfl: 3   DHA-EPA-Vitamin E (OMEGA-3 COMPLEX) 192-251-11 MG-MG-UNIT CAPS, Take 1 capsule by mouth every other day., Disp: , Rfl:    ENTRESTO  49-51 MG, TAKE 1 TABLET BY MOUTH TWICE A DAY, Disp: 180 tablet, Rfl: 1   ferrous sulfate  325 (65 FE) MG tablet, Take 325 mg by mouth every other day., Disp: , Rfl:    finasteride (PROSCAR) 5 MG tablet, Take 5 mg by mouth daily., Disp: , Rfl:    Ginkgo Biloba 40 MG TABS, Take 1 tablet by mouth daily. , Disp: , Rfl:    glucosamine-chondroitin 500-400 MG tablet, Take 1 tablet by mouth daily., Disp: , Rfl:    glucose blood (ONETOUCH ULTRA) test strip, Use to check blood lgucose once daily, Disp: 100 strip, Rfl: 3   JARDIANCE  25 MG TABS tablet, TAKE 1 TABLET BY MOUTH DAILY BEFORE BREAKFAST., Disp: 90 tablet, Rfl: 1   latanoprost  (XALATAN ) 0.005 % ophthalmic solution, Place 1 drop into both eyes at bedtime. , Disp: , Rfl:    Multiple Vitamin (MULTIVITAMIN WITH MINERALS) TABS, Take 1 tablet by mouth daily. , Disp: , Rfl:    OneTouch  Delica Lancets 33G MISC, Use to check blood glucose once daily (Dx: E11.9), Disp: 100 each, Rfl: 3   Semaglutide ,0.25 or 0.5MG /DOS, (OZEMPIC , 0.25 OR 0.5 MG/DOSE,) 2 MG/3ML SOPN, Inject 0.5 mg into the skin once a week., Disp: , Rfl:    tamsulosin (FLOMAX) 0.4 MG CAPS capsule, Take 2 capsules (0.8 mg total) by mouth at bedtime., Disp: , Rfl:   Allergies:  Allergies  Allergen Reactions   Amlodipine  Swelling   Crestor [Rosuvastatin] Other (See Comments)    Per pt unable to focus    Past Medical History, Surgical history, Social history, and Family History were reviewed and updated.  Review of Systems: Review of Systems  Constitutional: Negative.   HENT: Negative.    Eyes: Negative.   Respiratory: Negative.    Cardiovascular: Negative.   Gastrointestinal: Negative.   Genitourinary: Negative.   Musculoskeletal: Negative.   Skin: Negative.   Neurological: Negative.   Endo/Heme/Allergies:  Negative.   Psychiatric/Behavioral: Negative.       Physical Exam: Vital signs are temperature of 97.9.  Pulse 72.  Blood pressure 141/90.  Weight is 201 pound.    Physical Exam Vitals reviewed.  HENT:     Head: Normocephalic and atraumatic.   Eyes:     Pupils: Pupils are equal, round, and reactive to light.    Cardiovascular:     Rate and Rhythm: Normal rate and regular rhythm.     Heart sounds: Normal heart sounds.  Pulmonary:     Effort: Pulmonary effort is normal.     Breath sounds: Normal breath sounds.  Abdominal:     General: Bowel sounds are normal.     Palpations: Abdomen is soft.   Musculoskeletal:        General: No tenderness or deformity. Normal range of motion.     Cervical back: Normal range of motion.  Lymphadenopathy:     Cervical: No cervical adenopathy.   Skin:    General: Skin is warm and dry.     Findings: No erythema or rash.   Neurological:     Mental Status: He is alert and oriented to person, place, and time.   Psychiatric:        Behavior: Behavior normal.        Thought Content: Thought content normal.        Judgment: Judgment normal.     Lab Results  Component Value Date   WBC 5.7 06/02/2024   HGB 13.7 06/02/2024   HCT 41.9 06/02/2024   MCV 91.1 06/02/2024   PLT 193 06/02/2024     Chemistry      Component Value Date/Time   NA 136 06/02/2024 0927   NA 133 (L) 01/12/2020 1015   NA 142 10/03/2017 0910   NA 137 04/03/2017 0852   K 4.4 06/02/2024 0927   K 3.9 10/03/2017 0910   K 4.2 04/03/2017 0852   CL 103 06/02/2024 0927   CL 103 10/03/2017 0910   CL 108 (H) 05/15/2013 0912   CO2 26 06/02/2024 0927   CO2 28 10/03/2017 0910   CO2 26 04/03/2017 0852   BUN 29 (H) 06/02/2024 0927   BUN 25 01/12/2020 1015   BUN 13 10/03/2017 0910   BUN 16.3 04/03/2017 0852   CREATININE 1.75 (H) 06/02/2024 0927   CREATININE 1.07 11/16/2017 1437   CREATININE 1.0 04/03/2017 0852      Component Value Date/Time   CALCIUM  9.6 06/02/2024  0927   CALCIUM  9.3 10/03/2017 0910   CALCIUM  9.0  04/03/2017 0852   ALKPHOS 60 06/02/2024 0927   ALKPHOS 51 10/03/2017 0910   ALKPHOS 46 04/03/2017 0852   AST 17 06/02/2024 0927   AST 18 04/03/2017 0852   ALT 19 06/02/2024 0927   ALT 34 10/03/2017 0910   ALT 19 04/03/2017 0852   BILITOT 0.4 06/02/2024 0927   BILITOT 0.38 04/03/2017 0852     Impression and Plan: Mr. Laban is 78 year old gentleman with IgG kappa smoldering myeloma.  I have been watching him for several years.  Again, his monoclonal studies have been slowly rising.  We will have to see what the levels are at this time.  Again, I think a PET scan would be very helpful to assess his bones.  I also think that a 24-hour urine would be helpful to assess the urine.  We are going to follow him a little bit more closely.  Again, I will have a relatively low threshold for treating him.  I think we have much better options now and I think he would be a little more amenable to treatment.  We will plan to get him back to see me in about 3 months.    Ivor Mars, MD 6/16/202511:00 AM

## 2024-06-03 LAB — IGG, IGA, IGM
IgA: 248 mg/dL (ref 61–437)
IgG (Immunoglobin G), Serum: 3031 mg/dL — ABNORMAL HIGH (ref 603–1613)
IgM (Immunoglobulin M), Srm: 26 mg/dL (ref 15–143)

## 2024-06-03 LAB — KAPPA/LAMBDA LIGHT CHAINS
Kappa free light chain: 148.4 mg/L — ABNORMAL HIGH (ref 3.3–19.4)
Kappa, lambda light chain ratio: 7.07 — ABNORMAL HIGH (ref 0.26–1.65)
Lambda free light chains: 21 mg/L (ref 5.7–26.3)

## 2024-06-05 ENCOUNTER — Inpatient Hospital Stay

## 2024-06-05 DIAGNOSIS — D472 Monoclonal gammopathy: Secondary | ICD-10-CM | POA: Diagnosis not present

## 2024-06-05 DIAGNOSIS — C9000 Multiple myeloma not having achieved remission: Secondary | ICD-10-CM

## 2024-06-05 DIAGNOSIS — E119 Type 2 diabetes mellitus without complications: Secondary | ICD-10-CM | POA: Diagnosis not present

## 2024-06-05 DIAGNOSIS — Z7985 Long-term (current) use of injectable non-insulin antidiabetic drugs: Secondary | ICD-10-CM | POA: Diagnosis not present

## 2024-06-05 DIAGNOSIS — R634 Abnormal weight loss: Secondary | ICD-10-CM | POA: Diagnosis not present

## 2024-06-05 LAB — PROTEIN ELECTROPHORESIS, SERUM, WITH REFLEX
A/G Ratio: 0.8 (ref 0.7–1.7)
Albumin ELP: 3.4 g/dL (ref 2.9–4.4)
Alpha-1-Globulin: 0.2 g/dL (ref 0.0–0.4)
Alpha-2-Globulin: 0.8 g/dL (ref 0.4–1.0)
Beta Globulin: 1 g/dL (ref 0.7–1.3)
Gamma Globulin: 2.4 g/dL — ABNORMAL HIGH (ref 0.4–1.8)
Globulin, Total: 4.4 g/dL — ABNORMAL HIGH (ref 2.2–3.9)
M-Spike, %: 1.8 g/dL — ABNORMAL HIGH
SPEP Interpretation: 0
Total Protein ELP: 7.8 g/dL (ref 6.0–8.5)

## 2024-06-05 LAB — IMMUNOFIXATION REFLEX, SERUM
IgA: 238 mg/dL (ref 61–437)
IgG (Immunoglobin G), Serum: 2960 mg/dL — ABNORMAL HIGH (ref 603–1613)
IgM (Immunoglobulin M), Srm: 23 mg/dL (ref 15–143)

## 2024-06-09 LAB — UPEP/UIFE/LIGHT CHAINS/TP, 24-HR UR
% BETA, Urine: 29.3 %
ALPHA 1 URINE: 2.4 %
Albumin, U: 56.8 %
Alpha 2, Urine: 5.4 %
Free Kappa Lt Chains,Ur: 226.36 mg/L — ABNORMAL HIGH (ref 1.17–86.46)
Free Kappa/Lambda Ratio: 28.87 — ABNORMAL HIGH (ref 1.83–14.26)
Free Lambda Lt Chains,Ur: 7.84 mg/L (ref 0.27–15.21)
GAMMA GLOBULIN URINE: 6 %
M-SPIKE %, Urine: 7.4 % — ABNORMAL HIGH
M-Spike, Mg/24 Hr: 33 mg/(24.h) — ABNORMAL HIGH
Total Protein, Urine-Ur/day: 451 mg/(24.h) — ABNORMAL HIGH (ref 30–150)
Total Protein, Urine: 19.6 mg/dL
Total Volume: 2300

## 2024-06-10 ENCOUNTER — Ambulatory Visit: Payer: Self-pay | Admitting: Hematology & Oncology

## 2024-06-10 DIAGNOSIS — M19071 Primary osteoarthritis, right ankle and foot: Secondary | ICD-10-CM | POA: Diagnosis not present

## 2024-06-10 DIAGNOSIS — L602 Onychogryphosis: Secondary | ICD-10-CM | POA: Diagnosis not present

## 2024-06-10 DIAGNOSIS — E1151 Type 2 diabetes mellitus with diabetic peripheral angiopathy without gangrene: Secondary | ICD-10-CM | POA: Diagnosis not present

## 2024-06-10 DIAGNOSIS — M19072 Primary osteoarthritis, left ankle and foot: Secondary | ICD-10-CM | POA: Diagnosis not present

## 2024-06-10 DIAGNOSIS — L84 Corns and callosities: Secondary | ICD-10-CM | POA: Diagnosis not present

## 2024-06-10 NOTE — Telephone Encounter (Signed)
 As noted below by Dr. Timmy, I called the patient and informed him that Dr. Timmy would like to review his labs with him. The Myeloma levels are getting too high. He verbalized understanding. LOS sent to scheduling.

## 2024-06-10 NOTE — Telephone Encounter (Signed)
-----   Message from Maude JONELLE Crease sent at 06/10/2024 10:39 AM EDT ----- Call - the myeloma levels are getting too high.   I need a 45 min appt with him to talk chemo.   This can be in 2-3 weeks.   Thanks!!  Jeralyn ----- Message ----- From: Rebecka, Lab In [place] Sent: 06/09/2024   3:37 PM EDT To: Maude JONELLE Crease, MD

## 2024-06-16 ENCOUNTER — Encounter: Payer: Self-pay | Admitting: Hematology & Oncology

## 2024-06-16 ENCOUNTER — Inpatient Hospital Stay

## 2024-06-16 ENCOUNTER — Inpatient Hospital Stay (HOSPITAL_BASED_OUTPATIENT_CLINIC_OR_DEPARTMENT_OTHER): Admitting: Hematology & Oncology

## 2024-06-16 ENCOUNTER — Other Ambulatory Visit: Payer: Self-pay

## 2024-06-16 VITALS — BP 113/83 | HR 73 | Temp 97.6°F | Resp 18 | Ht 75.0 in | Wt 202.0 lb

## 2024-06-16 DIAGNOSIS — C9000 Multiple myeloma not having achieved remission: Secondary | ICD-10-CM

## 2024-06-16 DIAGNOSIS — C9002 Multiple myeloma in relapse: Secondary | ICD-10-CM | POA: Diagnosis not present

## 2024-06-16 DIAGNOSIS — Z7985 Long-term (current) use of injectable non-insulin antidiabetic drugs: Secondary | ICD-10-CM | POA: Diagnosis not present

## 2024-06-16 DIAGNOSIS — R634 Abnormal weight loss: Secondary | ICD-10-CM | POA: Diagnosis not present

## 2024-06-16 DIAGNOSIS — D472 Monoclonal gammopathy: Secondary | ICD-10-CM | POA: Diagnosis not present

## 2024-06-16 DIAGNOSIS — E119 Type 2 diabetes mellitus without complications: Secondary | ICD-10-CM | POA: Diagnosis not present

## 2024-06-16 LAB — CBC WITH DIFFERENTIAL (CANCER CENTER ONLY)
Abs Immature Granulocytes: 0.03 10*3/uL (ref 0.00–0.07)
Basophils Absolute: 0.1 10*3/uL (ref 0.0–0.1)
Basophils Relative: 1 %
Eosinophils Absolute: 0.5 10*3/uL (ref 0.0–0.5)
Eosinophils Relative: 7 %
HCT: 38.7 % — ABNORMAL LOW (ref 39.0–52.0)
Hemoglobin: 12.7 g/dL — ABNORMAL LOW (ref 13.0–17.0)
Immature Granulocytes: 0 %
Lymphocytes Relative: 27 %
Lymphs Abs: 1.8 10*3/uL (ref 0.7–4.0)
MCH: 29.6 pg (ref 26.0–34.0)
MCHC: 32.8 g/dL (ref 30.0–36.0)
MCV: 90.2 fL (ref 80.0–100.0)
Monocytes Absolute: 0.6 10*3/uL (ref 0.1–1.0)
Monocytes Relative: 9 %
Neutro Abs: 3.7 10*3/uL (ref 1.7–7.7)
Neutrophils Relative %: 56 %
Platelet Count: 198 10*3/uL (ref 150–400)
RBC: 4.29 MIL/uL (ref 4.22–5.81)
RDW: 14.7 % (ref 11.5–15.5)
WBC Count: 6.7 10*3/uL (ref 4.0–10.5)
nRBC: 0 % (ref 0.0–0.2)

## 2024-06-16 LAB — CMP (CANCER CENTER ONLY)
ALT: 18 U/L (ref 0–44)
AST: 15 U/L (ref 15–41)
Albumin: 4 g/dL (ref 3.5–5.0)
Alkaline Phosphatase: 56 U/L (ref 38–126)
Anion gap: 6 (ref 5–15)
BUN: 30 mg/dL — ABNORMAL HIGH (ref 8–23)
CO2: 22 mmol/L (ref 22–32)
Calcium: 9.3 mg/dL (ref 8.9–10.3)
Chloride: 106 mmol/L (ref 98–111)
Creatinine: 1.92 mg/dL — ABNORMAL HIGH (ref 0.61–1.24)
GFR, Estimated: 35 mL/min — ABNORMAL LOW (ref >60)
Glucose, Bld: 109 mg/dL — ABNORMAL HIGH (ref 70–99)
Potassium: 4.5 mmol/L (ref 3.5–5.1)
Sodium: 134 mmol/L — ABNORMAL LOW (ref 135–145)
Total Bilirubin: 0.5 mg/dL (ref 0.0–1.2)
Total Protein: 8.2 g/dL — ABNORMAL HIGH (ref 6.5–8.1)

## 2024-06-16 LAB — LACTATE DEHYDROGENASE: LDH: 139 U/L (ref 98–192)

## 2024-06-16 MED ORDER — FAMCICLOVIR 250 MG PO TABS: 250.0000 mg | ORAL_TABLET | Freq: Two times a day (BID) | ORAL | 12 refills | Status: DC

## 2024-06-16 NOTE — Progress Notes (Signed)
 Hematology and Oncology Follow Up Visit  Erland Vivas Sr. [id number] November 10, 1946 78 y.o. 06/16/2024   Principle Diagnosis:  IgG kappa smoldering myeloma - progressive Iron  deficiency anemia -- Venofer  given on 03/2020  Current Therapy:   Observation     Interim History:  Mr.  Beza is back for followup.  Unfortunately, I think that we are going to have to start him on treatment.  I did do myeloma studies on him.  He does have progression of his monoclonal proteins.  His monoclonal spike is still low but progressive 1.8 g/dL.  His IgG level was 3000 mg/dL.  The kappa light chain was 15 mg/dL.  We did do a 24-hour urine on him.  This is what bothers me most.  His 24-hour urine shows that he has 226 mg/L of kappa light chain.  He has diabetes.  He has chronic renal insufficiency.  I would be worried that his kidneys are not going to tolerate a lot of light chain deposition.  He has not had a bone marrow biopsy for many years.  I think he is going need to have one done.  He has not had a PET scan.  I also think he is going to have 1 done.  He has a daughter is getting married in early August.  I think of regarding the treatment we can certainly do it after his daughter's wedding.  He feels okay.  He is still working.  He would like to keep working if possible.  He has had no fever.  He has had no cough or shortness of breath.  He has had no nausea or vomiting.  He has had no change in bowel or bladder habits.  There has been no bleeding.  Overall, I would say his performance status about ECOG 1.  .    Medications:  Current Outpatient Medications:    albuterol  (VENTOLIN  HFA) 108 (90 Base) MCG/ACT inhaler, Inhale 1-2 puffs into the lungs every 6 (six) hours as needed for wheezing or shortness of breath., Disp: 8.5 each, Rfl: 6   atorvastatin  (LIPITOR) 20 MG tablet, Take 1 tablet (20 mg total) by mouth daily., Disp: 90 tablet, Rfl: 3   carvedilol  (COREG ) 25 MG tablet, TAKE 1 AND 1/2  TABLETS (37.5 MG TOTAL) BY MOUTH 2 (TWO) TIMES DAILY WITH A MEAL. (Patient taking differently: Take 12.5 mg by mouth 2 (two) times daily with a meal. Take half a tablet twice a day), Disp: 270 tablet, Rfl: 2   DHA-EPA-Vitamin E (OMEGA-3 COMPLEX) 192-251-11 MG-MG-UNIT CAPS, Take 1 capsule by mouth every other day., Disp: , Rfl:    ENTRESTO  49-51 MG, TAKE 1 TABLET BY MOUTH TWICE A DAY, Disp: 180 tablet, Rfl: 1   ferrous sulfate  325 (65 FE) MG tablet, Take 325 mg by mouth every other day., Disp: , Rfl:    finasteride (PROSCAR) 5 MG tablet, Take 5 mg by mouth daily., Disp: , Rfl:    Ginkgo Biloba 40 MG TABS, Take 1 tablet by mouth daily. , Disp: , Rfl:    glucosamine-chondroitin 500-400 MG tablet, Take 1 tablet by mouth daily., Disp: , Rfl:    glucose blood (ONETOUCH ULTRA) test strip, Use to check blood lgucose once daily, Disp: 100 strip, Rfl: 3   JARDIANCE  25 MG TABS tablet, TAKE 1 TABLET BY MOUTH DAILY BEFORE BREAKFAST., Disp: 90 tablet, Rfl: 1   latanoprost  (XALATAN ) 0.005 % ophthalmic solution, Place 1 drop into both eyes at bedtime. , Disp: , Rfl:  Multiple Vitamin (MULTIVITAMIN WITH MINERALS) TABS, Take 1 tablet by mouth daily. , Disp: , Rfl:    OneTouch Delica Lancets 33G MISC, Use to check blood glucose once daily (Dx: E11.9), Disp: 100 each, Rfl: 3   Semaglutide ,0.25 or 0.5MG /DOS, (OZEMPIC , 0.25 OR 0.5 MG/DOSE,) 2 MG/3ML SOPN, Inject 0.5 mg into the skin once a week., Disp: , Rfl:    tamsulosin (FLOMAX) 0.4 MG CAPS capsule, Take 2 capsules (0.8 mg total) by mouth at bedtime., Disp: , Rfl:   Allergies:  Allergies  Allergen Reactions   Amlodipine  Swelling   Atorvastatin      Other Reaction(s): hard to focus   Rosuvastatin Other (See Comments)    Per pt unable to focus  Other Reaction(s): hard to focus    Past Medical History, Surgical history, Social history, and Family History were reviewed and updated.  Review of Systems: Review of Systems  Constitutional: Negative.   HENT:  Negative.    Eyes: Negative.   Respiratory: Negative.    Cardiovascular: Negative.   Gastrointestinal: Negative.   Genitourinary: Negative.   Musculoskeletal: Negative.   Skin: Negative.   Neurological: Negative.   Endo/Heme/Allergies: Negative.   Psychiatric/Behavioral: Negative.       Physical Exam: Vital signs are temperature of 97.6.  Pulse 73.  Blood pressure 138/83.  Weight is 202 pounds.   Physical Exam Vitals reviewed.  HENT:     Head: Normocephalic and atraumatic.   Eyes:     Pupils: Pupils are equal, round, and reactive to light.    Cardiovascular:     Rate and Rhythm: Normal rate and regular rhythm.     Heart sounds: Normal heart sounds.  Pulmonary:     Effort: Pulmonary effort is normal.     Breath sounds: Normal breath sounds.  Abdominal:     General: Bowel sounds are normal.     Palpations: Abdomen is soft.   Musculoskeletal:        General: No tenderness or deformity. Normal range of motion.     Cervical back: Normal range of motion.  Lymphadenopathy:     Cervical: No cervical adenopathy.   Skin:    General: Skin is warm and dry.     Findings: No erythema or rash.   Neurological:     Mental Status: He is alert and oriented to person, place, and time.   Psychiatric:        Behavior: Behavior normal.        Thought Content: Thought content normal.        Judgment: Judgment normal.      Lab Results  Component Value Date   WBC 6.7 06/16/2024   HGB 12.7 (L) 06/16/2024   HCT 38.7 (L) 06/16/2024   MCV 90.2 06/16/2024   PLT 198 06/16/2024     Chemistry      Component Value Date/Time   NA 134 (L) 06/16/2024 1351   NA 133 (L) 01/12/2020 1015   NA 142 10/03/2017 0910   NA 137 04/03/2017 0852   K 4.5 06/16/2024 1351   K 3.9 10/03/2017 0910   K 4.2 04/03/2017 0852   CL 106 06/16/2024 1351   CL 103 10/03/2017 0910   CL 108 (H) 05/15/2013 0912   CO2 22 06/16/2024 1351   CO2 28 10/03/2017 0910   CO2 26 04/03/2017 0852   BUN 30 (H)  06/16/2024 1351   BUN 25 01/12/2020 1015   BUN 13 10/03/2017 0910   BUN 16.3 04/03/2017 0852   CREATININE  1.92 (H) 06/16/2024 1351   CREATININE 1.07 11/16/2017 1437   CREATININE 1.0 04/03/2017 0852      Component Value Date/Time   CALCIUM  9.3 06/16/2024 1351   CALCIUM  9.3 10/03/2017 0910   CALCIUM  9.0 04/03/2017 0852   ALKPHOS 56 06/16/2024 1351   ALKPHOS 51 10/03/2017 0910   ALKPHOS 46 04/03/2017 0852   AST 15 06/16/2024 1351   AST 18 04/03/2017 0852   ALT 18 06/16/2024 1351   ALT 34 10/03/2017 0910   ALT 19 04/03/2017 0852   BILITOT 0.5 06/16/2024 1351   BILITOT 0.38 04/03/2017 0852     Impression and Plan: Mr. Thieme is 78 year old gentleman with IgG kappa smoldering myeloma.  I have been watching him for several years.  Again, his monoclonal studies have been slowly rising.  I think we will have to get treatment started.  Again, we need another bone marrow biopsy on him.  This way we get FISH panel and also cytogenetics.  I think he would be good with Faspro/Velcade/Revlimid /Decadron .  I will Delva give him a lower dose of Decadron .  He still is in great shape.  I think he could tolerate treatment.  I think he could still work with therapy.  I had a long talk with he and his wife.  I explained why I thought he would benefit from treatment.  They understand.  Again, we can certainly get things started after his daughter's wedding.  In the meantime, we will see what his bone marrow biopsy shows and what the PET scan shows.  I know that I have been following him for many years.  I knew that at some point, we would have to start him on treatment.     I will plan to see him back when treatment starts.    Maude JONELLE Crease, MD 6/30/20255:36 PM

## 2024-06-16 NOTE — Progress Notes (Signed)
 START ON PATHWAY REGIMEN - Multiple Myeloma and Other Plasma Cell Dyscrasias     Cycles 1 and 2: A cycle is every 21 days:     Lenalidomide       Dexamethasone       Bortezomib      Daratumumab and hyaluronidase-fihj    Cycles 3 through 8: A cycle is every 21 days:     Lenalidomide       Dexamethasone       Bortezomib      Daratumumab and hyaluronidase-fihj    Cycles 9 and beyond: A cycle is every 28 days:     Lenalidomide       Dexamethasone       Daratumumab and hyaluronidase-fihj   **Always confirm dose/schedule in your pharmacy ordering system**  Patient Characteristics: Multiple Myeloma, Newly Diagnosed, Transplant Ineligible or Deferred, Standard Risk Disease Classification: Multiple Myeloma Therapeutic Status: Newly Diagnosed R2-ISS Staging: III Is Patient Eligible for Transplant<= Transplant Ineligible or Deferred Risk Status: Standard Risk Intent of Therapy: Non-Curative / Palliative Intent, Discussed with Patient

## 2024-06-17 ENCOUNTER — Encounter: Payer: Self-pay | Admitting: Hematology & Oncology

## 2024-06-17 ENCOUNTER — Other Ambulatory Visit: Payer: Self-pay

## 2024-06-17 LAB — IGG, IGA, IGM
IgA: 234 mg/dL (ref 61–437)
IgG (Immunoglobin G), Serum: 2991 mg/dL — ABNORMAL HIGH (ref 603–1613)
IgM (Immunoglobulin M), Srm: 22 mg/dL (ref 15–143)

## 2024-06-17 LAB — KAPPA/LAMBDA LIGHT CHAINS
Kappa free light chain: 147.2 mg/L — ABNORMAL HIGH (ref 3.3–19.4)
Kappa, lambda light chain ratio: 7.01 — ABNORMAL HIGH (ref 0.26–1.65)
Lambda free light chains: 21 mg/L (ref 5.7–26.3)

## 2024-06-18 ENCOUNTER — Other Ambulatory Visit (HOSPITAL_COMMUNITY): Payer: Self-pay

## 2024-06-18 ENCOUNTER — Other Ambulatory Visit: Payer: Self-pay

## 2024-06-18 MED ORDER — LENALIDOMIDE 15 MG PO CAPS
15.0000 mg | ORAL_CAPSULE | Freq: Every day | ORAL | 4 refills | Status: DC
  Filled 2024-06-18: qty 21, 21d supply, fill #0

## 2024-06-18 NOTE — Addendum Note (Signed)
 Addended by: TIMMY MAUDE SAUNDERS on: 06/18/2024 04:45 PM   Modules accepted: Orders

## 2024-06-19 ENCOUNTER — Telehealth: Payer: Self-pay | Admitting: Pharmacy Technician

## 2024-06-19 ENCOUNTER — Telehealth: Payer: Self-pay | Admitting: Pharmacist

## 2024-06-19 ENCOUNTER — Other Ambulatory Visit: Payer: Self-pay

## 2024-06-19 ENCOUNTER — Other Ambulatory Visit (HOSPITAL_COMMUNITY): Payer: Self-pay

## 2024-06-19 ENCOUNTER — Encounter: Payer: Self-pay | Admitting: Family

## 2024-06-19 ENCOUNTER — Encounter: Payer: Self-pay | Admitting: Hematology & Oncology

## 2024-06-19 ENCOUNTER — Other Ambulatory Visit: Payer: Self-pay | Admitting: *Deleted

## 2024-06-19 ENCOUNTER — Other Ambulatory Visit: Payer: Self-pay | Admitting: Pharmacist

## 2024-06-19 DIAGNOSIS — C9000 Multiple myeloma not having achieved remission: Secondary | ICD-10-CM

## 2024-06-19 LAB — PROTEIN ELECTROPHORESIS, SERUM, WITH REFLEX
A/G Ratio: 0.8 (ref 0.7–1.7)
Albumin ELP: 3.6 g/dL (ref 2.9–4.4)
Alpha-1-Globulin: 0.2 g/dL (ref 0.0–0.4)
Alpha-2-Globulin: 0.8 g/dL (ref 0.4–1.0)
Beta Globulin: 0.9 g/dL (ref 0.7–1.3)
Gamma Globulin: 2.4 g/dL — ABNORMAL HIGH (ref 0.4–1.8)
Globulin, Total: 4.3 g/dL — ABNORMAL HIGH (ref 2.2–3.9)
M-Spike, %: 1.8 g/dL — ABNORMAL HIGH
SPEP Interpretation: 0
Total Protein ELP: 7.9 g/dL (ref 6.0–8.5)

## 2024-06-19 LAB — IMMUNOFIXATION REFLEX, SERUM
IgA: 231 mg/dL (ref 61–437)
IgG (Immunoglobin G), Serum: 2994 mg/dL — ABNORMAL HIGH (ref 603–1613)
IgM (Immunoglobulin M), Srm: 29 mg/dL (ref 15–143)

## 2024-06-19 MED ORDER — LENALIDOMIDE 10 MG PO CAPS: 10.0000 mg | ORAL_CAPSULE | Freq: Every day | ORAL | 0 refills | Status: DC

## 2024-06-19 MED ORDER — LENALIDOMIDE 10 MG PO CAPS
10.0000 mg | ORAL_CAPSULE | Freq: Every day | ORAL | 0 refills | Status: DC
Start: 2024-06-19 — End: 2024-07-16

## 2024-06-19 NOTE — Telephone Encounter (Signed)
 Oral Oncology Patient Advocate Encounter  After completing a benefits investigation, prior authorization for lenalidomide  is not required at this time through [employer].  Patient's copay is $0.     Guyla Bless (Patty) Chet Burnet, CPhT  [facility], [place], [place], [place] Oral Chemotherapy Patient Advocate Phone: [phone]  Fax: [phone]

## 2024-06-19 NOTE — Telephone Encounter (Addendum)
 Oral Oncology Pharmacist Encounter  Received new prescription for Revlimid  (lenalidomide ) for the treatment of multiple myeloma in conjunction with daratumumab, boretezomib and dexamethasone , planned duration until disease progression or unacceptable drug toxicity.  CBC w/ Diff and CMP from 06/16/24 assessed, patient with Scr of 1.92 mg/dL (CrCl ~62.0 mL/min) - per package insert, starting dose of Revlimid  is recommended to be 10 mg/d x 2 cycles and then increase to 15 mg/d with cycle 3 if tolerated. Will confirm if MD would like to further dose reduce for first 2 cycles. Prescription dose and frequency assessed for appropriateness.  ADDENDUM 06/19/2024 12:18 PM Per Dr. Timmy via staff message - ok to dose reduce patient to 10 mg given renal impairment. Orders updated to reflect change. I have updated the dose change with the lenalidomide  REMS program.   Current medication list in Epic reviewed, no relevant/significant DDIs with Revlimid  identified.  Evaluated chart and no patient barriers to medication adherence noted.   Oral Oncology Clinic will continue to follow for insurance authorization, copayment issues, initial counseling and start date.  Asberry Macintosh, PharmD, BCPS, BCOP Hematology/Oncology Clinical Pharmacist [facility] and [facility] Oral Chemotherapy Navigation Clinics [phone] 06/19/2024 9:00 AM

## 2024-06-23 ENCOUNTER — Encounter: Payer: Self-pay | Admitting: *Deleted

## 2024-06-23 ENCOUNTER — Telehealth: Payer: Self-pay | Admitting: *Deleted

## 2024-06-23 NOTE — Telephone Encounter (Signed)
 This nurse called patient to discuss Revlimid . Patient stated,  I have been on it before when I saw Dr. Twana. I don't remember every side effect. I reviewed the patient-physician agreement form from [employer]. Also, I printed a copy with all the instructions, websites, phone numbers, and the patient agreement form so he will have his own copy. I mailed them to his home with address being verified. All questions answered at this time. I instructed him to call the office or send a MyChart message if he had any other concerns or questions. His Revlimid  should come from [facility]. He verbalized understanding.

## 2024-06-24 ENCOUNTER — Encounter: Payer: Self-pay | Admitting: Family Medicine

## 2024-06-24 ENCOUNTER — Other Ambulatory Visit: Payer: Self-pay | Admitting: Family Medicine

## 2024-06-24 DIAGNOSIS — M175 Other unilateral secondary osteoarthritis of knee: Secondary | ICD-10-CM | POA: Diagnosis not present

## 2024-06-24 DIAGNOSIS — M4206 Juvenile osteochondrosis of spine, lumbar region: Secondary | ICD-10-CM | POA: Diagnosis not present

## 2024-06-24 MED ORDER — DOXYCYCLINE HYCLATE 100 MG PO CAPS
ORAL_CAPSULE | ORAL | 0 refills | Status: DC
Start: 2024-06-24 — End: 2024-08-13

## 2024-06-24 NOTE — Telephone Encounter (Signed)
 Oral Chemotherapy Pharmacist Encounter   Notified by [facility] that patient's Revlimid  (lenalidomide ) is scheduled to deliver to his home on 06/25/24. Informed patient he will not start taking this until his first day of treatment currently scheduled for 07/29/24.  Patient expressed understanding and will hold onto Revlimid  until then. I will touch base with patient closer to start date for initial Revlimid  counseling.   Asberry Macintosh, PharmD, BCPS, BCOP Hematology/Oncology Clinical Pharmacist [facility] and [facility] Oral Chemotherapy Navigation Clinics [phone] 06/24/2024 2:54 PM

## 2024-06-25 ENCOUNTER — Encounter: Payer: Self-pay | Admitting: Family Medicine

## 2024-06-25 ENCOUNTER — Ambulatory Visit (INDEPENDENT_AMBULATORY_CARE_PROVIDER_SITE_OTHER): Admitting: Pharmacist

## 2024-06-25 DIAGNOSIS — Z7985 Long-term (current) use of injectable non-insulin antidiabetic drugs: Secondary | ICD-10-CM

## 2024-06-25 DIAGNOSIS — E1122 Type 2 diabetes mellitus with diabetic chronic kidney disease: Secondary | ICD-10-CM

## 2024-06-25 NOTE — Progress Notes (Signed)
 Pharmacy Note  06/25/2024 Name: Michael Alsop Sr. MRN: [id number] DOB: [birth year]  Subjective: Michael Edmonds Sr. is a 78 y.o. year old male who is a primary care patient of [name], Harlene BROCKS, MD. Clinical Pharmacist Practitioner referral was placed to assist with medication, CHF and diabetes management.     Medication Management:  Patient has been getting Entresto  and Jardiance  thru the [employer] Cardiomyopathy fund - approved thru 06/17/2025  He has no cost related to Entresto  and Jardiance  at this time.   He was approved for [employer] medication assistance program for Ozempic  01/09/2024 thru 12/17/2024    Type 2 DM -  Current therapy - Jardiance  25mg  daily and Ozempic  0.5mg  weekly.  Checking blood glucose at home 90 to 100 in afternoon; 115 to 130's in morning / fasting  Wt Readings from Last 3 Encounters:  06/16/24 202 lb (91.6 kg)  06/02/24 201 lb (91.2 kg)  02/25/24 201 lb 9.6 oz (91.4 kg)    Denies hypoglycemia and signs of hyperglycemia.  He reports his weight has been stable over the last 3 months He is exercising about 3 to 4 days per week- treadmill and lifting weights. He also is active around his home.    Objective: Review of patient status, including review of consultants reports, laboratory and other test data, was performed as part of comprehensive evaluation and provision of chronic care management services.   BP Readings from Last 3 Encounters:  06/16/24 113/83  06/02/24 (!) 141/90  02/25/24 120/72     Lab Results  Component Value Date   CREATININE 1.92 (H) 06/16/2024   CREATININE 1.75 (H) 06/02/2024   CREATININE 1.75 (H) 02/25/2024    Lab Results  Component Value Date   HGBA1C 9.4 (H) 02/25/2024       Component Value Date/Time   CHOL 116 02/25/2024 1426   CHOL 151 12/26/2017 0847   TRIG 143.0 02/25/2024 1426   HDL 27.70 (L) 02/25/2024 1426   HDL 34 (L) 12/26/2017 0847   CHOLHDL 4 02/25/2024 1426   VLDL 28.6 02/25/2024 1426   LDLCALC 59  02/25/2024 1426   LDLCALC 104 (H) 12/26/2017 0847     Clinical ASCVD: Yes  The ASCVD Risk score (Arnett DK, et al., 2019) failed to calculate for the following reasons:   The valid total cholesterol range is 130 to 320 mg/dL    BP Readings from Last 3 Encounters:  06/16/24 113/83  06/02/24 (!) 141/90  02/25/24 120/72    Allergies  Allergen Reactions   Amlodipine  Swelling   Atorvastatin      Other Reaction(s): hard to focus   Rosuvastatin Other (See Comments)    Per pt unable to focus  Other Reaction(s): hard to focus    Medications Reviewed Today   Medications were not reviewed in this encounter     Patient Active Problem List   Diagnosis Date Noted   Chronic kidney disease (CKD), active medical management without dialysis 10/09/2019   Iron  deficiency anemia due to chronic blood loss 10/02/2019   Chronic systolic (congestive) heart failure (HCC) 01/04/2018   Pacemaker 12/08/2015   Mobitz type 2 second degree heart block 08/08/2015   Right bundle branch block 12/16/2014   Syncope 12/16/2014   Proteinuria due to type 2 diabetes mellitus (HCC) 11/12/2014   Encounter for therapeutic drug monitoring 03/13/2013   Multiple myeloma (HCC) 02/15/2013   Edema 01/07/2013   Diabetes mellitus due to underlying condition with other diabetic kidney complication (HCC)    Hyperlipidemia  Hypertension    MGUS (monoclonal gammopathy of unknown significance)    GERD (gastroesophageal reflux disease)       Assessment / Plan: Type 2 DM- A1c not at goal but per patient his blood glucose has improved at home; only checking fasting blood glucose.  Continue current therapy - Ozempic  0.5mg  weekly and Jardiance  25mg  daily.  Reviewed home blood glucose goals. Patient will check 1 or 2 times per day at varying times of day. If blood glucose > 180 he will contact office.   Medication Assistance:   Updated [employer] Grant for cardiomyopathy - will cover Jardiance  and Entresto  cost.    Amount of grant $4500. Good thru 06/17/2025  Approved for Novo Nordisk medication assistance program for Ozempic  thru 12/17/2024. He reports he has 3 pens of Ozempic . Per [employer] he is due to have refilled around July 20th, 2025 but will likely not need until September.  Reviewed medication list and discuss adherence.    Follow up planned in 6 to 8 weeks  Madelin Ray, PharmD Clinical Pharmacist [facility] Primary [facility]  - [facility] [phone]

## 2024-07-03 ENCOUNTER — Ambulatory Visit: Payer: Medicare Other

## 2024-07-03 DIAGNOSIS — I441 Atrioventricular block, second degree: Secondary | ICD-10-CM | POA: Diagnosis not present

## 2024-07-03 LAB — CUP PACEART REMOTE DEVICE CHECK
Battery Remaining Longevity: 19 mo
Battery Remaining Percentage: 20 %
Battery Voltage: 2.92 V
Brady Statistic AP VP Percent: 23 %
Brady Statistic AP VS Percent: 1 %
Brady Statistic AS VP Percent: 74 %
Brady Statistic AS VS Percent: 1.7 %
Brady Statistic RA Percent Paced: 22 %
Date Time Interrogation Session: 20250717044052
Implantable Lead Connection Status: 753985
Implantable Lead Connection Status: 753985
Implantable Lead Connection Status: 753985
Implantable Lead Implant Date: 20160822
Implantable Lead Implant Date: 20160822
Implantable Lead Implant Date: 20190118
Implantable Lead Location: 753858
Implantable Lead Location: 753859
Implantable Lead Location: 753860
Implantable Pulse Generator Implant Date: 20190118
Lead Channel Impedance Value: 1200 Ohm
Lead Channel Impedance Value: 390 Ohm
Lead Channel Impedance Value: 540 Ohm
Lead Channel Pacing Threshold Amplitude: 0.5 V
Lead Channel Pacing Threshold Amplitude: 1 V
Lead Channel Pacing Threshold Amplitude: 1.5 V
Lead Channel Pacing Threshold Pulse Width: 0.5 ms
Lead Channel Pacing Threshold Pulse Width: 0.5 ms
Lead Channel Pacing Threshold Pulse Width: 0.5 ms
Lead Channel Sensing Intrinsic Amplitude: 1.4 mV
Lead Channel Sensing Intrinsic Amplitude: 12 mV
Lead Channel Setting Pacing Amplitude: 1.5 V
Lead Channel Setting Pacing Amplitude: 2 V
Lead Channel Setting Pacing Amplitude: 2.5 V
Lead Channel Setting Pacing Pulse Width: 0.5 ms
Lead Channel Setting Pacing Pulse Width: 0.5 ms
Lead Channel Setting Sensing Sensitivity: 4 mV
Pulse Gen Model: 3262
Pulse Gen Serial Number: 8983138

## 2024-07-07 ENCOUNTER — Encounter (HOSPITAL_COMMUNITY)
Admission: RE | Admit: 2024-07-07 | Discharge: 2024-07-07 | Disposition: A | Discharge status: Home / Self Care | Source: Ambulatory Visit | Attending: Hematology & Oncology | Admitting: Hematology & Oncology

## 2024-07-07 ENCOUNTER — Ambulatory Visit: Payer: Self-pay | Admitting: Internal Medicine

## 2024-07-07 DIAGNOSIS — C9000 Multiple myeloma not having achieved remission: Secondary | ICD-10-CM | POA: Insufficient documentation

## 2024-07-07 LAB — GLUCOSE, CAPILLARY: Glucose-Capillary: 149 mg/dL — ABNORMAL HIGH (ref 70–99)

## 2024-07-07 MED ORDER — FLUDEOXYGLUCOSE F - 18 (FDG) INJECTION
10.1100 | Freq: Once | INTRAVENOUS | Status: AC | PRN
  Administered 2024-07-07: 10.11 via INTRAVENOUS

## 2024-07-08 ENCOUNTER — Encounter: Payer: Self-pay | Admitting: *Deleted

## 2024-07-08 ENCOUNTER — Ambulatory Visit: Payer: Self-pay | Admitting: Hematology & Oncology

## 2024-07-09 DIAGNOSIS — N1832 Chronic kidney disease, stage 3b: Secondary | ICD-10-CM | POA: Diagnosis not present

## 2024-07-14 ENCOUNTER — Other Ambulatory Visit: Payer: Self-pay | Admitting: Radiology

## 2024-07-14 DIAGNOSIS — N1832 Chronic kidney disease, stage 3b: Secondary | ICD-10-CM | POA: Diagnosis not present

## 2024-07-14 DIAGNOSIS — D500 Iron deficiency anemia secondary to blood loss (chronic): Secondary | ICD-10-CM

## 2024-07-14 DIAGNOSIS — I5022 Chronic systolic (congestive) heart failure: Secondary | ICD-10-CM | POA: Diagnosis not present

## 2024-07-14 DIAGNOSIS — E872 Acidosis, unspecified: Secondary | ICD-10-CM | POA: Diagnosis not present

## 2024-07-14 DIAGNOSIS — C9000 Multiple myeloma not having achieved remission: Secondary | ICD-10-CM | POA: Diagnosis not present

## 2024-07-14 DIAGNOSIS — E1122 Type 2 diabetes mellitus with diabetic chronic kidney disease: Secondary | ICD-10-CM | POA: Diagnosis not present

## 2024-07-14 DIAGNOSIS — I129 Hypertensive chronic kidney disease with stage 1 through stage 4 chronic kidney disease, or unspecified chronic kidney disease: Secondary | ICD-10-CM | POA: Diagnosis not present

## 2024-07-14 NOTE — H&P (Signed)
 Chief Complaint: Multiple Myeloma  Referring Provider(s): Ennever  Supervising Physician: Jennefer Rover  Patient Status: [facility] - Out-pt  History of Present Illness: Michael Mcniel Sr. is a 78 y.o. male with long standing history of smoldering myeloma.  He has been under the care of Dr. Timmy for several years.  We are asked to perform a bone marrow biopsy.  He is NPO. No nausea/vomiting. No Fever/chills. ROS negative.   Patient is Full Code  Past Medical History:  Diagnosis Date   Anemia    Cataract    Chronic systolic (congestive) heart failure (HCC)    Diabetes mellitus    GERD (gastroesophageal reflux disease)    Hyperlipidemia    Hypertension    Iron  deficiency anemia due to chronic blood loss 10/02/2019   MGUS (monoclonal gammopathy of unknown significance)    Multiple myeloma (HCC) 02/2013   normal cytogenetics and FISH panel on 03/11/2013.    Osteoarthritis    Presence of permanent cardiac pacemaker    Right bundle branch block    Syncope     Past Surgical History:  Procedure Laterality Date   BIV UPGRADE  01/04/2018   BIV UPGRADE N/A 01/04/2018   Procedure: BIVP UPGRADE;  Surgeon: Waddell Danelle ORN, MD;  Location: [facility] INVASIVE CV LAB;  Service: Cardiovascular;  Laterality: N/A;   EP IMPLANTABLE DEVICE N/A 08/09/2015   Procedure: Pacemaker Implant;  Surgeon: Danelle ORN Waddell, MD;  Location: [facility] INVASIVE CV LAB;  Service: Cardiovascular;  Laterality: N/A;   EYE SURGERY      Allergies: Amlodipine , Atorvastatin , and Rosuvastatin  Medications: Prior to Admission medications   Medication Sig Start Date End Date Taking? Authorizing Provider  albuterol  (VENTOLIN  HFA) 108 (90 Base) MCG/ACT inhaler Inhale 1-2 puffs into the lungs every 6 (six) hours as needed for wheezing or shortness of breath. 02/25/24   [name], Harlene BROCKS, MD  atorvastatin  (LIPITOR) 20 MG tablet Take 1 tablet (20 mg total) by mouth daily. 02/25/24   [name], Harlene BROCKS, MD  carvedilol  (COREG ) 25  MG tablet TAKE 1 AND 1/2 TABLETS (37.5 MG TOTAL) BY MOUTH 2 (TWO) TIMES DAILY WITH A MEAL. Patient taking differently: Take 12.5 mg by mouth 2 (two) times daily with a meal. Take half a tablet twice a day 04/22/24   Waddell Danelle ORN, MD  DHA-EPA-Vitamin E (OMEGA-3 COMPLEX) [phone] MG-MG-UNIT CAPS Take 1 capsule by mouth every other day.    [provider]  doxycycline  (VIBRAMYCIN ) 100 MG capsule Take 200 mg by mouth once for tick bite 06/24/24   [name], Harlene BROCKS, MD  ENTRESTO  49-51 MG TAKE 1 TABLET BY MOUTH TWICE A DAY 02/12/24   [name], Harlene BROCKS, MD  famciclovir  (FAMVIR ) 250 MG tablet Take 1 tablet (250 mg total) by mouth 2 (two) times daily. Please start this 3 days prior to starting chemotherapy in August. Patient not taking: Reported on 06/25/2024 06/16/24   Timmy Maude SAUNDERS, MD  ferrous sulfate  325 (65 FE) MG tablet Take 325 mg by mouth every other day.    [provider]  finasteride (PROSCAR) 5 MG tablet Take 5 mg by mouth daily.    [provider]  Ginkgo Biloba 40 MG TABS Take 1 tablet by mouth daily.     [provider]  glucosamine-chondroitin 500-400 MG tablet Take 1 tablet by mouth daily.    [provider]  glucose blood (ONETOUCH ULTRA) test strip Use to check blood lgucose once daily 05/21/24   [name], Jessica C,  MD  JARDIANCE  25 MG TABS tablet TAKE 1 TABLET BY MOUTH DAILY BEFORE BREAKFAST. 04/07/24   [name], Harlene BROCKS, MD  latanoprost  (XALATAN ) 0.005 % ophthalmic solution Place 1 drop into both eyes at bedtime.  03/17/17   [provider]  lenalidomide  (REVLIMID ) 10 MG capsule Take 1 capsule (10 mg total) by mouth daily. Take for 21 days on, 7 days off. Repeat every 28 days. [employer] Auth # [id number]; Date Obtained 06/18/24 Patient not taking: Reported on 06/25/2024 06/19/24   Ennever, Peter R, MD  Multiple Vitamin (MULTIVITAMIN WITH MINERALS) TABS Take 1 tablet by mouth daily.     [provider]  OneTouch Delica Lancets 33G MISC  Use to check blood glucose once daily (Dx: E11.9) 05/21/24   [name], Harlene BROCKS, MD  Semaglutide ,0.25 or 0.5MG /DOS, (OZEMPIC , 0.25 OR 0.5 MG/DOSE,) 2 MG/3ML SOPN Inject 0.5 mg into the skin once a week. 10/10/23   [name], Jessica C, MD  tamsulosin (FLOMAX) 0.4 MG CAPS capsule Take 2 capsules (0.8 mg total) by mouth at bedtime. 12/31/23   [name], Harlene BROCKS, MD     Family History  Problem Relation Age of Onset   Hypertension Mother    Cancer Mother    Stroke Mother    Hypertension Sister    Hypertension Brother    Hypertension Maternal Grandmother     Social History   Socioeconomic History   Marital status: Married    Spouse name: Not on file   Number of children: 3   Years of education: Not on file   Highest education level: Associate degree: occupational, Scientist, product/process development, or vocational program  Occupational History   Not on file  Tobacco Use   Smoking status: Former    Current packs/day: 0.00    Average packs/day: 2.0 packs/day for 35.0 years (70.1 ttl pk-yrs)    Types: Cigarettes    Start date: 03/26/1963    Quit date: 12/18/1997    Years since quitting: 26.5   Smokeless tobacco: Never   Tobacco comments:    quit 15 years  Vaping Use   Vaping status: Never Used  Substance and Sexual Activity   Alcohol use: No    Alcohol/week: 0.0 standard drinks of alcohol   Drug use: No   Sexual activity: Yes    Birth control/protection: None  Other Topics Concern   Not on file  Social History Narrative   Married   Building Maintenance   Social Drivers of Health   Financial Resource Strain: Low Risk  (02/24/2024)   Overall Financial Resource Strain (CARDIA)    Difficulty of Paying Living Expenses: Not very hard  Food Insecurity: No Food Insecurity (02/24/2024)   Hunger Vital Sign    Worried About Running Out of Food in the Last Year: Never true    Ran Out of Food in the Last Year: Never true  Transportation Needs: No Transportation Needs (02/24/2024)   PRAPARE - Scientist, research (physical sciences) (Medical): No    Lack of Transportation (Non-Medical): No  Physical Activity: Unknown (02/24/2024)   Exercise Vital Sign    Days of Exercise per Week: 6 days    Minutes of Exercise per Session: Patient declined  Recent Concern: Physical Activity - Insufficiently Active (01/24/2024)   Exercise Vital Sign    Days of Exercise per Week: 4 days    Minutes of Exercise per Session: 30 min  Stress: No Stress Concern Present (02/24/2024)   [employer] of Occupational Health - Occupational Stress Questionnaire  Feeling of Stress : Not at all  Social Connections: Socially Integrated (02/24/2024)   Social Connection and Isolation Panel    Frequency of Communication with Friends and Family: More than three times a week    Frequency of Social Gatherings with Friends and Family: Once a week    Attends Religious Services: More than 4 times per year    Active Member of [employer] or Organizations: Yes    Attends Engineer, structural: More than 4 times per year    Marital Status: Married     Review of Systems: A 12 point ROS discussed and pertinent positives are indicated in the HPI above.  All other systems are negative.  Review of Systems  Vital Signs: There were no vitals taken for this visit.  Advance Care Plan: The advanced care place/surrogate decision maker was discussed at the time of visit and the patient did not wish to discuss or was not able to name a surrogate decision maker or provide an advance care plan.  Physical Exam Vitals reviewed.  Constitutional:      Appearance: Normal appearance.  HENT:     Head: Normocephalic and atraumatic.  Eyes:     Extraocular Movements: Extraocular movements intact.  Cardiovascular:     Rate and Rhythm: Normal rate and regular rhythm.  Pulmonary:     Effort: Pulmonary effort is normal. No respiratory distress.     Breath sounds: Normal breath sounds.  Abdominal:     General: There is no distension.     Palpations: Abdomen  is soft.     Tenderness: There is no abdominal tenderness.  Musculoskeletal:        General: Normal range of motion.     Cervical back: Normal range of motion.  Skin:    General: Skin is warm and dry.  Neurological:     General: No focal deficit present.     Mental Status: He is alert and oriented to person, place, and time.  Psychiatric:        Mood and Affect: Mood normal.        Behavior: Behavior normal.        Thought Content: Thought content normal.        Judgment: Judgment normal.     Imaging: CLINICAL DATA:  Initial treatment strategy for myeloma.   EXAM: NUCLEAR MEDICINE PET WHOLE BODY   TECHNIQUE: 10.11 mCi F-18 FDG was injected intravenously. Full-ring PET imaging was performed from the head to foot after the radiotracer. CT data was obtained and used for attenuation correction and anatomic localization.   Fasting blood glucose: 149 mg/dl   COMPARISON:  Noncontrast chest CT 07/03/2023 and older.   FINDINGS: Mediastinal blood pool activity: SUV max 2.9   HEAD/NECK: There is symmetric uptake of the intracranial compartment. No specific abnormal uptake in the neck including along lymph node change of the submandibular, posterior triangle or internal jugular regions.   Incidental CT findings: Preserved brain volume and gray-white matter differentiation. No midline shift or hydrocephalus. Diffuse opacification of the right maxillary sinus with possible air-fluid level. Again no abnormal uptake but please correlate for any symptoms including for sinusitis. Mastoid air cells are clear. Streak artifact related to the patient's dental hardware. Scattered vascular calcifications. The parotid glands, submandibular glands and thyroid  gland are grossly preserved.   CHEST: No hypermetabolic mediastinal or hilar nodes. No suspicious pulmonary nodules on the CT scan.   Incidental CT findings: Left upper chest battery pack for pacemaker. Heart is  nonenlarged.  Thoracic aorta overall has a normal course and caliber. Minimal atherosclerotic disease but more prominent coronary artery calcifications. Slightly patulous thoracic esophagus. Breathing motion. No consolidation, pneumothorax or effusion. Few scattered lung nodules are again seen but similar to previous including tree-in-bud like areas along the posteroinferior right upper lobe.   ABDOMEN/PELVIS: No abnormal hypermetabolic activity within the liver, pancreas, adrenal glands, or spleen. No hypermetabolic lymph nodes in the abdomen or pelvis.   Incidental CT findings: On limited noncontrast attenuation correction CT, grossly the liver, spleen, adrenal glands are unremarkable. Gallbladder contains a dependent stone and is nondilated. Atrophy of the pancreas. Upper pole Bosniak 1 appearing renal cysts measuring 2.1 cm with Hounsfield unit less than 0. Lesion is photopenic. Renal hilar calcification on the right is likely vascular. No definite renal or ureteral stone. Preserved contour to the bladder. There is an enlarged prostate with mass effect along the base of the bladder. No abnormal uptake. Please correlate for patient's PSA and any signs of BPH. Large bowel has a normal course and caliber with diffuse colonic stool. Scattered colonic diverticula. Normal appendix. Stomach and small bowel are nondilated. Diffuse vascular calcifications identified including the aorta and branch vessels. Normal caliber aorta and IVC. Small bilateral fat containing inguinal hernias.   Skeleton/extremities: There is some uptake with maximum SUV value 2.8 corresponding to the lateral aspect of the left fifth rib. No underlying lesion or fracture seen by the CT scan in this location. No soft tissue areas of abnormal uptake identified this time.   Diffuse vascular calcifications seen along the lower extremities. Focal lipoma identified of the left shoulder musculature posteriorly. Multifocal areas of  degenerative changes. Multifocal areas of fatty muscle atrophy.   IMPRESSION: No soft tissue or nodal areas of abnormal uptake identified at this time.   There is mild uptake along the lateral aspect of the left fifth rib without underlying lesion or fracture. This is of uncertain etiology and significance. Recommend follow-up.   Diffuse vascular calcifications.  Pacemaker.   Gallstone.   Diffuse colonic stool in some scattered diverticula. No bowel obstruction.   Opacity with some fluid along the right maxillary sinus without uptake. Please correlate for any symptoms and history.     Electronically Signed   By: Ranell Bring M.D.   On: 07/08/2024 14:32  Labs:  CBC: Recent Labs    06/02/24 0927 06/16/24 1351  WBC 5.7 6.7  HGB 13.7 12.7*  HCT 41.9 38.7*  PLT 193 198    COAGS: No results for input(s): INR, APTT in the last 8760 hours.  BMP: Recent Labs    08/22/23 1326 02/25/24 1426 06/02/24 0927 06/16/24 1351  NA 136 134* 136 134*  K 4.5 4.4 4.4 4.5  CL 103 102 103 106  CO2 25 26 26 22   GLUCOSE 101* 162* 147* 109*  BUN 29* 25* 29* 30*  CALCIUM  9.3 9.1 9.6 9.3  CREATININE 1.81* 1.75* 1.75* 1.92*  GFRNONAA  --   --  39* 35*    LIVER FUNCTION TESTS: Recent Labs    02/25/24 1426 06/02/24 0927 06/16/24 1351  BILITOT 0.5 0.4 0.5  AST 23 17 15   ALT 28 19 18   ALKPHOS 74 60 56  PROT 7.7 8.5* 8.2*  ALBUMIN 3.6 4.2 4.0    TUMOR MARKERS: No results for input(s): AFPTM, CEA, CA199, CHROMGRNA in the last 8760 hours.  Assessment and Plan:  Multiple myeloma  Will proceed with image guided bone marrow biopsy today by Dr. Jennefer.  Risks and benefits of bone marrow biopsy was discussed with the patient and/or patient's family including, but not limited to bleeding, infection, damage to adjacent structures or low yield requiring additional tests.  All of the questions were answered and there is agreement to proceed.  Consent signed and in  chart.  Electronically Signed: SARI GORMAN LAMP, PA-C   07/14/2024, 2:36 PM      I spent a total of  30 Minutes   in face to face in clinical consultation, greater than 50% of which was counseling/coordinating care for bone marrow biopsy.

## 2024-07-15 ENCOUNTER — Encounter (HOSPITAL_COMMUNITY): Payer: Self-pay

## 2024-07-15 ENCOUNTER — Other Ambulatory Visit: Payer: Self-pay

## 2024-07-15 ENCOUNTER — Telehealth: Payer: Self-pay

## 2024-07-15 ENCOUNTER — Ambulatory Visit (HOSPITAL_COMMUNITY)
Admission: RE | Admit: 2024-07-15 | Discharge: 2024-07-15 | Disposition: A | Discharge status: Home / Self Care | Source: Ambulatory Visit | Attending: Hematology & Oncology | Admitting: Hematology & Oncology

## 2024-07-15 DIAGNOSIS — D500 Iron deficiency anemia secondary to blood loss (chronic): Secondary | ICD-10-CM | POA: Insufficient documentation

## 2024-07-15 DIAGNOSIS — C969 Malignant neoplasm of lymphoid, hematopoietic and related tissue, unspecified: Secondary | ICD-10-CM | POA: Diagnosis not present

## 2024-07-15 DIAGNOSIS — D6489 Other specified anemias: Secondary | ICD-10-CM | POA: Diagnosis not present

## 2024-07-15 DIAGNOSIS — C9000 Multiple myeloma not having achieved remission: Secondary | ICD-10-CM | POA: Insufficient documentation

## 2024-07-15 LAB — CBC
HCT: 38.6 % — ABNORMAL LOW (ref 39.0–52.0)
Hemoglobin: 12.6 g/dL — ABNORMAL LOW (ref 13.0–17.0)
MCH: 30.4 pg (ref 26.0–34.0)
MCHC: 32.6 g/dL (ref 30.0–36.0)
MCV: 93.2 fL (ref 80.0–100.0)
Platelets: 177 K/uL (ref 150–400)
RBC: 4.14 MIL/uL — ABNORMAL LOW (ref 4.22–5.81)
RDW: 14.6 % (ref 11.5–15.5)
WBC: 5.9 K/uL (ref 4.0–10.5)
nRBC: 0 % (ref 0.0–0.2)

## 2024-07-15 LAB — GLUCOSE, CAPILLARY
Glucose-Capillary: 125 mg/dL — ABNORMAL HIGH (ref 70–99)
Glucose-Capillary: 130 mg/dL — ABNORMAL HIGH (ref 70–99)

## 2024-07-15 LAB — PROTIME-INR
INR: 1 (ref 0.8–1.2)
Prothrombin Time: 13.5 s (ref 11.4–15.2)

## 2024-07-15 MED ORDER — FENTANYL CITRATE (PF) 100 MCG/2ML IJ SOLN
INTRAMUSCULAR | Status: AC
  Filled 2024-07-15: qty 2

## 2024-07-15 MED ORDER — MIDAZOLAM HCL 2 MG/2ML IJ SOLN
INTRAMUSCULAR | Status: AC | PRN
  Administered 2024-07-15: 1 mg via INTRAVENOUS

## 2024-07-15 MED ORDER — FLUMAZENIL 0.5 MG/5ML IV SOLN
INTRAVENOUS | Status: AC
  Filled 2024-07-15: qty 5

## 2024-07-15 MED ORDER — FENTANYL CITRATE (PF) 100 MCG/2ML IJ SOLN
INTRAMUSCULAR | Status: AC | PRN
  Administered 2024-07-15: 50 ug via INTRAVENOUS

## 2024-07-15 MED ORDER — NALOXONE HCL 0.4 MG/ML IJ SOLN
INTRAMUSCULAR | Status: AC
  Filled 2024-07-15: qty 1

## 2024-07-15 MED ORDER — MIDAZOLAM HCL 2 MG/2ML IJ SOLN
INTRAMUSCULAR | Status: AC
  Filled 2024-07-15: qty 2

## 2024-07-15 MED ORDER — SODIUM CHLORIDE 0.9 % IV SOLN: INTRAVENOUS | Status: DC

## 2024-07-15 MED ORDER — LIDOCAINE HCL 1 % IJ SOLN
INTRAMUSCULAR | Status: AC | PRN
  Administered 2024-07-15: 10 mL via INTRADERMAL

## 2024-07-15 NOTE — Discharge Instructions (Signed)
 Moderate Conscious Sedation-Care After  This sheet gives you information about how to care for yourself after your procedure. Your health care provider may also give you more specific instructions. If you have problems or questions, contact your health care provider.  After the procedure, it is common to have: Sleepiness for several hours. Impaired judgment for several hours. Difficulty with balance. Vomiting if you eat too soon.  Follow these instructions at home:  Rest. Do not participate in activities where you could fall or become injured. Do not drive or use machinery. Do not drink alcohol. Do not take sleeping pills or medicines that cause drowsiness. Do not make important decisions or sign legal documents. Do not take care of children on your own.  Eating and drinking Follow the diet recommended by your health care provider. Drink enough fluid to keep your urine pale yellow. If you vomit: Drink water, juice, or soup when you can drink without vomiting. Make sure you have little or no nausea before eating solid foods.  General instructions Take over-the-counter and prescription medicines only as told by your health care provider. Have a responsible adult stay with you for the time you are told. It is important to have someone help care for you until you are awake and alert. Do not smoke. Keep all follow-up visits as told by your health care provider. This is important.  Contact a health care provider if: You are still sleepy or having trouble with balance after 24 hours. You feel light-headed. You keep feeling nauseous or you keep vomiting. You develop a rash. You have a fever. You have redness or swelling around the IV site.  Get help right away if: You have trouble breathing. You have new-onset confusion at home.  This information is not intended to replace advice given to you by your health care provider. Make sure you discuss any questions you have with your  healthcare provider.

## 2024-07-15 NOTE — Telephone Encounter (Signed)
 Patient was identified as falling into the [place] Measure - Diabetes.   Patient was: Appointment already scheduled for:  Pt has f/u appts scheduled with PCP and Rx. SABRA

## 2024-07-15 NOTE — Procedures (Signed)
Interventional Radiology Procedure Note  Procedure: CT guided aspirate and core biopsy of right iliac bone  Complications: None  Recommendations: - Bedrest supine x 1 hrs - Hydrocodone PRN  Pain - Follow biopsy results   Arel Tippen, MD   

## 2024-07-16 ENCOUNTER — Other Ambulatory Visit: Payer: Self-pay | Admitting: *Deleted

## 2024-07-16 DIAGNOSIS — C9000 Multiple myeloma not having achieved remission: Secondary | ICD-10-CM

## 2024-07-16 MED ORDER — LENALIDOMIDE 10 MG PO CAPS: 10.0000 mg | ORAL_CAPSULE | Freq: Every day | ORAL | 0 refills | Status: DC

## 2024-07-17 LAB — SURGICAL PATHOLOGY

## 2024-07-23 NOTE — Telephone Encounter (Signed)
 Oral Chemotherapy Pharmacist Encounter  I spoke with patient for overview of: Revlimid  (lenalidomide ) for the treatment of multiple myeloma in conjunction with daratumumab, boretezomib and dexamethasone , planned duration until disease progression or unacceptable drug toxicity.   Counseled patient on administration, dosing, side effects, monitoring, drug-food interactions, safe handling, storage, and disposal.  Patient will take Revlimid  10mg  capsules, 1 capsule by mouth once daily, without regard to food, with a full glass of water.  Revlimid  will be given 21 days on, 7 days off, repeat every 28 days.  Revlimid  start date: 07/29/24  Adverse effects of Revlimid  include but are not limited to: nausea, GI upset, rash, fatigue, and decreased blood counts.   VZV PPX: Patient has famciclovir  and will start this on 07/26/24 VTE PPX: Patient will start taking ASA 81mg  for VTE PPX.  Reviewed with patient importance of keeping a medication schedule and plan for any missed doses. No barriers to medication adherence identified.  Medication reconciliation performed and medication/allergy list updated.  Patient counseled on importance of daily aspirin  81mg  for VTE prophylaxis.  Insurance authorization for Revlimid  has been obtained.  Revlimid  prescription is being dispensed from [facility] as it is a limited distribution medication.  All questions answered.  Distress thermometer not completed during telephone call as patient has been on previous lines of therapy.   Michael Wiggins voiced understanding and appreciation.   Medication education handout placed in mail for patient. Patient knows to call the office with questions or concerns. Oral Chemotherapy Clinic phone number provided to patient.   Michael Wiggins, PharmD, BCPS, BCOP Hematology/Oncology Clinical Pharmacist [phone] 07/23/2024 11:44 AM

## 2024-07-23 NOTE — Telephone Encounter (Signed)
 Oral Chemotherapy Pharmacist Encounter   Attempted to reach patient to provide update and offer for initial counseling on oral medication: Revlimid  (lenalidomide ).   No answer. Left voicemail for patient to call back.  Asberry Macintosh, PharmD, BCPS, BCOP Hematology/Oncology Clinical Pharmacist [phone] 07/23/2024 11:06 AM

## 2024-07-25 ENCOUNTER — Encounter: Payer: Self-pay | Admitting: Hematology & Oncology

## 2024-07-25 ENCOUNTER — Encounter (HOSPITAL_COMMUNITY): Payer: Self-pay | Admitting: Hematology & Oncology

## 2024-07-28 ENCOUNTER — Encounter: Payer: Self-pay | Admitting: Hematology & Oncology

## 2024-07-28 ENCOUNTER — Other Ambulatory Visit: Payer: Self-pay | Admitting: *Deleted

## 2024-07-28 DIAGNOSIS — C9002 Multiple myeloma in relapse: Secondary | ICD-10-CM

## 2024-07-28 MED ORDER — PROCHLORPERAZINE MALEATE 10 MG PO TABS: 10.0000 mg | ORAL_TABLET | Freq: Four times a day (QID) | ORAL | 1 refills | Status: AC | PRN

## 2024-07-28 MED ORDER — DEXAMETHASONE 4 MG PO TABS: ORAL_TABLET | ORAL | 5 refills | Status: AC

## 2024-07-28 MED ORDER — ONDANSETRON HCL 8 MG PO TABS: 8.0000 mg | ORAL_TABLET | Freq: Three times a day (TID) | ORAL | 1 refills | Status: AC | PRN

## 2024-07-28 MED ORDER — ASPIRIN 325 MG PO TABS
325.0000 mg | ORAL_TABLET | Freq: Every day | ORAL | 3 refills | Status: AC
Start: 2024-07-28 — End: ?

## 2024-07-29 ENCOUNTER — Other Ambulatory Visit: Payer: Self-pay

## 2024-07-29 ENCOUNTER — Inpatient Hospital Stay (HOSPITAL_BASED_OUTPATIENT_CLINIC_OR_DEPARTMENT_OTHER): Admitting: Hematology & Oncology

## 2024-07-29 ENCOUNTER — Other Ambulatory Visit: Payer: Self-pay | Admitting: *Deleted

## 2024-07-29 ENCOUNTER — Encounter: Payer: Self-pay | Admitting: Hematology & Oncology

## 2024-07-29 ENCOUNTER — Inpatient Hospital Stay

## 2024-07-29 ENCOUNTER — Inpatient Hospital Stay: Attending: Hematology & Oncology

## 2024-07-29 VITALS — BP 149/81 | HR 65

## 2024-07-29 VITALS — BP 134/85 | HR 84 | Temp 98.0°F | Resp 18 | Wt 203.1 lb

## 2024-07-29 DIAGNOSIS — Z5112 Encounter for antineoplastic immunotherapy: Secondary | ICD-10-CM | POA: Insufficient documentation

## 2024-07-29 DIAGNOSIS — C9000 Multiple myeloma not having achieved remission: Secondary | ICD-10-CM

## 2024-07-29 DIAGNOSIS — C9002 Multiple myeloma in relapse: Secondary | ICD-10-CM

## 2024-07-29 DIAGNOSIS — D509 Iron deficiency anemia, unspecified: Secondary | ICD-10-CM | POA: Diagnosis not present

## 2024-07-29 DIAGNOSIS — Z79899 Other long term (current) drug therapy: Secondary | ICD-10-CM | POA: Insufficient documentation

## 2024-07-29 LAB — CMP (CANCER CENTER ONLY)
ALT: 23 U/L (ref 0–44)
AST: 19 U/L (ref 15–41)
Albumin: 3.8 g/dL (ref 3.5–5.0)
Alkaline Phosphatase: 58 U/L (ref 38–126)
Anion gap: 12 (ref 5–15)
BUN: 26 mg/dL — ABNORMAL HIGH (ref 8–23)
CO2: 19 mmol/L — ABNORMAL LOW (ref 22–32)
Calcium: 9.3 mg/dL (ref 8.9–10.3)
Chloride: 103 mmol/L (ref 98–111)
Creatinine: 1.7 mg/dL — ABNORMAL HIGH (ref 0.61–1.24)
GFR, Estimated: 41 mL/min — ABNORMAL LOW (ref >60)
Glucose, Bld: 196 mg/dL — ABNORMAL HIGH (ref 70–99)
Potassium: 4.1 mmol/L (ref 3.5–5.1)
Sodium: 134 mmol/L — ABNORMAL LOW (ref 135–145)
Total Bilirubin: 0.5 mg/dL (ref 0.0–1.2)
Total Protein: 7.7 g/dL (ref 6.5–8.1)

## 2024-07-29 LAB — TYPE AND SCREEN
ABO/RH(D): O POS
Antibody Screen: NEGATIVE

## 2024-07-29 LAB — CBC WITH DIFFERENTIAL (CANCER CENTER ONLY)
Abs Immature Granulocytes: 0.01 K/uL (ref 0.00–0.07)
Basophils Absolute: 0 K/uL (ref 0.0–0.1)
Basophils Relative: 1 %
Eosinophils Absolute: 0.4 K/uL (ref 0.0–0.5)
Eosinophils Relative: 7 %
HCT: 39.6 % (ref 39.0–52.0)
Hemoglobin: 13.4 g/dL (ref 13.0–17.0)
Immature Granulocytes: 0 %
Lymphocytes Relative: 26 %
Lymphs Abs: 1.6 K/uL (ref 0.7–4.0)
MCH: 30.6 pg (ref 26.0–34.0)
MCHC: 33.8 g/dL (ref 30.0–36.0)
MCV: 90.4 fL (ref 80.0–100.0)
Monocytes Absolute: 0.6 K/uL (ref 0.1–1.0)
Monocytes Relative: 9 %
Neutro Abs: 3.5 K/uL (ref 1.7–7.7)
Neutrophils Relative %: 57 %
Platelet Count: 185 K/uL (ref 150–400)
RBC: 4.38 MIL/uL (ref 4.22–5.81)
RDW: 14.2 % (ref 11.5–15.5)
WBC Count: 6.1 K/uL (ref 4.0–10.5)
nRBC: 0 % (ref 0.0–0.2)

## 2024-07-29 LAB — LACTATE DEHYDROGENASE: LDH: 145 U/L (ref 98–192)

## 2024-07-29 MED ORDER — ACETAMINOPHEN 325 MG PO TABS
650.0000 mg | ORAL_TABLET | Freq: Once | ORAL | Status: AC
  Administered 2024-07-29 (×2): 650 mg via ORAL
  Filled 2024-07-29: qty 2

## 2024-07-29 MED ORDER — BORTEZOMIB CHEMO SQ INJECTION 3.5 MG (2.5MG/ML)
1.3000 mg/m2 | Freq: Once | INTRAMUSCULAR | Status: AC
  Administered 2024-07-29 (×2): 2.75 mg via SUBCUTANEOUS
  Filled 2024-07-29: qty 1.1

## 2024-07-29 MED ORDER — DEXAMETHASONE 4 MG PO TABS
20.0000 mg | ORAL_TABLET | Freq: Once | ORAL | Status: AC
  Administered 2024-07-29 (×2): 20 mg via ORAL
  Filled 2024-07-29: qty 5

## 2024-07-29 MED ORDER — DARATUMUMAB-HYALURONIDASE-FIHJ 1800-30000 MG-UT/15ML ~~LOC~~ SOLN
1800.0000 mg | Freq: Once | SUBCUTANEOUS | Status: AC
  Administered 2024-07-29 (×2): 1800 mg via SUBCUTANEOUS
  Filled 2024-07-29: qty 15

## 2024-07-29 MED ORDER — MONTELUKAST SODIUM 10 MG PO TABS
10.0000 mg | ORAL_TABLET | Freq: Once | ORAL | Status: AC
  Administered 2024-07-29 (×2): 10 mg via ORAL
  Filled 2024-07-29: qty 1

## 2024-07-29 MED ORDER — MONTELUKAST SODIUM 10 MG PO TABS: ORAL_TABLET | ORAL | 2 refills | Status: DC

## 2024-07-29 MED ORDER — DIPHENHYDRAMINE HCL 25 MG PO CAPS
50.0000 mg | ORAL_CAPSULE | Freq: Once | ORAL | Status: AC
  Administered 2024-07-29 (×2): 50 mg via ORAL
  Filled 2024-07-29: qty 2

## 2024-07-29 NOTE — Progress Notes (Signed)
 Hematology and Oncology Follow Up Visit  Michael Godlewski Sr. [id number] [birth year] 78 y.o. 07/29/2024   Principle Diagnosis:  IgG kappa smoldering myeloma - progressive Iron  deficiency anemia -- Venofer  given on 03/2020  Current Therapy:   Faspro/Velcade /Revlimid /Dex -- start cycle #1 on 07/29/2024     Interim History:  [name] is back for treatment.  We now going ahead with his treatment.  His myeloma levels have been going up.  I want to make sure that we try to be proactive and not get down before he starts to have problems.  We did have a bone marrow biopsy done.  This was done on 07/15/2024.  The pathology report ([id number]) showed plasma cell infiltration.  He had 15% plasma cells.  There were kappa restricted.  The cytogenetics were normal.  I think he had a normal FISH panel.  Will not go ahead and get him on treatment.  He has gotten through his daughter's wedding.  Had a wonderful time.  He feels good.  Has had no problems with nausea or vomiting.  He had a PET scan that was done.  He PET scan is relatively unremarkable for any obvious myelomatous bone lesions.  He has had no change in bowel or bladder habits.  There is been no leg swelling.  He has had no cough or shortness of breath.  He has had no headache.  Overall, his performance status is ECOG 0.   Medications:  Current Outpatient Medications:    albuterol  (VENTOLIN  HFA) 108 (90 Base) MCG/ACT inhaler, Inhale 1-2 puffs into the lungs every 6 (six) hours as needed for wheezing or shortness of breath., Disp: 8.5 each, Rfl: 6   aspirin  325 MG tablet, Take 1 tablet (325 mg total) by mouth daily., Disp: 100 tablet, Rfl: 3   atorvastatin  (LIPITOR) 20 MG tablet, Take 1 tablet (20 mg total) by mouth daily., Disp: 90 tablet, Rfl: 3   carvedilol  (COREG ) 25 MG tablet, TAKE 1 AND 1/2 TABLETS (37.5 MG TOTAL) BY MOUTH 2 (TWO) TIMES DAILY WITH A MEAL. (Patient taking differently: Take 12.5 mg by mouth 2 (two) times daily with a  meal. Take half a tablet twice a day), Disp: 270 tablet, Rfl: 2   dexamethasone  (DECADRON ) 4 MG tablet, Take 5 tabs (20 mg) weekly the day after daratumumab  for 12 weeks. Take with breakfast., Disp: 20 tablet, Rfl: 5   DHA-EPA-Vitamin E (OMEGA-3 COMPLEX) 192-251-11 MG-MG-UNIT CAPS, Take 1 capsule by mouth every other day., Disp: , Rfl:    doxycycline  (VIBRAMYCIN ) 100 MG capsule, Take 200 mg by mouth once for tick bite, Disp: 6 capsule, Rfl: 0   ENTRESTO  49-51 MG, TAKE 1 TABLET BY MOUTH TWICE A DAY, Disp: 180 tablet, Rfl: 1   famciclovir  (FAMVIR ) 250 MG tablet, Take 1 tablet (250 mg total) by mouth 2 (two) times daily. Please start this 3 days prior to starting chemotherapy in August., Disp: 30 tablet, Rfl: 12   ferrous sulfate  325 (65 FE) MG tablet, Take 325 mg by mouth every other day., Disp: , Rfl:    finasteride (PROSCAR) 5 MG tablet, Take 5 mg by mouth daily., Disp: , Rfl:    Ginkgo Biloba 40 MG TABS, Take 1 tablet by mouth daily. , Disp: , Rfl:    glucosamine-chondroitin 500-400 MG tablet, Take 1 tablet by mouth daily., Disp: , Rfl:    glucose blood (ONETOUCH ULTRA) test strip, Use to check blood lgucose once daily, Disp: 100 strip, Rfl: 3   JARDIANCE  25  MG TABS tablet, TAKE 1 TABLET BY MOUTH DAILY BEFORE BREAKFAST., Disp: 90 tablet, Rfl: 1   latanoprost  (XALATAN ) 0.005 % ophthalmic solution, Place 1 drop into both eyes at bedtime. , Disp: , Rfl:    lenalidomide  (REVLIMID ) 10 MG capsule, Take 1 capsule (10 mg total) by mouth daily. Take for 21 days on, 7 days off. Repeat every 28 days. [employer] Auth # [id number]; Date Obtained 07/16/24, Disp: 21 capsule, Rfl: 0   Multiple Vitamin (MULTIVITAMIN WITH MINERALS) TABS, Take 1 tablet by mouth daily. , Disp: , Rfl:    ondansetron  (ZOFRAN ) 8 MG tablet, Take 1 tablet (8 mg total) by mouth every 8 (eight) hours as needed for nausea or vomiting., Disp: 30 tablet, Rfl: 1   OneTouch Delica Lancets 33G MISC, Use to check blood glucose once daily (Dx: E11.9),  Disp: 100 each, Rfl: 3   prochlorperazine  (COMPAZINE ) 10 MG tablet, Take 1 tablet (10 mg total) by mouth every 6 (six) hours as needed for nausea or vomiting., Disp: 30 tablet, Rfl: 1   Semaglutide ,0.25 or 0.5MG /DOS, (OZEMPIC , 0.25 OR 0.5 MG/DOSE,) 2 MG/3ML SOPN, Inject 0.5 mg into the skin once a week., Disp: , Rfl:    tamsulosin (FLOMAX) 0.4 MG CAPS capsule, Take 2 capsules (0.8 mg total) by mouth at bedtime., Disp: , Rfl:   Allergies:  Allergies  Allergen Reactions   Amlodipine  Swelling   Atorvastatin      Other Reaction(s): hard to focus   Rosuvastatin Other (See Comments)    Per pt unable to focus  Other Reaction(s): hard to focus    Past Medical History, Surgical history, Social history, and Family History were reviewed and updated.  Review of Systems: Review of Systems  Constitutional: Negative.   HENT: Negative.    Eyes: Negative.   Respiratory: Negative.    Cardiovascular: Negative.   Gastrointestinal: Negative.   Genitourinary: Negative.   Musculoskeletal: Negative.   Skin: Negative.   Neurological: Negative.   Endo/Heme/Allergies: Negative.   Psychiatric/Behavioral: Negative.       Physical Exam: Vital signs are temperature of 98.  Pulse 84.  Blood pressure 134/85.  Weight is 203 pounds.   Physical Exam Vitals reviewed.  HENT:     Head: Normocephalic and atraumatic.  Eyes:     Pupils: Pupils are equal, round, and reactive to light.  Cardiovascular:     Rate and Rhythm: Normal rate and regular rhythm.     Heart sounds: Normal heart sounds.  Pulmonary:     Effort: Pulmonary effort is normal.     Breath sounds: Normal breath sounds.  Abdominal:     General: Bowel sounds are normal.     Palpations: Abdomen is soft.  Musculoskeletal:        General: No tenderness or deformity. Normal range of motion.     Cervical back: Normal range of motion.  Lymphadenopathy:     Cervical: No cervical adenopathy.  Skin:    General: Skin is warm and dry.     Findings:  No erythema or rash.  Neurological:     Mental Status: He is alert and oriented to person, place, and time.  Psychiatric:        Behavior: Behavior normal.        Thought Content: Thought content normal.        Judgment: Judgment normal.      Lab Results  Component Value Date   WBC 5.9 07/15/2024   HGB 12.6 (L) 07/15/2024   HCT 38.6 (L) 07/15/2024  MCV 93.2 07/15/2024   PLT 177 07/15/2024     Chemistry      Component Value Date/Time   NA 134 (L) 06/16/2024 1351   NA 133 (L) 01/12/2020 1015   NA 142 10/03/2017 0910   NA 137 04/03/2017 0852   K 4.5 06/16/2024 1351   K 3.9 10/03/2017 0910   K 4.2 04/03/2017 0852   CL 106 06/16/2024 1351   CL 103 10/03/2017 0910   CL 108 (H) 05/15/2013 0912   CO2 22 06/16/2024 1351   CO2 28 10/03/2017 0910   CO2 26 04/03/2017 0852   BUN 30 (H) 06/16/2024 1351   BUN 25 01/12/2020 1015   BUN 13 10/03/2017 0910   BUN 16.3 04/03/2017 0852   CREATININE 1.92 (H) 06/16/2024 1351   CREATININE 1.07 11/16/2017 1437   CREATININE 1.0 04/03/2017 0852      Component Value Date/Time   CALCIUM  9.3 06/16/2024 1351   CALCIUM  9.3 10/03/2017 0910   CALCIUM  9.0 04/03/2017 0852   ALKPHOS 56 06/16/2024 1351   ALKPHOS 51 10/03/2017 0910   ALKPHOS 46 04/03/2017 0852   AST 15 06/16/2024 1351   AST 18 04/03/2017 0852   ALT 18 06/16/2024 1351   ALT 34 10/03/2017 0910   ALT 19 04/03/2017 0852   BILITOT 0.5 06/16/2024 1351   BILITOT 0.38 04/03/2017 0852     Impression and Plan: Mr. Candy is 78 year old gentleman with IgG kappa smoldering myeloma.  I have been watching him for several years.  Again, his monoclonal studies have been slowly rising.  I think we will have to get treatment started.    Will go ahead with treatment today.  I do not see any problems with starting him on therapy.  He can start the Revlimid  either today or tomorrow.  Hopefully, we will not have to treat him for all that long.  I know that he is quite active.  He is quite busy.   He still works.  I will make sure that we focus on his quality of life.    Maude JONELLE Crease, MD 8/12/20259:15 AM

## 2024-07-29 NOTE — Progress Notes (Signed)
 Patient starting Faspro and Velcade  today in infusion room.  Education done while patient was waiting with premeds.  Discussed side effects of Faspro and Velcade  which include but are not limited to myelosuppression, decreased appetite, fatigue, fever, allergic or infusional reaction, mucositis, cardiac toxicity, cough, SOB, altered taste, nausea and vomiting, diarrhea,  hair loss or thinning, rash, skin dryness, nail changes, peripheral neuropathy, delayed wound healing, mental changes (Chemo brain), increased risk of infections, weight loss.  Reviewed infusion room and office policy and procedure and phone numbers 24 hours x 7 days a week.   Reviewed when to call the office with any concerns or problems.  Transport planner given.  Antiemetic protocol and chemotherapy schedule reviewed. Patient verbalized understanding of chemotherapy indications and possible side effects.  Teachback done

## 2024-07-29 NOTE — Patient Instructions (Signed)
 [facility] - A DEPT OF [facility]  Discharge Instructions: Thank you for choosing [facility] to provide your oncology and hematology care.   If you have a lab appointment with the Cancer Center, please go directly to the Cancer Center and check in at the registration area.  Wear comfortable clothing and clothing appropriate for easy access to any Portacath or PICC line.   We strive to give you quality time with your provider. You may need to reschedule your appointment if you arrive late (15 or more minutes).  Arriving late affects you and other patients whose appointments are after yours.  Also, if you miss three or more appointments without notifying the office, you may be dismissed from the clinic at the provider's discretion.      For prescription refill requests, have your pharmacy contact our office and allow 72 hours for refills to be completed.    Today you received the following chemotherapy and/or immunotherapy agents Faspro, Velcade       To help prevent nausea and vomiting after your treatment, we encourage you to take your nausea medication as directed.  BELOW ARE SYMPTOMS THAT SHOULD BE REPORTED IMMEDIATELY: *FEVER GREATER THAN 100.4 F (38 C) OR HIGHER *CHILLS OR SWEATING *NAUSEA AND VOMITING THAT IS NOT CONTROLLED WITH YOUR NAUSEA MEDICATION *UNUSUAL SHORTNESS OF BREATH *UNUSUAL BRUISING OR BLEEDING *URINARY PROBLEMS (pain or burning when urinating, or frequent urination) *BOWEL PROBLEMS (unusual diarrhea, constipation, pain near the anus) TENDERNESS IN MOUTH AND THROAT WITH OR WITHOUT PRESENCE OF ULCERS (sore throat, sores in mouth, or a toothache) UNUSUAL RASH, SWELLING OR PAIN  UNUSUAL VAGINAL DISCHARGE OR ITCHING   Items with * indicate a potential emergency and should be followed up as soon as possible or go to the Emergency Department if any problems should occur.  Please show the CHEMOTHERAPY ALERT CARD or  IMMUNOTHERAPY ALERT CARD at check-in to the Emergency Department and triage nurse. Should you have questions after your visit or need to cancel or reschedule your appointment, please contact [facility] - A DEPT OF [facility]  [phone] and follow the prompts.  Office hours are 8:00 a.m. to 4:30 p.m. Monday - Friday. Please note that voicemails left after 4:00 p.m. may not be returned until the following business day.  We are closed weekends and major holidays. You have access to a nurse at all times for urgent questions. Please call the main number to the clinic [phone] and follow the prompts.  For any non-urgent questions, you may also contact your provider using MyChart. We now offer e-Visits for anyone 41 and older to request care online for non-urgent symptoms. For details visit [email or web address].   Also download the MyChart app! Go to the app store, search MyChart, open the app, select [facility], and log in with your MyChart username and password.

## 2024-07-29 NOTE — Progress Notes (Signed)
 Ok to treat with creatinine of 1.7 per Dr Timmy

## 2024-07-30 LAB — IGG, IGA, IGM
IgA: 227 mg/dL (ref 61–437)
IgG (Immunoglobin G), Serum: 2912 mg/dL — ABNORMAL HIGH (ref 603–1613)
IgM (Immunoglobulin M), Srm: 20 mg/dL (ref 15–143)

## 2024-07-30 LAB — PRETREATMENT RBC PHENOTYPE: DAT, IgG: NEGATIVE

## 2024-07-30 LAB — KAPPA/LAMBDA LIGHT CHAINS
Kappa free light chain: 143.5 mg/L — ABNORMAL HIGH (ref 3.3–19.4)
Kappa, lambda light chain ratio: 6.93 — ABNORMAL HIGH (ref 0.26–1.65)
Lambda free light chains: 20.7 mg/L (ref 5.7–26.3)

## 2024-08-01 LAB — IMMUNOFIXATION REFLEX, SERUM
IgA: 220 mg/dL (ref 61–437)
IgG (Immunoglobin G), Serum: 2742 mg/dL — ABNORMAL HIGH (ref 603–1613)
IgM (Immunoglobulin M), Srm: 21 mg/dL (ref 15–143)

## 2024-08-01 LAB — PROTEIN ELECTROPHORESIS, SERUM, WITH REFLEX
A/G Ratio: 0.8 (ref 0.7–1.7)
Albumin ELP: 3.4 g/dL (ref 2.9–4.4)
Alpha-1-Globulin: 0.2 g/dL (ref 0.0–0.4)
Alpha-2-Globulin: 0.7 g/dL (ref 0.4–1.0)
Beta Globulin: 0.9 g/dL (ref 0.7–1.3)
Gamma Globulin: 2.3 g/dL — ABNORMAL HIGH (ref 0.4–1.8)
Globulin, Total: 4.1 g/dL — ABNORMAL HIGH (ref 2.2–3.9)
M-Spike, %: 1.6 g/dL — ABNORMAL HIGH
SPEP Interpretation: 0
Total Protein ELP: 7.5 g/dL (ref 6.0–8.5)

## 2024-08-01 NOTE — Progress Notes (Signed)
 His diagnosis is relapse of his myeloma.

## 2024-08-04 ENCOUNTER — Ambulatory Visit: Admitting: Hematology & Oncology

## 2024-08-04 ENCOUNTER — Inpatient Hospital Stay

## 2024-08-05 ENCOUNTER — Inpatient Hospital Stay

## 2024-08-05 VITALS — BP 117/87 | HR 67 | Temp 98.0°F | Resp 18 | Ht 75.0 in | Wt 205.0 lb

## 2024-08-05 DIAGNOSIS — C9002 Multiple myeloma in relapse: Secondary | ICD-10-CM | POA: Diagnosis not present

## 2024-08-05 DIAGNOSIS — Z5112 Encounter for antineoplastic immunotherapy: Secondary | ICD-10-CM | POA: Diagnosis not present

## 2024-08-05 DIAGNOSIS — D509 Iron deficiency anemia, unspecified: Secondary | ICD-10-CM | POA: Diagnosis not present

## 2024-08-05 DIAGNOSIS — Z79899 Other long term (current) drug therapy: Secondary | ICD-10-CM | POA: Diagnosis not present

## 2024-08-05 LAB — CMP (CANCER CENTER ONLY)
ALT: 25 U/L (ref 0–44)
AST: 19 U/L (ref 15–41)
Albumin: 3.9 g/dL (ref 3.5–5.0)
Alkaline Phosphatase: 64 U/L (ref 38–126)
Anion gap: 9 (ref 5–15)
BUN: 28 mg/dL — ABNORMAL HIGH (ref 8–23)
CO2: 23 mmol/L (ref 22–32)
Calcium: 9 mg/dL (ref 8.9–10.3)
Chloride: 102 mmol/L (ref 98–111)
Creatinine: 1.7 mg/dL — ABNORMAL HIGH (ref 0.61–1.24)
GFR, Estimated: 41 mL/min — ABNORMAL LOW (ref >60)
Glucose, Bld: 177 mg/dL — ABNORMAL HIGH (ref 70–99)
Potassium: 4.8 mmol/L (ref 3.5–5.1)
Sodium: 134 mmol/L — ABNORMAL LOW (ref 135–145)
Total Bilirubin: 0.3 mg/dL (ref 0.0–1.2)
Total Protein: 7.6 g/dL (ref 6.5–8.1)

## 2024-08-05 LAB — CBC WITH DIFFERENTIAL (CANCER CENTER ONLY)
Abs Immature Granulocytes: 0.01 K/uL (ref 0.00–0.07)
Basophils Absolute: 0 K/uL (ref 0.0–0.1)
Basophils Relative: 0 %
Eosinophils Absolute: 0.6 K/uL — ABNORMAL HIGH (ref 0.0–0.5)
Eosinophils Relative: 9 %
HCT: 37.4 % — ABNORMAL LOW (ref 39.0–52.0)
Hemoglobin: 12.5 g/dL — ABNORMAL LOW (ref 13.0–17.0)
Immature Granulocytes: 0 %
Lymphocytes Relative: 15 %
Lymphs Abs: 0.9 K/uL (ref 0.7–4.0)
MCH: 30.4 pg (ref 26.0–34.0)
MCHC: 33.4 g/dL (ref 30.0–36.0)
MCV: 91 fL (ref 80.0–100.0)
Monocytes Absolute: 0.7 K/uL (ref 0.1–1.0)
Monocytes Relative: 12 %
Neutro Abs: 3.8 K/uL (ref 1.7–7.7)
Neutrophils Relative %: 64 %
Platelet Count: 166 K/uL (ref 150–400)
RBC: 4.11 MIL/uL — ABNORMAL LOW (ref 4.22–5.81)
RDW: 14.3 % (ref 11.5–15.5)
WBC Count: 6 K/uL (ref 4.0–10.5)
nRBC: 0 % (ref 0.0–0.2)

## 2024-08-05 MED ORDER — MONTELUKAST SODIUM 10 MG PO TABS: 10.0000 mg | ORAL_TABLET | Freq: Once | ORAL | Status: DC

## 2024-08-05 MED ORDER — BORTEZOMIB CHEMO SQ INJECTION 3.5 MG (2.5MG/ML)
1.3000 mg/m2 | Freq: Once | INTRAMUSCULAR | Status: AC
  Administered 2024-08-05: 2.75 mg via SUBCUTANEOUS
  Filled 2024-08-05: qty 1.1

## 2024-08-05 MED ORDER — DIPHENHYDRAMINE HCL 25 MG PO CAPS: 50.0000 mg | ORAL_CAPSULE | Freq: Once | ORAL | Status: DC

## 2024-08-05 MED ORDER — DEXAMETHASONE 4 MG PO TABS
20.0000 mg | ORAL_TABLET | Freq: Once | ORAL | Status: DC
Start: 2024-08-05 — End: 2024-08-05

## 2024-08-05 MED ORDER — ACETAMINOPHEN 325 MG PO TABS
650.0000 mg | ORAL_TABLET | Freq: Once | ORAL | Status: DC
Start: 2024-08-05 — End: 2024-08-05

## 2024-08-05 MED ORDER — DARATUMUMAB-HYALURONIDASE-FIHJ 1800-30000 MG-UT/15ML ~~LOC~~ SOLN
1800.0000 mg | Freq: Once | SUBCUTANEOUS | Status: AC
  Administered 2024-08-05: 1800 mg via SUBCUTANEOUS
  Filled 2024-08-05: qty 15

## 2024-08-05 NOTE — Progress Notes (Signed)
 To treat with creatinine level from today per Lauraine Dais, PA.

## 2024-08-06 ENCOUNTER — Encounter: Payer: Self-pay | Admitting: Family Medicine

## 2024-08-07 ENCOUNTER — Encounter: Payer: Self-pay | Admitting: Internal Medicine

## 2024-08-08 ENCOUNTER — Telehealth: Payer: Self-pay

## 2024-08-08 NOTE — Telephone Encounter (Signed)
 Patient was identified as falling into the [place] Measure - Diabetes.   Patient was: Appointment already scheduled for:  FU w/PCP on 08/27/2024 and Pharmacist on 08/13/2024. Michael Wiggins

## 2024-08-10 ENCOUNTER — Other Ambulatory Visit: Payer: Self-pay | Admitting: Family Medicine

## 2024-08-11 ENCOUNTER — Encounter: Payer: Self-pay | Admitting: Neurology

## 2024-08-11 MED ORDER — ACCU-CHEK SOFTCLIX LANCETS MISC: 1 refills | Status: AC

## 2024-08-11 MED ORDER — ACCU-CHEK GUIDE TEST VI STRP: ORAL_STRIP | 1 refills | Status: DC

## 2024-08-12 ENCOUNTER — Inpatient Hospital Stay

## 2024-08-12 ENCOUNTER — Encounter (HOSPITAL_COMMUNITY): Payer: Self-pay

## 2024-08-12 VITALS — BP 123/71 | HR 72 | Temp 97.8°F | Resp 18

## 2024-08-12 DIAGNOSIS — Z79899 Other long term (current) drug therapy: Secondary | ICD-10-CM | POA: Diagnosis not present

## 2024-08-12 DIAGNOSIS — C9002 Multiple myeloma in relapse: Secondary | ICD-10-CM

## 2024-08-12 DIAGNOSIS — Z5112 Encounter for antineoplastic immunotherapy: Secondary | ICD-10-CM | POA: Diagnosis not present

## 2024-08-12 DIAGNOSIS — D509 Iron deficiency anemia, unspecified: Secondary | ICD-10-CM | POA: Diagnosis not present

## 2024-08-12 LAB — CMP (CANCER CENTER ONLY)
ALT: 39 U/L (ref 0–44)
AST: 23 U/L (ref 15–41)
Albumin: 3.9 g/dL (ref 3.5–5.0)
Alkaline Phosphatase: 66 U/L (ref 38–126)
Anion gap: 11 (ref 5–15)
BUN: 26 mg/dL — ABNORMAL HIGH (ref 8–23)
CO2: 21 mmol/L — ABNORMAL LOW (ref 22–32)
Calcium: 8.5 mg/dL — ABNORMAL LOW (ref 8.9–10.3)
Chloride: 101 mmol/L (ref 98–111)
Creatinine: 1.77 mg/dL — ABNORMAL HIGH (ref 0.61–1.24)
GFR, Estimated: 39 mL/min — ABNORMAL LOW (ref >60)
Glucose, Bld: 246 mg/dL — ABNORMAL HIGH (ref 70–99)
Potassium: 4.3 mmol/L (ref 3.5–5.1)
Sodium: 133 mmol/L — ABNORMAL LOW (ref 135–145)
Total Bilirubin: 0.3 mg/dL (ref 0.0–1.2)
Total Protein: 7 g/dL (ref 6.5–8.1)

## 2024-08-12 LAB — CBC WITH DIFFERENTIAL (CANCER CENTER ONLY)
Abs Immature Granulocytes: 0.03 K/uL (ref 0.00–0.07)
Basophils Absolute: 0 K/uL (ref 0.0–0.1)
Basophils Relative: 0 %
Eosinophils Absolute: 0.4 K/uL (ref 0.0–0.5)
Eosinophils Relative: 5 %
HCT: 34.5 % — ABNORMAL LOW (ref 39.0–52.0)
Hemoglobin: 11.8 g/dL — ABNORMAL LOW (ref 13.0–17.0)
Immature Granulocytes: 0 %
Lymphocytes Relative: 11 %
Lymphs Abs: 0.8 K/uL (ref 0.7–4.0)
MCH: 30.6 pg (ref 26.0–34.0)
MCHC: 34.2 g/dL (ref 30.0–36.0)
MCV: 89.6 fL (ref 80.0–100.0)
Monocytes Absolute: 1 K/uL (ref 0.1–1.0)
Monocytes Relative: 14 %
Neutro Abs: 4.9 K/uL (ref 1.7–7.7)
Neutrophils Relative %: 70 %
Platelet Count: 197 K/uL (ref 150–400)
RBC: 3.85 MIL/uL — ABNORMAL LOW (ref 4.22–5.81)
RDW: 14.3 % (ref 11.5–15.5)
WBC Count: 7.1 K/uL (ref 4.0–10.5)
nRBC: 0 % (ref 0.0–0.2)

## 2024-08-12 MED ORDER — BORTEZOMIB CHEMO SQ INJECTION 3.5 MG (2.5MG/ML)
1.3000 mg/m2 | Freq: Once | INTRAMUSCULAR | Status: AC
  Administered 2024-08-12: 2.75 mg via SUBCUTANEOUS
  Filled 2024-08-12: qty 1.1

## 2024-08-12 MED ORDER — ACETAMINOPHEN 325 MG PO TABS
650.0000 mg | ORAL_TABLET | Freq: Once | ORAL | Status: DC
  Filled 2024-08-12: qty 2

## 2024-08-12 MED ORDER — DIPHENHYDRAMINE HCL 25 MG PO CAPS
50.0000 mg | ORAL_CAPSULE | Freq: Once | ORAL | Status: DC
  Filled 2024-08-12: qty 2

## 2024-08-12 MED ORDER — DARATUMUMAB-HYALURONIDASE-FIHJ 1800-30000 MG-UT/15ML ~~LOC~~ SOLN
1800.0000 mg | Freq: Once | SUBCUTANEOUS | Status: AC
  Administered 2024-08-12: 1800 mg via SUBCUTANEOUS
  Filled 2024-08-12: qty 15

## 2024-08-12 MED ORDER — MONTELUKAST SODIUM 10 MG PO TABS
10.0000 mg | ORAL_TABLET | Freq: Once | ORAL | Status: DC
  Filled 2024-08-12: qty 1

## 2024-08-12 MED ORDER — DEXAMETHASONE 4 MG PO TABS
20.0000 mg | ORAL_TABLET | Freq: Once | ORAL | Status: DC
Start: 2024-08-12 — End: 2024-08-12
  Filled 2024-08-12: qty 5

## 2024-08-12 NOTE — Progress Notes (Signed)
 Verbal consent given By Dr. Timmy to treat patient with creatine @1 .77

## 2024-08-12 NOTE — Patient Instructions (Signed)
 [facility] - A DEPT OF [facility]  Discharge Instructions: Thank you for choosing [facility] to provide your oncology and hematology care.   If you have a lab appointment with the Cancer Center, please go directly to the Cancer Center and check in at the registration area.  Wear comfortable clothing and clothing appropriate for easy access to any Portacath or PICC line.   We strive to give you quality time with your provider. You may need to reschedule your appointment if you arrive late (15 or more minutes).  Arriving late affects you and other patients whose appointments are after yours.  Also, if you miss three or more appointments without notifying the office, you may be dismissed from the clinic at the provider's discretion.      For prescription refill requests, have your pharmacy contact our office and allow 72 hours for refills to be completed.    Today you received the following chemotherapy and/or immunotherapy agents Faspro, Velcade       To help prevent nausea and vomiting after your treatment, we encourage you to take your nausea medication as directed.  BELOW ARE SYMPTOMS THAT SHOULD BE REPORTED IMMEDIATELY: *FEVER GREATER THAN 100.4 F (38 C) OR HIGHER *CHILLS OR SWEATING *NAUSEA AND VOMITING THAT IS NOT CONTROLLED WITH YOUR NAUSEA MEDICATION *UNUSUAL SHORTNESS OF BREATH *UNUSUAL BRUISING OR BLEEDING *URINARY PROBLEMS (pain or burning when urinating, or frequent urination) *BOWEL PROBLEMS (unusual diarrhea, constipation, pain near the anus) TENDERNESS IN MOUTH AND THROAT WITH OR WITHOUT PRESENCE OF ULCERS (sore throat, sores in mouth, or a toothache) UNUSUAL RASH, SWELLING OR PAIN  UNUSUAL VAGINAL DISCHARGE OR ITCHING   Items with * indicate a potential emergency and should be followed up as soon as possible or go to the Emergency Department if any problems should occur.  Please show the CHEMOTHERAPY ALERT CARD or  IMMUNOTHERAPY ALERT CARD at check-in to the Emergency Department and triage nurse. Should you have questions after your visit or need to cancel or reschedule your appointment, please contact [facility] - A DEPT OF [facility]  [phone] and follow the prompts.  Office hours are 8:00 a.m. to 4:30 p.m. Monday - Friday. Please note that voicemails left after 4:00 p.m. may not be returned until the following business day.  We are closed weekends and major holidays. You have access to a nurse at all times for urgent questions. Please call the main number to the clinic [phone] and follow the prompts.  For any non-urgent questions, you may also contact your provider using MyChart. We now offer e-Visits for anyone 41 and older to request care online for non-urgent symptoms. For details visit [email or web address].   Also download the MyChart app! Go to the app store, search MyChart, open the app, select [facility], and log in with your MyChart username and password.

## 2024-08-13 ENCOUNTER — Ambulatory Visit (INDEPENDENT_AMBULATORY_CARE_PROVIDER_SITE_OTHER): Admitting: Pharmacist

## 2024-08-13 DIAGNOSIS — Z7985 Long-term (current) use of injectable non-insulin antidiabetic drugs: Secondary | ICD-10-CM

## 2024-08-13 DIAGNOSIS — E1122 Type 2 diabetes mellitus with diabetic chronic kidney disease: Secondary | ICD-10-CM

## 2024-08-13 NOTE — Progress Notes (Signed)
 Pharmacy Note  08/13/2024 Name: Michael Couser Sr. MRN: [id number] DOB: [birth year]  Subjective: Michael Durbin Sr. is a 78 y.o. year old male who is a primary care patient of [name], Michael BROCKS, MD. Clinical Pharmacist Practitioner referral was placed to assist with medication, CHF and diabetes management.     Medication Management:  Patient has been getting Entresto  and Jardiance  thru the [employer] fund - approved thru 06/17/2025  He has no cost related to Entresto  and Jardiance  at this time.   He was approved for [employer] medication assistance program for Ozempic  01/09/2024 thru 12/17/2024    Type 2 DM -  Current therapy - Jardiance  25mg  daily and Ozempic  0.5mg  weekly.  Checking blood glucose at home 1 or 2 times per day.  He has started chemo treatments for multiple myeloma and has to premedication once a week with dexamethasone . Since he has started dexamethasone  he has notice that his blood glucose is a little higher the day after his dose.  Morning blood glucose has been 120-137 and afternoon blood glucose 87-133  Wt Readings from Last 3 Encounters:  08/05/24 205 lb (93 kg)  07/29/24 203 lb 1.3 oz (92.1 kg)  06/16/24 202 lb (91.6 kg)    Denies hypoglycemia and signs of hyperglycemia.  He reports his weight has been stable over the last 3 months; Even a slight increase per last 3 encounter weights.   Objective: Review of patient status, including review of consultants reports, laboratory and other test data, was performed as part of comprehensive evaluation and provision of chronic care management services.   BP Readings from Last 3 Encounters:  08/12/24 123/71  08/05/24 117/87  07/29/24 (!) 149/81     Lab Results  Component Value Date   CREATININE 1.77 (H) 08/12/2024   CREATININE 1.70 (H) 08/05/2024   CREATININE 1.70 (H) 07/29/2024    Lab Results  Component Value Date   HGBA1C 9.4 (H) 02/25/2024       Component Value Date/Time   CHOL 116  02/25/2024 1426   CHOL 151 12/26/2017 0847   TRIG 143.0 02/25/2024 1426   HDL 27.70 (L) 02/25/2024 1426   HDL 34 (L) 12/26/2017 0847   CHOLHDL 4 02/25/2024 1426   VLDL 28.6 02/25/2024 1426   LDLCALC 59 02/25/2024 1426   LDLCALC 104 (H) 12/26/2017 0847     Clinical ASCVD: Yes  The ASCVD Risk score (Arnett DK, et al., 2019) failed to calculate for the following reasons:   The valid total cholesterol range is 130 to 320 mg/dL    BP Readings from Last 3 Encounters:  08/12/24 123/71  08/05/24 117/87  07/29/24 (!) 149/81    Allergies  Allergen Reactions   Amlodipine  Swelling   Atorvastatin      Other Reaction(s): hard to focus   Rosuvastatin Other (See Comments)    Per pt unable to focus  Other Reaction(s): hard to focus    Medications Reviewed Today   Medications were not reviewed in this encounter     Patient Active Problem List   Diagnosis Date Noted   Chronic kidney disease (CKD), active medical management without dialysis 10/09/2019   Iron  deficiency anemia due to chronic blood loss 10/02/2019   Chronic systolic (congestive) heart failure (HCC) 01/04/2018   Pacemaker 12/08/2015   Mobitz type 2 second degree heart block 08/08/2015   Right bundle branch block 12/16/2014   Syncope 12/16/2014   Proteinuria due to type 2 diabetes mellitus (HCC) 11/12/2014   Encounter for therapeutic drug  monitoring 03/13/2013   Multiple myeloma (HCC) 02/15/2013   Edema 01/07/2013   Diabetes mellitus due to underlying condition with other diabetic kidney complication (HCC)    Hyperlipidemia    Hypertension    MGUS (monoclonal gammopathy of unknown significance)    GERD (gastroesophageal reflux disease)       Assessment / Plan: Type 2 DM- A1c not at goal but per patient his blood glucose has improved at home; only checking fasting blood glucose.  Continue current therapy - Ozempic  0.5mg  weekly and Jardiance  25mg  daily.  Reviewed home blood glucose goals. Patient will check 1 or 2  times per day at varying times of day. If blood glucose > 180 he will contact office.   Medication Assistance:   Continue to use [employer] for cardiomyopathy for cost of Jardiance  and Entresto   Great is good thru 06/17/2025  Approved for [employer] Nordisk medication assistance program for Ozempic  thru 12/17/2024. Received last shipment 08/11/2024 - patient plans to pick up on Friday 8/29  Reviewed medication list and discussed adherence.   Follow up planned in 6 to 8 weeks; Sees PCP in 1 week  Madelin Ray, PharmD Clinical Pharmacist [facility]  - [facility] [phone]

## 2024-08-19 ENCOUNTER — Encounter: Payer: Self-pay | Admitting: Hematology & Oncology

## 2024-08-19 ENCOUNTER — Inpatient Hospital Stay: Admitting: Hematology & Oncology

## 2024-08-19 ENCOUNTER — Inpatient Hospital Stay

## 2024-08-19 ENCOUNTER — Inpatient Hospital Stay: Attending: Hematology & Oncology

## 2024-08-19 VITALS — BP 118/72 | HR 60 | Temp 98.2°F | Resp 18 | Ht 75.0 in | Wt 204.8 lb

## 2024-08-19 DIAGNOSIS — K5900 Constipation, unspecified: Secondary | ICD-10-CM | POA: Diagnosis not present

## 2024-08-19 DIAGNOSIS — C9002 Multiple myeloma in relapse: Secondary | ICD-10-CM

## 2024-08-19 DIAGNOSIS — D509 Iron deficiency anemia, unspecified: Secondary | ICD-10-CM | POA: Diagnosis not present

## 2024-08-19 DIAGNOSIS — Z5112 Encounter for antineoplastic immunotherapy: Secondary | ICD-10-CM | POA: Diagnosis not present

## 2024-08-19 DIAGNOSIS — Z79899 Other long term (current) drug therapy: Secondary | ICD-10-CM | POA: Diagnosis not present

## 2024-08-19 DIAGNOSIS — R5383 Other fatigue: Secondary | ICD-10-CM | POA: Insufficient documentation

## 2024-08-19 LAB — CBC WITH DIFFERENTIAL (CANCER CENTER ONLY)
Abs Immature Granulocytes: 0.02 K/uL (ref 0.00–0.07)
Basophils Absolute: 0 K/uL (ref 0.0–0.1)
Basophils Relative: 1 %
Eosinophils Absolute: 0.3 K/uL (ref 0.0–0.5)
Eosinophils Relative: 5 %
HCT: 34.8 % — ABNORMAL LOW (ref 39.0–52.0)
Hemoglobin: 11.7 g/dL — ABNORMAL LOW (ref 13.0–17.0)
Immature Granulocytes: 0 %
Lymphocytes Relative: 13 %
Lymphs Abs: 0.7 K/uL (ref 0.7–4.0)
MCH: 30.2 pg (ref 26.0–34.0)
MCHC: 33.6 g/dL (ref 30.0–36.0)
MCV: 89.9 fL (ref 80.0–100.0)
Monocytes Absolute: 1.1 K/uL — ABNORMAL HIGH (ref 0.1–1.0)
Monocytes Relative: 21 %
Neutro Abs: 3.1 K/uL (ref 1.7–7.7)
Neutrophils Relative %: 60 %
Platelet Count: 210 K/uL (ref 150–400)
RBC: 3.87 MIL/uL — ABNORMAL LOW (ref 4.22–5.81)
RDW: 14.2 % (ref 11.5–15.5)
WBC Count: 5.2 K/uL (ref 4.0–10.5)
nRBC: 0 % (ref 0.0–0.2)

## 2024-08-19 LAB — CMP (CANCER CENTER ONLY)
ALT: 32 U/L (ref 0–44)
AST: 23 U/L (ref 15–41)
Albumin: 3.8 g/dL (ref 3.5–5.0)
Alkaline Phosphatase: 78 U/L (ref 38–126)
Anion gap: 11 (ref 5–15)
BUN: 28 mg/dL — ABNORMAL HIGH (ref 8–23)
CO2: 21 mmol/L — ABNORMAL LOW (ref 22–32)
Calcium: 8.6 mg/dL — ABNORMAL LOW (ref 8.9–10.3)
Chloride: 102 mmol/L (ref 98–111)
Creatinine: 1.58 mg/dL — ABNORMAL HIGH (ref 0.61–1.24)
GFR, Estimated: 44 mL/min — ABNORMAL LOW (ref >60)
Glucose, Bld: 135 mg/dL — ABNORMAL HIGH (ref 70–99)
Potassium: 4.3 mmol/L (ref 3.5–5.1)
Sodium: 135 mmol/L (ref 135–145)
Total Bilirubin: 0.4 mg/dL (ref 0.0–1.2)
Total Protein: 6.9 g/dL (ref 6.5–8.1)

## 2024-08-19 LAB — LACTATE DEHYDROGENASE: LDH: 163 U/L (ref 98–192)

## 2024-08-19 MED ORDER — DARATUMUMAB-HYALURONIDASE-FIHJ 1800-30000 MG-UT/15ML ~~LOC~~ SOLN
1800.0000 mg | Freq: Once | SUBCUTANEOUS | Status: AC
  Administered 2024-08-19: 1800 mg via SUBCUTANEOUS
  Filled 2024-08-19: qty 15

## 2024-08-19 MED ORDER — BORTEZOMIB CHEMO SQ INJECTION 3.5 MG (2.5MG/ML)
1.3000 mg/m2 | Freq: Once | INTRAMUSCULAR | Status: AC
  Administered 2024-08-19: 2.75 mg via SUBCUTANEOUS
  Filled 2024-08-19: qty 1.1

## 2024-08-19 MED ORDER — ACETAMINOPHEN 325 MG PO TABS
650.0000 mg | ORAL_TABLET | Freq: Once | ORAL | Status: DC
Start: 2024-08-19 — End: 2024-08-19

## 2024-08-19 MED ORDER — DIPHENHYDRAMINE HCL 25 MG PO CAPS: 50.0000 mg | ORAL_CAPSULE | Freq: Once | ORAL | Status: DC

## 2024-08-19 MED ORDER — DEXAMETHASONE 4 MG PO TABS
20.0000 mg | ORAL_TABLET | Freq: Once | ORAL | Status: DC
Start: 2024-08-19 — End: 2024-08-19

## 2024-08-19 NOTE — Patient Instructions (Signed)
 Daratumumab; Hyaluronidase Injection What is this medication? DARATUMUMAB; HYALURONIDASE (dar a toom ue mab; hye al ur ON i dase) treats multiple myeloma, a type of bone marrow cancer. Daratumumab works by blocking a protein that causes cancer cells to grow and multiply. This helps to slow or stop the spread of cancer cells. Hyaluronidase works by increasing the absorption of other medications in the body to help them work better. This medication may also be used treat amyloidosis, a condition that causes the buildup of a protein (amyloid) in your body. It works by reducing the buildup of this protein, which decreases symptoms. It is a combination medication that contains a monoclonal antibody. This medicine may be used for other purposes; ask your health care provider or pharmacist if you have questions. COMMON BRAND NAME(S): DARZALEX FASPRO What should I tell my care team before I take this medication? They need to know if you have any of these conditions: Heart disease Infection, such as chickenpox, cold sores, herpes, hepatitis B Lung or breathing disease An unusual or allergic reaction to daratumumab, hyaluronidase, other medications, foods, dyes, or preservatives Pregnant or trying to get pregnant Breast-feeding How should I use this medication? This medication is injected under the skin. It is given by your care team in a hospital or clinic setting. Talk to your care team about the use of this medication in children. Special care may be needed. Overdosage: If you think you have taken too much of this medicine contact a poison control center or emergency room at once. NOTE: This medicine is only for you. Do not share this medicine with others. What if I miss a dose? Keep appointments for follow-up doses. It is important not to miss your dose. Call your care team if you are unable to keep an appointment. What may interact with this medication? Interactions have not been studied. This list  may not describe all possible interactions. Give your health care provider a list of all the medicines, herbs, non-prescription drugs, or dietary supplements you use. Also tell them if you smoke, drink alcohol, or use illegal drugs. Some items may interact with your medicine. What should I watch for while using this medication? Your condition will be monitored carefully while you are receiving this medication. This medication can cause serious allergic reactions. To reduce your risk, your care team may give you other medication to take before receiving this one. Be sure to follow the directions from your care team. This medication can affect the results of blood tests to match your blood type. These changes can last for up to 6 months after the final dose. Your care team will do blood tests to match your blood type before you start treatment. Tell all of your care team that you are being treated with this medication before receiving a blood transfusion. This medication can affect the results of some tests used to determine treatment response; extra tests may be needed to evaluate response. Talk to your care team if you wish to become pregnant or think you are pregnant. This medication can cause serious birth defects if taken during pregnancy and for 3 months after the last dose. A reliable form of contraception is recommended while taking this medication and for 3 months after the last dose. Talk to your care team about effective forms of contraception. Do not breast-feed while taking this medication. What side effects may I notice from receiving this medication? Side effects that you should report to your care team as soon as  possible: Allergic reactions--skin rash, itching, hives, swelling of the face, lips, tongue, or throat Heart rhythm changes--fast or irregular heartbeat, dizziness, feeling faint or lightheaded, chest pain, trouble breathing Infection--fever, chills, cough, sore throat, wounds that  don't heal, pain or trouble when passing urine, general feeling of discomfort or being unwell Infusion reactions--chest pain, shortness of breath or trouble breathing, feeling faint or lightheaded Sudden eye pain or change in vision such as blurry vision, seeing halos around lights, vision loss Unusual bruising or bleeding Side effects that usually do not require medical attention (report to your care team if they continue or are bothersome): Constipation Diarrhea Fatigue Nausea Pain, tingling, or numbness in the hands or feet Swelling of the ankles, hands, or feet This list may not describe all possible side effects. Call your doctor for medical advice about side effects. You may report side effects to FDA at 1-800-FDA-1088. Where should I keep my medication? This medication is given in a hospital or clinic. It will not be stored at home. NOTE: This sheet is a summary. It may not cover all possible information. If you have questions about this medicine, talk to your doctor, pharmacist, or health care provider.  2024 Elsevier/Gold Standard (2022-04-11 00:00:00)Bortezomib Injection What is this medication? BORTEZOMIB (bor TEZ oh mib) treats lymphoma. It may also be used to treat multiple myeloma, a type of bone marrow cancer. It works by blocking a protein that causes cancer cells to grow and multiply. This helps to slow or stop the spread of cancer cells. This medicine may be used for other purposes; ask your health care provider or pharmacist if you have questions. COMMON BRAND NAME(S): BORUZU, Velcade What should I tell my care team before I take this medication? They need to know if you have any of these conditions: Dehydration Diabetes Heart disease Liver disease Tingling of the fingers or toes or other nerve disorder An unusual or allergic reaction to bortezomib, other medications, foods, dyes, or preservatives If you or your partner are pregnant or trying to get  pregnant Breastfeeding How should I use this medication? This medication is injected into a vein or under the skin. It is given by your care team in a hospital or clinic setting. Talk to your care team about the use of this medication in children. Special care may be needed. Overdosage: If you think you have taken too much of this medicine contact a poison control center or emergency room at once. NOTE: This medicine is only for you. Do not share this medicine with others. What if I miss a dose? Keep appointments for follow-up doses. It is important not to miss your dose. Call your care team if you are unable to keep an appointment. What may interact with this medication? Ketoconazole Rifampin This list may not describe all possible interactions. Give your health care provider a list of all the medicines, herbs, non-prescription drugs, or dietary supplements you use. Also tell them if you smoke, drink alcohol, or use illegal drugs. Some items may interact with your medicine. What should I watch for while using this medication? Your condition will be monitored carefully while you are receiving this medication. You may need blood work while taking this medication. This medication may affect your coordination, reaction time, or judgment. Do not drive or operate machinery until you know how this medication affects you. Sit up or stand slowly to reduce the risk of dizzy or fainting spells. Drinking alcohol with this medication can increase the risk of these  side effects. This medication may increase your risk of getting an infection. Call your care team for advice if you get a fever, chills, sore throat, or other symptoms of a cold or flu. Do not treat yourself. Try to avoid being around people who are sick. Check with your care team if you have severe diarrhea, nausea, and vomiting, or if you sweat a lot. The loss of too much body fluid may make it dangerous for you to take this medication. Talk to  your care team if you may be pregnant. Serious birth defects can occur if you take this medication during pregnancy and for 7 months after the last dose. You will need a negative pregnancy test before starting this medication. Contraception is recommended while taking this medication and for 7 months after the last dose. Your care team can help you find the option that works for you. If your partner can get pregnant, use a condom during sex while taking this medication and for 4 months after the last dose. Do not breastfeed while taking this medication and for 2 months after the last dose. This medication may cause infertility. Talk to your care team if you are concerned about your fertility. What side effects may I notice from receiving this medication? Side effects that you should report to your care team as soon as possible: Allergic reactions--skin rash, itching, hives, swelling of the face, lips, tongue, or throat Bleeding--bloody or black, tar-like stools, vomiting blood or brown material that looks like coffee grounds, red or dark brown urine, small red or purple spots on skin, unusual bruising or bleeding Bleeding in the brain--severe headache, stiff neck, confusion, dizziness, change in vision, numbness or weakness of the face, arm, or leg, trouble speaking, trouble walking, vomiting Bowel blockage--stomach cramping, unable to have a bowel movement or pass gas, loss of appetite, vomiting Heart failure--shortness of breath, swelling of the ankles, feet, or hands, sudden weight gain, unusual weakness or fatigue Infection--fever, chills, cough, sore throat, wounds that don't heal, pain or trouble when passing urine, general feeling of discomfort or being unwell Liver injury--right upper belly pain, loss of appetite, nausea, light-colored stool, dark yellow or brown urine, yellowing skin or eyes, unusual weakness or fatigue Low blood pressure--dizziness, feeling faint or lightheaded, blurry  vision Lung injury--shortness of breath or trouble breathing, cough, spitting up blood, chest pain, fever Pain, tingling, or numbness in the hands or feet Severe or prolonged diarrhea Stomach pain, bloody diarrhea, pale skin, unusual weakness or fatigue, decrease in the amount of urine, which may be signs of hemolytic uremic syndrome Sudden and severe headache, confusion, change in vision, seizures, which may be signs of posterior reversible encephalopathy syndrome (PRES) TTP--purple spots on the skin or inside the mouth, pale skin, yellowing skin or eyes, unusual weakness or fatigue, fever, fast or irregular heartbeat, confusion, change in vision, trouble speaking, trouble walking Tumor lysis syndrome (TLS)--nausea, vomiting, diarrhea, decrease in the amount of urine, dark urine, unusual weakness or fatigue, confusion, muscle pain or cramps, fast or irregular heartbeat, joint pain Side effects that usually do not require medical attention (report to your care team if they continue or are bothersome): Constipation Diarrhea Fatigue Loss of appetite Nausea This list may not describe all possible side effects. Call your doctor for medical advice about side effects. You may report side effects to FDA at 1-800-FDA-1088. Where should I keep my medication? This medication is given in a hospital or clinic. It will not be stored at home. NOTE:  This sheet is a summary. It may not cover all possible information. If you have questions about this medicine, talk to your doctor, pharmacist, or health care provider.  2024 Elsevier/Gold Standard (2022-05-09 00:00:00)

## 2024-08-19 NOTE — Progress Notes (Signed)
 Hematology and Oncology Follow Up Visit  Michael Rawdon Sr. [id number] 11-12-1946 78 y.o. 08/19/2024   Principle Diagnosis:  IgG kappa smoldering myeloma - progressive Iron  deficiency anemia -- Venofer  given on 03/2020  Current Therapy:   Faspro/Velcade /Revlimid /Dex -- s/p cycle #1 - start - on 07/29/2024     Interim History:  Michael Wiggins is back for treatment.  He seemed to tolerate the first cycle pretty well.  He does have some fatigue.  He does feel a bit more tired.  I suspect this probably is going to be secondary to the Revlimid .  I think that if he has a good response, we might build to decrease the road but to 14 days on and 14 days off.  Will restart his treatment, his monoclonal spike was 1.6 g/dL.  His IgG level was 2800 mg/dL.  The Kappa light chain was 14.3 mg/dL.  He has had no fever.  He has had no bleeding.  He has had some constipation.  I told him to try some MiraLAX .  He has had no cough.  He has had no leg swelling.  He has had no rashes.  Overall, I would have to say that his performance status is probably ECOG 1.   Medications:  Current Outpatient Medications:    Accu-Chek Softclix Lancets lancets, Use to check blood sugar once daily Dx code: E11.9, Disp: 100 each, Rfl: 1   acetaminophen  (TYLENOL ) 500 MG tablet, Take 1,000 mg by mouth., Disp: , Rfl:    albuterol  (VENTOLIN  HFA) 108 (90 Base) MCG/ACT inhaler, Inhale 1-2 puffs into the lungs every 6 (six) hours as needed for wheezing or shortness of breath., Disp: 8.5 each, Rfl: 6   aspirin  325 MG tablet, Take 1 tablet (325 mg total) by mouth daily., Disp: 100 tablet, Rfl: 3   atorvastatin  (LIPITOR) 20 MG tablet, Take 1 tablet (20 mg total) by mouth daily., Disp: 90 tablet, Rfl: 3   Blood Glucose Monitoring Suppl (ACCU-CHEK GUIDE) w/Device KIT, Use to check blood sugar once daily Dx code: E11.9, Disp: 1 kit, Rfl: 0   dexamethasone  (DECADRON ) 4 MG tablet, Take 5 tabs (20 mg) weekly the day after daratumumab  for 12  weeks. Take with breakfast., Disp: 20 tablet, Rfl: 5   DHA-EPA-Vitamin E (OMEGA-3 COMPLEX) 192-251-11 MG-MG-UNIT CAPS, Take 1 capsule by mouth every other day., Disp: , Rfl:    ENTRESTO  49-51 MG, TAKE 1 TABLET BY MOUTH TWICE A DAY (Patient taking differently: TAKE 1 TABLET BY MOUTH TWICE A DAY), Disp: 180 tablet, Rfl: 1   famciclovir  (FAMVIR ) 250 MG tablet, Take 1 tablet (250 mg total) by mouth 2 (two) times daily. Please start this 3 days prior to starting chemotherapy in August., Disp: 30 tablet, Rfl: 12   ferrous sulfate  325 (65 FE) MG tablet, Take 325 mg by mouth every other day., Disp: , Rfl:    finasteride (PROSCAR) 5 MG tablet, Take 5 mg by mouth daily., Disp: , Rfl:    Ginkgo Biloba 40 MG TABS, Take 1 tablet by mouth daily. , Disp: , Rfl:    glucosamine-chondroitin 500-400 MG tablet, Take 1 tablet by mouth daily., Disp: , Rfl:    glucose blood (ACCU-CHEK GUIDE TEST) test strip, Use to check blood sugar once daily Dx code: E11.9, Disp: 100 each, Rfl: 1   JARDIANCE  25 MG TABS tablet, TAKE 1 TABLET BY MOUTH DAILY BEFORE BREAKFAST., Disp: 90 tablet, Rfl: 1   latanoprost  (XALATAN ) 0.005 % ophthalmic solution, Place 1 drop into both eyes  at bedtime. , Disp: , Rfl:    lenalidomide  (REVLIMID ) 10 MG capsule, Take 1 capsule (10 mg total) by mouth daily. Take for 21 days on, 7 days off. Repeat every 28 days. [employer] Auth # [id number]; Date Obtained 07/16/24, Disp: 21 capsule, Rfl: 0   montelukast  (SINGULAIR ) 10 MG tablet, Take 1 tablet 1 hour to Darzalex  (Faspro), Disp: 30 tablet, Rfl: 2   Multiple Vitamin (MULTIVITAMIN WITH MINERALS) TABS, Take 1 tablet by mouth daily. , Disp: , Rfl:    ondansetron  (ZOFRAN ) 8 MG tablet, Take 1 tablet (8 mg total) by mouth every 8 (eight) hours as needed for nausea or vomiting., Disp: 30 tablet, Rfl: 1   prochlorperazine  (COMPAZINE ) 10 MG tablet, Take 1 tablet (10 mg total) by mouth every 6 (six) hours as needed for nausea or vomiting., Disp: 30 tablet, Rfl: 1    Semaglutide ,0.25 or 0.5MG /DOS, (OZEMPIC , 0.25 OR 0.5 MG/DOSE,) 2 MG/3ML SOPN, Inject 0.5 mg into the skin once a week., Disp: , Rfl:    sodium bicarbonate 650 MG tablet, Take 650 mg by mouth daily., Disp: , Rfl:    tamsulosin (FLOMAX) 0.4 MG CAPS capsule, Take 2 capsules (0.8 mg total) by mouth at bedtime., Disp: , Rfl:    carvedilol  (COREG ) 25 MG tablet, TAKE 1 AND 1/2 TABLETS (37.5 MG TOTAL) BY MOUTH 2 (TWO) TIMES DAILY WITH A MEAL. (Patient not taking: Reported on 08/19/2024), Disp: 270 tablet, Rfl: 2  Allergies:  Allergies  Allergen Reactions   Atorvastatin  Other (See Comments)     hard to focus   Rosuvastatin Other (See Comments)    unable to focus   Amlodipine  Swelling    Past Medical History, Surgical history, Social history, and Family History were reviewed and updated.  Review of Systems: Review of Systems  Constitutional: Negative.   HENT: Negative.    Eyes: Negative.   Respiratory: Negative.    Cardiovascular: Negative.   Gastrointestinal: Negative.   Genitourinary: Negative.   Musculoskeletal: Negative.   Skin: Negative.   Neurological: Negative.   Endo/Heme/Allergies: Negative.   Psychiatric/Behavioral: Negative.       Physical Exam: Vital signs are temperature of 98.2.  Pulse 60.  Blood pressure 118/72.  Weight is 204 pounds.   Physical Exam Vitals reviewed.  HENT:     Head: Normocephalic and atraumatic.  Eyes:     Pupils: Pupils are equal, round, and reactive to light.  Cardiovascular:     Rate and Rhythm: Normal rate and regular rhythm.     Heart sounds: Normal heart sounds.  Pulmonary:     Effort: Pulmonary effort is normal.     Breath sounds: Normal breath sounds.  Abdominal:     General: Bowel sounds are normal.     Palpations: Abdomen is soft.  Musculoskeletal:        General: No tenderness or deformity. Normal range of motion.     Cervical back: Normal range of motion.  Lymphadenopathy:     Cervical: No cervical adenopathy.  Skin:     General: Skin is warm and dry.     Findings: No erythema or rash.  Neurological:     Mental Status: He is alert and oriented to person, place, and time.  Psychiatric:        Behavior: Behavior normal.        Thought Content: Thought content normal.        Judgment: Judgment normal.      Lab Results  Component Value Date   WBC  5.2 08/19/2024   HGB 11.7 (L) 08/19/2024   HCT 34.8 (L) 08/19/2024   MCV 89.9 08/19/2024   PLT 210 08/19/2024     Chemistry      Component Value Date/Time   NA 133 (L) 08/12/2024 1123   NA 133 (L) 01/12/2020 1015   NA 142 10/03/2017 0910   NA 137 04/03/2017 0852   K 4.3 08/12/2024 1123   K 3.9 10/03/2017 0910   K 4.2 04/03/2017 0852   CL 101 08/12/2024 1123   CL 103 10/03/2017 0910   CL 108 (H) 05/15/2013 0912   CO2 21 (L) 08/12/2024 1123   CO2 28 10/03/2017 0910   CO2 26 04/03/2017 0852   BUN 26 (H) 08/12/2024 1123   BUN 25 01/12/2020 1015   BUN 13 10/03/2017 0910   BUN 16.3 04/03/2017 0852   CREATININE 1.77 (H) 08/12/2024 1123   CREATININE 1.07 11/16/2017 1437   CREATININE 1.0 04/03/2017 0852      Component Value Date/Time   CALCIUM  8.5 (L) 08/12/2024 1123   CALCIUM  9.3 10/03/2017 0910   CALCIUM  9.0 04/03/2017 0852   ALKPHOS 66 08/12/2024 1123   ALKPHOS 51 10/03/2017 0910   ALKPHOS 46 04/03/2017 0852   AST 23 08/12/2024 1123   AST 18 04/03/2017 0852   ALT 39 08/12/2024 1123   ALT 34 10/03/2017 0910   ALT 19 04/03/2017 0852   BILITOT 0.3 08/12/2024 1123   BILITOT 0.38 04/03/2017 0852     Impression and Plan: Michael Wiggins is 78 year old gentleman with IgG kappa smoldering myeloma.  I have been watching him for several years.  Again, his monoclonal studies have been slowly rising.  I think we will have to get treatment started.    I have to believe that he is responding.  Again, we may have to make a change with the Revlimid .  For right now, we will go ahead with his second cycle of treatment.  We will see what his monoclonal studies  look like.  I will have him come back to see us  in another month for cycle #3.   Maude JONELLE Crease, MD 9/2/202511:17 AM

## 2024-08-19 NOTE — Progress Notes (Signed)
 Verbal consent given by Dr. Timmy to treat patient  with  Creat at 1.58.

## 2024-08-20 ENCOUNTER — Encounter: Payer: Self-pay | Admitting: Hematology & Oncology

## 2024-08-20 ENCOUNTER — Other Ambulatory Visit: Payer: Self-pay

## 2024-08-20 ENCOUNTER — Ambulatory Visit: Payer: Self-pay | Admitting: Hematology & Oncology

## 2024-08-20 DIAGNOSIS — C9000 Multiple myeloma not having achieved remission: Secondary | ICD-10-CM

## 2024-08-20 LAB — KAPPA/LAMBDA LIGHT CHAINS
Kappa free light chain: 44.2 mg/L — ABNORMAL HIGH (ref 3.3–19.4)
Kappa, lambda light chain ratio: 1.86 — ABNORMAL HIGH (ref 0.26–1.65)
Lambda free light chains: 23.7 mg/L (ref 5.7–26.3)

## 2024-08-20 LAB — IGG, IGA, IGM
IgA: 152 mg/dL (ref 61–437)
IgG (Immunoglobin G), Serum: 1639 mg/dL — ABNORMAL HIGH (ref 603–1613)
IgM (Immunoglobulin M), Srm: 18 mg/dL (ref 15–143)

## 2024-08-20 MED ORDER — LENALIDOMIDE 10 MG PO CAPS
10.0000 mg | ORAL_CAPSULE | Freq: Every day | ORAL | 0 refills | Status: AC
Start: 2024-08-20 — End: 2024-09-03

## 2024-08-22 LAB — PROTEIN ELECTROPHORESIS, SERUM, WITH REFLEX
A/G Ratio: 1 (ref 0.7–1.7)
Albumin ELP: 3.2 g/dL (ref 2.9–4.4)
Alpha-1-Globulin: 0.3 g/dL (ref 0.0–0.4)
Alpha-2-Globulin: 0.8 g/dL (ref 0.4–1.0)
Beta Globulin: 0.8 g/dL (ref 0.7–1.3)
Gamma Globulin: 1.4 g/dL (ref 0.4–1.8)
Globulin, Total: 3.2 g/dL (ref 2.2–3.9)
M-Spike, %: 0.9 g/dL — ABNORMAL HIGH
SPEP Interpretation: 0
Total Protein ELP: 6.4 g/dL (ref 6.0–8.5)

## 2024-08-22 LAB — IMMUNOFIXATION REFLEX, SERUM
IgA: 153 mg/dL (ref 61–437)
IgG (Immunoglobin G), Serum: 1682 mg/dL — ABNORMAL HIGH (ref 603–1613)
IgM (Immunoglobulin M), Srm: 19 mg/dL (ref 15–143)

## 2024-08-23 ENCOUNTER — Other Ambulatory Visit: Payer: Self-pay

## 2024-08-26 ENCOUNTER — Other Ambulatory Visit: Payer: Self-pay | Admitting: Family Medicine

## 2024-08-26 ENCOUNTER — Inpatient Hospital Stay

## 2024-08-26 VITALS — BP 116/87 | HR 85 | Temp 97.7°F | Resp 18

## 2024-08-26 DIAGNOSIS — I5022 Chronic systolic (congestive) heart failure: Secondary | ICD-10-CM

## 2024-08-26 DIAGNOSIS — Z5112 Encounter for antineoplastic immunotherapy: Secondary | ICD-10-CM | POA: Diagnosis not present

## 2024-08-26 DIAGNOSIS — C9002 Multiple myeloma in relapse: Secondary | ICD-10-CM | POA: Diagnosis not present

## 2024-08-26 DIAGNOSIS — R5383 Other fatigue: Secondary | ICD-10-CM | POA: Diagnosis not present

## 2024-08-26 DIAGNOSIS — D509 Iron deficiency anemia, unspecified: Secondary | ICD-10-CM | POA: Diagnosis not present

## 2024-08-26 DIAGNOSIS — Z79899 Other long term (current) drug therapy: Secondary | ICD-10-CM | POA: Diagnosis not present

## 2024-08-26 DIAGNOSIS — K5900 Constipation, unspecified: Secondary | ICD-10-CM | POA: Diagnosis not present

## 2024-08-26 DIAGNOSIS — I10 Essential (primary) hypertension: Secondary | ICD-10-CM

## 2024-08-26 LAB — CBC WITH DIFFERENTIAL (CANCER CENTER ONLY)
Abs Immature Granulocytes: 0 K/uL (ref 0.00–0.07)
Basophils Absolute: 0.1 K/uL (ref 0.0–0.1)
Basophils Relative: 1 %
Eosinophils Absolute: 0.2 K/uL (ref 0.0–0.5)
Eosinophils Relative: 5 %
HCT: 37.3 % — ABNORMAL LOW (ref 39.0–52.0)
Hemoglobin: 12.4 g/dL — ABNORMAL LOW (ref 13.0–17.0)
Immature Granulocytes: 0 %
Lymphocytes Relative: 27 %
Lymphs Abs: 1.3 K/uL (ref 0.7–4.0)
MCH: 29.9 pg (ref 26.0–34.0)
MCHC: 33.2 g/dL (ref 30.0–36.0)
MCV: 89.9 fL (ref 80.0–100.0)
Monocytes Absolute: 0.8 K/uL (ref 0.1–1.0)
Monocytes Relative: 16 %
Neutro Abs: 2.5 K/uL (ref 1.7–7.7)
Neutrophils Relative %: 51 %
Platelet Count: 279 K/uL (ref 150–400)
RBC: 4.15 MIL/uL — ABNORMAL LOW (ref 4.22–5.81)
RDW: 14 % (ref 11.5–15.5)
WBC Count: 4.9 K/uL (ref 4.0–10.5)
nRBC: 0 % (ref 0.0–0.2)

## 2024-08-26 LAB — CMP (CANCER CENTER ONLY)
ALT: 32 U/L (ref 0–44)
AST: 24 U/L (ref 15–41)
Albumin: 3.9 g/dL (ref 3.5–5.0)
Alkaline Phosphatase: 77 U/L (ref 38–126)
Anion gap: 10 (ref 5–15)
BUN: 26 mg/dL — ABNORMAL HIGH (ref 8–23)
CO2: 22 mmol/L (ref 22–32)
Calcium: 8.9 mg/dL (ref 8.9–10.3)
Chloride: 104 mmol/L (ref 98–111)
Creatinine: 1.63 mg/dL — ABNORMAL HIGH (ref 0.61–1.24)
GFR, Estimated: 43 mL/min — ABNORMAL LOW (ref >60)
Glucose, Bld: 137 mg/dL — ABNORMAL HIGH (ref 70–99)
Potassium: 5.2 mmol/L — ABNORMAL HIGH (ref 3.5–5.1)
Sodium: 135 mmol/L (ref 135–145)
Total Bilirubin: 0.3 mg/dL (ref 0.0–1.2)
Total Protein: 6.8 g/dL (ref 6.5–8.1)

## 2024-08-26 MED ORDER — DEXAMETHASONE 4 MG PO TABS: 20.0000 mg | ORAL_TABLET | Freq: Once | ORAL | Status: DC

## 2024-08-26 MED ORDER — DARATUMUMAB-HYALURONIDASE-FIHJ 1800-30000 MG-UT/15ML ~~LOC~~ SOLN
1800.0000 mg | Freq: Once | SUBCUTANEOUS | Status: AC
  Administered 2024-08-26: 1800 mg via SUBCUTANEOUS
  Filled 2024-08-26: qty 15

## 2024-08-26 MED ORDER — BORTEZOMIB CHEMO SQ INJECTION 3.5 MG (2.5MG/ML)
1.3000 mg/m2 | Freq: Once | INTRAMUSCULAR | Status: AC
  Administered 2024-08-26: 2.75 mg via SUBCUTANEOUS
  Filled 2024-08-26: qty 1.1

## 2024-08-26 MED ORDER — DIPHENHYDRAMINE HCL 25 MG PO CAPS: 50.0000 mg | ORAL_CAPSULE | Freq: Once | ORAL | Status: DC

## 2024-08-26 MED ORDER — ACETAMINOPHEN 325 MG PO TABS: 650.0000 mg | ORAL_TABLET | Freq: Once | ORAL | Status: DC

## 2024-08-26 NOTE — Patient Instructions (Incomplete)
 It was good to see you again today as always!  Please try miralax  as needed for constipation Flu shot today Pick up ozempic !   I will be in touch with your labs asap

## 2024-08-26 NOTE — Progress Notes (Signed)
OK to treat with creat-1.63 per order of Dr. Marin Olp.

## 2024-08-26 NOTE — Progress Notes (Addendum)
 [facility] [address], [address] [place], [place] [address] [phone]  Date:  08/27/2024   Name:  Michael Scott Sr.   DOB:  [birth year]   MRN:  [id number]  PCP:  Watt Harlene BROCKS, MD    Chief Complaint: No chief complaint on file.   History of Present Illness:  Michael Celmer Sr. is a 78 y.o. very pleasant male patient who presents with the following:   Patient seen today for periodic follow-up.  I saw him most recently in March History of diabetes, proteinuria, chronic kidney disease, CHF, hypertension, Mobitz 2 heart block with pacemaker, hyperlipidemia, MGUS/multiple myeloma and iron  deficiency anemia, primary open-angle glaucoma   He is followed by hematology, cardiology, nephrology and also urology Our pharmacist Madelin Purple has been helping monitor his diabetes  Lab Results  Component Value Date   HGBA1C 9.4 (H) 02/25/2024   His last A1c level was elevated-he preferred not to increase his Ozempic  however due to side effects with higher dosage.  I believe he is still taking semaglutide  0.5 mg Also taking Entresto , Flomax, Singulair , Jardiance  25, atorvastatin , aspirin   He saw his oncologist, Dr. Timmy last week-recently his IgG kappa smoldering myeloma has become more progressive and he has been started on treatment: Faspro/Velcade /Revlimid /Dex  Per notes he is tolerating treatment well and his protein spikes have shown improvement  Seen by nephrology, Dr. Jerrye in July.  Continue Entresto , Jardiance   Need to update A1c today Eye exam Flu shot- give today  Recommend COVID booster Needs foot exam- update today  Needs urine micro  Discussed the use of AI scribe software for clinical note transcription with the patient, who gave verbal consent to proceed.  History of Present Illness Michael Tippin Sr. is a 78 year old male with diabetes and GERD who presents for follow-up and A1c check.  He is here for a  follow-up visit to check his A1c levels, which were elevated in March. He has been on semaglutide , which initially caused stomach upset but is now better tolerated. However, he is experiencing constipation, which he attributes to his medications and is managing with Miralax .  His GERD symptoms have worsened, and he is unable to take Pepto-Bismol due to its constipating effects. He is concerned about his immune system but continues to consume salads. He is considering getting a flu shot and a COVID-19 vaccine.  He is on Revlimid , which was recently adjusted from a 21-day cycle to a 14-day cycle. He reports significant fatigue and difficulty judging when he is tired, especially while working. He describes feeling like he is being 'poisoned' by the medication but is willing to continue if it helps reduce his symptoms.  He experiences neuropathy in his feet, describing them as 'tingly' rather than painful or numb. He wears compression socks, which he finds effective. He also notes an increase in his pulse rate since starting his current treatment regimen, with his heart rate seldom dropping below 80 or 72, even at rest.  He remains active, recently moving 900 pounds of wood while working on a deck.   Pulse Readings from Last 3 Encounters:  08/27/24 66  08/26/24 85  08/19/24 60   Wt Readings from Last 3 Encounters:  08/27/24 205 lb 9.6 oz (93.3 kg)  08/19/24 204 lb 12.8 oz (92.9 kg)  08/05/24 205 lb (93 kg)     Patient Active Problem List   Diagnosis Date Noted   Chronic kidney disease (  CKD), active medical management without dialysis 10/09/2019   Iron  deficiency anemia due to chronic blood loss 10/02/2019   Chronic systolic (congestive) heart failure (HCC) 01/04/2018   Pacemaker 12/08/2015   Mobitz type 2 second degree heart block 08/08/2015   Right bundle branch block 12/16/2014   Syncope 12/16/2014   Proteinuria due to type 2 diabetes mellitus (HCC) 11/12/2014   Encounter for  therapeutic drug monitoring 03/13/2013   Multiple myeloma (HCC) 02/15/2013   Edema 01/07/2013   Diabetes mellitus due to underlying condition with other diabetic kidney complication (HCC)    Hyperlipidemia    Hypertension    MGUS (monoclonal gammopathy of unknown significance)    GERD (gastroesophageal reflux disease)     Past Medical History:  Diagnosis Date   Anemia    Cataract    Chronic systolic (congestive) heart failure (HCC)    Diabetes mellitus    GERD (gastroesophageal reflux disease)    Hyperlipidemia    Hypertension    Iron  deficiency anemia due to chronic blood loss 10/02/2019   MGUS (monoclonal gammopathy of unknown significance)    Multiple myeloma (HCC) 02/2013   normal cytogenetics and FISH panel on 03/11/2013.    Osteoarthritis    Presence of permanent cardiac pacemaker    Right bundle branch block    Syncope     Past Surgical History:  Procedure Laterality Date   BIV UPGRADE  01/04/2018   BIV UPGRADE N/A 01/04/2018   Procedure: BIVP UPGRADE;  Surgeon: Waddell Danelle ORN, MD;  Location: [facility] INVASIVE CV LAB;  Service: Cardiovascular;  Laterality: N/A;   EP IMPLANTABLE DEVICE N/A 08/09/2015   Procedure: Pacemaker Implant;  Surgeon: Danelle ORN Waddell, MD;  Location: [facility] INVASIVE CV LAB;  Service: Cardiovascular;  Laterality: N/A;   EYE SURGERY      Social History   Tobacco Use   Smoking status: Former    Current packs/day: 0.00    Average packs/day: 2.0 packs/day for 35.0 years (70.1 ttl pk-yrs)    Types: Cigarettes    Start date: 03/26/1963    Quit date: 12/18/1997    Years since quitting: 26.7   Smokeless tobacco: Never   Tobacco comments:    quit 15 years  Vaping Use   Vaping status: Never Used  Substance Use Topics   Alcohol use: No    Alcohol/week: 0.0 standard drinks of alcohol   Drug use: No    Family History  Problem Relation Age of Onset   Hypertension Mother    Cancer Mother    Stroke Mother    Hypertension Sister    Hypertension Brother     Hypertension Maternal Grandmother     Allergies  Allergen Reactions   Atorvastatin  Other (See Comments)     hard to focus   Rosuvastatin Other (See Comments)    unable to focus   Amlodipine  Swelling    Medication list has been reviewed and updated.  Current Outpatient Medications on File Prior to Visit  Medication Sig Dispense Refill   sacubitril -valsartan  (ENTRESTO ) 49-51 MG Take 1 tablet by mouth 2 (two) times daily. (Patient taking differently: Take 1 tablet by mouth daily.) 180 tablet 0   Accu-Chek Softclix Lancets lancets Use to check blood sugar once daily Dx code: E11.9 100 each 1   acetaminophen  (TYLENOL ) 500 MG tablet Take 1,000 mg by mouth.     albuterol  (VENTOLIN  HFA) 108 (90 Base) MCG/ACT inhaler Inhale 1-2 puffs into the lungs every 6 (six) hours as needed for wheezing or shortness  of breath. 8.5 each 6   aspirin  325 MG tablet Take 1 tablet (325 mg total) by mouth daily. 100 tablet 3   atorvastatin  (LIPITOR) 20 MG tablet Take 1 tablet (20 mg total) by mouth daily. 90 tablet 3   Blood Glucose Monitoring Suppl (ACCU-CHEK GUIDE) w/Device KIT Use to check blood sugar once daily Dx code: E11.9 1 kit 0   carvedilol  (COREG ) 25 MG tablet TAKE 1 AND 1/2 TABLETS (37.5 MG TOTAL) BY MOUTH 2 (TWO) TIMES DAILY WITH A MEAL. (Patient not taking: Reported on 08/27/2024) 270 tablet 2   dexamethasone  (DECADRON ) 4 MG tablet Take 5 tabs (20 mg) weekly the day after daratumumab  for 12 weeks. Take with breakfast. 20 tablet 5   DHA-EPA-Vitamin E (OMEGA-3 COMPLEX) 192-251-11 MG-MG-UNIT CAPS Take 1 capsule by mouth every other day.     famciclovir  (FAMVIR ) 250 MG tablet Take 1 tablet (250 mg total) by mouth 2 (two) times daily. Please start this 3 days prior to starting chemotherapy in August. 30 tablet 12   ferrous sulfate  325 (65 FE) MG tablet Take 325 mg by mouth every other day.     finasteride (PROSCAR) 5 MG tablet Take 5 mg by mouth daily.     Ginkgo Biloba 40 MG TABS Take 1 tablet by mouth  daily.      glucosamine-chondroitin 500-400 MG tablet Take 1 tablet by mouth daily.     glucose blood (ACCU-CHEK GUIDE TEST) test strip Use to check blood sugar once daily Dx code: E11.9 100 each 1   JARDIANCE  25 MG TABS tablet TAKE 1 TABLET BY MOUTH DAILY BEFORE BREAKFAST. 90 tablet 1   latanoprost  (XALATAN ) 0.005 % ophthalmic solution Place 1 drop into both eyes at bedtime.      lenalidomide  (REVLIMID ) 10 MG capsule Take 1 capsule (10 mg total) by mouth daily for 14 days. Take for 14 days on, 14 days off. Repeat every 28 days. [employer] Auth #[id number] ; Date Obtained 08/20/2024 14 capsule 0   montelukast  (SINGULAIR ) 10 MG tablet Take 1 tablet 1 hour to Darzalex  (Faspro) 30 tablet 2   Multiple Vitamin (MULTIVITAMIN WITH MINERALS) TABS Take 1 tablet by mouth daily.      ondansetron  (ZOFRAN ) 8 MG tablet Take 1 tablet (8 mg total) by mouth every 8 (eight) hours as needed for nausea or vomiting. 30 tablet 1   prochlorperazine  (COMPAZINE ) 10 MG tablet Take 1 tablet (10 mg total) by mouth every 6 (six) hours as needed for nausea or vomiting. 30 tablet 1   Semaglutide ,0.25 or 0.5MG /DOS, (OZEMPIC , 0.25 OR 0.5 MG/DOSE,) 2 MG/3ML SOPN Inject 0.5 mg into the skin once a week.     sodium bicarbonate 650 MG tablet Take 650 mg by mouth daily.     tamsulosin (FLOMAX) 0.4 MG CAPS capsule Take 2 capsules (0.8 mg total) by mouth at bedtime.     No current facility-administered medications on file prior to visit.    Review of Systems:  As per HPI- otherwise negative.   Physical Examination: Vitals:   08/27/24 1404  BP: 115/68  Pulse: 66   Vitals:   08/27/24 1404  Weight: 205 lb 9.6 oz (93.3 kg)  Height: 6' 3 (1.905 m)   Body mass index is 25.7 kg/m. Ideal Body Weight: Weight in (lb) to have BMI = 25: 199.6  GEN: no acute distress. Normal weight, looks well  HEENT: Atraumatic, Normocephalic.  Ears and Nose: No external deformity. CV: RRR, No M/G/R. No JVD. No thrill. No extra  heart sounds. PULM:  CTA B, no wheezes, crackles, rhonchi. No retractions. No resp. distress. No accessory muscle use. ABD: S, NT, ND, +BS. No rebound. No HSM. EXTR: No c/c/e PSYCH: Normally interactive. Conversant.  Foot exam today, normal  Assessment and Plan: Chronic systolic (congestive) heart failure (HCC)  Primary hypertension  Diabetes mellitus due to underlying condition with other diabetic kidney complication (HCC) - Plan: Hemoglobin A1c  Proteinuria due to type 2 diabetes mellitus (HCC) - Plan: Microalbumin / creatinine urine ratio  Chronic kidney disease with active medical management without dialysis, unspecified CKD stage  Pure hypercholesterolemia  Multiple myeloma in relapse (HCC)  Pacemaker  Need for influenza vaccination - Plan: Flu vaccine HIGH DOSE PF(Fluzone  Trivalent)  Assessment & Plan Type 2 diabetes mellitus with diabetic kidney complication A1c was elevated in March. Current A1c and urine protein will be assessed to monitor kidney complications. - Check A1c today. - Perform urine protein test.  Multiple myeloma in relapse Currently on Revlimid  with regimen change to 14/14 days. Experiencing fatigue and feeling of being poisoned but he is hanging in there. Joint pain decreased. Immune system intact. Considering flu shot timing with treatment. - Continue Revlimid  regimen as adjusted. - Provide prescription for COVID vaccine. - Administer flu shot today.  Constipation Constipation likely due to multiple medications. Using Miralax  for management. GERD symptoms worsened, possibly related to constipation management. - Continue Miralax  as needed, titrate frequency based on symptoms.  Gastroesophageal reflux disease (GERD) GERD symptoms worsened, possibly related to constipation management and inability to use Pepto due to constipation.  Peripheral neuropathy of the feet Experiencing tingling in feet, indicating neuropathy.  He continues to use entresto  and monitors his  weight to keep an eye on his CHF Signed Harlene Schroeder, MD  9/11- received labs, message to pt  Results for orders placed or performed in visit on 08/27/24  Hemoglobin A1c   Collection Time: 08/27/24  2:38 PM  Result Value Ref Range   Hgb A1c MFr Bld 8.5 (H) 4.6 - 6.5 %  Microalbumin / creatinine urine ratio   Collection Time: 08/27/24  2:38 PM  Result Value Ref Range   Microalb, Ur 4.6 (H) 0.0 - 1.9 mg/dL   Creatinine,U 73.8 mg/dL   Microalb Creat Ratio 175.3 (H) 0.0 - 30.0 mg/g

## 2024-08-27 ENCOUNTER — Ambulatory Visit (INDEPENDENT_AMBULATORY_CARE_PROVIDER_SITE_OTHER): Admitting: Family Medicine

## 2024-08-27 ENCOUNTER — Encounter: Payer: Self-pay | Admitting: Family Medicine

## 2024-08-27 VITALS — BP 115/68 | HR 66 | Ht 75.0 in | Wt 205.6 lb

## 2024-08-27 DIAGNOSIS — E1129 Type 2 diabetes mellitus with other diabetic kidney complication: Secondary | ICD-10-CM

## 2024-08-27 DIAGNOSIS — I10 Essential (primary) hypertension: Secondary | ICD-10-CM

## 2024-08-27 DIAGNOSIS — N189 Chronic kidney disease, unspecified: Secondary | ICD-10-CM | POA: Diagnosis not present

## 2024-08-27 DIAGNOSIS — E7800 Pure hypercholesterolemia, unspecified: Secondary | ICD-10-CM

## 2024-08-27 DIAGNOSIS — Z950 Presence of cardiac pacemaker: Secondary | ICD-10-CM

## 2024-08-27 DIAGNOSIS — Z23 Encounter for immunization: Secondary | ICD-10-CM | POA: Diagnosis not present

## 2024-08-27 DIAGNOSIS — E1121 Type 2 diabetes mellitus with diabetic nephropathy: Secondary | ICD-10-CM

## 2024-08-27 DIAGNOSIS — C9002 Multiple myeloma in relapse: Secondary | ICD-10-CM

## 2024-08-27 DIAGNOSIS — E0829 Diabetes mellitus due to underlying condition with other diabetic kidney complication: Secondary | ICD-10-CM

## 2024-08-27 DIAGNOSIS — R809 Proteinuria, unspecified: Secondary | ICD-10-CM

## 2024-08-27 DIAGNOSIS — I5022 Chronic systolic (congestive) heart failure: Secondary | ICD-10-CM

## 2024-08-27 MED ORDER — ACCU-CHEK GUIDE ME W/DEVICE KIT: PACK | 0 refills | Status: AC

## 2024-08-28 ENCOUNTER — Encounter: Payer: Self-pay | Admitting: Family Medicine

## 2024-08-28 ENCOUNTER — Other Ambulatory Visit: Payer: Self-pay

## 2024-08-28 LAB — MICROALBUMIN / CREATININE URINE RATIO
Creatinine,U: 26.1 mg/dL
Microalb Creat Ratio: 175.3 mg/g — ABNORMAL HIGH (ref 0.0–30.0)
Microalb, Ur: 4.6 mg/dL — ABNORMAL HIGH (ref 0.0–1.9)

## 2024-08-28 LAB — HEMOGLOBIN A1C: Hgb A1c MFr Bld: 8.5 % — ABNORMAL HIGH (ref 4.6–6.5)

## 2024-08-29 DIAGNOSIS — H34811 Central retinal vein occlusion, right eye, with macular edema: Secondary | ICD-10-CM | POA: Diagnosis not present

## 2024-09-02 ENCOUNTER — Inpatient Hospital Stay

## 2024-09-02 VITALS — BP 122/58 | HR 52 | Temp 98.8°F | Resp 18

## 2024-09-02 DIAGNOSIS — Z5112 Encounter for antineoplastic immunotherapy: Secondary | ICD-10-CM | POA: Diagnosis not present

## 2024-09-02 DIAGNOSIS — R5383 Other fatigue: Secondary | ICD-10-CM | POA: Diagnosis not present

## 2024-09-02 DIAGNOSIS — C9002 Multiple myeloma in relapse: Secondary | ICD-10-CM

## 2024-09-02 DIAGNOSIS — K5900 Constipation, unspecified: Secondary | ICD-10-CM | POA: Diagnosis not present

## 2024-09-02 DIAGNOSIS — Z79899 Other long term (current) drug therapy: Secondary | ICD-10-CM | POA: Diagnosis not present

## 2024-09-02 DIAGNOSIS — D509 Iron deficiency anemia, unspecified: Secondary | ICD-10-CM | POA: Diagnosis not present

## 2024-09-02 LAB — CMP (CANCER CENTER ONLY)
ALT: 51 U/L — ABNORMAL HIGH (ref 0–44)
AST: 27 U/L (ref 15–41)
Albumin: 3.8 g/dL (ref 3.5–5.0)
Alkaline Phosphatase: 77 U/L (ref 38–126)
Anion gap: 10 (ref 5–15)
BUN: 24 mg/dL — ABNORMAL HIGH (ref 8–23)
CO2: 21 mmol/L — ABNORMAL LOW (ref 22–32)
Calcium: 8.9 mg/dL (ref 8.9–10.3)
Chloride: 103 mmol/L (ref 98–111)
Creatinine: 1.62 mg/dL — ABNORMAL HIGH (ref 0.61–1.24)
GFR, Estimated: 43 mL/min — ABNORMAL LOW (ref >60)
Glucose, Bld: 160 mg/dL — ABNORMAL HIGH (ref 70–99)
Potassium: 4.8 mmol/L (ref 3.5–5.1)
Sodium: 134 mmol/L — ABNORMAL LOW (ref 135–145)
Total Bilirubin: 0.3 mg/dL (ref 0.0–1.2)
Total Protein: 6.5 g/dL (ref 6.5–8.1)

## 2024-09-02 LAB — CBC WITH DIFFERENTIAL (CANCER CENTER ONLY)
Abs Immature Granulocytes: 0.02 K/uL (ref 0.00–0.07)
Basophils Absolute: 0.1 K/uL (ref 0.0–0.1)
Basophils Relative: 2 %
Eosinophils Absolute: 0.3 K/uL (ref 0.0–0.5)
Eosinophils Relative: 5 %
HCT: 35.5 % — ABNORMAL LOW (ref 39.0–52.0)
Hemoglobin: 11.9 g/dL — ABNORMAL LOW (ref 13.0–17.0)
Immature Granulocytes: 0 %
Lymphocytes Relative: 26 %
Lymphs Abs: 1.4 K/uL (ref 0.7–4.0)
MCH: 30.1 pg (ref 26.0–34.0)
MCHC: 33.5 g/dL (ref 30.0–36.0)
MCV: 89.9 fL (ref 80.0–100.0)
Monocytes Absolute: 0.9 K/uL (ref 0.1–1.0)
Monocytes Relative: 16 %
Neutro Abs: 2.7 K/uL (ref 1.7–7.7)
Neutrophils Relative %: 51 %
Platelet Count: 212 K/uL (ref 150–400)
RBC: 3.95 MIL/uL — ABNORMAL LOW (ref 4.22–5.81)
RDW: 14.5 % (ref 11.5–15.5)
WBC Count: 5.2 K/uL (ref 4.0–10.5)
nRBC: 0 % (ref 0.0–0.2)

## 2024-09-02 MED ORDER — ACETAMINOPHEN 325 MG PO TABS: 650.0000 mg | ORAL_TABLET | Freq: Once | ORAL | Status: DC

## 2024-09-02 MED ORDER — DARATUMUMAB-HYALURONIDASE-FIHJ 1800-30000 MG-UT/15ML ~~LOC~~ SOLN
1800.0000 mg | Freq: Once | SUBCUTANEOUS | Status: AC
  Administered 2024-09-02: 1800 mg via SUBCUTANEOUS
  Filled 2024-09-02: qty 15

## 2024-09-02 MED ORDER — DEXAMETHASONE 4 MG PO TABS: 20.0000 mg | ORAL_TABLET | Freq: Once | ORAL | Status: DC

## 2024-09-02 MED ORDER — DIPHENHYDRAMINE HCL 25 MG PO CAPS: 50.0000 mg | ORAL_CAPSULE | Freq: Once | ORAL | Status: DC

## 2024-09-02 MED ORDER — BORTEZOMIB CHEMO SQ INJECTION 3.5 MG (2.5MG/ML)
1.3000 mg/m2 | Freq: Once | INTRAMUSCULAR | Status: DC
  Filled 2024-09-02: qty 1.1

## 2024-09-02 NOTE — Progress Notes (Signed)
 OK to treat with creat-1.62 per order of Dr. Timmy.

## 2024-09-09 ENCOUNTER — Inpatient Hospital Stay: Admitting: Hematology & Oncology

## 2024-09-09 ENCOUNTER — Encounter: Payer: Self-pay | Admitting: Hematology & Oncology

## 2024-09-09 ENCOUNTER — Inpatient Hospital Stay

## 2024-09-09 VITALS — BP 117/66 | HR 50 | Temp 98.6°F | Resp 18 | Ht 75.0 in | Wt 202.8 lb

## 2024-09-09 DIAGNOSIS — Z5112 Encounter for antineoplastic immunotherapy: Secondary | ICD-10-CM | POA: Diagnosis not present

## 2024-09-09 DIAGNOSIS — C9002 Multiple myeloma in relapse: Secondary | ICD-10-CM

## 2024-09-09 DIAGNOSIS — K5900 Constipation, unspecified: Secondary | ICD-10-CM | POA: Diagnosis not present

## 2024-09-09 DIAGNOSIS — D509 Iron deficiency anemia, unspecified: Secondary | ICD-10-CM | POA: Diagnosis not present

## 2024-09-09 DIAGNOSIS — Z79899 Other long term (current) drug therapy: Secondary | ICD-10-CM | POA: Diagnosis not present

## 2024-09-09 DIAGNOSIS — R5383 Other fatigue: Secondary | ICD-10-CM | POA: Diagnosis not present

## 2024-09-09 LAB — CMP (CANCER CENTER ONLY)
ALT: 62 U/L — ABNORMAL HIGH (ref 0–44)
AST: 36 U/L (ref 15–41)
Albumin: 4 g/dL (ref 3.5–5.0)
Alkaline Phosphatase: 86 U/L (ref 38–126)
Anion gap: 12 (ref 5–15)
BUN: 26 mg/dL — ABNORMAL HIGH (ref 8–23)
CO2: 22 mmol/L (ref 22–32)
Calcium: 9.2 mg/dL (ref 8.9–10.3)
Chloride: 103 mmol/L (ref 98–111)
Creatinine: 1.78 mg/dL — ABNORMAL HIGH (ref 0.61–1.24)
GFR, Estimated: 39 mL/min — ABNORMAL LOW (ref >60)
Glucose, Bld: 168 mg/dL — ABNORMAL HIGH (ref 70–99)
Potassium: 4.5 mmol/L (ref 3.5–5.1)
Sodium: 137 mmol/L (ref 135–145)
Total Bilirubin: 0.4 mg/dL (ref 0.0–1.2)
Total Protein: 6.9 g/dL (ref 6.5–8.1)

## 2024-09-09 LAB — CBC WITH DIFFERENTIAL (CANCER CENTER ONLY)
Abs Immature Granulocytes: 0.02 K/uL (ref 0.00–0.07)
Basophils Absolute: 0.1 K/uL (ref 0.0–0.1)
Basophils Relative: 1 %
Eosinophils Absolute: 0.6 K/uL — ABNORMAL HIGH (ref 0.0–0.5)
Eosinophils Relative: 9 %
HCT: 38.1 % — ABNORMAL LOW (ref 39.0–52.0)
Hemoglobin: 12.8 g/dL — ABNORMAL LOW (ref 13.0–17.0)
Immature Granulocytes: 0 %
Lymphocytes Relative: 14 %
Lymphs Abs: 0.9 K/uL (ref 0.7–4.0)
MCH: 30.3 pg (ref 26.0–34.0)
MCHC: 33.6 g/dL (ref 30.0–36.0)
MCV: 90.1 fL (ref 80.0–100.0)
Monocytes Absolute: 0.6 K/uL (ref 0.1–1.0)
Monocytes Relative: 9 %
Neutro Abs: 4.1 K/uL (ref 1.7–7.7)
Neutrophils Relative %: 67 %
Platelet Count: 178 K/uL (ref 150–400)
RBC: 4.23 MIL/uL (ref 4.22–5.81)
RDW: 14.7 % (ref 11.5–15.5)
WBC Count: 6.2 K/uL (ref 4.0–10.5)
nRBC: 0 % (ref 0.0–0.2)

## 2024-09-09 LAB — LACTATE DEHYDROGENASE: LDH: 341 U/L — ABNORMAL HIGH (ref 98–192)

## 2024-09-09 MED ORDER — DARATUMUMAB-HYALURONIDASE-FIHJ 1800-30000 MG-UT/15ML ~~LOC~~ SOLN
1800.0000 mg | Freq: Once | SUBCUTANEOUS | Status: AC
  Administered 2024-09-09: 1800 mg via SUBCUTANEOUS
  Filled 2024-09-09: qty 15

## 2024-09-09 MED ORDER — ACETAMINOPHEN 325 MG PO TABS: 650.0000 mg | ORAL_TABLET | Freq: Once | ORAL | Status: DC

## 2024-09-09 MED ORDER — DEXAMETHASONE 4 MG PO TABS: 20.0000 mg | ORAL_TABLET | Freq: Once | ORAL | Status: DC

## 2024-09-09 MED ORDER — BORTEZOMIB CHEMO SQ INJECTION 3.5 MG (2.5MG/ML)
1.3000 mg/m2 | Freq: Once | INTRAMUSCULAR | Status: AC
  Administered 2024-09-09: 2.75 mg via SUBCUTANEOUS
  Filled 2024-09-09: qty 1.1

## 2024-09-09 MED ORDER — DIPHENHYDRAMINE HCL 25 MG PO CAPS: 50.0000 mg | ORAL_CAPSULE | Freq: Once | ORAL | Status: DC

## 2024-09-09 NOTE — Patient Instructions (Signed)
 [facility] - A DEPT OF [facility]  Discharge Instructions: Thank you for choosing [facility] to provide your oncology and hematology care.   If you have a lab appointment with the Cancer Center, please go directly to the Cancer Center and check in at the registration area.  Wear comfortable clothing and clothing appropriate for easy access to any Portacath or PICC line.   We strive to give you quality time with your provider. You may need to reschedule your appointment if you arrive late (15 or more minutes).  Arriving late affects you and other patients whose appointments are after yours.  Also, if you miss three or more appointments without notifying the office, you may be dismissed from the clinic at the provider's discretion.      For prescription refill requests, have your pharmacy contact our office and allow 72 hours for refills to be completed.    Today you received the following chemotherapy and/or immunotherapy agents Darzalex , Velcade .      To help prevent nausea and vomiting after your treatment, we encourage you to take your nausea medication as directed.  BELOW ARE SYMPTOMS THAT SHOULD BE REPORTED IMMEDIATELY: *FEVER GREATER THAN 100.4 F (38 C) OR HIGHER *CHILLS OR SWEATING *NAUSEA AND VOMITING THAT IS NOT CONTROLLED WITH YOUR NAUSEA MEDICATION *UNUSUAL SHORTNESS OF BREATH *UNUSUAL BRUISING OR BLEEDING *URINARY PROBLEMS (pain or burning when urinating, or frequent urination) *BOWEL PROBLEMS (unusual diarrhea, constipation, pain near the anus) TENDERNESS IN MOUTH AND THROAT WITH OR WITHOUT PRESENCE OF ULCERS (sore throat, sores in mouth, or a toothache) UNUSUAL RASH, SWELLING OR PAIN  UNUSUAL VAGINAL DISCHARGE OR ITCHING   Items with * indicate a potential emergency and should be followed up as soon as possible or go to the Emergency Department if any problems should occur.  Please show the CHEMOTHERAPY ALERT CARD or  IMMUNOTHERAPY ALERT CARD at check-in to the Emergency Department and triage nurse. Should you have questions after your visit or need to cancel or reschedule your appointment, please contact [facility] - A DEPT OF [facility]  [phone] and follow the prompts.  Office hours are 8:00 a.m. to 4:30 p.m. Monday - Friday. Please note that voicemails left after 4:00 p.m. may not be returned until the following business day.  We are closed weekends and major holidays. You have access to a nurse at all times for urgent questions. Please call the main number to the clinic [phone] and follow the prompts.  For any non-urgent questions, you may also contact your provider using MyChart. We now offer e-Visits for anyone 72 and older to request care online for non-urgent symptoms. For details visit [email or web address].   Also download the MyChart app! Go to the app store, search MyChart, open the app, select [facility], and log in with your MyChart username and password.

## 2024-09-09 NOTE — Progress Notes (Signed)
OK to treat with creat-1.78 per order of Dr. Marin Olp.

## 2024-09-09 NOTE — Progress Notes (Signed)
 Hematology and Oncology Follow Up Visit  Rico Massar Sr. [id number] [birth year] 79 y.o. 09/09/2024   Principle Diagnosis:  IgG kappa smoldering myeloma - progressive Iron  deficiency anemia -- Venofer  given on 03/2020  Current Therapy:   Faspro/Velcade /Revlimid /Dex -- s/p cycle #2 - start - on 07/29/2024     Interim History:  Mr.  Kroon is back for treatment.  So far, he is doing quite nicely.  He really has had no complaints with treatment.  He still quite active.  His myeloma studies have improved quite nicely.  His monoclonal spike is now down to 0.9 g/dL.  His IgG level is down to 1862 mg/dL.  The Kappa light chain is down to 4.4 mg/dL.  He has had little bit of constipation.  However, he is still going.  He has had no rashes.  He has had no bleeding.  He has had no fever.  He has had no cough.  He has had no tingling in the hands or feet.  He has had no rashes.  Overall, I would say that his performance status is probably ECOG 0.   Medications:  Current Outpatient Medications:    Accu-Chek Softclix Lancets lancets, Use to check blood sugar once daily Dx code: E11.9, Disp: 100 each, Rfl: 1   acetaminophen  (TYLENOL ) 500 MG tablet, Take 1,000 mg by mouth., Disp: , Rfl:    albuterol  (VENTOLIN  HFA) 108 (90 Base) MCG/ACT inhaler, Inhale 1-2 puffs into the lungs every 6 (six) hours as needed for wheezing or shortness of breath., Disp: 8.5 each, Rfl: 6   aspirin  325 MG tablet, Take 1 tablet (325 mg total) by mouth daily., Disp: 100 tablet, Rfl: 3   atorvastatin  (LIPITOR) 20 MG tablet, Take 1 tablet (20 mg total) by mouth daily., Disp: 90 tablet, Rfl: 3   Blood Glucose Monitoring Suppl (ACCU-CHEK GUIDE ME) w/Device KIT, Check blood glucose once daily, Disp: 1 kit, Rfl: 0   Blood Glucose Monitoring Suppl (ACCU-CHEK GUIDE) w/Device KIT, Use to check blood sugar once daily Dx code: E11.9, Disp: 1 kit, Rfl: 0   dexamethasone  (DECADRON ) 4 MG tablet, Take 5 tabs (20 mg) weekly the day after  daratumumab  for 12 weeks. Take with breakfast., Disp: 20 tablet, Rfl: 5   DHA-EPA-Vitamin E (OMEGA-3 COMPLEX) 192-251-11 MG-MG-UNIT CAPS, Take 1 capsule by mouth every other day., Disp: , Rfl:    famciclovir  (FAMVIR ) 250 MG tablet, Take 1 tablet (250 mg total) by mouth 2 (two) times daily. Please start this 3 days prior to starting chemotherapy in August., Disp: 30 tablet, Rfl: 12   ferrous sulfate  325 (65 FE) MG tablet, Take 325 mg by mouth every other day., Disp: , Rfl:    finasteride (PROSCAR) 5 MG tablet, Take 5 mg by mouth daily., Disp: , Rfl:    Ginkgo Biloba 40 MG TABS, Take 1 tablet by mouth daily. , Disp: , Rfl:    glucosamine-chondroitin 500-400 MG tablet, Take 1 tablet by mouth daily., Disp: , Rfl:    glucose blood (ACCU-CHEK GUIDE TEST) test strip, Use to check blood sugar once daily Dx code: E11.9, Disp: 100 each, Rfl: 1   JARDIANCE  25 MG TABS tablet, TAKE 1 TABLET BY MOUTH DAILY BEFORE BREAKFAST., Disp: 90 tablet, Rfl: 1   latanoprost  (XALATAN ) 0.005 % ophthalmic solution, Place 1 drop into both eyes at bedtime. , Disp: , Rfl:    lenalidomide  (REVLIMID ) 10 MG capsule, Take 10 mg by mouth daily. (Patient taking differently: Take 10 mg by mouth every 14 (  fourteen) days. Take one tablet by mouth for fourteen days, then off for fourteen days. Then repeat.), Disp: , Rfl:    montelukast  (SINGULAIR ) 10 MG tablet, Take 1 tablet 1 hour to Darzalex  (Faspro), Disp: 30 tablet, Rfl: 2   Multiple Vitamin (MULTIVITAMIN WITH MINERALS) TABS, Take 1 tablet by mouth daily. , Disp: , Rfl:    ondansetron  (ZOFRAN ) 8 MG tablet, Take 1 tablet (8 mg total) by mouth every 8 (eight) hours as needed for nausea or vomiting., Disp: 30 tablet, Rfl: 1   prochlorperazine  (COMPAZINE ) 10 MG tablet, Take 1 tablet (10 mg total) by mouth every 6 (six) hours as needed for nausea or vomiting., Disp: 30 tablet, Rfl: 1   sacubitril -valsartan  (ENTRESTO ) 49-51 MG, Take 1 tablet by mouth 2 (two) times daily., Disp: 180 tablet, Rfl:  0   Semaglutide ,0.25 or 0.5MG /DOS, (OZEMPIC , 0.25 OR 0.5 MG/DOSE,) 2 MG/3ML SOPN, Inject 0.5 mg into the skin once a week., Disp: , Rfl:    sodium bicarbonate 650 MG tablet, Take 650 mg by mouth daily., Disp: , Rfl:    tamsulosin (FLOMAX) 0.4 MG CAPS capsule, Take 2 capsules (0.8 mg total) by mouth at bedtime., Disp: , Rfl:    carvedilol  (COREG ) 25 MG tablet, TAKE 1 AND 1/2 TABLETS (37.5 MG TOTAL) BY MOUTH 2 (TWO) TIMES DAILY WITH A MEAL. (Patient not taking: Reported on 09/09/2024), Disp: 270 tablet, Rfl: 2  Allergies:  Allergies  Allergen Reactions   Atorvastatin  Other (See Comments)     hard to focus   Rosuvastatin Other (See Comments)    unable to focus   Amlodipine  Swelling    Past Medical History, Surgical history, Social history, and Family History were reviewed and updated.  Review of Systems: Review of Systems  Constitutional: Negative.   HENT: Negative.    Eyes: Negative.   Respiratory: Negative.    Cardiovascular: Negative.   Gastrointestinal: Negative.   Genitourinary: Negative.   Musculoskeletal: Negative.   Skin: Negative.   Neurological: Negative.   Endo/Heme/Allergies: Negative.   Psychiatric/Behavioral: Negative.       Physical Exam: Vital signs are temperature of 98.6.  Pulse 50.  Blood pressure 117/66.  Weight is 202 pounds.     Physical Exam Vitals reviewed.  HENT:     Head: Normocephalic and atraumatic.  Eyes:     Pupils: Pupils are equal, round, and reactive to light.  Cardiovascular:     Rate and Rhythm: Normal rate and regular rhythm.     Heart sounds: Normal heart sounds.  Pulmonary:     Effort: Pulmonary effort is normal.     Breath sounds: Normal breath sounds.  Abdominal:     General: Bowel sounds are normal.     Palpations: Abdomen is soft.  Musculoskeletal:        General: No tenderness or deformity. Normal range of motion.     Cervical back: Normal range of motion.  Lymphadenopathy:     Cervical: No cervical adenopathy.  Skin:     General: Skin is warm and dry.     Findings: No erythema or rash.  Neurological:     Mental Status: He is alert and oriented to person, place, and time.  Psychiatric:        Behavior: Behavior normal.        Thought Content: Thought content normal.        Judgment: Judgment normal.      Lab Results  Component Value Date   WBC 6.2 09/09/2024  HGB 12.8 (L) 09/09/2024   HCT 38.1 (L) 09/09/2024   MCV 90.1 09/09/2024   PLT 178 09/09/2024     Chemistry      Component Value Date/Time   NA 137 09/09/2024 0945   NA 133 (L) 01/12/2020 1015   NA 142 10/03/2017 0910   NA 137 04/03/2017 0852   K 4.5 09/09/2024 0945   K 3.9 10/03/2017 0910   K 4.2 04/03/2017 0852   CL 103 09/09/2024 0945   CL 103 10/03/2017 0910   CL 108 (H) 05/15/2013 0912   CO2 22 09/09/2024 0945   CO2 28 10/03/2017 0910   CO2 26 04/03/2017 0852   BUN 26 (H) 09/09/2024 0945   BUN 25 01/12/2020 1015   BUN 13 10/03/2017 0910   BUN 16.3 04/03/2017 0852   CREATININE 1.78 (H) 09/09/2024 0945   CREATININE 1.07 11/16/2017 1437   CREATININE 1.0 04/03/2017 0852      Component Value Date/Time   CALCIUM  9.2 09/09/2024 0945   CALCIUM  9.3 10/03/2017 0910   CALCIUM  9.0 04/03/2017 0852   ALKPHOS 86 09/09/2024 0945   ALKPHOS 51 10/03/2017 0910   ALKPHOS 46 04/03/2017 0852   AST 36 09/09/2024 0945   AST 18 04/03/2017 0852   ALT 62 (H) 09/09/2024 0945   ALT 34 10/03/2017 0910   ALT 19 04/03/2017 0852   BILITOT 0.4 09/09/2024 0945   BILITOT 0.38 04/03/2017 0852     Impression and Plan: Mr. Miyasato is 78 year old gentleman with IgG kappa smoldering myeloma.  I have been watching him for several years.    We finally have him on treatment.  Again he is doing incredibly well.  We adjusted the Revlimid  dosing.  This is much better for him.  We have his blood counts look great.  His blood sugar is up a little bit, which I am sure is probably from the dexamethasone .  I told him to make sure he stays well hydrated.  Will  go ahead and plan to start his third cycle.  I will see him back in 3 weeks when he starts his fourth cycle.    Maude JONELLE Crease, MD 9/23/202510:51 AM

## 2024-09-10 ENCOUNTER — Ambulatory Visit: Payer: Self-pay | Admitting: Hematology & Oncology

## 2024-09-10 LAB — KAPPA/LAMBDA LIGHT CHAINS
Kappa free light chain: 32.2 mg/L — ABNORMAL HIGH (ref 3.3–19.4)
Kappa, lambda light chain ratio: 1.74 — ABNORMAL HIGH (ref 0.26–1.65)
Lambda free light chains: 18.5 mg/L (ref 5.7–26.3)

## 2024-09-10 LAB — IGG, IGA, IGM
IgA: 132 mg/dL (ref 61–437)
IgG (Immunoglobin G), Serum: 1203 mg/dL (ref 603–1613)
IgM (Immunoglobulin M), Srm: 21 mg/dL (ref 15–143)

## 2024-09-16 ENCOUNTER — Inpatient Hospital Stay

## 2024-09-16 VITALS — BP 122/76 | HR 84 | Temp 97.7°F | Resp 18

## 2024-09-16 DIAGNOSIS — R5383 Other fatigue: Secondary | ICD-10-CM | POA: Diagnosis not present

## 2024-09-16 DIAGNOSIS — D509 Iron deficiency anemia, unspecified: Secondary | ICD-10-CM | POA: Diagnosis not present

## 2024-09-16 DIAGNOSIS — Z5112 Encounter for antineoplastic immunotherapy: Secondary | ICD-10-CM | POA: Diagnosis not present

## 2024-09-16 DIAGNOSIS — C9002 Multiple myeloma in relapse: Secondary | ICD-10-CM

## 2024-09-16 DIAGNOSIS — Z79899 Other long term (current) drug therapy: Secondary | ICD-10-CM | POA: Diagnosis not present

## 2024-09-16 DIAGNOSIS — K5900 Constipation, unspecified: Secondary | ICD-10-CM | POA: Diagnosis not present

## 2024-09-16 LAB — CBC WITH DIFFERENTIAL (CANCER CENTER ONLY)
Abs Immature Granulocytes: 0.01 K/uL (ref 0.00–0.07)
Basophils Absolute: 0.1 K/uL (ref 0.0–0.1)
Basophils Relative: 1 %
Eosinophils Absolute: 0.8 K/uL — ABNORMAL HIGH (ref 0.0–0.5)
Eosinophils Relative: 11 %
HCT: 37.4 % — ABNORMAL LOW (ref 39.0–52.0)
Hemoglobin: 12.5 g/dL — ABNORMAL LOW (ref 13.0–17.0)
Immature Granulocytes: 0 %
Lymphocytes Relative: 13 %
Lymphs Abs: 1 K/uL (ref 0.7–4.0)
MCH: 30.1 pg (ref 26.0–34.0)
MCHC: 33.4 g/dL (ref 30.0–36.0)
MCV: 90.1 fL (ref 80.0–100.0)
Monocytes Absolute: 1 K/uL (ref 0.1–1.0)
Monocytes Relative: 13 %
Neutro Abs: 4.5 K/uL (ref 1.7–7.7)
Neutrophils Relative %: 62 %
Platelet Count: 161 K/uL (ref 150–400)
RBC: 4.15 MIL/uL — ABNORMAL LOW (ref 4.22–5.81)
RDW: 14.4 % (ref 11.5–15.5)
WBC Count: 7.3 K/uL (ref 4.0–10.5)
nRBC: 0 % (ref 0.0–0.2)

## 2024-09-16 LAB — PROTEIN ELECTROPHORESIS, SERUM, WITH REFLEX
A/G Ratio: 1.2 (ref 0.7–1.7)
Albumin ELP: 3.4 g/dL (ref 2.9–4.4)
Alpha-1-Globulin: 0.2 g/dL (ref 0.0–0.4)
Alpha-2-Globulin: 0.8 g/dL (ref 0.4–1.0)
Beta Globulin: 0.9 g/dL (ref 0.7–1.3)
Gamma Globulin: 1 g/dL (ref 0.4–1.8)
Globulin, Total: 2.9 g/dL (ref 2.2–3.9)
M-Spike, %: 0.4 g/dL — ABNORMAL HIGH
SPEP Interpretation: 0
Total Protein ELP: 6.3 g/dL (ref 6.0–8.5)

## 2024-09-16 LAB — CMP (CANCER CENTER ONLY)
ALT: 37 U/L (ref 0–44)
AST: 23 U/L (ref 15–41)
Albumin: 4 g/dL (ref 3.5–5.0)
Alkaline Phosphatase: 80 U/L (ref 38–126)
Anion gap: 11 (ref 5–15)
BUN: 27 mg/dL — ABNORMAL HIGH (ref 8–23)
CO2: 22 mmol/L (ref 22–32)
Calcium: 8.9 mg/dL (ref 8.9–10.3)
Chloride: 102 mmol/L (ref 98–111)
Creatinine: 1.6 mg/dL — ABNORMAL HIGH (ref 0.61–1.24)
GFR, Estimated: 44 mL/min — ABNORMAL LOW (ref >60)
Glucose, Bld: 229 mg/dL — ABNORMAL HIGH (ref 70–99)
Potassium: 4.7 mmol/L (ref 3.5–5.1)
Sodium: 135 mmol/L (ref 135–145)
Total Bilirubin: 0.3 mg/dL (ref 0.0–1.2)
Total Protein: 6.5 g/dL (ref 6.5–8.1)

## 2024-09-16 LAB — IMMUNOFIXATION REFLEX, SERUM
IgA: 185 mg/dL (ref 61–437)
IgG (Immunoglobin G), Serum: 1655 mg/dL — ABNORMAL HIGH (ref 603–1613)
IgM (Immunoglobulin M), Srm: 29 mg/dL (ref 15–143)

## 2024-09-16 MED ORDER — DARATUMUMAB-HYALURONIDASE-FIHJ 1800-30000 MG-UT/15ML ~~LOC~~ SOLN
1800.0000 mg | Freq: Once | SUBCUTANEOUS | Status: AC
  Administered 2024-09-16: 1800 mg via SUBCUTANEOUS
  Filled 2024-09-16: qty 15

## 2024-09-16 MED ORDER — BORTEZOMIB CHEMO SQ INJECTION 3.5 MG (2.5MG/ML)
1.3000 mg/m2 | Freq: Once | INTRAMUSCULAR | Status: AC
  Administered 2024-09-16: 2.75 mg via SUBCUTANEOUS
  Filled 2024-09-16: qty 1.1

## 2024-09-16 MED ORDER — ACETAMINOPHEN 325 MG PO TABS: 650.0000 mg | ORAL_TABLET | Freq: Once | ORAL | Status: DC

## 2024-09-16 MED ORDER — DIPHENHYDRAMINE HCL 25 MG PO CAPS: 50.0000 mg | ORAL_CAPSULE | Freq: Once | ORAL | Status: DC

## 2024-09-16 MED ORDER — DEXAMETHASONE 4 MG PO TABS: 20.0000 mg | ORAL_TABLET | Freq: Once | ORAL | Status: DC

## 2024-09-16 NOTE — Progress Notes (Signed)
Ok to treat with Creatinine 1.6 per Dr. Myna Hidalgo

## 2024-09-16 NOTE — Patient Instructions (Signed)
 [facility]  Discharge Instructions: Thank you for choosing [facility] to provide your oncology and hematology care.   If you have a lab appointment with the Cancer Center, please go directly to the Cancer Center and check in at the registration area.  Wear comfortable clothing and clothing appropriate for easy access to any Portacath or PICC line.   We strive to give you quality time with your provider. You may need to reschedule your appointment if you arrive late (15 or more minutes).  Arriving late affects you and other patients whose appointments are after yours.  Also, if you miss three or more appointments without notifying the office, you may be dismissed from the clinic at the provider's discretion.      For prescription refill requests, have your pharmacy contact our office and allow 72 hours for refills to be completed.    Today you received the following chemotherapy and/or immunotherapy agents Velcade/Darzalex      To help prevent nausea and vomiting after your treatment, we encourage you to take your nausea medication as directed.  BELOW ARE SYMPTOMS THAT SHOULD BE REPORTED IMMEDIATELY: *FEVER GREATER THAN 100.4 F (38 C) OR HIGHER *CHILLS OR SWEATING *NAUSEA AND VOMITING THAT IS NOT CONTROLLED WITH YOUR NAUSEA MEDICATION *UNUSUAL SHORTNESS OF BREATH *UNUSUAL BRUISING OR BLEEDING *URINARY PROBLEMS (pain or burning when urinating, or frequent urination) *BOWEL PROBLEMS (unusual diarrhea, constipation, pain near the anus) TENDERNESS IN MOUTH AND THROAT WITH OR WITHOUT PRESENCE OF ULCERS (sore throat, sores in mouth, or a toothache) UNUSUAL RASH, SWELLING OR PAIN  UNUSUAL VAGINAL DISCHARGE OR ITCHING   Items with * indicate a potential emergency and should be followed up as soon as possible or go to the Emergency Department if any problems should occur.  Please show the CHEMOTHERAPY ALERT CARD or  IMMUNOTHERAPY ALERT CARD at check-in to the Emergency Department and triage nurse. Should you have questions after your visit or need to cancel or reschedule your appointment, please contact [facility] - A DEPT OF Eligha Bridegroom [facility]  [phone] and follow the prompts.  Office hours are 8:00 a.m. to 4:30 p.m. Monday - Friday. Please note that voicemails left after 4:00 p.m. may not be returned until the following business day.  We are closed weekends and major holidays. You have access to a nurse at all times for urgent questions. Please call the main number to the clinic [phone] and follow the prompts.  For any non-urgent questions, you may also contact your provider using MyChart. We now offer e-Visits for anyone 71 and older to request care online for non-urgent symptoms. For details visit [email or web address].   Also download the MyChart app! Go to the app store, search "MyChart", open the app, select , and log in with your MyChart username and password.

## 2024-09-23 ENCOUNTER — Other Ambulatory Visit: Payer: Self-pay

## 2024-09-23 ENCOUNTER — Inpatient Hospital Stay: Attending: Hematology & Oncology

## 2024-09-23 ENCOUNTER — Inpatient Hospital Stay

## 2024-09-23 ENCOUNTER — Inpatient Hospital Stay: Admitting: Hematology & Oncology

## 2024-09-23 VITALS — BP 126/75 | HR 74 | Temp 97.7°F | Resp 18

## 2024-09-23 DIAGNOSIS — C9002 Multiple myeloma in relapse: Secondary | ICD-10-CM

## 2024-09-23 DIAGNOSIS — D509 Iron deficiency anemia, unspecified: Secondary | ICD-10-CM | POA: Diagnosis not present

## 2024-09-23 DIAGNOSIS — Z5112 Encounter for antineoplastic immunotherapy: Secondary | ICD-10-CM | POA: Diagnosis present

## 2024-09-23 DIAGNOSIS — Z79899 Other long term (current) drug therapy: Secondary | ICD-10-CM | POA: Diagnosis not present

## 2024-09-23 DIAGNOSIS — R5383 Other fatigue: Secondary | ICD-10-CM | POA: Insufficient documentation

## 2024-09-23 DIAGNOSIS — K5900 Constipation, unspecified: Secondary | ICD-10-CM | POA: Insufficient documentation

## 2024-09-23 LAB — CBC WITH DIFFERENTIAL (CANCER CENTER ONLY)
Abs Immature Granulocytes: 0.02 K/uL (ref 0.00–0.07)
Basophils Absolute: 0.1 K/uL (ref 0.0–0.1)
Basophils Relative: 1 %
Eosinophils Absolute: 0.5 K/uL (ref 0.0–0.5)
Eosinophils Relative: 8 %
HCT: 39.3 % (ref 39.0–52.0)
Hemoglobin: 13.1 g/dL (ref 13.0–17.0)
Immature Granulocytes: 0 %
Lymphocytes Relative: 22 %
Lymphs Abs: 1.2 K/uL (ref 0.7–4.0)
MCH: 30.3 pg (ref 26.0–34.0)
MCHC: 33.3 g/dL (ref 30.0–36.0)
MCV: 90.8 fL (ref 80.0–100.0)
Monocytes Absolute: 0.8 K/uL (ref 0.1–1.0)
Monocytes Relative: 13 %
Neutro Abs: 3.1 K/uL (ref 1.7–7.7)
Neutrophils Relative %: 56 %
Platelet Count: 174 K/uL (ref 150–400)
RBC: 4.33 MIL/uL (ref 4.22–5.81)
RDW: 14.2 % (ref 11.5–15.5)
WBC Count: 5.7 K/uL (ref 4.0–10.5)
nRBC: 0 % (ref 0.0–0.2)

## 2024-09-23 LAB — CMP (CANCER CENTER ONLY)
ALT: 37 U/L (ref 0–44)
AST: 24 U/L (ref 15–41)
Albumin: 4.1 g/dL (ref 3.5–5.0)
Alkaline Phosphatase: 80 U/L (ref 38–126)
Anion gap: 12 (ref 5–15)
BUN: 28 mg/dL — ABNORMAL HIGH (ref 8–23)
CO2: 21 mmol/L — ABNORMAL LOW (ref 22–32)
Calcium: 9.2 mg/dL (ref 8.9–10.3)
Chloride: 102 mmol/L (ref 98–111)
Creatinine: 1.61 mg/dL — ABNORMAL HIGH (ref 0.61–1.24)
GFR, Estimated: 44 mL/min — ABNORMAL LOW (ref >60)
Glucose, Bld: 184 mg/dL — ABNORMAL HIGH (ref 70–99)
Potassium: 4.9 mmol/L (ref 3.5–5.1)
Sodium: 134 mmol/L — ABNORMAL LOW (ref 135–145)
Total Bilirubin: 0.4 mg/dL (ref 0.0–1.2)
Total Protein: 6.8 g/dL (ref 6.5–8.1)

## 2024-09-23 MED ORDER — LENALIDOMIDE 10 MG PO CAPS: 10.0000 mg | ORAL_CAPSULE | Freq: Every day | ORAL | 0 refills | Status: AC

## 2024-09-23 MED ORDER — DARATUMUMAB-HYALURONIDASE-FIHJ 1800-30000 MG-UT/15ML ~~LOC~~ SOLN
1800.0000 mg | Freq: Once | SUBCUTANEOUS | Status: AC
  Administered 2024-09-23: 1800 mg via SUBCUTANEOUS
  Filled 2024-09-23: qty 15

## 2024-09-23 MED ORDER — ACETAMINOPHEN 325 MG PO TABS: 650.0000 mg | ORAL_TABLET | Freq: Once | ORAL | Status: DC

## 2024-09-23 MED ORDER — DEXAMETHASONE 4 MG PO TABS: 20.0000 mg | ORAL_TABLET | Freq: Once | ORAL | Status: DC

## 2024-09-23 MED ORDER — DIPHENHYDRAMINE HCL 25 MG PO CAPS: 50.0000 mg | ORAL_CAPSULE | Freq: Once | ORAL | Status: DC

## 2024-09-23 MED ORDER — BORTEZOMIB CHEMO SQ INJECTION 3.5 MG (2.5MG/ML)
1.3000 mg/m2 | Freq: Once | INTRAMUSCULAR | Status: AC
  Administered 2024-09-23: 2.75 mg via SUBCUTANEOUS
  Filled 2024-09-23: qty 1.1

## 2024-09-23 NOTE — Progress Notes (Signed)
 Remote PPM Transmission

## 2024-09-23 NOTE — Progress Notes (Signed)
Labs reviewed by MD, ok to treat despite counts. 

## 2024-09-25 ENCOUNTER — Encounter: Payer: Self-pay | Admitting: Hematology & Oncology

## 2024-09-26 ENCOUNTER — Encounter: Payer: Self-pay | Admitting: Pharmacist

## 2024-09-26 DIAGNOSIS — M19072 Primary osteoarthritis, left ankle and foot: Secondary | ICD-10-CM | POA: Diagnosis not present

## 2024-09-26 DIAGNOSIS — E1151 Type 2 diabetes mellitus with diabetic peripheral angiopathy without gangrene: Secondary | ICD-10-CM | POA: Diagnosis not present

## 2024-09-26 DIAGNOSIS — M19071 Primary osteoarthritis, right ankle and foot: Secondary | ICD-10-CM | POA: Diagnosis not present

## 2024-09-26 DIAGNOSIS — L602 Onychogryphosis: Secondary | ICD-10-CM | POA: Diagnosis not present

## 2024-09-26 DIAGNOSIS — L84 Corns and callosities: Secondary | ICD-10-CM | POA: Diagnosis not present

## 2024-09-29 ENCOUNTER — Other Ambulatory Visit: Payer: Self-pay | Admitting: Family Medicine

## 2024-09-29 DIAGNOSIS — E0829 Diabetes mellitus due to underlying condition with other diabetic kidney complication: Secondary | ICD-10-CM

## 2024-09-29 MED ORDER — ACCU-CHEK GUIDE TEST VI STRP: ORAL_STRIP | 12 refills | Status: AC

## 2024-09-30 ENCOUNTER — Inpatient Hospital Stay

## 2024-09-30 ENCOUNTER — Inpatient Hospital Stay: Admitting: Medical Oncology

## 2024-09-30 ENCOUNTER — Encounter: Payer: Self-pay | Admitting: Medical Oncology

## 2024-09-30 VITALS — BP 144/87 | HR 76 | Resp 18

## 2024-09-30 VITALS — BP 141/64 | HR 52 | Temp 97.8°F | Resp 18 | Ht 75.0 in | Wt 204.0 lb

## 2024-09-30 DIAGNOSIS — C9002 Multiple myeloma in relapse: Secondary | ICD-10-CM

## 2024-09-30 DIAGNOSIS — D500 Iron deficiency anemia secondary to blood loss (chronic): Secondary | ICD-10-CM | POA: Diagnosis not present

## 2024-09-30 DIAGNOSIS — Z5112 Encounter for antineoplastic immunotherapy: Secondary | ICD-10-CM | POA: Diagnosis not present

## 2024-09-30 DIAGNOSIS — C9000 Multiple myeloma not having achieved remission: Secondary | ICD-10-CM

## 2024-09-30 LAB — CBC WITH DIFFERENTIAL (CANCER CENTER ONLY)
Abs Immature Granulocytes: 0 K/uL (ref 0.00–0.07)
Basophils Absolute: 0.1 K/uL (ref 0.0–0.1)
Basophils Relative: 1 %
Eosinophils Absolute: 0.4 K/uL (ref 0.0–0.5)
Eosinophils Relative: 7 %
HCT: 39.6 % (ref 39.0–52.0)
Hemoglobin: 13.3 g/dL (ref 13.0–17.0)
Immature Granulocytes: 0 %
Lymphocytes Relative: 25 %
Lymphs Abs: 1.3 K/uL (ref 0.7–4.0)
MCH: 30.5 pg (ref 26.0–34.0)
MCHC: 33.6 g/dL (ref 30.0–36.0)
MCV: 90.8 fL (ref 80.0–100.0)
Monocytes Absolute: 0.9 K/uL (ref 0.1–1.0)
Monocytes Relative: 17 %
Neutro Abs: 2.6 K/uL (ref 1.7–7.7)
Neutrophils Relative %: 50 %
Platelet Count: 180 K/uL (ref 150–400)
RBC: 4.36 MIL/uL (ref 4.22–5.81)
RDW: 14.5 % (ref 11.5–15.5)
WBC Count: 5.2 K/uL (ref 4.0–10.5)
nRBC: 0 % (ref 0.0–0.2)

## 2024-09-30 LAB — CMP (CANCER CENTER ONLY)
ALT: 31 U/L (ref 0–44)
AST: 23 U/L (ref 15–41)
Albumin: 4.3 g/dL (ref 3.5–5.0)
Alkaline Phosphatase: 88 U/L (ref 38–126)
Anion gap: 11 (ref 5–15)
BUN: 30 mg/dL — ABNORMAL HIGH (ref 8–23)
CO2: 24 mmol/L (ref 22–32)
Calcium: 9.8 mg/dL (ref 8.9–10.3)
Chloride: 104 mmol/L (ref 98–111)
Creatinine: 1.62 mg/dL — ABNORMAL HIGH (ref 0.61–1.24)
GFR, Estimated: 43 mL/min — ABNORMAL LOW (ref >60)
Glucose, Bld: 234 mg/dL — ABNORMAL HIGH (ref 70–99)
Potassium: 4.8 mmol/L (ref 3.5–5.1)
Sodium: 138 mmol/L (ref 135–145)
Total Bilirubin: 0.3 mg/dL (ref 0.0–1.2)
Total Protein: 6.7 g/dL (ref 6.5–8.1)

## 2024-09-30 LAB — LACTATE DEHYDROGENASE: LDH: 195 U/L — ABNORMAL HIGH (ref 98–192)

## 2024-09-30 MED ORDER — DEXAMETHASONE 4 MG PO TABS: 20.0000 mg | ORAL_TABLET | Freq: Once | ORAL | Status: DC

## 2024-09-30 MED ORDER — DARATUMUMAB-HYALURONIDASE-FIHJ 1800-30000 MG-UT/15ML ~~LOC~~ SOLN
1800.0000 mg | Freq: Once | SUBCUTANEOUS | Status: AC
  Administered 2024-09-30: 1800 mg via SUBCUTANEOUS
  Filled 2024-09-30: qty 15

## 2024-09-30 MED ORDER — DIPHENHYDRAMINE HCL 25 MG PO CAPS: 50.0000 mg | ORAL_CAPSULE | Freq: Once | ORAL | Status: DC

## 2024-09-30 MED ORDER — ACETAMINOPHEN 325 MG PO TABS: 650.0000 mg | ORAL_TABLET | Freq: Once | ORAL | Status: DC

## 2024-09-30 MED ORDER — BORTEZOMIB CHEMO SQ INJECTION 3.5 MG (2.5MG/ML)
1.3000 mg/m2 | Freq: Once | INTRAMUSCULAR | Status: AC
  Administered 2024-09-30: 2.75 mg via SUBCUTANEOUS
  Filled 2024-09-30: qty 1.1

## 2024-09-30 NOTE — Patient Instructions (Signed)
 [facility] - A DEPT OF [facility]  Discharge Instructions: Thank you for choosing [facility] to provide your oncology and hematology care.   If you have a lab appointment with the Cancer Center, please go directly to the Cancer Center and check in at the registration area.  Wear comfortable clothing and clothing appropriate for easy access to any Portacath or PICC line.   We strive to give you quality time with your provider. You may need to reschedule your appointment if you arrive late (15 or more minutes).  Arriving late affects you and other patients whose appointments are after yours.  Also, if you miss three or more appointments without notifying the office, you may be dismissed from the clinic at the provider's discretion.      For prescription refill requests, have your pharmacy contact our office and allow 72 hours for refills to be completed.    Today you received the following chemotherapy and/or immunotherapy agents Velcade , Darzalex -Faspro.      To help prevent nausea and vomiting after your treatment, we encourage you to take your nausea medication as directed.  BELOW ARE SYMPTOMS THAT SHOULD BE REPORTED IMMEDIATELY: *FEVER GREATER THAN 100.4 F (38 C) OR HIGHER *CHILLS OR SWEATING *NAUSEA AND VOMITING THAT IS NOT CONTROLLED WITH YOUR NAUSEA MEDICATION *UNUSUAL SHORTNESS OF BREATH *UNUSUAL BRUISING OR BLEEDING *URINARY PROBLEMS (pain or burning when urinating, or frequent urination) *BOWEL PROBLEMS (unusual diarrhea, constipation, pain near the anus) TENDERNESS IN MOUTH AND THROAT WITH OR WITHOUT PRESENCE OF ULCERS (sore throat, sores in mouth, or a toothache) UNUSUAL RASH, SWELLING OR PAIN  UNUSUAL VAGINAL DISCHARGE OR ITCHING   Items with * indicate a potential emergency and should be followed up as soon as possible or go to the Emergency Department if any problems should occur.  Please show the CHEMOTHERAPY ALERT CARD or  IMMUNOTHERAPY ALERT CARD at check-in to the Emergency Department and triage nurse. Should you have questions after your visit or need to cancel or reschedule your appointment, please contact [facility] - A DEPT OF [facility]  [phone] and follow the prompts.  Office hours are 8:00 a.m. to 4:30 p.m. Monday - Friday. Please note that voicemails left after 4:00 p.m. may not be returned until the following business day.  We are closed weekends and major holidays. You have access to a nurse at all times for urgent questions. Please call the main number to the clinic [phone] and follow the prompts.  For any non-urgent questions, you may also contact your provider using MyChart. We now offer e-Visits for anyone 75 and older to request care online for non-urgent symptoms. For details visit [email or web address].   Also download the MyChart app! Go to the app store, search MyChart, open the app, select [place], and log in with your MyChart username and password.

## 2024-09-30 NOTE — Progress Notes (Signed)
 OK to treat with creatinine level from today per Clemetine Dais, PA.

## 2024-09-30 NOTE — Progress Notes (Signed)
 Hematology and Oncology Follow Up Visit  Michael Hankerson Sr. [id number] Apr 13, 1946 78 y.o. 09/30/2024   Principle Diagnosis:  IgG kappa smoldering myeloma - progressive Iron  deficiency anemia -- Venofer  last given on 03/2020  Current Therapy:   Faspro/Velcade /Revlimid /Dex -- s/p cycle #2 - start - on 07/29/2024     Interim History:  [name] is back for follow up and consideration of treatment.   He continues to do well. His constipation has resolved. He reports no side effects from treatment.   His myeloma studies have improved quite nicely.  His monoclonal spike is now down to 0.4 g/dL.  His IgG level is down to 1203 mg/dL.  The Kappa light chain is down to 3.2 mg/dL.  He has had no rashes.  He has had no bleeding.  He has had no fever.  He has had no cough.  He has had no tingling in the hands or feet.  He has had no rashes.  Overall, I would say that his performance status is probably ECOG 0.   Wt Readings from Last 3 Encounters:  09/30/24 204 lb (92.5 kg)  09/09/24 202 lb 12.8 oz (92 kg)  08/27/24 205 lb 9.6 oz (93.3 kg)   Medications:  Current Outpatient Medications:    Accu-Chek Softclix Lancets lancets, Use to check blood sugar once daily Dx code: E11.9, Disp: 100 each, Rfl: 1   acetaminophen  (TYLENOL ) 500 MG tablet, Take 1,000 mg by mouth., Disp: , Rfl:    albuterol  (VENTOLIN  HFA) 108 (90 Base) MCG/ACT inhaler, Inhale 1-2 puffs into the lungs every 6 (six) hours as needed for wheezing or shortness of breath., Disp: 8.5 each, Rfl: 6   aspirin  325 MG tablet, Take 1 tablet (325 mg total) by mouth daily., Disp: 100 tablet, Rfl: 3   atorvastatin  (LIPITOR) 20 MG tablet, Take 1 tablet (20 mg total) by mouth daily., Disp: 90 tablet, Rfl: 3   Blood Glucose Monitoring Suppl (ACCU-CHEK GUIDE ME) w/Device KIT, Check blood glucose once daily, Disp: 1 kit, Rfl: 0   dexamethasone  (DECADRON ) 4 MG tablet, Take 5 tabs (20 mg) weekly the day after daratumumab  for 12 weeks. Take with  breakfast., Disp: 20 tablet, Rfl: 5   DHA-EPA-Vitamin E (OMEGA-3 COMPLEX) 192-251-11 MG-MG-UNIT CAPS, Take 1 capsule by mouth every other day., Disp: , Rfl:    empagliflozin  (JARDIANCE ) 25 MG TABS tablet, Take 1 tablet (25 mg total) by mouth daily before breakfast., Disp: 90 tablet, Rfl: 1   famciclovir  (FAMVIR ) 250 MG tablet, Take 1 tablet (250 mg total) by mouth 2 (two) times daily. Please start this 3 days prior to starting chemotherapy in August., Disp: 30 tablet, Rfl: 12   ferrous sulfate  325 (65 FE) MG tablet, Take 325 mg by mouth every other day., Disp: , Rfl:    finasteride (PROSCAR) 5 MG tablet, Take 5 mg by mouth daily., Disp: , Rfl:    Ginkgo Biloba 40 MG TABS, Take 1 tablet by mouth daily. , Disp: , Rfl:    glucosamine-chondroitin 500-400 MG tablet, Take 1 tablet by mouth daily., Disp: , Rfl:    glucose blood (ACCU-CHEK GUIDE TEST) test strip, Use to check blood sugar once daily Dx code: E11.9, Disp: 100 each, Rfl: 12   latanoprost  (XALATAN ) 0.005 % ophthalmic solution, Place 1 drop into both eyes at bedtime. , Disp: , Rfl:    lenalidomide  (REVLIMID ) 10 MG capsule, Take 1 capsule (10 mg total) by mouth daily for 14 days. Take 1 capsule (10 mg total)  by mouth daily for 14 days on then 14 days off. Authorization number: [id number]   Date Obtained: 09/23/2024, Disp: 14 capsule, Rfl: 0   montelukast  (SINGULAIR ) 10 MG tablet, Take 1 tablet 1 hour to Darzalex  (Faspro), Disp: 30 tablet, Rfl: 2   Multiple Vitamin (MULTIVITAMIN WITH MINERALS) TABS, Take 1 tablet by mouth daily. , Disp: , Rfl:    ondansetron  (ZOFRAN ) 8 MG tablet, Take 1 tablet (8 mg total) by mouth every 8 (eight) hours as needed for nausea or vomiting., Disp: 30 tablet, Rfl: 1   prochlorperazine  (COMPAZINE ) 10 MG tablet, Take 1 tablet (10 mg total) by mouth every 6 (six) hours as needed for nausea or vomiting., Disp: 30 tablet, Rfl: 1   sacubitril -valsartan  (ENTRESTO ) 49-51 MG, Take 1 tablet by mouth 2 (two) times daily., Disp: 180  tablet, Rfl: 0   Semaglutide ,0.25 or 0.5MG /DOS, (OZEMPIC , 0.25 OR 0.5 MG/DOSE,) 2 MG/3ML SOPN, Inject 0.5 mg into the skin once a week., Disp: , Rfl:    sodium bicarbonate 650 MG tablet, Take 650 mg by mouth daily., Disp: , Rfl:    tamsulosin (FLOMAX) 0.4 MG CAPS capsule, Take 2 capsules (0.8 mg total) by mouth at bedtime., Disp: , Rfl:    carvedilol  (COREG ) 25 MG tablet, TAKE 1 AND 1/2 TABLETS (37.5 MG TOTAL) BY MOUTH 2 (TWO) TIMES DAILY WITH A MEAL. (Patient not taking: Reported on 09/30/2024), Disp: 270 tablet, Rfl: 2 No current facility-administered medications for this visit.  Facility-Administered Medications Ordered in Other Visits:    acetaminophen  (TYLENOL ) tablet 650 mg, 650 mg, Oral, Once, Ennever, Maude SAUNDERS, MD   dexamethasone  (DECADRON ) tablet 20 mg, 20 mg, Oral, Once, Ennever, Maude SAUNDERS, MD   diphenhydrAMINE  (BENADRYL ) capsule 50 mg, 50 mg, Oral, Once, Ennever, Maude SAUNDERS, MD  Allergies:  Allergies  Allergen Reactions   Atorvastatin  Other (See Comments)     hard to focus   Rosuvastatin Other (See Comments)    unable to focus   Amlodipine  Swelling    Past Medical History, Surgical history, Social history, and Family History were reviewed and updated.  Review of Systems: Review of Systems  Constitutional: Negative.   HENT: Negative.    Eyes: Negative.   Respiratory: Negative.    Cardiovascular: Negative.   Gastrointestinal: Negative.   Genitourinary: Negative.   Musculoskeletal: Negative.   Skin: Negative.   Neurological: Negative.   Endo/Heme/Allergies: Negative.   Psychiatric/Behavioral: Negative.     Physical Exam: Vitals:   09/30/24 1048  BP: (!) 141/64  Pulse: (!) 52  Resp: 18  Temp: 97.8 F (36.6 C)  SpO2: 100%    Physical Exam Vitals reviewed.  HENT:     Head: Normocephalic and atraumatic.  Eyes:     Pupils: Pupils are equal, round, and reactive to light.  Cardiovascular:     Rate and Rhythm: Normal rate and regular rhythm.     Heart sounds:  Normal heart sounds.  Pulmonary:     Effort: Pulmonary effort is normal.     Breath sounds: Normal breath sounds.  Abdominal:     General: Bowel sounds are normal.     Palpations: Abdomen is soft.  Musculoskeletal:        General: No tenderness or deformity. Normal range of motion.     Cervical back: Normal range of motion.  Lymphadenopathy:     Cervical: No cervical adenopathy.  Skin:    General: Skin is warm and dry.     Findings: No erythema or rash.  Neurological:  Mental Status: He is alert and oriented to person, place, and time.  Psychiatric:        Behavior: Behavior normal.        Thought Content: Thought content normal.        Judgment: Judgment normal.      Lab Results  Component Value Date   WBC 5.2 09/30/2024   HGB 13.3 09/30/2024   HCT 39.6 09/30/2024   MCV 90.8 09/30/2024   PLT 180 09/30/2024     Chemistry      Component Value Date/Time   NA 138 09/30/2024 1009   NA 133 (L) 01/12/2020 1015   NA 142 10/03/2017 0910   NA 137 04/03/2017 0852   K 4.8 09/30/2024 1009   K 3.9 10/03/2017 0910   K 4.2 04/03/2017 0852   CL 104 09/30/2024 1009   CL 103 10/03/2017 0910   CL 108 (H) 05/15/2013 0912   CO2 24 09/30/2024 1009   CO2 28 10/03/2017 0910   CO2 26 04/03/2017 0852   BUN 30 (H) 09/30/2024 1009   BUN 25 01/12/2020 1015   BUN 13 10/03/2017 0910   BUN 16.3 04/03/2017 0852   CREATININE 1.62 (H) 09/30/2024 1009   CREATININE 1.07 11/16/2017 1437   CREATININE 1.0 04/03/2017 0852      Component Value Date/Time   CALCIUM  9.8 09/30/2024 1009   CALCIUM  9.3 10/03/2017 0910   CALCIUM  9.0 04/03/2017 0852   ALKPHOS 88 09/30/2024 1009   ALKPHOS 51 10/03/2017 0910   ALKPHOS 46 04/03/2017 0852   AST 23 09/30/2024 1009   AST 18 04/03/2017 0852   ALT 31 09/30/2024 1009   ALT 34 10/03/2017 0910   ALT 19 04/03/2017 0852   BILITOT 0.3 09/30/2024 1009   BILITOT 0.38 04/03/2017 0852     Encounter Diagnosis  Name Primary?   Multiple myeloma not having  achieved remission (HCC) Yes    Impression and Plan: Mr. Pask is 77 year old gentleman with IgG kappa smoldering myeloma. He has done really well on Faspro/Velcade /Revlimid /Dextreatment thus far.   CBC and CMP acceptable for Cycle 4 Day 1 today RTC 7 days labs, Cycle 4 Day 8 RTC 14 days labs, Cycle 4 day 15 RTC 21 days MD, labs, Cycle 5 Day 1   Lauraine CHRISTELLA Dais, PA-C 10/14/202512:53 PM

## 2024-10-01 ENCOUNTER — Ambulatory Visit: Payer: Self-pay | Admitting: Hematology & Oncology

## 2024-10-01 LAB — IGG, IGA, IGM
IgA: 130 mg/dL (ref 61–437)
IgG (Immunoglobin G), Serum: 1039 mg/dL (ref 603–1613)
IgM (Immunoglobulin M), Srm: 19 mg/dL (ref 15–143)

## 2024-10-01 LAB — KAPPA/LAMBDA LIGHT CHAINS
Kappa free light chain: 23.3 mg/L — ABNORMAL HIGH (ref 3.3–19.4)
Kappa, lambda light chain ratio: 1.63 (ref 0.26–1.65)
Lambda free light chains: 14.3 mg/L (ref 5.7–26.3)

## 2024-10-01 NOTE — Telephone Encounter (Signed)
 Advised via MyChart.

## 2024-10-01 NOTE — Telephone Encounter (Signed)
-----   Message from Maude JONELLE Crease sent at 10/01/2024  3:49 PM EDT ----- Please call and let him know that the light chains still keeps coming down.  It went from 32 down to 23.  Excellent job.  Jeralyn ----- Message ----- From: Rebecka, Lab In [place] Sent: 09/30/2024  10:32 AM EDT To: Maude JONELLE Crease, MD

## 2024-10-02 ENCOUNTER — Inpatient Hospital Stay

## 2024-10-02 ENCOUNTER — Ambulatory Visit: Payer: Medicare Other

## 2024-10-02 DIAGNOSIS — Z5112 Encounter for antineoplastic immunotherapy: Secondary | ICD-10-CM | POA: Diagnosis not present

## 2024-10-02 DIAGNOSIS — I5022 Chronic systolic (congestive) heart failure: Secondary | ICD-10-CM | POA: Diagnosis not present

## 2024-10-02 DIAGNOSIS — C9002 Multiple myeloma in relapse: Secondary | ICD-10-CM

## 2024-10-02 LAB — CUP PACEART REMOTE DEVICE CHECK
Battery Remaining Longevity: 17 mo
Battery Remaining Percentage: 17 %
Battery Voltage: 2.89 V
Brady Statistic AP VP Percent: 21 %
Brady Statistic AP VS Percent: 1 %
Brady Statistic AS VP Percent: 75 %
Brady Statistic AS VS Percent: 2.1 %
Brady Statistic RA Percent Paced: 20 %
Date Time Interrogation Session: 20251016040013
Implantable Lead Connection Status: 753985
Implantable Lead Connection Status: 753985
Implantable Lead Connection Status: 753985
Implantable Lead Implant Date: 20160822
Implantable Lead Implant Date: 20160822
Implantable Lead Implant Date: 20190118
Implantable Lead Location: 753858
Implantable Lead Location: 753859
Implantable Lead Location: 753860
Implantable Pulse Generator Implant Date: 20190118
Lead Channel Impedance Value: 1175 Ohm
Lead Channel Impedance Value: 390 Ohm
Lead Channel Impedance Value: 550 Ohm
Lead Channel Pacing Threshold Amplitude: 0.625 V
Lead Channel Pacing Threshold Amplitude: 1.125 V
Lead Channel Pacing Threshold Amplitude: 1.625 V
Lead Channel Pacing Threshold Pulse Width: 0.5 ms
Lead Channel Pacing Threshold Pulse Width: 0.5 ms
Lead Channel Pacing Threshold Pulse Width: 0.5 ms
Lead Channel Sensing Intrinsic Amplitude: 12 mV
Lead Channel Sensing Intrinsic Amplitude: 2.2 mV
Lead Channel Setting Pacing Amplitude: 1.625
Lead Channel Setting Pacing Amplitude: 2.125
Lead Channel Setting Pacing Amplitude: 2.625
Lead Channel Setting Pacing Pulse Width: 0.5 ms
Lead Channel Setting Pacing Pulse Width: 0.5 ms
Lead Channel Setting Sensing Sensitivity: 4 mV
Pulse Gen Model: 3262
Pulse Gen Serial Number: 8983138

## 2024-10-03 ENCOUNTER — Ambulatory Visit: Payer: Self-pay | Admitting: Internal Medicine

## 2024-10-03 LAB — PROTEIN ELECTROPHORESIS, SERUM, WITH REFLEX
A/G Ratio: 1.1 (ref 0.7–1.7)
Albumin ELP: 3.5 g/dL (ref 2.9–4.4)
Alpha-1-Globulin: 0.2 g/dL (ref 0.0–0.4)
Alpha-2-Globulin: 0.9 g/dL (ref 0.4–1.0)
Beta Globulin: 0.9 g/dL (ref 0.7–1.3)
Gamma Globulin: 1 g/dL (ref 0.4–1.8)
Globulin, Total: 3.1 g/dL (ref 2.2–3.9)
M-Spike, %: 0.4 g/dL — ABNORMAL HIGH
SPEP Interpretation: 0
Total Protein ELP: 6.6 g/dL (ref 6.0–8.5)

## 2024-10-03 LAB — IMMUNOFIXATION REFLEX, SERUM
IgA: 131 mg/dL (ref 61–437)
IgG (Immunoglobin G), Serum: 1027 mg/dL (ref 603–1613)
IgM (Immunoglobulin M), Srm: 20 mg/dL (ref 15–143)

## 2024-10-06 LAB — UPEP/UIFE/LIGHT CHAINS/TP, 24-HR UR
% BETA, Urine: 11.6 %
ALPHA 1 URINE: 6.7 %
Albumin, U: 68.7 %
Alpha 2, Urine: 7.7 %
Free Kappa Lt Chains,Ur: 20.02 mg/L (ref 1.17–86.46)
Free Kappa/Lambda Ratio: 6.32 (ref 1.83–14.26)
Free Lambda Lt Chains,Ur: 3.17 mg/L (ref 0.27–15.21)
GAMMA GLOBULIN URINE: 5.3 %
Total Protein, Urine-Ur/day: 312 mg/(24.h) — ABNORMAL HIGH (ref 30–150)
Total Protein, Urine: 10.4 mg/dL
Total Volume: 3000

## 2024-10-07 ENCOUNTER — Inpatient Hospital Stay

## 2024-10-07 VITALS — BP 136/72 | HR 61 | Temp 98.0°F | Resp 18

## 2024-10-07 DIAGNOSIS — C9002 Multiple myeloma in relapse: Secondary | ICD-10-CM

## 2024-10-07 DIAGNOSIS — Z5112 Encounter for antineoplastic immunotherapy: Secondary | ICD-10-CM | POA: Diagnosis not present

## 2024-10-07 LAB — CBC WITH DIFFERENTIAL (CANCER CENTER ONLY)
Abs Immature Granulocytes: 0.01 K/uL (ref 0.00–0.07)
Basophils Absolute: 0 K/uL (ref 0.0–0.1)
Basophils Relative: 1 %
Eosinophils Absolute: 0.4 K/uL (ref 0.0–0.5)
Eosinophils Relative: 6 %
HCT: 40.4 % (ref 39.0–52.0)
Hemoglobin: 13.9 g/dL (ref 13.0–17.0)
Immature Granulocytes: 0 %
Lymphocytes Relative: 16 %
Lymphs Abs: 1 K/uL (ref 0.7–4.0)
MCH: 31.1 pg (ref 26.0–34.0)
MCHC: 34.4 g/dL (ref 30.0–36.0)
MCV: 90.4 fL (ref 80.0–100.0)
Monocytes Absolute: 0.7 K/uL (ref 0.1–1.0)
Monocytes Relative: 11 %
Neutro Abs: 4 K/uL (ref 1.7–7.7)
Neutrophils Relative %: 66 %
Platelet Count: 182 K/uL (ref 150–400)
RBC: 4.47 MIL/uL (ref 4.22–5.81)
RDW: 14.6 % (ref 11.5–15.5)
WBC Count: 6 K/uL (ref 4.0–10.5)
nRBC: 0 % (ref 0.0–0.2)

## 2024-10-07 LAB — CMP (CANCER CENTER ONLY)
ALT: 30 U/L (ref 0–44)
AST: 23 U/L (ref 15–41)
Albumin: 4.2 g/dL (ref 3.5–5.0)
Alkaline Phosphatase: 86 U/L (ref 38–126)
Anion gap: 13 (ref 5–15)
BUN: 29 mg/dL — ABNORMAL HIGH (ref 8–23)
CO2: 21 mmol/L — ABNORMAL LOW (ref 22–32)
Calcium: 9.3 mg/dL (ref 8.9–10.3)
Chloride: 100 mmol/L (ref 98–111)
Creatinine: 1.73 mg/dL — ABNORMAL HIGH (ref 0.61–1.24)
GFR, Estimated: 40 mL/min — ABNORMAL LOW (ref >60)
Glucose, Bld: 152 mg/dL — ABNORMAL HIGH (ref 70–99)
Potassium: 4.6 mmol/L (ref 3.5–5.1)
Sodium: 134 mmol/L — ABNORMAL LOW (ref 135–145)
Total Bilirubin: 0.3 mg/dL (ref 0.0–1.2)
Total Protein: 6.8 g/dL (ref 6.5–8.1)

## 2024-10-07 MED ORDER — DEXAMETHASONE 4 MG PO TABS: 20.0000 mg | ORAL_TABLET | Freq: Once | ORAL | Status: DC

## 2024-10-07 MED ORDER — ACETAMINOPHEN 325 MG PO TABS: 650.0000 mg | ORAL_TABLET | Freq: Once | ORAL | Status: DC

## 2024-10-07 MED ORDER — BORTEZOMIB CHEMO SQ INJECTION 3.5 MG (2.5MG/ML)
1.3000 mg/m2 | Freq: Once | INTRAMUSCULAR | Status: AC
  Administered 2024-10-07: 2.75 mg via SUBCUTANEOUS
  Filled 2024-10-07: qty 1.1

## 2024-10-07 MED ORDER — DARATUMUMAB-HYALURONIDASE-FIHJ 1800-30000 MG-UT/15ML ~~LOC~~ SOLN
1800.0000 mg | Freq: Once | SUBCUTANEOUS | Status: AC
  Administered 2024-10-07: 1800 mg via SUBCUTANEOUS
  Filled 2024-10-07: qty 15

## 2024-10-07 MED ORDER — DIPHENHYDRAMINE HCL 25 MG PO CAPS: 50.0000 mg | ORAL_CAPSULE | Freq: Once | ORAL | Status: DC

## 2024-10-07 NOTE — Patient Instructions (Signed)
 [facility]  Discharge Instructions: Thank you for choosing [facility] to provide your oncology and hematology care.   If you have a lab appointment with the Cancer Center, please go directly to the Cancer Center and check in at the registration area.  Wear comfortable clothing and clothing appropriate for easy access to any Portacath or PICC line.   We strive to give you quality time with your provider. You may need to reschedule your appointment if you arrive late (15 or more minutes).  Arriving late affects you and other patients whose appointments are after yours.  Also, if you miss three or more appointments without notifying the office, you may be dismissed from the clinic at the provider's discretion.      For prescription refill requests, have your pharmacy contact our office and allow 72 hours for refills to be completed.    Today you received the following chemotherapy and/or immunotherapy agents:  Faspro and Velcade      To help prevent nausea and vomiting after your treatment, we encourage you to take your nausea medication as directed.  BELOW ARE SYMPTOMS THAT SHOULD BE REPORTED IMMEDIATELY: *FEVER GREATER THAN 100.4 F (38 C) OR HIGHER *CHILLS OR SWEATING *NAUSEA AND VOMITING THAT IS NOT CONTROLLED WITH YOUR NAUSEA MEDICATION *UNUSUAL SHORTNESS OF BREATH *UNUSUAL BRUISING OR BLEEDING *URINARY PROBLEMS (pain or burning when urinating, or frequent urination) *BOWEL PROBLEMS (unusual diarrhea, constipation, pain near the anus) TENDERNESS IN MOUTH AND THROAT WITH OR WITHOUT PRESENCE OF ULCERS (sore throat, sores in mouth, or a toothache) UNUSUAL RASH, SWELLING OR PAIN  UNUSUAL VAGINAL DISCHARGE OR ITCHING   Items with * indicate a potential emergency and should be followed up as soon as possible or go to the Emergency Department if any problems should occur.  Please show the CHEMOTHERAPY ALERT CARD or  IMMUNOTHERAPY ALERT CARD at check-in to the Emergency Department and triage nurse. Should you have questions after your visit or need to cancel or reschedule your appointment, please contact [facility] [facility] - A DEPT OF [facility]  [phone] and follow the prompts.  Office hours are 8:00 a.m. to 4:30 p.m. Monday - Friday. Please note that voicemails left after 4:00 p.m. may not be returned until the following business day.  We are closed weekends and major holidays. You have access to a nurse at all times for urgent questions. Please call the main number to the clinic [phone] and follow the prompts.  For any non-urgent questions, you may also contact your provider using MyChart. We now offer e-Visits for anyone 24 and older to request care online for non-urgent symptoms. For details visit [email or web address].   Also download the MyChart app! Go to the app store, search "MyChart", open the app, select [place], and log in with your MyChart username and password.

## 2024-10-08 NOTE — Progress Notes (Signed)
 Remote PPM Transmission

## 2024-10-14 ENCOUNTER — Inpatient Hospital Stay

## 2024-10-14 VITALS — BP 130/68 | HR 42 | Temp 97.6°F | Resp 18

## 2024-10-14 DIAGNOSIS — C9002 Multiple myeloma in relapse: Secondary | ICD-10-CM

## 2024-10-14 DIAGNOSIS — Z5112 Encounter for antineoplastic immunotherapy: Secondary | ICD-10-CM | POA: Diagnosis not present

## 2024-10-14 LAB — CBC WITH DIFFERENTIAL (CANCER CENTER ONLY)
Abs Immature Granulocytes: 0.02 K/uL (ref 0.00–0.07)
Basophils Absolute: 0.1 K/uL (ref 0.0–0.1)
Basophils Relative: 1 %
Eosinophils Absolute: 0.4 K/uL (ref 0.0–0.5)
Eosinophils Relative: 6 %
HCT: 39.2 % (ref 39.0–52.0)
Hemoglobin: 13 g/dL (ref 13.0–17.0)
Immature Granulocytes: 0 %
Lymphocytes Relative: 13 %
Lymphs Abs: 0.9 K/uL (ref 0.7–4.0)
MCH: 30.1 pg (ref 26.0–34.0)
MCHC: 33.2 g/dL (ref 30.0–36.0)
MCV: 90.7 fL (ref 80.0–100.0)
Monocytes Absolute: 1 K/uL (ref 0.1–1.0)
Monocytes Relative: 15 %
Neutro Abs: 4.5 K/uL (ref 1.7–7.7)
Neutrophils Relative %: 65 %
Platelet Count: 181 K/uL (ref 150–400)
RBC: 4.32 MIL/uL (ref 4.22–5.81)
RDW: 14.1 % (ref 11.5–15.5)
WBC Count: 6.9 K/uL (ref 4.0–10.5)
nRBC: 0 % (ref 0.0–0.2)

## 2024-10-14 LAB — CMP (CANCER CENTER ONLY)
ALT: 37 U/L (ref 0–44)
AST: 25 U/L (ref 15–41)
Albumin: 4.2 g/dL (ref 3.5–5.0)
Alkaline Phosphatase: 77 U/L (ref 38–126)
Anion gap: 10 (ref 5–15)
BUN: 25 mg/dL — ABNORMAL HIGH (ref 8–23)
CO2: 22 mmol/L (ref 22–32)
Calcium: 9.1 mg/dL (ref 8.9–10.3)
Chloride: 101 mmol/L (ref 98–111)
Creatinine: 1.77 mg/dL — ABNORMAL HIGH (ref 0.61–1.24)
GFR, Estimated: 39 mL/min — ABNORMAL LOW (ref >60)
Glucose, Bld: 181 mg/dL — ABNORMAL HIGH (ref 70–99)
Potassium: 4.3 mmol/L (ref 3.5–5.1)
Sodium: 134 mmol/L — ABNORMAL LOW (ref 135–145)
Total Bilirubin: 0.4 mg/dL (ref 0.0–1.2)
Total Protein: 6.6 g/dL (ref 6.5–8.1)

## 2024-10-14 MED ORDER — ACETAMINOPHEN 325 MG PO TABS: 650.0000 mg | ORAL_TABLET | Freq: Once | ORAL | Status: DC

## 2024-10-14 MED ORDER — DEXAMETHASONE 4 MG PO TABS: 20.0000 mg | ORAL_TABLET | Freq: Once | ORAL | Status: DC

## 2024-10-14 MED ORDER — DARATUMUMAB-HYALURONIDASE-FIHJ 1800-30000 MG-UT/15ML ~~LOC~~ SOLN
1800.0000 mg | Freq: Once | SUBCUTANEOUS | Status: AC
  Administered 2024-10-14: 1800 mg via SUBCUTANEOUS
  Filled 2024-10-14: qty 15

## 2024-10-14 MED ORDER — DIPHENHYDRAMINE HCL 25 MG PO CAPS: 50.0000 mg | ORAL_CAPSULE | Freq: Once | ORAL | Status: DC

## 2024-10-14 MED ORDER — BORTEZOMIB CHEMO SQ INJECTION 3.5 MG (2.5MG/ML)
1.3000 mg/m2 | Freq: Once | INTRAMUSCULAR | Status: AC
  Administered 2024-10-14: 2.75 mg via SUBCUTANEOUS
  Filled 2024-10-14: qty 1.1

## 2024-10-14 NOTE — Patient Instructions (Signed)
 [facility]  Discharge Instructions: Thank you for choosing [facility] to provide your oncology and hematology care.   If you have a lab appointment with the Cancer Center, please go directly to the Cancer Center and check in at the registration area.  Wear comfortable clothing and clothing appropriate for easy access to any Portacath or PICC line.   We strive to give you quality time with your provider. You may need to reschedule your appointment if you arrive late (15 or more minutes).  Arriving late affects you and other patients whose appointments are after yours.  Also, if you miss three or more appointments without notifying the office, you may be dismissed from the clinic at the provider's discretion.      For prescription refill requests, have your pharmacy contact our office and allow 72 hours for refills to be completed.    Today you received the following chemotherapy and/or immunotherapy agents:  Faspro and Velcade      To help prevent nausea and vomiting after your treatment, we encourage you to take your nausea medication as directed.  BELOW ARE SYMPTOMS THAT SHOULD BE REPORTED IMMEDIATELY: *FEVER GREATER THAN 100.4 F (38 C) OR HIGHER *CHILLS OR SWEATING *NAUSEA AND VOMITING THAT IS NOT CONTROLLED WITH YOUR NAUSEA MEDICATION *UNUSUAL SHORTNESS OF BREATH *UNUSUAL BRUISING OR BLEEDING *URINARY PROBLEMS (pain or burning when urinating, or frequent urination) *BOWEL PROBLEMS (unusual diarrhea, constipation, pain near the anus) TENDERNESS IN MOUTH AND THROAT WITH OR WITHOUT PRESENCE OF ULCERS (sore throat, sores in mouth, or a toothache) UNUSUAL RASH, SWELLING OR PAIN  UNUSUAL VAGINAL DISCHARGE OR ITCHING   Items with * indicate a potential emergency and should be followed up as soon as possible or go to the Emergency Department if any problems should occur.  Please show the CHEMOTHERAPY ALERT CARD or  IMMUNOTHERAPY ALERT CARD at check-in to the Emergency Department and triage nurse. Should you have questions after your visit or need to cancel or reschedule your appointment, please contact [facility] [facility] - A DEPT OF [facility]  [phone] and follow the prompts.  Office hours are 8:00 a.m. to 4:30 p.m. Monday - Friday. Please note that voicemails left after 4:00 p.m. may not be returned until the following business day.  We are closed weekends and major holidays. You have access to a nurse at all times for urgent questions. Please call the main number to the clinic [phone] and follow the prompts.  For any non-urgent questions, you may also contact your provider using MyChart. We now offer e-Visits for anyone 24 and older to request care online for non-urgent symptoms. For details visit [email or web address].   Also download the MyChart app! Go to the app store, search "MyChart", open the app, select [place], and log in with your MyChart username and password.

## 2024-10-17 ENCOUNTER — Other Ambulatory Visit: Payer: Self-pay | Admitting: Pharmacist

## 2024-10-17 DIAGNOSIS — E1122 Type 2 diabetes mellitus with diabetic chronic kidney disease: Secondary | ICD-10-CM

## 2024-10-17 NOTE — Progress Notes (Signed)
 Pharmacy Note  10/17/2024 Name: Michael Sterling Sr. MRN: [id number] DOB: 02-04-1946  Subjective: Michael Jowers Sr. is a 78 y.o. year old male who is a primary care patient of [name], Harlene BROCKS, MD. Clinical Pharmacist Practitioner referral was placed to assist with medication, CHF and diabetes management.     Medication Management:  Patient has been getting Entresto  and Jardiance  thru the [employer] fund - approved thru 06/17/2025  He has no cost related to Entresto  and Jardiance  at this time.   He was approved for Novo medication assistance program for Ozempic  01/09/2024 thru 12/17/2024    Type 2 DM -  Current therapy - Jardiance  25mg  daily and Ozempic  0.5mg  weekly.  Checking blood glucose at home 1 or 2 times per day.  He has started chemo treatments for multiple myeloma and has to premedication once a week with dexamethasone . Since he has started dexamethasone  he has notice that his blood glucose is a little higher the day after his dose.  He has a blood glucose reading of 256 after his last dexamethasone  dose.  Usually blood glucose is 120's in the morning except after he has taken dexamethasone .   Wt Readings from Last 3 Encounters:  09/30/24 204 lb (92.5 kg)  09/09/24 202 lb 12.8 oz (92 kg)  08/27/24 205 lb 9.6 oz (93.3 kg)    Denies hypoglycemia and signs of hyperglycemia.  He reports his weight has been stable over the last 3 months; Even a slight increase per last 3 encounter weights.  He does report some constipation but mostly occurs the 2 weeks he is taking Revlimid    Objective: Review of patient status, including review of consultants reports, laboratory and other test data, was performed as part of comprehensive evaluation and provision of chronic care management services.   BP Readings from Last 3 Encounters:  10/14/24 130/68  10/07/24 136/72  09/30/24 (!) 144/87     Lab Results  Component Value Date   CREATININE 1.77 (H) 10/14/2024    CREATININE 1.73 (H) 10/07/2024   CREATININE 1.62 (H) 09/30/2024    Lab Results  Component Value Date   HGBA1C 8.5 (H) 08/27/2024       Component Value Date/Time   CHOL 116 02/25/2024 1426   CHOL 151 12/26/2017 0847   TRIG 143.0 02/25/2024 1426   HDL 27.70 (L) 02/25/2024 1426   HDL 34 (L) 12/26/2017 0847   CHOLHDL 4 02/25/2024 1426   VLDL 28.6 02/25/2024 1426   LDLCALC 59 02/25/2024 1426   LDLCALC 104 (H) 12/26/2017 0847     Clinical ASCVD: Yes  The ASCVD Risk score (Arnett DK, et al., 2019) failed to calculate for the following reasons:   The valid total cholesterol range is 130 to 320 mg/dL    BP Readings from Last 3 Encounters:  10/14/24 130/68  10/07/24 136/72  09/30/24 (!) 144/87    Allergies  Allergen Reactions   Atorvastatin  Other (See Comments)     hard to focus   Rosuvastatin Other (See Comments)    unable to focus   Amlodipine  Swelling    Medications Reviewed Today   Medications were not reviewed in this encounter     Patient Active Problem List   Diagnosis Date Noted   Chronic kidney disease (CKD), active medical management without dialysis 10/09/2019   Iron  deficiency anemia due to chronic blood loss 10/02/2019   Chronic systolic (congestive) heart failure (HCC) 01/04/2018   Pacemaker 12/08/2015   Mobitz type 2 second degree heart block  08/08/2015   Right bundle branch block 12/16/2014   Syncope 12/16/2014   Proteinuria due to type 2 diabetes mellitus (HCC) 11/12/2014   Encounter for therapeutic drug monitoring 03/13/2013   Multiple myeloma (HCC) 02/15/2013   Edema 01/07/2013   Diabetes mellitus due to underlying condition with other diabetic kidney complication (HCC)    Hyperlipidemia    Hypertension    MGUS (monoclonal gammopathy of unknown significance)    GERD (gastroesophageal reflux disease)       Assessment / Plan: Type 2 DM- A1c not at goal but per patient his blood glucose has improved at home; only checking fasting blood  glucose.  Continue current therapy - Ozempic  0.5mg  weekly and Jardiance  25mg  daily.  Reviewed home blood glucose goals. Patient will check 1 or 2 times per day at varying times of day. If blood glucose > 180 he will contact office.   Medication Assistance:   Continue to use [employer] for cardiomyopathy for cost of Jardiance  and Entresto   Lorrene is good thru 06/17/2025  Approved for Nov[employer] Nordisk medication assistance program for Ozempic  thru 12/17/2024. Received last shipment 08/11/2024 - we would request 1 more shipment in 2025. Starting reorder forms. Discussed that [employer] patient assistance program will not enroll Medicare patients in 2026 who have any coverage for Ozempic .  Reviewed patient's potential medication cost if he continues with current [facility] plan - He will have $440 deductible, after deductible met cost for Ozempic  would be $150 / month. Patient feels that meeting deductible would be hard ([employer] [facility] should help with meeting deductible) but he could afford $150 / month for Ozempic .   Reviewed medication list and discussed adherence.   Follow up planned in 6 to 8 weeks  Madelin Ray, PharmD Clinical Pharmacist [facility]  - [facility] [phone]

## 2024-10-21 ENCOUNTER — Encounter: Payer: Self-pay | Admitting: Hematology & Oncology

## 2024-10-21 ENCOUNTER — Other Ambulatory Visit: Payer: Self-pay

## 2024-10-21 ENCOUNTER — Inpatient Hospital Stay

## 2024-10-21 ENCOUNTER — Inpatient Hospital Stay (HOSPITAL_BASED_OUTPATIENT_CLINIC_OR_DEPARTMENT_OTHER): Admitting: Hematology & Oncology

## 2024-10-21 ENCOUNTER — Inpatient Hospital Stay: Attending: Hematology & Oncology

## 2024-10-21 VITALS — BP 140/76 | HR 66 | Temp 97.5°F | Resp 20 | Ht 75.0 in | Wt 206.1 lb

## 2024-10-21 DIAGNOSIS — K5900 Constipation, unspecified: Secondary | ICD-10-CM | POA: Diagnosis not present

## 2024-10-21 DIAGNOSIS — Z5112 Encounter for antineoplastic immunotherapy: Secondary | ICD-10-CM | POA: Diagnosis present

## 2024-10-21 DIAGNOSIS — D509 Iron deficiency anemia, unspecified: Secondary | ICD-10-CM | POA: Diagnosis not present

## 2024-10-21 DIAGNOSIS — Z79899 Other long term (current) drug therapy: Secondary | ICD-10-CM | POA: Insufficient documentation

## 2024-10-21 DIAGNOSIS — C9002 Multiple myeloma in relapse: Secondary | ICD-10-CM

## 2024-10-21 DIAGNOSIS — C9000 Multiple myeloma not having achieved remission: Secondary | ICD-10-CM

## 2024-10-21 LAB — CBC WITH DIFFERENTIAL (CANCER CENTER ONLY)
Abs Immature Granulocytes: 0.01 K/uL (ref 0.00–0.07)
Basophils Absolute: 0 K/uL (ref 0.0–0.1)
Basophils Relative: 1 %
Eosinophils Absolute: 0.5 K/uL (ref 0.0–0.5)
Eosinophils Relative: 8 %
HCT: 38.4 % — ABNORMAL LOW (ref 39.0–52.0)
Hemoglobin: 12.6 g/dL — ABNORMAL LOW (ref 13.0–17.0)
Immature Granulocytes: 0 %
Lymphocytes Relative: 21 %
Lymphs Abs: 1.4 K/uL (ref 0.7–4.0)
MCH: 29.9 pg (ref 26.0–34.0)
MCHC: 32.8 g/dL (ref 30.0–36.0)
MCV: 91.2 fL (ref 80.0–100.0)
Monocytes Absolute: 1.1 K/uL — ABNORMAL HIGH (ref 0.1–1.0)
Monocytes Relative: 16 %
Neutro Abs: 3.6 K/uL (ref 1.7–7.7)
Neutrophils Relative %: 54 %
Platelet Count: 169 K/uL (ref 150–400)
RBC: 4.21 MIL/uL — ABNORMAL LOW (ref 4.22–5.81)
RDW: 14.2 % (ref 11.5–15.5)
WBC Count: 6.5 K/uL (ref 4.0–10.5)
nRBC: 0 % (ref 0.0–0.2)

## 2024-10-21 LAB — CMP (CANCER CENTER ONLY)
ALT: 26 U/L (ref 0–44)
AST: 23 U/L (ref 15–41)
Albumin: 4.1 g/dL (ref 3.5–5.0)
Alkaline Phosphatase: 76 U/L (ref 38–126)
Anion gap: 11 (ref 5–15)
BUN: 24 mg/dL — ABNORMAL HIGH (ref 8–23)
CO2: 21 mmol/L — ABNORMAL LOW (ref 22–32)
Calcium: 9.1 mg/dL (ref 8.9–10.3)
Chloride: 105 mmol/L (ref 98–111)
Creatinine: 1.68 mg/dL — ABNORMAL HIGH (ref 0.61–1.24)
GFR, Estimated: 41 mL/min — ABNORMAL LOW (ref >60)
Glucose, Bld: 154 mg/dL — ABNORMAL HIGH (ref 70–99)
Potassium: 4.7 mmol/L (ref 3.5–5.1)
Sodium: 137 mmol/L (ref 135–145)
Total Bilirubin: 0.3 mg/dL (ref 0.0–1.2)
Total Protein: 6.5 g/dL (ref 6.5–8.1)

## 2024-10-21 MED ORDER — BORTEZOMIB CHEMO SQ INJECTION 3.5 MG (2.5MG/ML)
1.3000 mg/m2 | Freq: Once | INTRAMUSCULAR | Status: AC
  Administered 2024-10-21: 2.75 mg via SUBCUTANEOUS
  Filled 2024-10-21: qty 1.1

## 2024-10-21 MED ORDER — DEXAMETHASONE 4 MG PO TABS: 20.0000 mg | ORAL_TABLET | Freq: Once | ORAL | Status: DC

## 2024-10-21 MED ORDER — LENALIDOMIDE 10 MG PO CAPS: 10.0000 mg | ORAL_CAPSULE | Freq: Every day | ORAL | 0 refills | Status: AC

## 2024-10-21 MED ORDER — ACETAMINOPHEN 325 MG PO TABS: 650.0000 mg | ORAL_TABLET | Freq: Once | ORAL | Status: DC

## 2024-10-21 MED ORDER — DARATUMUMAB-HYALURONIDASE-FIHJ 1800-30000 MG-UT/15ML ~~LOC~~ SOLN
1800.0000 mg | Freq: Once | SUBCUTANEOUS | Status: AC
  Administered 2024-10-21: 1800 mg via SUBCUTANEOUS
  Filled 2024-10-21: qty 15

## 2024-10-21 MED ORDER — DIPHENHYDRAMINE HCL 25 MG PO CAPS: 50.0000 mg | ORAL_CAPSULE | Freq: Once | ORAL | Status: DC

## 2024-10-21 NOTE — Progress Notes (Signed)
 Hematology and Oncology Follow Up Visit  Michael Beckner Sr. [id number] 01-01-1946 78 y.o. 10/21/2024   Principle Diagnosis:  IgG kappa smoldering myeloma - progressive Iron  deficiency anemia -- Venofer  last given on 03/2020  Current Therapy:   Faspro/Velcade /Revlimid /Dex -- s/p cycle #4 - start - on 07/29/2024 -Velcade  DC'd on 11/11/2024     Interim History:  [name] is back for follow up.  Overall, he is doing incredibly well.  His last myeloma studies were down quite nicely.  His monoclonal spike was 0.4 g/dL.  The IgG level was down to 1023 mg/dL.  The Kappa light chain was down to 2.3 mg/dL.  Think that where the point that we may be able to make an adjustment with his protocol.  I think we can probably take out the Velcade .  I think this would be reasonable so he does not get problems with neurotoxicity.  He has had no problems with fever.  He has had no nausea or vomiting.  He has little bit of soreness on the tongue.  This is on the left side of the tongue.  He has had a little bit of constipation.  MiraLAX  helps.  He has had no bleeding.  He has had no itching.  He has had no headache.  Overall, I would say that his performance status is probably ECOG 0.     Wt Readings from Last 3 Encounters:  10/21/24 206 lb 1.3 oz (93.5 kg)  09/30/24 204 lb (92.5 kg)  09/09/24 202 lb 12.8 oz (92 kg)   Medications:  Current Outpatient Medications:    Accu-Chek Softclix Lancets lancets, Use to check blood sugar once daily Dx code: E11.9, Disp: 100 each, Rfl: 1   acetaminophen  (TYLENOL ) 500 MG tablet, Take 1,000 mg by mouth., Disp: , Rfl:    albuterol  (VENTOLIN  HFA) 108 (90 Base) MCG/ACT inhaler, Inhale 1-2 puffs into the lungs every 6 (six) hours as needed for wheezing or shortness of breath., Disp: 8.5 each, Rfl: 6   aspirin  325 MG tablet, Take 1 tablet (325 mg total) by mouth daily., Disp: 100 tablet, Rfl: 3   atorvastatin  (LIPITOR) 20 MG tablet, Take 1 tablet (20 mg total) by mouth  daily., Disp: 90 tablet, Rfl: 3   Blood Glucose Monitoring Suppl (ACCU-CHEK GUIDE ME) w/Device KIT, Check blood glucose once daily, Disp: 1 kit, Rfl: 0   dexamethasone  (DECADRON ) 4 MG tablet, Take 5 tabs (20 mg) weekly the day after daratumumab  for 12 weeks. Take with breakfast. (Patient taking differently: Take 5 tabs (20 mg) weekly the day before daratumumab  for 12 weeks. Take with breakfast.), Disp: 20 tablet, Rfl: 5   DHA-EPA-Vitamin E (OMEGA-3 COMPLEX) 192-251-11 MG-MG-UNIT CAPS, Take 1 capsule by mouth every other day., Disp: , Rfl:    empagliflozin  (JARDIANCE ) 25 MG TABS tablet, Take 1 tablet (25 mg total) by mouth daily before breakfast., Disp: 90 tablet, Rfl: 1   famciclovir  (FAMVIR ) 250 MG tablet, Take 1 tablet (250 mg total) by mouth 2 (two) times daily. Please start this 3 days prior to starting chemotherapy in August., Disp: 30 tablet, Rfl: 12   ferrous sulfate  325 (65 FE) MG tablet, Take 325 mg by mouth every other day., Disp: , Rfl:    finasteride (PROSCAR) 5 MG tablet, Take 5 mg by mouth daily., Disp: , Rfl:    Ginkgo Biloba 40 MG TABS, Take 1 tablet by mouth daily. , Disp: , Rfl:    glucosamine-chondroitin 500-400 MG tablet, Take 1 tablet by mouth  daily. (Patient taking differently: Take 1 tablet by mouth every other day.), Disp: , Rfl:    glucose blood (ACCU-CHEK GUIDE TEST) test strip, Use to check blood sugar once daily Dx code: E11.9, Disp: 100 each, Rfl: 12   latanoprost  (XALATAN ) 0.005 % ophthalmic solution, Place 1 drop into both eyes at bedtime. , Disp: , Rfl:    lenalidomide  (REVLIMID ) 10 MG capsule, Take by mouth. Takes for 2 weeks and off for 2 weeks., Disp: , Rfl:    montelukast  (SINGULAIR ) 10 MG tablet, Take 1 tablet 1 hour to Darzalex  (Faspro), Disp: 30 tablet, Rfl: 2   Multiple Vitamin (MULTIVITAMIN WITH MINERALS) TABS, Take 1 tablet by mouth daily. , Disp: , Rfl:    ondansetron  (ZOFRAN ) 8 MG tablet, Take 1 tablet (8 mg total) by mouth every 8 (eight) hours as needed  for nausea or vomiting., Disp: 30 tablet, Rfl: 1   prochlorperazine  (COMPAZINE ) 10 MG tablet, Take 1 tablet (10 mg total) by mouth every 6 (six) hours as needed for nausea or vomiting., Disp: 30 tablet, Rfl: 1   sacubitril -valsartan  (ENTRESTO ) 49-51 MG, Take 1 tablet by mouth 2 (two) times daily. (Patient taking differently: Take 1 tablet by mouth every evening.), Disp: 180 tablet, Rfl: 0   Semaglutide ,0.25 or 0.5MG /DOS, (OZEMPIC , 0.25 OR 0.5 MG/DOSE,) 2 MG/3ML SOPN, Inject 0.5 mg into the skin once a week., Disp: , Rfl:    sodium bicarbonate 650 MG tablet, Take 650 mg by mouth daily., Disp: , Rfl:    tamsulosin (FLOMAX) 0.4 MG CAPS capsule, Take 2 capsules (0.8 mg total) by mouth at bedtime., Disp: , Rfl:    carvedilol  (COREG ) 25 MG tablet, TAKE 1 AND 1/2 TABLETS (37.5 MG TOTAL) BY MOUTH 2 (TWO) TIMES DAILY WITH A MEAL. (Patient not taking: Reported on 10/21/2024), Disp: 270 tablet, Rfl: 2  Allergies:  Allergies  Allergen Reactions   Atorvastatin  Other (See Comments)     hard to focus   Rosuvastatin Other (See Comments)    unable to focus   Amlodipine  Swelling    Past Medical History, Surgical history, Social history, and Family History were reviewed and updated.  Review of Systems: Review of Systems  Constitutional: Negative.   HENT: Negative.    Eyes: Negative.   Respiratory: Negative.    Cardiovascular: Negative.   Gastrointestinal: Negative.   Genitourinary: Negative.   Musculoskeletal: Negative.   Skin: Negative.   Neurological: Negative.   Endo/Heme/Allergies: Negative.   Psychiatric/Behavioral: Negative.     Physical Exam: Vitals:   10/21/24 1209  BP: (!) 140/76  Pulse: 66  Resp: 20  Temp: (!) 97.5 F (36.4 C)  SpO2: 100%   His weight is 206 pounds.  Physical Exam Vitals reviewed.  HENT:     Head: Normocephalic and atraumatic.  Eyes:     Pupils: Pupils are equal, round, and reactive to light.  Cardiovascular:     Rate and Rhythm: Normal rate and regular  rhythm.     Heart sounds: Normal heart sounds.  Pulmonary:     Effort: Pulmonary effort is normal.     Breath sounds: Normal breath sounds.  Abdominal:     General: Bowel sounds are normal.     Palpations: Abdomen is soft.  Musculoskeletal:        General: No tenderness or deformity. Normal range of motion.     Cervical back: Normal range of motion.  Lymphadenopathy:     Cervical: No cervical adenopathy.  Skin:    General: Skin is  warm and dry.     Findings: No erythema or rash.  Neurological:     Mental Status: He is alert and oriented to person, place, and time.  Psychiatric:        Behavior: Behavior normal.        Thought Content: Thought content normal.        Judgment: Judgment normal.      Lab Results  Component Value Date   WBC 6.5 10/21/2024   HGB 12.6 (L) 10/21/2024   HCT 38.4 (L) 10/21/2024   MCV 91.2 10/21/2024   PLT 169 10/21/2024     Chemistry      Component Value Date/Time   NA 134 (L) 10/14/2024 1120   NA 133 (L) 01/12/2020 1015   NA 142 10/03/2017 0910   NA 137 04/03/2017 0852   K 4.3 10/14/2024 1120   K 3.9 10/03/2017 0910   K 4.2 04/03/2017 0852   CL 101 10/14/2024 1120   CL 103 10/03/2017 0910   CL 108 (H) 05/15/2013 0912   CO2 22 10/14/2024 1120   CO2 28 10/03/2017 0910   CO2 26 04/03/2017 0852   BUN 25 (H) 10/14/2024 1120   BUN 25 01/12/2020 1015   BUN 13 10/03/2017 0910   BUN 16.3 04/03/2017 0852   CREATININE 1.77 (H) 10/14/2024 1120   CREATININE 1.07 11/16/2017 1437   CREATININE 1.0 04/03/2017 0852      Component Value Date/Time   CALCIUM  9.1 10/14/2024 1120   CALCIUM  9.3 10/03/2017 0910   CALCIUM  9.0 04/03/2017 0852   ALKPHOS 77 10/14/2024 1120   ALKPHOS 51 10/03/2017 0910   ALKPHOS 46 04/03/2017 0852   AST 25 10/14/2024 1120   AST 18 04/03/2017 0852   ALT 37 10/14/2024 1120   ALT 34 10/03/2017 0910   ALT 19 04/03/2017 0852   BILITOT 0.4 10/14/2024 1120   BILITOT 0.38 04/03/2017 0852      Impression and Plan: Mr.  Bullinger is 78 year old gentleman with IgG kappa smoldering myeloma. He has done really well on Faspro/Velcade /Revlimid /Dex treatment thus far.   Again, I think where the point that where we can omit the Velcade .  I think this would seriously help decrease any toxicity that he may have from the Velcade .  We will see how his myeloma studies look.  I am just happy that he is done so well.  I think we kept his myeloma levels very low, he should never have any problems.  Will plan to get him back to see us  in another 3 weeks.    Maude JONELLE Crease, MD 11/4/202512:38 PM

## 2024-10-21 NOTE — Addendum Note (Signed)
 Addended by: TIMMY COY R on: 10/21/2024 02:16 PM   Modules accepted: Orders

## 2024-10-21 NOTE — Progress Notes (Signed)
 Pt took pre meds at home at noon.

## 2024-10-21 NOTE — Patient Instructions (Signed)
 [facility] - A DEPT OF [facility]  Discharge Instructions: Thank you for choosing [facility] to provide your oncology and hematology care.   If you have a lab appointment with the Cancer Center, please go directly to the Cancer Center and check in at the registration area.  Wear comfortable clothing and clothing appropriate for easy access to any Portacath or PICC line.   We strive to give you quality time with your provider. You may need to reschedule your appointment if you arrive late (15 or more minutes).  Arriving late affects you and other patients whose appointments are after yours.  Also, if you miss three or more appointments without notifying the office, you may be dismissed from the clinic at the provider's discretion.      For prescription refill requests, have your pharmacy contact our office and allow 72 hours for refills to be completed.    Today you received the following chemotherapy and/or immunotherapy agents: Velcade and Faspro      To help prevent nausea and vomiting after your treatment, we encourage you to take your nausea medication as directed.  BELOW ARE SYMPTOMS THAT SHOULD BE REPORTED IMMEDIATELY: *FEVER GREATER THAN 100.4 F (38 C) OR HIGHER *CHILLS OR SWEATING *NAUSEA AND VOMITING THAT IS NOT CONTROLLED WITH YOUR NAUSEA MEDICATION *UNUSUAL SHORTNESS OF BREATH *UNUSUAL BRUISING OR BLEEDING *URINARY PROBLEMS (pain or burning when urinating, or frequent urination) *BOWEL PROBLEMS (unusual diarrhea, constipation, pain near the anus) TENDERNESS IN MOUTH AND THROAT WITH OR WITHOUT PRESENCE OF ULCERS (sore throat, sores in mouth, or a toothache) UNUSUAL RASH, SWELLING OR PAIN  UNUSUAL VAGINAL DISCHARGE OR ITCHING   Items with * indicate a potential emergency and should be followed up as soon as possible or go to the Emergency Department if any problems should occur.  Please show the CHEMOTHERAPY ALERT CARD or  IMMUNOTHERAPY ALERT CARD at check-in to the Emergency Department and triage nurse. Should you have questions after your visit or need to cancel or reschedule your appointment, please contact [facility] - A DEPT OF [facility]  [phone] and follow the prompts.  Office hours are 8:00 a.m. to 4:30 p.m. Monday - Friday. Please note that voicemails left after 4:00 p.m. may not be returned until the following business day.  We are closed weekends and major holidays. You have access to a nurse at all times for urgent questions. Please call the main number to the clinic [phone] and follow the prompts.  For any non-urgent questions, you may also contact your provider using MyChart. We now offer e-Visits for anyone 57 and older to request care online for non-urgent symptoms. For details visit [email or web address].   Also download the MyChart app! Go to the app store, search "MyChart", open the app, select [facility], and log in with your MyChart username and password.

## 2024-10-22 ENCOUNTER — Encounter: Payer: Self-pay | Admitting: Hematology & Oncology

## 2024-10-24 ENCOUNTER — Other Ambulatory Visit: Payer: Self-pay | Admitting: Hematology & Oncology

## 2024-10-28 ENCOUNTER — Inpatient Hospital Stay

## 2024-10-28 VITALS — BP 156/81 | HR 82 | Temp 97.6°F | Resp 18

## 2024-10-28 DIAGNOSIS — Z5112 Encounter for antineoplastic immunotherapy: Secondary | ICD-10-CM | POA: Diagnosis not present

## 2024-10-28 DIAGNOSIS — C9002 Multiple myeloma in relapse: Secondary | ICD-10-CM

## 2024-10-28 LAB — CBC WITH DIFFERENTIAL (CANCER CENTER ONLY)
Abs Immature Granulocytes: 0.01 K/uL (ref 0.00–0.07)
Basophils Absolute: 0.1 K/uL (ref 0.0–0.1)
Basophils Relative: 1 %
Eosinophils Absolute: 0.3 K/uL (ref 0.0–0.5)
Eosinophils Relative: 5 %
HCT: 39 % (ref 39.0–52.0)
Hemoglobin: 13 g/dL (ref 13.0–17.0)
Immature Granulocytes: 0 %
Lymphocytes Relative: 24 %
Lymphs Abs: 1.3 K/uL (ref 0.7–4.0)
MCH: 30.3 pg (ref 26.0–34.0)
MCHC: 33.3 g/dL (ref 30.0–36.0)
MCV: 90.9 fL (ref 80.0–100.0)
Monocytes Absolute: 0.9 K/uL (ref 0.1–1.0)
Monocytes Relative: 17 %
Neutro Abs: 2.8 K/uL (ref 1.7–7.7)
Neutrophils Relative %: 53 %
Platelet Count: 204 K/uL (ref 150–400)
RBC: 4.29 MIL/uL (ref 4.22–5.81)
RDW: 14.6 % (ref 11.5–15.5)
WBC Count: 5.4 K/uL (ref 4.0–10.5)
nRBC: 0 % (ref 0.0–0.2)

## 2024-10-28 LAB — CMP (CANCER CENTER ONLY)
ALT: 30 U/L (ref 0–44)
AST: 21 U/L (ref 15–41)
Albumin: 4.1 g/dL (ref 3.5–5.0)
Alkaline Phosphatase: 81 U/L (ref 38–126)
Anion gap: 11 (ref 5–15)
BUN: 28 mg/dL — ABNORMAL HIGH (ref 8–23)
CO2: 22 mmol/L (ref 22–32)
Calcium: 9.2 mg/dL (ref 8.9–10.3)
Chloride: 105 mmol/L (ref 98–111)
Creatinine: 1.6 mg/dL — ABNORMAL HIGH (ref 0.61–1.24)
GFR, Estimated: 44 mL/min — ABNORMAL LOW (ref >60)
Glucose, Bld: 188 mg/dL — ABNORMAL HIGH (ref 70–99)
Potassium: 4.9 mmol/L (ref 3.5–5.1)
Sodium: 137 mmol/L (ref 135–145)
Total Bilirubin: 0.2 mg/dL (ref 0.0–1.2)
Total Protein: 6.5 g/dL (ref 6.5–8.1)

## 2024-10-28 MED ORDER — PROCHLORPERAZINE MALEATE 10 MG PO TABS
10.0000 mg | ORAL_TABLET | Freq: Once | ORAL | Status: AC
  Administered 2024-10-28: 10 mg via ORAL
  Filled 2024-10-28: qty 1

## 2024-10-28 MED ORDER — BORTEZOMIB CHEMO SQ INJECTION 3.5 MG (2.5MG/ML)
1.3000 mg/m2 | Freq: Once | INTRAMUSCULAR | Status: AC
  Administered 2024-10-28: 2.75 mg via SUBCUTANEOUS
  Filled 2024-10-28: qty 1.1

## 2024-10-28 NOTE — Patient Instructions (Signed)
 Bortezomib  Injection What is this medication? BORTEZOMIB  (bor TEZ oh mib) treats lymphoma. It may also be used to treat multiple myeloma, a type of bone marrow cancer. It works by blocking a protein that causes cancer cells to grow and multiply. This helps to slow or stop the spread of cancer cells. This medicine may be used for other purposes; ask your health care provider or pharmacist if you have questions. COMMON BRAND NAME(S): BORUZU , Velcade  What should I tell my care team before I take this medication? They need to know if you have any of these conditions: Dehydration Diabetes Heart disease Liver disease Tingling of the fingers or toes or other nerve disorder An unusual or allergic reaction to bortezomib , other medications, foods, dyes, or preservatives If you or your partner are pregnant or trying to get pregnant Breastfeeding How should I use this medication? This medication is injected into a vein or under the skin. It is given by your care team in a hospital or clinic setting. Talk to your care team about the use of this medication in children. Special care may be needed. Overdosage: If you think you have taken too much of this medicine contact a poison control center or emergency room at once. NOTE: This medicine is only for you. Do not share this medicine with others. What if I miss a dose? Keep appointments for follow-up doses. It is important not to miss your dose. Call your care team if you are unable to keep an appointment. What may interact with this medication? Ketoconazole Rifampin This list may not describe all possible interactions. Give your health care provider a list of all the medicines, herbs, non-prescription drugs, or dietary supplements you use. Also tell them if you smoke, drink alcohol, or use illegal drugs. Some items may interact with your medicine. What should I watch for while using this medication? Your condition will be monitored carefully while you  are receiving this medication. You may need blood work while taking this medication. This medication may affect your coordination, reaction time, or judgment. Do not drive or operate machinery until you know how this medication affects you. Sit up or stand slowly to reduce the risk of dizzy or fainting spells. Drinking alcohol with this medication can increase the risk of these side effects. This medication may increase your risk of getting an infection. Call your care team for advice if you get a fever, chills, sore throat, or other symptoms of a cold or flu. Do not treat yourself. Try to avoid being around people who are sick. Check with your care team if you have severe diarrhea, nausea, and vomiting, or if you sweat a lot. The loss of too much body fluid may make it dangerous for you to take this medication. Talk to your care team if you may be pregnant. Serious birth defects can occur if you take this medication during pregnancy and for 7 months after the last dose. You will need a negative pregnancy test before starting this medication. Contraception is recommended while taking this medication and for 7 months after the last dose. Your care team can help you find the option that works for you. If your partner can get pregnant, use a condom during sex while taking this medication and for 4 months after the last dose. Do not breastfeed while taking this medication and for 2 months after the last dose. This medication may cause infertility. Talk to your care team if you are concerned about your fertility. What side  effects may I notice from receiving this medication? Side effects that you should report to your care team as soon as possible: Allergic reactions--skin rash, itching, hives, swelling of the face, lips, tongue, or throat Bleeding--bloody or black, tar-like stools, vomiting blood or brown material that looks like coffee grounds, red or dark brown urine, small red or purple spots on skin,  unusual bruising or bleeding Bleeding in the brain--severe headache, stiff neck, confusion, dizziness, change in vision, numbness or weakness of the face, arm, or leg, trouble speaking, trouble walking, vomiting Bowel blockage--stomach cramping, unable to have a bowel movement or pass gas, loss of appetite, vomiting Heart failure--shortness of breath, swelling of the ankles, feet, or hands, sudden weight gain, unusual weakness or fatigue Infection--fever, chills, cough, sore throat, wounds that don't heal, pain or trouble when passing urine, general feeling of discomfort or being unwell Liver injury--right upper belly pain, loss of appetite, nausea, light-colored stool, dark yellow or brown urine, yellowing skin or eyes, unusual weakness or fatigue Low blood pressure--dizziness, feeling faint or lightheaded, blurry vision Lung injury--shortness of breath or trouble breathing, cough, spitting up blood, chest pain, fever Pain, tingling, or numbness in the hands or feet Severe or prolonged diarrhea Stomach pain, bloody diarrhea, pale skin, unusual weakness or fatigue, decrease in the amount of urine, which may be signs of hemolytic uremic syndrome Sudden and severe headache, confusion, change in vision, seizures, which may be signs of posterior reversible encephalopathy syndrome (PRES) TTP--purple spots on the skin or inside the mouth, pale skin, yellowing skin or eyes, unusual weakness or fatigue, fever, fast or irregular heartbeat, confusion, change in vision, trouble speaking, trouble walking Tumor lysis syndrome (TLS)--nausea, vomiting, diarrhea, decrease in the amount of urine, dark urine, unusual weakness or fatigue, confusion, muscle pain or cramps, fast or irregular heartbeat, joint pain Side effects that usually do not require medical attention (report to your care team if they continue or are bothersome): Constipation Diarrhea Fatigue Loss of appetite Nausea This list may not describe all  possible side effects. Call your doctor for medical advice about side effects. You may report side effects to FDA at 1-800-FDA-1088. Where should I keep my medication? This medication is given in a hospital or clinic. It will not be stored at home. NOTE: This sheet is a summary. It may not cover all possible information. If you have questions about this medicine, talk to your doctor, pharmacist, or health care provider.  2024 Elsevier/Gold Standard (2022-05-09 00:00:00)

## 2024-11-04 ENCOUNTER — Inpatient Hospital Stay

## 2024-11-04 VITALS — BP 134/60 | HR 79 | Temp 97.7°F | Resp 18

## 2024-11-04 DIAGNOSIS — C9002 Multiple myeloma in relapse: Secondary | ICD-10-CM

## 2024-11-04 DIAGNOSIS — Z5112 Encounter for antineoplastic immunotherapy: Secondary | ICD-10-CM | POA: Diagnosis not present

## 2024-11-04 LAB — CMP (CANCER CENTER ONLY)
ALT: 26 U/L (ref 0–44)
AST: 18 U/L (ref 15–41)
Albumin: 4 g/dL (ref 3.5–5.0)
Alkaline Phosphatase: 82 U/L (ref 38–126)
Anion gap: 10 (ref 5–15)
BUN: 24 mg/dL — ABNORMAL HIGH (ref 8–23)
CO2: 23 mmol/L (ref 22–32)
Calcium: 9 mg/dL (ref 8.9–10.3)
Chloride: 103 mmol/L (ref 98–111)
Creatinine: 1.67 mg/dL — ABNORMAL HIGH (ref 0.61–1.24)
GFR, Estimated: 41 mL/min — ABNORMAL LOW (ref >60)
Glucose, Bld: 169 mg/dL — ABNORMAL HIGH (ref 70–99)
Potassium: 4.6 mmol/L (ref 3.5–5.1)
Sodium: 136 mmol/L (ref 135–145)
Total Bilirubin: 0.4 mg/dL (ref 0.0–1.2)
Total Protein: 6.1 g/dL — ABNORMAL LOW (ref 6.5–8.1)

## 2024-11-04 LAB — CBC WITH DIFFERENTIAL (CANCER CENTER ONLY)
Abs Immature Granulocytes: 0.02 K/uL (ref 0.00–0.07)
Basophils Absolute: 0 K/uL (ref 0.0–0.1)
Basophils Relative: 0 %
Eosinophils Absolute: 0.4 K/uL (ref 0.0–0.5)
Eosinophils Relative: 6 %
HCT: 37.8 % — ABNORMAL LOW (ref 39.0–52.0)
Hemoglobin: 12.8 g/dL — ABNORMAL LOW (ref 13.0–17.0)
Immature Granulocytes: 0 %
Lymphocytes Relative: 15 %
Lymphs Abs: 0.9 K/uL (ref 0.7–4.0)
MCH: 30.5 pg (ref 26.0–34.0)
MCHC: 33.9 g/dL (ref 30.0–36.0)
MCV: 90.2 fL (ref 80.0–100.0)
Monocytes Absolute: 0.7 K/uL (ref 0.1–1.0)
Monocytes Relative: 11 %
Neutro Abs: 4.2 K/uL (ref 1.7–7.7)
Neutrophils Relative %: 68 %
Platelet Count: 204 K/uL (ref 150–400)
RBC: 4.19 MIL/uL — ABNORMAL LOW (ref 4.22–5.81)
RDW: 14.6 % (ref 11.5–15.5)
WBC Count: 6.2 K/uL (ref 4.0–10.5)
nRBC: 0 % (ref 0.0–0.2)

## 2024-11-04 MED ORDER — BORTEZOMIB CHEMO SQ INJECTION 3.5 MG (2.5MG/ML)
1.3000 mg/m2 | Freq: Once | INTRAMUSCULAR | Status: AC
  Administered 2024-11-04: 2.75 mg via SUBCUTANEOUS
  Filled 2024-11-04: qty 1.1

## 2024-11-04 MED ORDER — PROCHLORPERAZINE MALEATE 10 MG PO TABS
10.0000 mg | ORAL_TABLET | Freq: Once | ORAL | Status: AC
  Administered 2024-11-04: 10 mg via ORAL
  Filled 2024-11-04: qty 1

## 2024-11-04 MED ORDER — PROCHLORPERAZINE MALEATE 10 MG PO TABS: 10.0000 mg | ORAL_TABLET | Freq: Once | ORAL | Status: DC

## 2024-11-04 NOTE — Progress Notes (Signed)
 Patient states he noticed a rash on his back, bilateral thighs and abdomen about 3 to 4 weeks ago. Patient states the rash is not itchy or painful, no blistering observed. However, a scabbed appearance is noted in the center. Lauraine Pepper, NP notified.   Lauraine Pepper, NP chairside. Ok to treat patient today.   Ok to treat with creatinine 1.67 per Lauraine Pepper, NP.

## 2024-11-06 ENCOUNTER — Other Ambulatory Visit: Payer: Self-pay

## 2024-11-11 ENCOUNTER — Inpatient Hospital Stay

## 2024-11-11 ENCOUNTER — Inpatient Hospital Stay: Admitting: Hematology & Oncology

## 2024-11-11 ENCOUNTER — Encounter: Payer: Self-pay | Admitting: Hematology & Oncology

## 2024-11-11 VITALS — BP 146/79 | HR 61 | Temp 97.5°F | Resp 18 | Ht 75.0 in | Wt 203.1 lb

## 2024-11-11 DIAGNOSIS — C9002 Multiple myeloma in relapse: Secondary | ICD-10-CM

## 2024-11-11 DIAGNOSIS — Z5112 Encounter for antineoplastic immunotherapy: Secondary | ICD-10-CM | POA: Diagnosis not present

## 2024-11-11 LAB — CBC WITH DIFFERENTIAL (CANCER CENTER ONLY)
Abs Immature Granulocytes: 0.01 K/uL (ref 0.00–0.07)
Basophils Absolute: 0.1 K/uL (ref 0.0–0.1)
Basophils Relative: 1 %
Eosinophils Absolute: 0.6 K/uL — ABNORMAL HIGH (ref 0.0–0.5)
Eosinophils Relative: 7 %
HCT: 42.4 % (ref 39.0–52.0)
Hemoglobin: 13.9 g/dL (ref 13.0–17.0)
Immature Granulocytes: 0 %
Lymphocytes Relative: 17 %
Lymphs Abs: 1.4 K/uL (ref 0.7–4.0)
MCH: 29.7 pg (ref 26.0–34.0)
MCHC: 32.8 g/dL (ref 30.0–36.0)
MCV: 90.6 fL (ref 80.0–100.0)
Monocytes Absolute: 0.8 K/uL (ref 0.1–1.0)
Monocytes Relative: 9 %
Neutro Abs: 5.2 K/uL (ref 1.7–7.7)
Neutrophils Relative %: 66 %
Platelet Count: 205 K/uL (ref 150–400)
RBC: 4.68 MIL/uL (ref 4.22–5.81)
RDW: 14.5 % (ref 11.5–15.5)
WBC Count: 8 K/uL (ref 4.0–10.5)
nRBC: 0 % (ref 0.0–0.2)

## 2024-11-11 LAB — CMP (CANCER CENTER ONLY)
ALT: 29 U/L (ref 0–44)
AST: 21 U/L (ref 15–41)
Albumin: 4.4 g/dL (ref 3.5–5.0)
Alkaline Phosphatase: 88 U/L (ref 38–126)
Anion gap: 12 (ref 5–15)
BUN: 25 mg/dL — ABNORMAL HIGH (ref 8–23)
CO2: 22 mmol/L (ref 22–32)
Calcium: 9.5 mg/dL (ref 8.9–10.3)
Chloride: 103 mmol/L (ref 98–111)
Creatinine: 1.78 mg/dL — ABNORMAL HIGH (ref 0.61–1.24)
GFR, Estimated: 38 mL/min — ABNORMAL LOW (ref >60)
Glucose, Bld: 206 mg/dL — ABNORMAL HIGH (ref 70–99)
Potassium: 4.4 mmol/L (ref 3.5–5.1)
Sodium: 137 mmol/L (ref 135–145)
Total Bilirubin: 0.4 mg/dL (ref 0.0–1.2)
Total Protein: 7 g/dL (ref 6.5–8.1)

## 2024-11-11 LAB — LACTATE DEHYDROGENASE: LDH: 169 U/L (ref 105–235)

## 2024-11-11 MED ORDER — DIPHENHYDRAMINE HCL 25 MG PO CAPS: 50.0000 mg | ORAL_CAPSULE | Freq: Once | ORAL | Status: DC

## 2024-11-11 MED ORDER — DEXAMETHASONE 4 MG PO TABS: 20.0000 mg | ORAL_TABLET | Freq: Once | ORAL | Status: DC

## 2024-11-11 MED ORDER — ACETAMINOPHEN 325 MG PO TABS: 650.0000 mg | ORAL_TABLET | Freq: Once | ORAL | Status: DC

## 2024-11-11 MED ORDER — DARATUMUMAB-HYALURONIDASE-FIHJ 1800-30000 MG-UT/15ML ~~LOC~~ SOLN
1800.0000 mg | Freq: Once | SUBCUTANEOUS | Status: AC
  Administered 2024-11-11: 1800 mg via SUBCUTANEOUS
  Filled 2024-11-11: qty 15

## 2024-11-11 NOTE — Progress Notes (Signed)
 Hematology and Oncology Follow Up Visit  Michael Giannini Sr. [id number] 07-01-1946 78 y.o. 11/11/2024   Principle Diagnosis:  IgG kappa smoldering myeloma - progressive Iron  deficiency anemia -- Venofer  last given on 03/2020  Current Therapy:   Faspro/Velcade /Revlimid /Dex -- s/p cycle #4 - start - on 07/29/2024 -Velcade  DC'd on 11/11/2024     Interim History:  [name] is back for follow up.  Overall, he is doing incredibly well.  His last myeloma studies were down quite nicely.  His monoclonal spike was 0.4 g/dL.  The IgG level was down to 1023 mg/dL.  The Kappa light chain was down to 2.3 mg/dL.  We went him to stop the movement on him.  I think had a little bit of a rash from the Revlimid .  He is on low-dose Revlimid .  I told him that he can try to be started the week right after Thanksgiving.  His renal function has been going up.  I really believe this is more so from his diabetes than from any myeloma protein.  He has had no problems with nausea or vomiting.  He has had no cough or shortness of breath.  He has had no fever.  He has had no bleeding.  There has been no leg swelling.  Overall, I will say that his performance status is probably ECOG 0.      Wt Readings from Last 3 Encounters:  11/11/24 203 lb 1.9 oz (92.1 kg)  10/21/24 206 lb 1.3 oz (93.5 kg)  09/30/24 204 lb (92.5 kg)   Medications:  Current Outpatient Medications:    Accu-Chek Softclix Lancets lancets, Use to check blood sugar once daily Dx code: E11.9, Disp: 100 each, Rfl: 1   acetaminophen  (TYLENOL ) 500 MG tablet, Take 1,000 mg by mouth., Disp: , Rfl:    albuterol  (VENTOLIN  HFA) 108 (90 Base) MCG/ACT inhaler, Inhale 1-2 puffs into the lungs every 6 (six) hours as needed for wheezing or shortness of breath., Disp: 8.5 each, Rfl: 6   aspirin  325 MG tablet, Take 1 tablet (325 mg total) by mouth daily., Disp: 100 tablet, Rfl: 3   atorvastatin  (LIPITOR) 20 MG tablet, Take 1 tablet (20 mg total) by mouth daily.,  Disp: 90 tablet, Rfl: 3   Blood Glucose Monitoring Suppl (ACCU-CHEK GUIDE ME) w/Device KIT, Check blood glucose once daily, Disp: 1 kit, Rfl: 0   dexamethasone  (DECADRON ) 4 MG tablet, Take 5 tabs (20 mg) weekly the day after daratumumab  for 12 weeks. Take with breakfast., Disp: 20 tablet, Rfl: 5   DHA-EPA-Vitamin E (OMEGA-3 COMPLEX) 192-251-11 MG-MG-UNIT CAPS, Take 1 capsule by mouth every other day., Disp: , Rfl:    empagliflozin  (JARDIANCE ) 25 MG TABS tablet, Take 1 tablet (25 mg total) by mouth daily before breakfast., Disp: 90 tablet, Rfl: 1   famciclovir  (FAMVIR ) 250 MG tablet, Take 1 tablet (250 mg total) by mouth 2 (two) times daily. Please start this 3 days prior to starting chemotherapy in August., Disp: 30 tablet, Rfl: 12   ferrous sulfate  325 (65 FE) MG tablet, Take 325 mg by mouth every other day., Disp: , Rfl:    finasteride (PROSCAR) 5 MG tablet, Take 5 mg by mouth daily., Disp: , Rfl:    Ginkgo Biloba 40 MG TABS, Take 1 tablet by mouth daily. , Disp: , Rfl:    glucosamine-chondroitin 500-400 MG tablet, Take 1 tablet by mouth daily., Disp: , Rfl:    glucose blood (ACCU-CHEK GUIDE TEST) test strip, Use to check blood sugar  once daily Dx code: E11.9, Disp: 100 each, Rfl: 12   latanoprost  (XALATAN ) 0.005 % ophthalmic solution, Place 1 drop into both eyes at bedtime. , Disp: , Rfl:    montelukast  (SINGULAIR ) 10 MG tablet, TAKE 1 TABLET 1 HOUR TO DARZALEX  (FASPRO), Disp: 90 tablet, Rfl: 3   Multiple Vitamin (MULTIVITAMIN WITH MINERALS) TABS, Take 1 tablet by mouth daily. , Disp: , Rfl:    ondansetron  (ZOFRAN ) 8 MG tablet, Take 1 tablet (8 mg total) by mouth every 8 (eight) hours as needed for nausea or vomiting., Disp: 30 tablet, Rfl: 1   prochlorperazine  (COMPAZINE ) 10 MG tablet, Take 1 tablet (10 mg total) by mouth every 6 (six) hours as needed for nausea or vomiting., Disp: 30 tablet, Rfl: 1   sacubitril -valsartan  (ENTRESTO ) 49-51 MG, Take 1 tablet by mouth 2 (two) times daily., Disp: 180  tablet, Rfl: 0   Semaglutide ,0.25 or 0.5MG /DOS, (OZEMPIC , 0.25 OR 0.5 MG/DOSE,) 2 MG/3ML SOPN, Inject 0.5 mg into the skin once a week., Disp: , Rfl:    sodium bicarbonate 650 MG tablet, Take 650 mg by mouth daily., Disp: , Rfl:    tamsulosin (FLOMAX) 0.4 MG CAPS capsule, Take 2 capsules (0.8 mg total) by mouth at bedtime., Disp: , Rfl:   Allergies:  Allergies  Allergen Reactions   Atorvastatin  Other (See Comments)     hard to focus   Rosuvastatin Other (See Comments)    unable to focus   Amlodipine  Swelling    Past Medical History, Surgical history, Social history, and Family History were reviewed and updated.  Review of Systems: Review of Systems  Constitutional: Negative.   HENT: Negative.    Eyes: Negative.   Respiratory: Negative.    Cardiovascular: Negative.   Gastrointestinal: Negative.   Genitourinary: Negative.   Musculoskeletal: Negative.   Skin: Negative.   Neurological: Negative.   Endo/Heme/Allergies: Negative.   Psychiatric/Behavioral: Negative.     Physical Exam: Vitals:   11/11/24 1045  BP: (!) 146/79  Pulse: 61  Resp: 18  Temp: (!) 97.5 F (36.4 C)  SpO2: 98%   His weight is 203 pounds.  Physical Exam Vitals reviewed.  HENT:     Head: Normocephalic and atraumatic.  Eyes:     Pupils: Pupils are equal, round, and reactive to light.  Cardiovascular:     Rate and Rhythm: Normal rate and regular rhythm.     Heart sounds: Normal heart sounds.  Pulmonary:     Effort: Pulmonary effort is normal.     Breath sounds: Normal breath sounds.  Abdominal:     General: Bowel sounds are normal.     Palpations: Abdomen is soft.  Musculoskeletal:        General: No tenderness or deformity. Normal range of motion.     Cervical back: Normal range of motion.  Lymphadenopathy:     Cervical: No cervical adenopathy.  Skin:    General: Skin is warm and dry.     Findings: No erythema or rash.  Neurological:     Mental Status: He is alert and oriented to  person, place, and time.  Psychiatric:        Behavior: Behavior normal.        Thought Content: Thought content normal.        Judgment: Judgment normal.      Lab Results  Component Value Date   WBC 8.0 11/11/2024   HGB 13.9 11/11/2024   HCT 42.4 11/11/2024   MCV 90.6 11/11/2024   PLT  205 11/11/2024     Chemistry      Component Value Date/Time   NA 137 11/11/2024 1020   NA 133 (L) 01/12/2020 1015   NA 142 10/03/2017 0910   NA 137 04/03/2017 0852   K 4.4 11/11/2024 1020   K 3.9 10/03/2017 0910   K 4.2 04/03/2017 0852   CL 103 11/11/2024 1020   CL 103 10/03/2017 0910   CL 108 (H) 05/15/2013 0912   CO2 22 11/11/2024 1020   CO2 28 10/03/2017 0910   CO2 26 04/03/2017 0852   BUN 25 (H) 11/11/2024 1020   BUN 25 01/12/2020 1015   BUN 13 10/03/2017 0910   BUN 16.3 04/03/2017 0852   CREATININE 1.78 (H) 11/11/2024 1020   CREATININE 1.07 11/16/2017 1437   CREATININE 1.0 04/03/2017 0852      Component Value Date/Time   CALCIUM  9.5 11/11/2024 1020   CALCIUM  9.3 10/03/2017 0910   CALCIUM  9.0 04/03/2017 0852   ALKPHOS 88 11/11/2024 1020   ALKPHOS 51 10/03/2017 0910   ALKPHOS 46 04/03/2017 0852   AST 21 11/11/2024 1020   AST 18 04/03/2017 0852   ALT 29 11/11/2024 1020   ALT 34 10/03/2017 0910   ALT 19 04/03/2017 0852   BILITOT 0.4 11/11/2024 1020   BILITOT 0.38 04/03/2017 0852      Impression and Plan: Mr. Clardy is 78 year old gentleman with IgG kappa smoldering myeloma. He has done really well on Faspro/Velcade /Revlimid /Dex treatment thus far.   Right now, he is on the Faspro and Revlimid .  We may have to consider stopping the Revlimid  if he does not have any improvement with the skin.  We will go ahead with his Faspro today.  We will see how his myeloma studies look.  I am just happy that he is done so well.  I think we kept his myeloma levels very low, he should never have any problems.  Will plan to get him back to see us  in another 3 weeks.    Maude JONELLE Crease, MD 11/25/202511:14 AM

## 2024-11-11 NOTE — Patient Instructions (Signed)
 [facility] - A DEPT OF [facility]  Discharge Instructions: Thank you for choosing [facility] to provide your oncology and hematology care.   If you have a lab appointment with the Cancer Center, please go directly to the Cancer Center and check in at the registration area.  Wear comfortable clothing and clothing appropriate for easy access to any Portacath or PICC line.   We strive to give you quality time with your provider. You may need to reschedule your appointment if you arrive late (15 or more minutes).  Arriving late affects you and other patients whose appointments are after yours.  Also, if you miss three or more appointments without notifying the office, you may be dismissed from the clinic at the provider's discretion.      For prescription refill requests, have your pharmacy contact our office and allow 72 hours for refills to be completed.    Today you received the following chemotherapy and/or immunotherapy agents:  Faspro      To help prevent nausea and vomiting after your treatment, we encourage you to take your nausea medication as directed.  BELOW ARE SYMPTOMS THAT SHOULD BE REPORTED IMMEDIATELY: *FEVER GREATER THAN 100.4 F (38 C) OR HIGHER *CHILLS OR SWEATING *NAUSEA AND VOMITING THAT IS NOT CONTROLLED WITH YOUR NAUSEA MEDICATION *UNUSUAL SHORTNESS OF BREATH *UNUSUAL BRUISING OR BLEEDING *URINARY PROBLEMS (pain or burning when urinating, or frequent urination) *BOWEL PROBLEMS (unusual diarrhea, constipation, pain near the anus) TENDERNESS IN MOUTH AND THROAT WITH OR WITHOUT PRESENCE OF ULCERS (sore throat, sores in mouth, or a toothache) UNUSUAL RASH, SWELLING OR PAIN  UNUSUAL VAGINAL DISCHARGE OR ITCHING   Items with * indicate a potential emergency and should be followed up as soon as possible or go to the Emergency Department if any problems should occur.  Please show the CHEMOTHERAPY ALERT CARD or IMMUNOTHERAPY  ALERT CARD at check-in to the Emergency Department and triage nurse. Should you have questions after your visit or need to cancel or reschedule your appointment, please contact [facility] - A DEPT OF [facility]  [phone] and follow the prompts.  Office hours are 8:00 a.m. to 4:30 p.m. Monday - Friday. Please note that voicemails left after 4:00 p.m. may not be returned until the following business day.  We are closed weekends and major holidays. You have access to a nurse at all times for urgent questions. Please call the main number to the clinic [phone] and follow the prompts.  For any non-urgent questions, you may also contact your provider using MyChart. We now offer e-Visits for anyone 66 and older to request care online for non-urgent symptoms. For details visit [email or web address].   Also download the MyChart app! Go to the app store, search "MyChart", open the app, select [place], and log in with your MyChart username and password.

## 2024-11-11 NOTE — Progress Notes (Signed)
 Per Dr. Timmy ok to treat with creatinine 1.78

## 2024-11-12 LAB — KAPPA/LAMBDA LIGHT CHAINS
Kappa free light chain: 25.7 mg/L — ABNORMAL HIGH (ref 3.3–19.4)
Kappa, lambda light chain ratio: 1.64 (ref 0.26–1.65)
Lambda free light chains: 15.7 mg/L (ref 5.7–26.3)

## 2024-11-12 LAB — IGG, IGA, IGM
IgA: 131 mg/dL (ref 61–437)
IgG (Immunoglobin G), Serum: 848 mg/dL (ref 603–1613)
IgM (Immunoglobulin M), Srm: 15 mg/dL (ref 15–143)

## 2024-11-14 ENCOUNTER — Encounter: Payer: Self-pay | Admitting: Hematology & Oncology

## 2024-11-14 DIAGNOSIS — C9002 Multiple myeloma in relapse: Secondary | ICD-10-CM

## 2024-11-17 ENCOUNTER — Other Ambulatory Visit: Payer: Self-pay | Admitting: *Deleted

## 2024-11-17 ENCOUNTER — Encounter: Payer: Self-pay | Admitting: Hematology & Oncology

## 2024-11-17 ENCOUNTER — Encounter: Payer: Self-pay | Admitting: Family

## 2024-11-17 DIAGNOSIS — C9002 Multiple myeloma in relapse: Secondary | ICD-10-CM

## 2024-11-17 MED ORDER — LENALIDOMIDE 10 MG PO CAPS: 10.0000 mg | ORAL_CAPSULE | Freq: Every day | ORAL | 0 refills | Status: AC

## 2024-11-17 MED ORDER — LENALIDOMIDE 10 MG PO CAPS: ORAL_CAPSULE | ORAL | 0 refills | Status: DC

## 2024-11-18 ENCOUNTER — Inpatient Hospital Stay

## 2024-11-18 ENCOUNTER — Ambulatory Visit: Payer: Self-pay | Admitting: Hematology & Oncology

## 2024-11-18 LAB — PROTEIN ELECTROPHORESIS, SERUM, WITH REFLEX
A/G Ratio: 1.2 (ref 0.7–1.7)
Albumin ELP: 3.6 g/dL (ref 2.9–4.4)
Alpha-1-Globulin: 0.2 g/dL (ref 0.0–0.4)
Alpha-2-Globulin: 0.9 g/dL (ref 0.4–1.0)
Beta Globulin: 1 g/dL (ref 0.7–1.3)
Gamma Globulin: 0.8 g/dL (ref 0.4–1.8)
Globulin, Total: 2.9 g/dL (ref 2.2–3.9)
M-Spike, %: 0.2 g/dL — ABNORMAL HIGH
SPEP Interpretation: 0
Total Protein ELP: 6.5 g/dL (ref 6.0–8.5)

## 2024-11-18 LAB — IMMUNOFIXATION REFLEX, SERUM
IgA: 152 mg/dL (ref 61–437)
IgG (Immunoglobin G), Serum: 905 mg/dL (ref 603–1613)
IgM (Immunoglobulin M), Srm: 16 mg/dL (ref 15–143)

## 2024-11-19 ENCOUNTER — Encounter: Payer: Self-pay | Admitting: Hematology & Oncology

## 2024-11-19 ENCOUNTER — Encounter: Payer: Self-pay | Admitting: Family

## 2024-11-19 NOTE — Telephone Encounter (Signed)
 Advised via MyChart.

## 2024-11-19 NOTE — Telephone Encounter (Signed)
-----   Message from Maude JONELLE Crease sent at 11/18/2024  7:04 PM EST ----- Call - the myeloma protein is now down to 0.2!!   Great job!!!   Jeralyn ----- Message ----- From: Rebecka, Lab In [place] Sent: 11/11/2024  10:31 AM EST To: Maude JONELLE Crease, MD

## 2024-11-20 ENCOUNTER — Telehealth: Payer: Self-pay

## 2024-11-20 DIAGNOSIS — E0829 Diabetes mellitus due to underlying condition with other diabetic kidney complication: Secondary | ICD-10-CM

## 2024-11-20 NOTE — Telephone Encounter (Signed)
 Patient was identified as falling into the [place] Measure - Diabetes.   Patient was: Appointment scheduled for lab or office visit for A1c.

## 2024-11-25 ENCOUNTER — Inpatient Hospital Stay

## 2024-11-25 ENCOUNTER — Telehealth: Payer: Self-pay

## 2024-11-25 NOTE — Telephone Encounter (Signed)
 Patient was identified as falling into the [place] Measure - Diabetes.   Patient was: Appointment already scheduled for:  11/27/2024.Michael Wiggins

## 2024-11-27 ENCOUNTER — Other Ambulatory Visit: Payer: Self-pay | Admitting: Pharmacist

## 2024-11-27 ENCOUNTER — Telehealth: Payer: Self-pay | Admitting: Pharmacist

## 2024-11-27 NOTE — Telephone Encounter (Signed)
 Attempt was made to contact patient by phone today for follow up by Clinical Pharmacist regarding diabetes / medication management.  Unable to reach patient. LM on VM with my contact number [phone].

## 2024-12-07 ENCOUNTER — Encounter: Payer: Self-pay | Admitting: Cardiology

## 2024-12-08 ENCOUNTER — Encounter: Payer: Self-pay | Admitting: Cardiology

## 2024-12-09 ENCOUNTER — Inpatient Hospital Stay: Attending: Hematology & Oncology

## 2024-12-09 ENCOUNTER — Ambulatory Visit: Payer: Self-pay | Admitting: Hematology & Oncology

## 2024-12-09 ENCOUNTER — Inpatient Hospital Stay

## 2024-12-09 ENCOUNTER — Encounter: Payer: Self-pay | Admitting: Hematology & Oncology

## 2024-12-09 ENCOUNTER — Other Ambulatory Visit: Payer: Self-pay

## 2024-12-09 ENCOUNTER — Inpatient Hospital Stay (HOSPITAL_BASED_OUTPATIENT_CLINIC_OR_DEPARTMENT_OTHER): Admitting: Hematology & Oncology

## 2024-12-09 VITALS — BP 128/67 | HR 67 | Temp 97.8°F | Resp 16 | Ht 75.0 in | Wt 206.0 lb

## 2024-12-09 DIAGNOSIS — Z79899 Other long term (current) drug therapy: Secondary | ICD-10-CM | POA: Insufficient documentation

## 2024-12-09 DIAGNOSIS — D509 Iron deficiency anemia, unspecified: Secondary | ICD-10-CM | POA: Diagnosis not present

## 2024-12-09 DIAGNOSIS — C9002 Multiple myeloma in relapse: Secondary | ICD-10-CM | POA: Diagnosis not present

## 2024-12-09 DIAGNOSIS — Z5112 Encounter for antineoplastic immunotherapy: Secondary | ICD-10-CM | POA: Diagnosis present

## 2024-12-09 LAB — CMP (CANCER CENTER ONLY)
ALT: 29 U/L (ref 0–44)
AST: 21 U/L (ref 15–41)
Albumin: 4.1 g/dL (ref 3.5–5.0)
Alkaline Phosphatase: 87 U/L (ref 38–126)
Anion gap: 12 (ref 5–15)
BUN: 31 mg/dL — ABNORMAL HIGH (ref 8–23)
CO2: 22 mmol/L (ref 22–32)
Calcium: 9 mg/dL (ref 8.9–10.3)
Chloride: 103 mmol/L (ref 98–111)
Creatinine: 1.84 mg/dL — ABNORMAL HIGH (ref 0.61–1.24)
GFR, Estimated: 37 mL/min — ABNORMAL LOW
Glucose, Bld: 163 mg/dL — ABNORMAL HIGH (ref 70–99)
Potassium: 4.5 mmol/L (ref 3.5–5.1)
Sodium: 136 mmol/L (ref 135–145)
Total Bilirubin: 0.3 mg/dL (ref 0.0–1.2)
Total Protein: 6.4 g/dL — ABNORMAL LOW (ref 6.5–8.1)

## 2024-12-09 LAB — LACTATE DEHYDROGENASE: LDH: 159 U/L (ref 105–235)

## 2024-12-09 LAB — CBC WITH DIFFERENTIAL (CANCER CENTER ONLY)
Abs Immature Granulocytes: 0.01 K/uL (ref 0.00–0.07)
Basophils Absolute: 0 K/uL (ref 0.0–0.1)
Basophils Relative: 1 %
Eosinophils Absolute: 0.4 K/uL (ref 0.0–0.5)
Eosinophils Relative: 12 %
HCT: 38.2 % — ABNORMAL LOW (ref 39.0–52.0)
Hemoglobin: 12.7 g/dL — ABNORMAL LOW (ref 13.0–17.0)
Immature Granulocytes: 0 %
Lymphocytes Relative: 18 %
Lymphs Abs: 0.6 K/uL — ABNORMAL LOW (ref 0.7–4.0)
MCH: 30.2 pg (ref 26.0–34.0)
MCHC: 33.2 g/dL (ref 30.0–36.0)
MCV: 90.7 fL (ref 80.0–100.0)
Monocytes Absolute: 0.4 K/uL (ref 0.1–1.0)
Monocytes Relative: 12 %
Neutro Abs: 2.1 K/uL (ref 1.7–7.7)
Neutrophils Relative %: 57 %
Platelet Count: 164 K/uL (ref 150–400)
RBC: 4.21 MIL/uL — ABNORMAL LOW (ref 4.22–5.81)
RDW: 14.6 % (ref 11.5–15.5)
WBC Count: 3.6 K/uL — ABNORMAL LOW (ref 4.0–10.5)
nRBC: 0 % (ref 0.0–0.2)

## 2024-12-09 LAB — IRON AND IRON BINDING CAPACITY (CC-WL,HP ONLY)
Iron: 56 ug/dL (ref 45–182)
Saturation Ratios: 15 % — ABNORMAL LOW (ref 17.9–39.5)
TIBC: 381 ug/dL (ref 250–450)
UIBC: 325 ug/dL

## 2024-12-09 LAB — FERRITIN: Ferritin: 53 ng/mL (ref 24–336)

## 2024-12-09 MED ORDER — DEXAMETHASONE 4 MG PO TABS: 20.0000 mg | ORAL_TABLET | Freq: Once | ORAL | Status: DC

## 2024-12-09 MED ORDER — ACETAMINOPHEN 325 MG PO TABS: 650.0000 mg | ORAL_TABLET | Freq: Once | ORAL | Status: DC

## 2024-12-09 MED ORDER — DARATUMUMAB-HYALURONIDASE-FIHJ 1800-30000 MG-UT/15ML ~~LOC~~ SOLN
1800.0000 mg | Freq: Once | SUBCUTANEOUS | Status: AC
  Administered 2024-12-09: 1800 mg via SUBCUTANEOUS
  Filled 2024-12-09: qty 15

## 2024-12-09 MED ORDER — DIPHENHYDRAMINE HCL 25 MG PO CAPS: 50.0000 mg | ORAL_CAPSULE | Freq: Once | ORAL | Status: DC

## 2024-12-09 NOTE — Progress Notes (Signed)
 Hematology and Oncology Follow Up Visit  Michael Wiggins. [id number] August 13, 1946 78 y.o. 12/09/2024   Principle Diagnosis:  IgG kappa smoldering myeloma - progressive Iron  deficiency anemia -- Venofer  last given on 03/2020  Current Therapy:   Faspro/Velcade /Revlimid /Dex -- s/p cycle #4 - start - on 07/29/2024 -Velcade  DC'd on 11/11/2024     Interim History:  [name] is back for follow up.  Overall, he is doing incredibly well.  His last myeloma studies were down quite nicely.  His monoclonal spike was 0.2 g/dL.  The IgG level was down to 860 mg/dL.  The Kappa light chain was down to 2.5 mg/dL.  His skin is doing a little bit better.  He is on Revlimid  2 weeks on and 2 weeks off.  This is I think a good compromise for him.  He has had no problems with bowels or bladder.  He has had no constipation.  He has had no diarrhea.  He did have a nice Thanksgiving.  He is good have a nice Christmas with his family.  He has had no fever.  He has had no cough or shortness of breath.  There has been no bleeding.  Overall, I will say that his performance status is probably ECOG 0.    Wt Readings from Last 3 Encounters:  12/09/24 206 lb (93.4 kg)  11/11/24 203 lb 1.9 oz (92.1 kg)  10/21/24 206 lb 1.3 oz (93.5 kg)   Medications:  Current Outpatient Medications:    Accu-Chek Softclix Lancets lancets, Use to check blood sugar once daily Dx code: E11.9, Disp: 100 each, Rfl: 1   acetaminophen  (TYLENOL ) 500 MG tablet, Take 1,000 mg by mouth., Disp: , Rfl:    albuterol  (VENTOLIN  HFA) 108 (90 Base) MCG/ACT inhaler, Inhale 1-2 puffs into the lungs every 6 (six) hours as needed for wheezing or shortness of breath., Disp: 8.5 each, Rfl: 6   aspirin  325 MG tablet, Take 1 tablet (325 mg total) by mouth daily., Disp: 100 tablet, Rfl: 3   atorvastatin  (LIPITOR) 20 MG tablet, Take 1 tablet (20 mg total) by mouth daily., Disp: 90 tablet, Rfl: 3   Blood Glucose Monitoring Suppl (ACCU-CHEK GUIDE ME)  w/Device KIT, Check blood glucose once daily, Disp: 1 kit, Rfl: 0   dexamethasone  (DECADRON ) 4 MG tablet, Take 5 tabs (20 mg) weekly the day after daratumumab  for 12 weeks. Take with breakfast., Disp: 20 tablet, Rfl: 5   DHA-EPA-Vitamin E (OMEGA-3 COMPLEX) 192-251-11 MG-MG-UNIT CAPS, Take 1 capsule by mouth every other day., Disp: , Rfl:    empagliflozin  (JARDIANCE ) 25 MG TABS tablet, Take 1 tablet (25 mg total) by mouth daily before breakfast., Disp: 90 tablet, Rfl: 1   famciclovir  (FAMVIR ) 250 MG tablet, Take 1 tablet (250 mg total) by mouth 2 (two) times daily. Please start this 3 days prior to starting chemotherapy in August., Disp: 30 tablet, Rfl: 12   ferrous sulfate  325 (65 FE) MG tablet, Take 325 mg by mouth every other day., Disp: , Rfl:    finasteride (PROSCAR) 5 MG tablet, Take 5 mg by mouth daily., Disp: , Rfl:    Ginkgo Biloba 40 MG TABS, Take 1 tablet by mouth daily. , Disp: , Rfl:    glucosamine-chondroitin 500-400 MG tablet, Take 1 tablet by mouth daily., Disp: , Rfl:    glucose blood (ACCU-CHEK GUIDE TEST) test strip, Use to check blood sugar once daily Dx code: E11.9, Disp: 100 each, Rfl: 12   latanoprost  (XALATAN ) 0.005 % ophthalmic  solution, Place 1 drop into both eyes at bedtime. , Disp: , Rfl:    lenalidomide  (REVLIMID ) 10 MG capsule, Take 1 capsule (10 mg total) by mouth daily for 28 days. Take 1 tablet by mouth daily x 14 days and then take 14 days off. [employer] Auth # [id number]   Date Obtained 11/17/24, Disp: 14 capsule, Rfl: 0   lenalidomide  (REVLIMID ) 10 MG capsule, Take one capsule by mouth for 14 days on, 14 days off. [employer] Auth # [id number]     Date Obtained 12.1.2025, Disp: 14 capsule, Rfl: 0   montelukast  (SINGULAIR ) 10 MG tablet, TAKE 1 TABLET 1 HOUR TO DARZALEX  (FASPRO), Disp: 90 tablet, Rfl: 3   Multiple Vitamin (MULTIVITAMIN WITH MINERALS) TABS, Take 1 tablet by mouth daily. , Disp: , Rfl:    ondansetron  (ZOFRAN ) 8 MG tablet, Take 1 tablet (8 mg total) by mouth every 8  (eight) hours as needed for nausea or vomiting., Disp: 30 tablet, Rfl: 1   prochlorperazine  (COMPAZINE ) 10 MG tablet, Take 1 tablet (10 mg total) by mouth every 6 (six) hours as needed for nausea or vomiting., Disp: 30 tablet, Rfl: 1   sacubitril -valsartan  (ENTRESTO ) 49-51 MG, Take 1 tablet by mouth 2 (two) times daily., Disp: 180 tablet, Rfl: 0   Semaglutide ,0.25 or 0.5MG /DOS, (OZEMPIC , 0.25 OR 0.5 MG/DOSE,) 2 MG/3ML SOPN, Inject 0.5 mg into the skin once a week., Disp: , Rfl:    sodium bicarbonate 650 MG tablet, Take 650 mg by mouth daily., Disp: , Rfl:    tamsulosin (FLOMAX) 0.4 MG CAPS capsule, Take 2 capsules (0.8 mg total) by mouth at bedtime., Disp: , Rfl:   Allergies:  Allergies  Allergen Reactions   Atorvastatin  Other (See Comments)     hard to focus   Rosuvastatin Other (See Comments)    unable to focus   Amlodipine  Swelling    Past Medical History, Surgical history, Social history, and Family History were reviewed and updated.  Review of Systems: Review of Systems  Constitutional: Negative.   HENT: Negative.    Eyes: Negative.   Respiratory: Negative.    Cardiovascular: Negative.   Gastrointestinal: Negative.   Genitourinary: Negative.   Musculoskeletal: Negative.   Skin: Negative.   Neurological: Negative.   Endo/Heme/Allergies: Negative.   Psychiatric/Behavioral: Negative.     Physical Exam: Vitals:   12/09/24 1000  BP: 128/67  Pulse: 67  Resp: 16  Temp: 97.8 F (36.6 C)  SpO2: 100%   His weight is 203 pounds.  Physical Exam Vitals reviewed.  HENT:     Head: Normocephalic and atraumatic.  Eyes:     Pupils: Pupils are equal, round, and reactive to light.  Cardiovascular:     Rate and Rhythm: Normal rate and regular rhythm.     Heart sounds: Normal heart sounds.  Pulmonary:     Effort: Pulmonary effort is normal.     Breath sounds: Normal breath sounds.  Abdominal:     General: Bowel sounds are normal.     Palpations: Abdomen is soft.   Musculoskeletal:        General: No tenderness or deformity. Normal range of motion.     Cervical back: Normal range of motion.  Lymphadenopathy:     Cervical: No cervical adenopathy.  Skin:    General: Skin is warm and dry.     Findings: No erythema or rash.  Neurological:     Mental Status: He is alert and oriented to person, place, and time.  Psychiatric:  Behavior: Behavior normal.        Thought Content: Thought content normal.        Judgment: Judgment normal.      Lab Results  Component Value Date   WBC 3.6 (L) 12/09/2024   HGB 12.7 (L) 12/09/2024   HCT 38.2 (L) 12/09/2024   MCV 90.7 12/09/2024   PLT 164 12/09/2024     Chemistry      Component Value Date/Time   NA 136 12/09/2024 1026   NA 133 (L) 01/12/2020 1015   NA 142 10/03/2017 0910   NA 137 04/03/2017 0852   K 4.5 12/09/2024 1026   K 3.9 10/03/2017 0910   K 4.2 04/03/2017 0852   CL 103 12/09/2024 1026   CL 103 10/03/2017 0910   CL 108 (H) 05/15/2013 0912   CO2 22 12/09/2024 1026   CO2 28 10/03/2017 0910   CO2 26 04/03/2017 0852   BUN 31 (H) 12/09/2024 1026   BUN 25 01/12/2020 1015   BUN 13 10/03/2017 0910   BUN 16.3 04/03/2017 0852   CREATININE 1.84 (H) 12/09/2024 1026   CREATININE 1.07 11/16/2017 1437   CREATININE 1.0 04/03/2017 0852      Component Value Date/Time   CALCIUM  9.0 12/09/2024 1026   CALCIUM  9.3 10/03/2017 0910   CALCIUM  9.0 04/03/2017 0852   ALKPHOS 87 12/09/2024 1026   ALKPHOS 51 10/03/2017 0910   ALKPHOS 46 04/03/2017 0852   AST 21 12/09/2024 1026   AST 18 04/03/2017 0852   ALT 29 12/09/2024 1026   ALT 34 10/03/2017 0910   ALT 19 04/03/2017 0852   BILITOT 0.3 12/09/2024 1026   BILITOT 0.38 04/03/2017 0852      Impression and Plan: Michael Wiggins is 78 year old gentleman with IgG kappa smoldering myeloma. He has done really well on Faspro/Velcade /Revlimid /Dex treatment thus far.   So far, everything is going quite well.  We will see what his myeloma levels look  like.  I am just happy that he is done so well.  His myeloma has responded as expected.  At some point, we may want to think about doing another bone marrow test on him to see how everything looks.  We will plan to get him back in another month.  I am sure that you will have a wonderful Christmas and New Year's.  Maude JONELLE Crease, MD 12/23/202511:41 AM

## 2024-12-09 NOTE — Progress Notes (Signed)
 OK to treat with creat-1.84 per order of Dr. Timmy.

## 2024-12-09 NOTE — Patient Instructions (Signed)
 [facility] - A DEPT OF [facility]  Discharge Instructions: Thank you for choosing [facility] to provide your oncology and hematology care.   If you have a lab appointment with the Cancer Center, please go directly to the Cancer Center and check in at the registration area.  Wear comfortable clothing and clothing appropriate for easy access to any Portacath or PICC line.   We strive to give you quality time with your provider. You may need to reschedule your appointment if you arrive late (15 or more minutes).  Arriving late affects you and other patients whose appointments are after yours.  Also, if you miss three or more appointments without notifying the office, you may be dismissed from the clinic at the provider's discretion.      For prescription refill requests, have your pharmacy contact our office and allow 72 hours for refills to be completed.    Today you received the following chemotherapy and/or immunotherapy agents:  Faspro      To help prevent nausea and vomiting after your treatment, we encourage you to take your nausea medication as directed.  BELOW ARE SYMPTOMS THAT SHOULD BE REPORTED IMMEDIATELY: *FEVER GREATER THAN 100.4 F (38 C) OR HIGHER *CHILLS OR SWEATING *NAUSEA AND VOMITING THAT IS NOT CONTROLLED WITH YOUR NAUSEA MEDICATION *UNUSUAL SHORTNESS OF BREATH *UNUSUAL BRUISING OR BLEEDING *URINARY PROBLEMS (pain or burning when urinating, or frequent urination) *BOWEL PROBLEMS (unusual diarrhea, constipation, pain near the anus) TENDERNESS IN MOUTH AND THROAT WITH OR WITHOUT PRESENCE OF ULCERS (sore throat, sores in mouth, or a toothache) UNUSUAL RASH, SWELLING OR PAIN  UNUSUAL VAGINAL DISCHARGE OR ITCHING   Items with * indicate a potential emergency and should be followed up as soon as possible or go to the Emergency Department if any problems should occur.  Please show the CHEMOTHERAPY ALERT CARD or IMMUNOTHERAPY  ALERT CARD at check-in to the Emergency Department and triage nurse. Should you have questions after your visit or need to cancel or reschedule your appointment, please contact [facility] - A DEPT OF [facility]  [phone] and follow the prompts.  Office hours are 8:00 a.m. to 4:30 p.m. Monday - Friday. Please note that voicemails left after 4:00 p.m. may not be returned until the following business day.  We are closed weekends and major holidays. You have access to a nurse at all times for urgent questions. Please call the main number to the clinic [phone] and follow the prompts.  For any non-urgent questions, you may also contact your provider using MyChart. We now offer e-Visits for anyone 66 and older to request care online for non-urgent symptoms. For details visit [email or web address].   Also download the MyChart app! Go to the app store, search "MyChart", open the app, select [place], and log in with your MyChart username and password.

## 2024-12-10 ENCOUNTER — Encounter: Payer: Self-pay | Admitting: Family

## 2024-12-10 ENCOUNTER — Other Ambulatory Visit: Payer: Self-pay

## 2024-12-10 ENCOUNTER — Encounter: Payer: Self-pay | Admitting: *Deleted

## 2024-12-10 LAB — ERYTHROPOIETIN: Erythropoietin: 14.7 m[IU]/mL (ref 2.6–18.5)

## 2024-12-10 LAB — IGG, IGA, IGM
IgA: 120 mg/dL (ref 61–437)
IgG (Immunoglobin G), Serum: 700 mg/dL (ref 603–1613)
IgM (Immunoglobulin M), Srm: 12 mg/dL — ABNORMAL LOW (ref 15–143)

## 2024-12-10 LAB — KAPPA/LAMBDA LIGHT CHAINS
Kappa free light chain: 25.4 mg/L — ABNORMAL HIGH (ref 3.3–19.4)
Kappa, lambda light chain ratio: 1.74 — ABNORMAL HIGH (ref 0.26–1.65)
Lambda free light chains: 14.6 mg/L (ref 5.7–26.3)

## 2024-12-16 ENCOUNTER — Encounter: Payer: Self-pay | Admitting: Family Medicine

## 2024-12-16 ENCOUNTER — Other Ambulatory Visit (INDEPENDENT_AMBULATORY_CARE_PROVIDER_SITE_OTHER)

## 2024-12-16 ENCOUNTER — Other Ambulatory Visit: Payer: Self-pay

## 2024-12-16 DIAGNOSIS — C9000 Multiple myeloma not having achieved remission: Secondary | ICD-10-CM

## 2024-12-16 DIAGNOSIS — E0829 Diabetes mellitus due to underlying condition with other diabetic kidney complication: Secondary | ICD-10-CM

## 2024-12-16 DIAGNOSIS — C9002 Multiple myeloma in relapse: Secondary | ICD-10-CM

## 2024-12-16 DIAGNOSIS — R972 Elevated prostate specific antigen [PSA]: Secondary | ICD-10-CM

## 2024-12-16 LAB — PROTEIN ELECTROPHORESIS, SERUM, WITH REFLEX
A/G Ratio: 1.6 (ref 0.7–1.7)
Albumin ELP: 3.7 g/dL (ref 2.9–4.4)
Alpha-1-Globulin: 0.2 g/dL (ref 0.0–0.4)
Alpha-2-Globulin: 0.7 g/dL (ref 0.4–1.0)
Beta Globulin: 0.8 g/dL (ref 0.7–1.3)
Gamma Globulin: 0.6 g/dL (ref 0.4–1.8)
Globulin, Total: 2.3 g/dL (ref 2.2–3.9)
SPEP Interpretation: 0
Total Protein ELP: 6 g/dL (ref 6.0–8.5)

## 2024-12-16 LAB — IMMUNOFIXATION REFLEX, SERUM
IgA: 120 mg/dL (ref 61–437)
IgG (Immunoglobin G), Serum: 727 mg/dL (ref 603–1613)
IgM (Immunoglobulin M), Srm: 15 mg/dL (ref 15–143)

## 2024-12-16 LAB — HEMOGLOBIN A1C: Hgb A1c MFr Bld: 8.7 % — ABNORMAL HIGH (ref 4.6–6.5)

## 2024-12-16 MED ORDER — SEMAGLUTIDE (1 MG/DOSE) 4 MG/3ML ~~LOC~~ SOPN: 1.0000 mg | PEN_INJECTOR | SUBCUTANEOUS | 2 refills | Status: AC

## 2024-12-16 MED ORDER — LENALIDOMIDE 10 MG PO CAPS: ORAL_CAPSULE | ORAL | 0 refills | Status: DC

## 2024-12-16 NOTE — Telephone Encounter (Signed)
 [facility] called in stating that they need the Revlimid  prescription to say daily in the sig to process the refill. ERX changed and sent.

## 2024-12-17 ENCOUNTER — Encounter: Payer: Self-pay | Admitting: Hematology & Oncology

## 2024-12-17 ENCOUNTER — Encounter: Payer: Self-pay | Admitting: Family

## 2024-12-17 NOTE — Telephone Encounter (Signed)
 Advised via MyChart.

## 2024-12-17 NOTE — Telephone Encounter (Signed)
-----   Message from Maude Crease, MD sent at 12/16/2024  4:49 PM EST ----- Call - let him know that the myeloma protein in the blood is gone!!  Great way to end the year!!  [address]

## 2024-12-22 ENCOUNTER — Inpatient Hospital Stay: Attending: Hematology & Oncology

## 2024-12-22 VITALS — BP 151/87 | HR 67 | Temp 97.7°F | Resp 18

## 2024-12-22 DIAGNOSIS — D509 Iron deficiency anemia, unspecified: Secondary | ICD-10-CM | POA: Diagnosis not present

## 2024-12-22 DIAGNOSIS — C9002 Multiple myeloma in relapse: Secondary | ICD-10-CM | POA: Diagnosis present

## 2024-12-22 DIAGNOSIS — Z79899 Other long term (current) drug therapy: Secondary | ICD-10-CM | POA: Diagnosis not present

## 2024-12-22 DIAGNOSIS — D500 Iron deficiency anemia secondary to blood loss (chronic): Secondary | ICD-10-CM

## 2024-12-22 DIAGNOSIS — Z5112 Encounter for antineoplastic immunotherapy: Secondary | ICD-10-CM | POA: Insufficient documentation

## 2024-12-22 MED ORDER — SODIUM CHLORIDE 0.9 % IV SOLN: INTRAVENOUS | Status: DC

## 2024-12-22 MED ORDER — IRON SUCROSE 300 MG IVPB - SIMPLE MED
300.0000 mg | Freq: Once | Status: AC
  Administered 2024-12-22: 300 mg via INTRAVENOUS
  Filled 2024-12-22: qty 300

## 2024-12-22 NOTE — Patient Instructions (Signed)

## 2024-12-25 ENCOUNTER — Other Ambulatory Visit: Payer: Self-pay | Admitting: Hematology & Oncology

## 2024-12-29 ENCOUNTER — Inpatient Hospital Stay

## 2024-12-29 VITALS — BP 141/82 | HR 62 | Temp 97.6°F | Resp 18

## 2024-12-29 DIAGNOSIS — Z5112 Encounter for antineoplastic immunotherapy: Secondary | ICD-10-CM | POA: Diagnosis not present

## 2024-12-29 DIAGNOSIS — D500 Iron deficiency anemia secondary to blood loss (chronic): Secondary | ICD-10-CM

## 2024-12-29 MED ORDER — IRON SUCROSE 300 MG IVPB - SIMPLE MED
300.0000 mg | Freq: Once | Status: AC
  Administered 2024-12-29: 300 mg via INTRAVENOUS
  Filled 2024-12-29: qty 300

## 2024-12-29 MED ORDER — SODIUM CHLORIDE 0.9 % IV SOLN: INTRAVENOUS | Status: DC

## 2024-12-29 NOTE — Patient Instructions (Signed)

## 2024-12-29 NOTE — Progress Notes (Signed)
 Pt declined to stay for post infusion observation period. Pt stated he has tolerated medication multiple times prior without difficulty. Pt aware to call clinic with any questions or concerns. Pt verbalized understanding and had no further questions.

## 2024-12-31 NOTE — Addendum Note (Signed)
 Addended by: WATT RAISIN C on: 12/31/2024 12:11 PM   Modules accepted: Orders

## 2025-01-01 ENCOUNTER — Ambulatory Visit: Attending: Cardiology

## 2025-01-01 ENCOUNTER — Encounter: Payer: Self-pay | Admitting: Pharmacist

## 2025-01-01 ENCOUNTER — Telehealth: Payer: Self-pay | Admitting: Pharmacist

## 2025-01-01 DIAGNOSIS — I441 Atrioventricular block, second degree: Secondary | ICD-10-CM

## 2025-01-01 NOTE — Telephone Encounter (Signed)
 Called [facility] and provided [employer] information to them - should be active thru July 2026.  Pharmacy was able to get grant to cover medication cost.  Patient notified thru MyChart message.

## 2025-01-02 ENCOUNTER — Ambulatory Visit: Payer: Self-pay | Admitting: Cardiology

## 2025-01-02 LAB — CUP PACEART REMOTE DEVICE CHECK
Battery Remaining Longevity: 12 mo
Battery Remaining Percentage: 14 %
Battery Voltage: 2.87 V
Brady Statistic AP VP Percent: 22 %
Brady Statistic AP VS Percent: 1 %
Brady Statistic AS VP Percent: 72 %
Brady Statistic AS VS Percent: 3.1 %
Brady Statistic RA Percent Paced: 20 %
Date Time Interrogation Session: 20260115045544
Implantable Lead Connection Status: 753985
Implantable Lead Connection Status: 753985
Implantable Lead Connection Status: 753985
Implantable Lead Implant Date: 20160822
Implantable Lead Implant Date: 20160822
Implantable Lead Implant Date: 20190118
Implantable Lead Location: 753858
Implantable Lead Location: 753859
Implantable Lead Location: 753860
Implantable Pulse Generator Implant Date: 20190118
Lead Channel Impedance Value: 1175 Ohm
Lead Channel Impedance Value: 390 Ohm
Lead Channel Impedance Value: 510 Ohm
Lead Channel Pacing Threshold Amplitude: 0.5 V
Lead Channel Pacing Threshold Amplitude: 1 V
Lead Channel Pacing Threshold Amplitude: 1.75 V
Lead Channel Pacing Threshold Pulse Width: 0.5 ms
Lead Channel Pacing Threshold Pulse Width: 0.5 ms
Lead Channel Pacing Threshold Pulse Width: 0.5 ms
Lead Channel Sensing Intrinsic Amplitude: 1.3 mV
Lead Channel Sensing Intrinsic Amplitude: 12 mV
Lead Channel Setting Pacing Amplitude: 1.5 V
Lead Channel Setting Pacing Amplitude: 2 V
Lead Channel Setting Pacing Amplitude: 2.75 V
Lead Channel Setting Pacing Pulse Width: 0.5 ms
Lead Channel Setting Pacing Pulse Width: 0.5 ms
Lead Channel Setting Sensing Sensitivity: 4 mV
Pulse Gen Model: 3262
Pulse Gen Serial Number: 8983138

## 2025-01-06 ENCOUNTER — Inpatient Hospital Stay: Admitting: Hematology & Oncology

## 2025-01-06 ENCOUNTER — Inpatient Hospital Stay

## 2025-01-06 ENCOUNTER — Inpatient Hospital Stay: Admitting: Family

## 2025-01-06 ENCOUNTER — Encounter: Payer: Self-pay | Admitting: Family Medicine

## 2025-01-06 VITALS — BP 127/73 | HR 73 | Resp 18

## 2025-01-06 VITALS — BP 143/79 | HR 61 | Temp 97.3°F | Resp 17 | Ht 75.0 in | Wt 202.0 lb

## 2025-01-06 DIAGNOSIS — C9002 Multiple myeloma in relapse: Secondary | ICD-10-CM | POA: Diagnosis not present

## 2025-01-06 DIAGNOSIS — Z5112 Encounter for antineoplastic immunotherapy: Secondary | ICD-10-CM | POA: Diagnosis not present

## 2025-01-06 LAB — CMP (CANCER CENTER ONLY)
ALT: 42 U/L (ref 0–44)
AST: 28 U/L (ref 15–41)
Albumin: 4.2 g/dL (ref 3.5–5.0)
Alkaline Phosphatase: 85 U/L (ref 38–126)
Anion gap: 11 (ref 5–15)
BUN: 25 mg/dL — ABNORMAL HIGH (ref 8–23)
CO2: 22 mmol/L (ref 22–32)
Calcium: 9.2 mg/dL (ref 8.9–10.3)
Chloride: 104 mmol/L (ref 98–111)
Creatinine: 1.61 mg/dL — ABNORMAL HIGH (ref 0.61–1.24)
GFR, Estimated: 43 mL/min — ABNORMAL LOW
Glucose, Bld: 143 mg/dL — ABNORMAL HIGH (ref 70–99)
Potassium: 4.7 mmol/L (ref 3.5–5.1)
Sodium: 137 mmol/L (ref 135–145)
Total Bilirubin: 0.4 mg/dL (ref 0.0–1.2)
Total Protein: 6.5 g/dL (ref 6.5–8.1)

## 2025-01-06 LAB — CBC WITH DIFFERENTIAL (CANCER CENTER ONLY)
Abs Immature Granulocytes: 0.01 K/uL (ref 0.00–0.07)
Basophils Absolute: 0 K/uL (ref 0.0–0.1)
Basophils Relative: 1 %
Eosinophils Absolute: 0.4 K/uL (ref 0.0–0.5)
Eosinophils Relative: 10 %
HCT: 39.8 % (ref 39.0–52.0)
Hemoglobin: 13.1 g/dL (ref 13.0–17.0)
Immature Granulocytes: 0 %
Lymphocytes Relative: 19 %
Lymphs Abs: 0.8 K/uL (ref 0.7–4.0)
MCH: 30.1 pg (ref 26.0–34.0)
MCHC: 32.9 g/dL (ref 30.0–36.0)
MCV: 91.5 fL (ref 80.0–100.0)
Monocytes Absolute: 0.4 K/uL (ref 0.1–1.0)
Monocytes Relative: 10 %
Neutro Abs: 2.6 K/uL (ref 1.7–7.7)
Neutrophils Relative %: 60 %
Platelet Count: 184 K/uL (ref 150–400)
RBC: 4.35 MIL/uL (ref 4.22–5.81)
RDW: 15 % (ref 11.5–15.5)
WBC Count: 4.2 K/uL (ref 4.0–10.5)
nRBC: 0 % (ref 0.0–0.2)

## 2025-01-06 LAB — LACTATE DEHYDROGENASE: LDH: 157 U/L (ref 105–235)

## 2025-01-06 MED ORDER — ACETAMINOPHEN 325 MG PO TABS: 650.0000 mg | ORAL_TABLET | Freq: Once | ORAL | Status: DC

## 2025-01-06 MED ORDER — DARATUMUMAB-HYALURONIDASE-FIHJ 1800-30000 MG-UT/15ML ~~LOC~~ SOLN
1800.0000 mg | Freq: Once | SUBCUTANEOUS | Status: AC
  Administered 2025-01-06: 1800 mg via SUBCUTANEOUS
  Filled 2025-01-06: qty 15

## 2025-01-06 MED ORDER — DEXAMETHASONE 4 MG PO TABS: 20.0000 mg | ORAL_TABLET | Freq: Once | ORAL | Status: DC

## 2025-01-06 MED ORDER — DIPHENHYDRAMINE HCL 25 MG PO CAPS: 50.0000 mg | ORAL_CAPSULE | Freq: Once | ORAL | Status: DC

## 2025-01-06 NOTE — Progress Notes (Signed)
 " Hematology and Oncology Follow Up Visit  Michael Willers Sr. [id number] [birth year] 79 y.o. 01/06/2025   Principle Diagnosis:  IgG kappa smoldering myeloma - progressive Iron  deficiency anemia    Current Therapy:        Faspro/Velcade /Revlimid /Dex -- s/p cycle #4 - start - on 07/29/2024 - Velcade  only DC'd on 11/11/2024 IV iron  as indicated   Interim History:  Michael Wiggins is here today for follow-up and treatment. He is doing quite well and has no complaints at this time.  No M-spike observed at his last follow-up. IgG level was 700 mg/dL and kappa light chains 2.54 mg/dL.  He has had a good appetite and is staying well hydrated. He has had some occasional diarrhea vs constipation with Ozempic  but has managed this effectively with over the counter medication such as MIralax.  No fever, chills, n/v, cough, rash, dizziness, chest pain, SOB, abdominal pain or changes in bowel or bladder habits.  No swelling in his extremities.  Neuropathy in his lower extremities improves with movement.  He enjoys exercising and lifting weights every other day.  Weight is stable at 202 lbs.   ECOG Performance Status: 1 - Symptomatic but completely ambulatory  Medications:  Allergies as of 01/06/2025       Reactions   Atorvastatin  Other (See Comments)    hard to focus   Rosuvastatin Other (See Comments)   unable to focus   Amlodipine  Swelling        Medication List        Accurate as of January 06, 2025 11:17 AM. If you have any questions, ask your nurse or doctor.          Accu-Chek Guide Me w/Device Kit Check blood glucose once daily   Accu-Chek Guide Test test strip Generic drug: glucose blood Use to check blood sugar once daily Dx code: E11.9   Accu-Chek Softclix Lancets lancets Use to check blood sugar once daily Dx code: E11.9   acetaminophen  500 MG tablet Commonly known as: TYLENOL  Take 1,000 mg by mouth.   albuterol  108 (90 Base) MCG/ACT inhaler Commonly known as:  VENTOLIN  HFA Inhale 1-2 puffs into the lungs every 6 (six) hours as needed for wheezing or shortness of breath.   aspirin  325 MG tablet Take 1 tablet (325 mg total) by mouth daily.   atorvastatin  20 MG tablet Commonly known as: LIPITOR Take 1 tablet (20 mg total) by mouth daily.   dexamethasone  4 MG tablet Commonly known as: DECADRON  Take 5 tabs (20 mg) weekly the day after daratumumab  for 12 weeks. Take with breakfast.   empagliflozin  25 MG Tabs tablet Commonly known as: Jardiance  Take 1 tablet (25 mg total) by mouth daily before breakfast.   famciclovir  250 MG tablet Commonly known as: FAMVIR  TAKE 1 TABLET 2 TIMES DAILY. PLEASE START THIS 3 DAYS PRIOR TO STARTING CHEMOTHERAPY IN AUGUST.   ferrous sulfate  325 (65 FE) MG tablet Take 325 mg by mouth every other day.   finasteride 5 MG tablet Commonly known as: PROSCAR Take 5 mg by mouth daily.   Ginkgo Biloba 40 MG Tabs Take 1 tablet by mouth daily.   glucosamine-chondroitin 500-400 MG tablet Take 1 tablet by mouth daily.   latanoprost  0.005 % ophthalmic solution Commonly known as: XALATAN  Place 1 drop into both eyes at bedtime.   lenalidomide  10 MG capsule Commonly known as: REVLIMID  Take one capsule by mouth daily for 14 days on, 14 days off. [employer] Auth # [id number]   Date Obtained  12.30.2025   montelukast  10 MG tablet Commonly known as: SINGULAIR  TAKE 1 TABLET 1 HOUR TO DARZALEX  (FASPRO)   multivitamin with minerals Tabs tablet Take 1 tablet by mouth daily.   Omega-3 Complex 192-251-11 MG-MG-UNIT Caps Generic drug: DHA-EPA-Vitamin E Take 1 capsule by mouth every other day.   ondansetron  8 MG tablet Commonly known as: Zofran  Take 1 tablet (8 mg total) by mouth every 8 (eight) hours as needed for nausea or vomiting.   prochlorperazine  10 MG tablet Commonly known as: COMPAZINE  Take 1 tablet (10 mg total) by mouth every 6 (six) hours as needed for nausea or vomiting.   sacubitril -valsartan  49-51  MG Commonly known as: Entresto  Take 1 tablet by mouth 2 (two) times daily.   Semaglutide  (1 MG/DOSE) 4 MG/3ML Sopn Inject 1 mg as directed once a week.   sodium bicarbonate 650 MG tablet Take 650 mg by mouth daily.   tamsulosin 0.4 MG Caps capsule Commonly known as: FLOMAX Take 2 capsules (0.8 mg total) by mouth at bedtime.        Allergies: Allergies[1]  Past Medical History, Surgical history, Social history, and Family History were reviewed and updated.  Review of Systems: All other 10 point review of systems is negative.   Physical Exam:  height is 6' 3 (1.905 m) and weight is 202 lb 0.6 oz (91.6 kg). His oral temperature is 97.3 F (36.3 C) (abnormal). His blood pressure is 143/79 (abnormal) and his pulse is 61. His respiration is 17 and oxygen saturation is 98%.   Wt Readings from Last 3 Encounters:  01/06/25 202 lb 0.6 oz (91.6 kg)  12/09/24 206 lb (93.4 kg)  11/11/24 203 lb 1.9 oz (92.1 kg)    Ocular: Sclerae unicteric, pupils equal, round and reactive to light Ear-nose-throat: Oropharynx clear, dentition fair Lymphatic: No cervical or supraclavicular adenopathy Lungs no rales or rhonchi, good excursion bilaterally Heart regular rate and rhythm, no murmur appreciated Abd soft, nontender, positive bowel sounds MSK no focal spinal tenderness, no joint edema Neuro: non-focal, well-oriented, appropriate affect Breasts: Deferred   Lab Results  Component Value Date   WBC 4.2 01/06/2025   HGB 13.1 01/06/2025   HCT 39.8 01/06/2025   MCV 91.5 01/06/2025   PLT 184 01/06/2025   Lab Results  Component Value Date   FERRITIN 53 12/09/2024   IRON  56 12/09/2024   TIBC 381 12/09/2024   UIBC 325 12/09/2024   IRONPCTSAT 15 (L) 12/09/2024   Lab Results  Component Value Date   RETICCTPCT 1.4 06/06/2023   RBC 4.35 01/06/2025   RETICCTABS 51.8 06/24/2014   Lab Results  Component Value Date   KPAFRELGTCHN 25.4 (H) 12/09/2024   LAMBDASER 14.6 12/09/2024    KAPLAMBRATIO 1.74 (H) 12/09/2024   Lab Results  Component Value Date   IGGSERUM 727 12/09/2024   IGA 120 12/09/2024   IGMSERUM 15 12/09/2024   Lab Results  Component Value Date   TOTALPROTELP 6.0 12/09/2024   ALBUMINELP 3.7 12/09/2024   A1GS 0.2 12/09/2024   A2GS 0.7 12/09/2024   BETS 0.8 12/09/2024   BETA2SER 0.4 09/16/2015   GAMS 0.6 12/09/2024   MSPIKE Not Observed 12/09/2024   SPEI Comment 06/06/2023     Chemistry      Component Value Date/Time   NA 136 12/09/2024 1026   NA 133 (L) 01/12/2020 1015   NA 142 10/03/2017 0910   NA 137 04/03/2017 0852   K 4.5 12/09/2024 1026   K 3.9 10/03/2017 0910   K 4.2  04/03/2017 0852   CL 103 12/09/2024 1026   CL 103 10/03/2017 0910   CL 108 (H) 05/15/2013 0912   CO2 22 12/09/2024 1026   CO2 28 10/03/2017 0910   CO2 26 04/03/2017 0852   BUN 31 (H) 12/09/2024 1026   BUN 25 01/12/2020 1015   BUN 13 10/03/2017 0910   BUN 16.3 04/03/2017 0852   CREATININE 1.84 (H) 12/09/2024 1026   CREATININE 1.07 11/16/2017 1437   CREATININE 1.0 04/03/2017 0852      Component Value Date/Time   CALCIUM  9.0 12/09/2024 1026   CALCIUM  9.3 10/03/2017 0910   CALCIUM  9.0 04/03/2017 0852   ALKPHOS 87 12/09/2024 1026   ALKPHOS 51 10/03/2017 0910   ALKPHOS 46 04/03/2017 0852   AST 21 12/09/2024 1026   AST 18 04/03/2017 0852   ALT 29 12/09/2024 1026   ALT 34 10/03/2017 0910   ALT 19 04/03/2017 0852   BILITOT 0.3 12/09/2024 1026   BILITOT 0.38 04/03/2017 0852       Impression and Plan: Michael Wiggins is 79 yo gentleman with IgG kappa smoldering myeloma. He has done really well on Faspro/Velcade /Revlimid /Dex treatment so far.  We will proceed with treatment today as planned.  Myeloma studies are pending.  Follow-up in 1 month.    Lauraine Pepper, NP 1/20/202611:17 AM     [1]  Allergies Allergen Reactions   Atorvastatin  Other (See Comments)     hard to focus   Rosuvastatin Other (See Comments)    unable to focus   Amlodipine  Swelling   "

## 2025-01-07 LAB — PROTEIN ELECTROPHORESIS, SERUM, WITH REFLEX
A/G Ratio: 1.4 (ref 0.7–1.7)
Albumin ELP: 3.6 g/dL (ref 2.9–4.4)
Alpha-1-Globulin: 0.2 g/dL (ref 0.0–0.4)
Alpha-2-Globulin: 0.8 g/dL (ref 0.4–1.0)
Beta Globulin: 0.9 g/dL (ref 0.7–1.3)
Gamma Globulin: 0.7 g/dL (ref 0.4–1.8)
Globulin, Total: 2.5 g/dL (ref 2.2–3.9)
Total Protein ELP: 6.1 g/dL (ref 6.0–8.5)

## 2025-01-07 LAB — IGG, IGA, IGM
IgA: 125 mg/dL (ref 61–437)
IgG (Immunoglobin G), Serum: 700 mg/dL (ref 603–1613)
IgM (Immunoglobulin M), Srm: 12 mg/dL — ABNORMAL LOW (ref 15–143)

## 2025-01-07 LAB — KAPPA/LAMBDA LIGHT CHAINS
Kappa free light chain: 25.7 mg/L — ABNORMAL HIGH (ref 3.3–19.4)
Kappa, lambda light chain ratio: 1.65 (ref 0.26–1.65)
Lambda free light chains: 15.6 mg/L (ref 5.7–26.3)

## 2025-01-08 NOTE — Progress Notes (Signed)
 Remote PPM Transmission

## 2025-01-16 ENCOUNTER — Encounter: Payer: Self-pay | Admitting: Family Medicine

## 2025-01-20 ENCOUNTER — Other Ambulatory Visit: Payer: Self-pay

## 2025-01-20 DIAGNOSIS — C9000 Multiple myeloma not having achieved remission: Secondary | ICD-10-CM

## 2025-01-20 DIAGNOSIS — C9002 Multiple myeloma in relapse: Secondary | ICD-10-CM

## 2025-01-20 MED ORDER — LENALIDOMIDE 10 MG PO CAPS: ORAL_CAPSULE | ORAL | 0 refills | Status: AC

## 2025-02-03 ENCOUNTER — Inpatient Hospital Stay

## 2025-02-03 ENCOUNTER — Inpatient Hospital Stay: Admitting: Hematology & Oncology

## 2025-02-19 ENCOUNTER — Ambulatory Visit: Admitting: Cardiology

## 2025-02-25 ENCOUNTER — Ambulatory Visit: Admitting: Family Medicine

## 2025-03-04 ENCOUNTER — Ambulatory Visit: Admitting: Family Medicine

## 2025-03-30 ENCOUNTER — Other Ambulatory Visit

## 2025-04-02 ENCOUNTER — Ambulatory Visit

## 2025-07-02 ENCOUNTER — Ambulatory Visit

## 2025-10-01 ENCOUNTER — Ambulatory Visit
